# Patient Record
Sex: Female | Born: 1955
Health system: Southern US, Community
[De-identification: ages and names within clinical notes are randomized; demographics above are authoritative.]

## PROBLEM LIST (undated history)

## (undated) DIAGNOSIS — K449 Diaphragmatic hernia without obstruction or gangrene: Secondary | ICD-10-CM

## (undated) DIAGNOSIS — M1990 Unspecified osteoarthritis, unspecified site: Secondary | ICD-10-CM

## (undated) DIAGNOSIS — K219 Gastro-esophageal reflux disease without esophagitis: Secondary | ICD-10-CM

## (undated) DIAGNOSIS — T7840XA Allergy, unspecified, initial encounter: Secondary | ICD-10-CM

## (undated) DIAGNOSIS — K279 Peptic ulcer, site unspecified, unspecified as acute or chronic, without hemorrhage or perforation: Secondary | ICD-10-CM

## (undated) DIAGNOSIS — I10 Essential (primary) hypertension: Secondary | ICD-10-CM

## (undated) DIAGNOSIS — K7689 Other specified diseases of liver: Secondary | ICD-10-CM

## (undated) DIAGNOSIS — K284 Chronic or unspecified gastrojejunal ulcer with hemorrhage: Secondary | ICD-10-CM

## (undated) DIAGNOSIS — N281 Cyst of kidney, acquired: Secondary | ICD-10-CM

## (undated) DIAGNOSIS — R519 Headache, unspecified: Secondary | ICD-10-CM

## (undated) DIAGNOSIS — D649 Anemia, unspecified: Secondary | ICD-10-CM

## (undated) DIAGNOSIS — M792 Neuralgia and neuritis, unspecified: Secondary | ICD-10-CM

## (undated) DIAGNOSIS — H269 Unspecified cataract: Secondary | ICD-10-CM

## (undated) DIAGNOSIS — R112 Nausea with vomiting, unspecified: Secondary | ICD-10-CM

## (undated) DIAGNOSIS — E7800 Pure hypercholesterolemia, unspecified: Secondary | ICD-10-CM

## (undated) DIAGNOSIS — J302 Other seasonal allergic rhinitis: Secondary | ICD-10-CM

## (undated) DIAGNOSIS — Z9889 Other specified postprocedural states: Secondary | ICD-10-CM

## (undated) DIAGNOSIS — R0600 Dyspnea, unspecified: Secondary | ICD-10-CM

## (undated) DIAGNOSIS — M810 Age-related osteoporosis without current pathological fracture: Secondary | ICD-10-CM

## (undated) DIAGNOSIS — K274 Chronic or unspecified peptic ulcer, site unspecified, with hemorrhage: Secondary | ICD-10-CM

## (undated) DIAGNOSIS — D72829 Elevated white blood cell count, unspecified: Secondary | ICD-10-CM

## (undated) HISTORY — DX: Unspecified cataract: H26.9

## (undated) HISTORY — DX: Gastro-esophageal reflux disease without esophagitis: K21.9

## (undated) HISTORY — DX: Pure hypercholesterolemia, unspecified: E78.00

## (undated) HISTORY — DX: Other seasonal allergic rhinitis: J30.2

## (undated) HISTORY — PX: WISDOM TOOTH EXTRACTION: SHX21

## (undated) HISTORY — PX: FRACTURE SURGERY: SHX138

## (undated) HISTORY — DX: Allergy, unspecified, initial encounter: T78.40XA

## (undated) HISTORY — DX: Chronic or unspecified gastrojejunal ulcer with hemorrhage: K28.4

## (undated) HISTORY — DX: Neuralgia and neuritis, unspecified: M79.2

## (undated) HISTORY — DX: Peptic ulcer, site unspecified, unspecified as acute or chronic, without hemorrhage or perforation: K27.9

## (undated) HISTORY — DX: Cyst of kidney, acquired: N28.1

## (undated) HISTORY — DX: Unspecified osteoarthritis, unspecified site: M19.90

## (undated) HISTORY — DX: Essential (primary) hypertension: I10

## (undated) HISTORY — DX: Other specified diseases of liver: K76.89

## (undated) HISTORY — DX: Chronic or unspecified peptic ulcer, site unspecified, with hemorrhage: K27.4

## (undated) HISTORY — DX: Diaphragmatic hernia without obstruction or gangrene: K44.9

## (undated) HISTORY — DX: Age-related osteoporosis without current pathological fracture: M81.0

---
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
## 1979-08-03 HISTORY — PX: LAPAROSCOPY: SHX197

## 1990-08-02 HISTORY — PX: APPENDECTOMY: SHX54

## 1990-08-02 HISTORY — PX: TOTAL ABDOMINAL HYSTERECTOMY: SHX209

## 2000-06-12 HISTORY — PX: TIBIA FRACTURE SURGERY: SHX806

## 2000-11-23 HISTORY — PX: LEG SURGERY: SHX1003

## 2001-04-12 HISTORY — PX: KNEE ARTHROSCOPY: SUR90

## 2001-08-02 HISTORY — PX: BREAST CYST ASPIRATION: SHX578

## 2002-02-08 HISTORY — PX: LEG SURGERY: SHX1003

## 2003-01-29 HISTORY — PX: MENISCUS REPAIR: SHX5179

## 2012-08-02 HISTORY — PX: COLONOSCOPY: SHX174

## 2016-09-10 ENCOUNTER — Encounter: Payer: Self-pay | Admitting: Physician Assistant

## 2016-09-16 ENCOUNTER — Encounter: Payer: Self-pay | Admitting: Physician Assistant

## 2016-09-16 ENCOUNTER — Ambulatory Visit (INDEPENDENT_AMBULATORY_CARE_PROVIDER_SITE_OTHER): Payer: Self-pay | Admitting: Physician Assistant

## 2016-09-16 VITALS — BP 128/68 | HR 74 | Ht 67.0 in | Wt 169.0 lb

## 2016-09-16 DIAGNOSIS — R935 Abnormal findings on diagnostic imaging of other abdominal regions, including retroperitoneum: Secondary | ICD-10-CM

## 2016-09-16 DIAGNOSIS — Z8711 Personal history of peptic ulcer disease: Secondary | ICD-10-CM

## 2016-09-16 DIAGNOSIS — K219 Gastro-esophageal reflux disease without esophagitis: Secondary | ICD-10-CM

## 2016-09-16 DIAGNOSIS — R1310 Dysphagia, unspecified: Secondary | ICD-10-CM

## 2016-09-16 MED ORDER — OMEPRAZOLE 40 MG PO CPDR: 40.0000 mg | DELAYED_RELEASE_CAPSULE | Freq: Every day | ORAL | 5 refills | Status: DC

## 2016-09-16 NOTE — Patient Instructions (Signed)
You have been scheduled for an endoscopy. Please follow written instructions given to you at your visit today. If you use inhalers (even only as needed), please bring them with you on the day of your procedure. Your physician has requested that you go to www.startemmi.com and enter the access code given to you at your visit today. This web site gives a general overview about your procedure. However, you should still follow specific instructions given to you by our office regarding your preparation for the procedure.  We have sent the following medications to your pharmacy for you to pick up at your convenience: Omeprazole 40 mg daily.   We have given you an anti rflux handout.

## 2016-09-16 NOTE — Progress Notes (Addendum)
Chief Complaint: Abnormal Ct abdomen, Abdominal pain, H/o ulcers  HPI:  Tracy Mckenzie is a 61 year old Caucasian female who presents to clinic today accompanied by her husband with a past medical history of arthritis, hiatal hernia and stomach ulcers, who was referred to our office after recently being seen in the ED for complaint of abdominal pain and a finding of duodenal ulcers.     Per review of chart patient was recently seen at [facility] on 09/09/16 for complaint of abdominal pain. At that time she reported left lower quadrant abdominal pain and nausea and vomiting. She had labs which showed a white blood cell count elevated at 18.3 and was otherwise normal and a CMP within normal limits. She did have a CT abdomen pelvis at that time which showed wall thickening and surrounding fat stranding involving the first and second portion of the duodenum as well as a small hiatal hernia which was suggestive of duodenitis/peptic ulcer disease. Also finding of the left hepatic cyst.   Today, the patient explains that in July of last year she was told to try and titrate off of her Omeprazole, so she started to do so, she was using 40 mg daily, and as she started titrated she had an increase in reflux symptoms. She was told by someone that it was bad for her to be on a PPI, she tells me this was one of her doctors, she was "eating Tums like candy", but remained off of this medication. She then developed acute abdominal pain in February as above. Patient tells me she went to the ER and was diagnosed with an ulcer. She does tell me she has history of the same back in 2011. Patient does not remember having an EGD but is "sure I had one to diagnose that". Currently the patient has been on Carafate 3 times a day, famotidine 20 mg twice a day and omeprazole 40 mg daily and has had no further abdominal pain or symptoms.   She also tells me today that she has symptoms of dysphagia which occur "more than I want to  say". Her husband does snicker to the side of her when I asked how often this occurs.    Patient's social history is positive for a lot of stress recently with deaths in her family over the past few months.   Patient denies fever, chills, blood in her stool, melena, weight loss, fatigue, anorexia, change in bowel habits, nausea, vomiting or symptoms that awaken her at night.  Past Medical History:  Diagnosis Date  . Arthritis   . Elevated cholesterol   . Hiatal hernia   . High blood pressure   . History of stomach ulcers     Past Surgical History:  Procedure Laterality Date  . APPENDECTOMY  1992  . HYSTERECTOMY ABDOMINAL WITH SALPINGECTOMY  1992  . LEG SURGERY Right 2001   Multiple surgeries    Current Outpatient Prescriptions  Medication Sig Dispense Refill  . omeprazole (PRILOSEC) 40 MG capsule Take 1 capsule (40 mg total) by mouth daily. 30 mins before breakfast 30 capsule 5   No current facility-administered medications for this visit.     Allergies as of 09/16/2016 - never reviewed  Allergen Reaction Noted  . Percocet [oxycodone-acetaminophen] Itching 09/16/2016  . Vicodin [hydrocodone-acetaminophen] Itching 09/16/2016  . Demerol [meperidine] Palpitations 09/16/2016    Family History: No family history of colon cancer  Social History   Social History  . Marital status: Married  Spouse name: N/A  . Number of children: N/A  . Years of education: N/A   Occupational History  . Not on file.   Social History Main Topics  . Smoking status: Never Smoker  . Smokeless tobacco: Never Used  . Alcohol use Not on file  . Drug use: Unknown  . Sexual activity: Not on file   Other Topics Concern  . Not on file   Social History Narrative  . No narrative on file    Review of Systems:    Constitutional: No weight loss, fever, chills, weakness or fatigue Skin: No rash  Cardiovascular: No chest pain   Respiratory: No SOB  Gastrointestinal: See HPI and otherwise  negative Genitourinary: No dysuria or change in urinary frequency Neurological: No headache, dizziness or syncope Musculoskeletal: Positive for arthritis, muscle cramps and back pain Hematologic: No bleeding or bruising Psychiatric: No history of depression or anxiety   Physical Exam:  Vital signs: BP 128/68   Pulse 74   Ht 5\' 7"  (1.702 m)   Wt 169 lb (76.7 kg)   BMI 26.47 kg/m   Constitutional:   Pleasant Caucasian female appears to be in NAD, Well developed, Well nourished, alert and cooperative Head:  Normocephalic and atraumatic. Eyes:   PEERL, EOMI. No icterus. Conjunctiva pink. Ears:  Normal auditory acuity. Neck:  Supple Throat: Oral cavity and pharynx without inflammation, swelling or lesion.  Respiratory: Respirations even and unlabored. Lungs clear to auscultation bilaterally.   No wheezes, crackles, or rhonchi.  Cardiovascular: Normal S1, S2. No MRG. Regular rate and rhythm. No peripheral edema, cyanosis or pallor.  Gastrointestinal:  Soft, nondistended, nontender. No rebound or guarding. Normal bowel sounds. No appreciable masses or hepatomegaly. Rectal:  Not performed.  Msk:  Symmetrical without gross deformities. Without edema, no deformity or joint abnormality.  Neurologic:  Alert and  oriented x4;  grossly normal neurologically.  Skin:   Dry and intact without significant lesions or rashes. Psychiatric:Demonstrates good judgement and reason without abnormal affect or behaviors.  Assessment: 1. Abnormal CT of the abdomen: Completed 09/09/16 showing duodenitis/peptic ulcer disease with inflammatory changes of the first and second portion of the duodenum, left hepatic cyst and a small hiatal hernia, patient was having abdominal pain at that time which has gotten better with Famotidine 20 mg twice a day, Omeprazole 20 mg daily and Sucralfate 3 times a day 2. GERD: Patient does have symptoms if not on maintenance medication 3. History of ulcers: Patient was diagnosed in  2011 with bleeding ulcers 4. Dysphagia: This occurs more than the patient will tell me as her husband snickers when I asked how often this occurs, patient notes that she feels like things "just get stuck", or "go down the wrong hole", a lot; consider esophageal ring versus web versus mass versus stricture versus abnormal motility versus other  Plan: 1. Scheduled patient for an EGD with possible dilation in the [place] with  Dr. Loletha Carrow, as he is the supervising physician this afternoon. Did discuss risks, benefits, limitations and alternatives of the procedure and the patient agrees to proceed. 2. Recommend the patient stay on Omeprazole 40 mg daily, 30-60 minutes before eating breakfast. 3. Reviewed antireflux diet and lifestyle modifications. Provide the patient a handout 4. Recommend patient continue Carafate for the full prescription, no need to refill this, we did discuss timing of this one hour before or 2 hours after eating and other medications. 5. Patient may continue famotidine twice daily for now, but can also discontinue this  when she runs out at the end of the month 6. Patient to follow in clinic per recommendations from Dr. Loletha Carrow after time of procedure  Tracy Newer, PA-C [facility] 09/16/2016, 3:59 PM  Cc: No ref. provider found   Thank you for sending this case to me. I have reviewed the entire note, and the outlined plan seems appropriate.   Wilfrid Lund, MD

## 2016-10-04 ENCOUNTER — Encounter: Payer: Self-pay | Admitting: Gastroenterology

## 2016-10-04 ENCOUNTER — Ambulatory Visit (AMBULATORY_SURGERY_CENTER): Payer: Self-pay | Admitting: Gastroenterology

## 2016-10-04 VITALS — BP 129/71 | HR 68 | Temp 98.4°F | Resp 12 | Ht 67.0 in | Wt 169.0 lb

## 2016-10-04 DIAGNOSIS — K259 Gastric ulcer, unspecified as acute or chronic, without hemorrhage or perforation: Secondary | ICD-10-CM

## 2016-10-04 DIAGNOSIS — K2950 Unspecified chronic gastritis without bleeding: Secondary | ICD-10-CM

## 2016-10-04 DIAGNOSIS — R933 Abnormal findings on diagnostic imaging of other parts of digestive tract: Secondary | ICD-10-CM

## 2016-10-04 DIAGNOSIS — R1310 Dysphagia, unspecified: Secondary | ICD-10-CM

## 2016-10-04 MED ORDER — SODIUM CHLORIDE 0.9 % IV SOLN: 500.0000 mL | INTRAVENOUS | Status: DC

## 2016-10-04 NOTE — Progress Notes (Signed)
Report given to PACU, vss 

## 2016-10-04 NOTE — Progress Notes (Signed)
Called to room to assist during endoscopic procedure.  Patient ID and intended procedure confirmed with present staff. Received instructions for my participation in the procedure from the performing physician.  

## 2016-10-04 NOTE — Patient Instructions (Signed)
YOU HAD AN ENDOSCOPIC PROCEDURE TODAY AT [facility]:   Refer to the procedure report that was given to you for any specific questions about what was found during the examination.  If the procedure report does not answer your questions, please call your gastroenterologist to clarify.  If you requested that your care partner not be given the details of your procedure findings, then the procedure report has been included in a sealed envelope for you to review at your convenience later.  YOU SHOULD EXPECT: Some feelings of bloating in the abdomen. Passage of more gas than usual.  Walking can help get rid of the air that was put into your GI tract during the procedure and reduce the bloating. If you had a lower endoscopy (such as a colonoscopy or flexible sigmoidoscopy) you may notice spotting of blood in your stool or on the toilet paper. If you underwent a bowel prep for your procedure, you may not have a normal bowel movement for a few days.  Please Note:  You might notice some irritation and congestion in your nose or some drainage.  This is from the oxygen used during your procedure.  There is no need for concern and it should clear up in a day or so.  SYMPTOMS TO REPORT IMMEDIATELY:     Following upper endoscopy (EGD)  Vomiting of blood or coffee ground material  New chest pain or pain under the shoulder blades  Painful or persistently difficult swallowing  New shortness of breath  Fever of 100F or higher  Black, tarry-looking stools  For urgent or emergent issues, a gastroenterologist can be reached at any hour by calling [phone].   DIET:  We do recommend a small meal at first, but then you may proceed to your regular diet.  Drink plenty of fluids but you should avoid alcoholic beverages for 24 hours.  ACTIVITY:  You should plan to take it easy for the rest of today and you should NOT DRIVE or use heavy machinery until tomorrow (because of the sedation medicines  used during the test).    FOLLOW UP: Our staff will call the number listed on your records the next business day following your procedure to check on you and address any questions or concerns that you may have regarding the information given to you following your procedure. If we do not reach you, we will leave a message.  However, if you are feeling well and you are not experiencing any problems, there is no need to return our call.  We will assume that you have returned to your regular daily activities without incident.  If any biopsies were taken you will be contacted by phone or by letter within the next 1-3 weeks.  Please call us at [phone] if you have not heard about the biopsies in 3 weeks.    SIGNATURES/CONFIDENTIALITY: You and/or your care partner have signed paperwork which will be entered into your electronic medical record.  These signatures attest to the fact that that the information above on your After Visit Summary has been reviewed and is understood.  Full responsibility of the confidentiality of this discharge information lies with you and/or your care-partner.   Resume medications. Information given on Hiatal Hernia.

## 2016-10-04 NOTE — Op Note (Signed)
[facility] Patient Name: Tracy Mckenzie Procedure Date: 10/04/2016 9:59 AM MRN: [id number] Endoscopist: Mallie Mussel L. Loletha Carrow , MD Age: 61 Referring MD:  Date of Birth: Jan 23, 1956 Gender: Female Account #: [id number] Procedure:                Upper GI endoscopy Indications:              Dysphagia, Heartburn, Suspected chronic duodenal                            ulcer with obstruction (based on recent CT scan                            findings) Medicines:                Monitored Anesthesia Care Procedure:                Pre-Anesthesia Assessment:                           - Prior to the procedure, a History and Physical                            was performed, and patient medications and                            allergies were reviewed. The patient's tolerance of                            previous anesthesia was also reviewed. The risks                            and benefits of the procedure and the sedation                            options and risks were discussed with the patient.                            All questions were answered, and informed consent                            was obtained. Prior Anticoagulants: The patient has                            taken no previous anticoagulant or antiplatelet                            agents. ASA Grade Assessment: II - A patient with                            mild systemic disease. After reviewing the risks                            and benefits, the patient was deemed in  satisfactory condition to undergo the procedure.                           After obtaining informed consent, the endoscope was                            passed under direct vision. Throughout the                            procedure, the patient's blood pressure, pulse, and                            oxygen saturations were monitored continuously. The                            Model GIF-HQ190 [id number]) scope was introduced                             through the mouth, and advanced to the second part                            of duodenum. The upper GI endoscopy was                            accomplished without difficulty. The patient                            tolerated the procedure well. Scope In: Scope Out: Findings:                 The larynx was normal.                           A medium-sized hiatal hernia was present. No                            stricture, web or other stenosis was seen.                           Two non-bleeding superficial gastric ulcers were                            found in the prepyloric region of the stomach. The                            largest lesion was 2 mm in largest dimension.                            Several biopsies were obtained in the gastric body                            and in the gastric antrum with cold forceps for                            histology.  The cardia and gastric fundus were normal on                            retroflexion.                           A mild post-ulcer deformity was found in the                            duodenal bulb. Complications:            No immediate complications. Estimated Blood Loss:     Estimated blood loss: none. Impression:               - Normal larynx.                           - Medium-sized hiatal hernia.                           - Non-bleeding gastric ulcers.                           - Duodenal deformity.                           - Several biopsies were obtained in the gastric                            body and in the gastric antrum. Recommendation:           - Patient has a contact number available for                            emergencies. The signs and symptoms of potential                            delayed complications were discussed with the                            patient. Return to normal activities tomorrow.                            Written discharge  instructions were provided to the                            patient.                           - Resume previous diet.                           - Continue present medications.                           - Await pathology results.                           - Follow up  at office in 3-4 weeks. Vielka Klinedinst L. Loletha Carrow, MD 10/04/2016 10:27:41 AM This report has been signed electronically.

## 2016-10-05 ENCOUNTER — Telehealth: Payer: Self-pay | Admitting: *Deleted

## 2016-10-05 NOTE — Telephone Encounter (Signed)
  Follow up Call-  Call back number 10/04/2016  Post procedure Call Back phone  # [phone]  Permission to leave phone message No     Patient questions:  Do you have a fever, pain , or abdominal swelling? No. Pain Score  0 *  Have you tolerated food without any problems? Yes.    Have you been able to return to your normal activities? Yes.    Do you have any questions about your discharge instructions: Diet   No. Medications  No. Follow up visit  No.  Do you have questions or concerns about your Care? Yes.    Actions: * If pain score is 4 or above: No action needed, pain <4.

## 2016-10-08 ENCOUNTER — Encounter: Payer: Self-pay | Admitting: Gastroenterology

## 2016-10-28 ENCOUNTER — Encounter: Payer: Self-pay | Admitting: Gastroenterology

## 2016-10-28 ENCOUNTER — Ambulatory Visit (INDEPENDENT_AMBULATORY_CARE_PROVIDER_SITE_OTHER): Payer: Self-pay | Admitting: Gastroenterology

## 2016-10-28 ENCOUNTER — Encounter (INDEPENDENT_AMBULATORY_CARE_PROVIDER_SITE_OTHER): Payer: Self-pay

## 2016-10-28 VITALS — BP 108/60 | HR 80 | Ht 66.0 in | Wt 164.5 lb

## 2016-10-28 DIAGNOSIS — K449 Diaphragmatic hernia without obstruction or gangrene: Secondary | ICD-10-CM

## 2016-10-28 DIAGNOSIS — K219 Gastro-esophageal reflux disease without esophagitis: Secondary | ICD-10-CM

## 2016-10-28 DIAGNOSIS — K259 Gastric ulcer, unspecified as acute or chronic, without hemorrhage or perforation: Secondary | ICD-10-CM

## 2016-10-28 DIAGNOSIS — T39395A Adverse effect of other nonsteroidal anti-inflammatory drugs [NSAID], initial encounter: Secondary | ICD-10-CM

## 2016-10-28 NOTE — Patient Instructions (Signed)
If you are age 63 or older, your body mass index should be between 23-30. Your Body mass index is 26.55 kg/m. If this is out of the aforementioned range listed, please consider follow up with your Primary Care Provider.  If you are age 52 or younger, your body mass index should be between 19-25. Your Body mass index is 26.55 kg/m. If this is out of the aformentioned range listed, please consider follow up with your Primary Care Provider.   Thank you for choosing [facility]  Dr Wilfrid Lund III

## 2016-10-28 NOTE — Progress Notes (Signed)
     [facility] Progress Note  Chief Complaint: GERD and gastric ulcer  Subjective  History:  Tracy Mckenzie follows up after her upper endoscopy. She had years of frequent GERD symptoms requiring twice daily PPI. There is an episode of acute lower abdominal pain prompting at trip to the ED. CT scan suggested perhaps inflammation or ulcer in the second portion of the duodenum. She was taking meloxicam more frequent, continues to do so less frequently now. EGD showed nonerosive reflux, small hiatal hernia, and 2 diminutive prepyloric ulcers. There was also evidence of a previous ulcer in the duodenal bulb with mucosal scarring. She is feeling well at this point with no abdominal pain. She has been taking omeprazole 40 mg each evening to prevent nocturnal pyrosis. She still feels that she needs a 20 mg OTC omeprazole dose in the morning to control daytime symptoms much of the time. She denies dysphagia, nausea, vomiting early satiety or weight loss.  ROS: Cardiovascular:  no chest pain Respiratory: no dyspnea  The patient's Past Medical, Family and Social History were reviewed and are on file in the EMR.  Objective:  Med list reviewed  Vital signs in last 24 hrs: Vitals:   10/28/16 0836  BP: 108/60  Pulse: 80    Physical Exam    HEENT: sclera anicteric, oral mucosa moist without lesions  Neck: supple, no thyromegaly, JVD or lymphadenopathy  Cardiac: RRR without murmurs, S1S2 heard, no peripheral edema  Pulm: clear to auscultation bilaterally, normal RR and effort noted  Abdomen: soft, no tenderness, with active bowel sounds. No guarding or palpable hepatosplenomegaly.  Skin; warm and dry, no jaundice or rash  Recent Labs:  Bx negative for H. pylori   @ASSESSMENTPLANBEGIN @ Assessment: Encounter Diagnoses  Name Primary?  . Gastric ulcer due to nonsteroidal anti-inflammatory drug (NSAID) Yes  . Gastroesophageal reflux disease without esophagitis   . Hiatal hernia      I do not know how far into the past she truly had a duodenal ulcer, but at this point it has clearly healed. The small prepyloric ulcers are of little or no clinical significance at present. However, I still think she should minimize NSAID use. She feels that she needs a daily to be functional with her osteoarthritis. I asked her to discuss with primary care the possibility medicine such as Tylenol No. 3 to take perhaps on alternate days in order to minimize her NSAID use.  Plan: She will continue current dosing of PPI, no further testing is needed and she will see me as needed.  Total time 15 minutes, over half spent in counseling and coordinate and care. We discussed the nature of GERD, the use and limitations of medicines, nonpharmacologic antireflux measures, and the effect of NSAIDs on upper digestive ulcers.  Tracy Mckenzie

## 2017-03-20 ENCOUNTER — Other Ambulatory Visit: Payer: Self-pay | Admitting: Physician Assistant

## 2017-05-03 ENCOUNTER — Other Ambulatory Visit: Payer: Self-pay | Admitting: Physician Assistant

## 2017-06-01 ENCOUNTER — Other Ambulatory Visit: Payer: Self-pay | Admitting: Physician Assistant

## 2017-09-27 LAB — CBC AND DIFFERENTIAL
HCT: 38 (ref 36–46)
Hemoglobin: 12.4 (ref 12.0–16.0)
Platelets: 363 (ref 150–399)

## 2017-09-27 LAB — TSH: TSH: 1.83 (ref <=5.90)

## 2017-09-27 LAB — LIPID PANEL
Cholesterol: 232 — AB (ref 0–200)
HDL: 53 (ref 35–70)
Triglycerides: 122 (ref 40–160)

## 2017-09-29 LAB — LIPID PANEL: LDL Cholesterol: 155

## 2017-09-29 LAB — CBC AND DIFFERENTIAL: WBC: 13.2

## 2019-03-06 DIAGNOSIS — S9031XA Contusion of right foot, initial encounter: Secondary | ICD-10-CM | POA: Diagnosis not present

## 2019-05-10 DIAGNOSIS — R69 Illness, unspecified: Secondary | ICD-10-CM | POA: Diagnosis not present

## 2019-07-06 ENCOUNTER — Telehealth: Payer: Self-pay | Admitting: Gastroenterology

## 2019-07-06 NOTE — Telephone Encounter (Signed)
Dr. Loletha Carrow, this pt was last seen in 2018.  She called to schedule a colonoscopy.  Previous colonoscopy records from [facility] 10/24/2012 are in [facility] for review.  Please review and advise when she is due.

## 2019-07-09 NOTE — Telephone Encounter (Signed)
Patient will sign medical release form to obtain records.

## 2019-07-09 NOTE — Telephone Encounter (Signed)
All I can see in Care Everywhere from March 2014 is a pathology report showing hyperplastic polyp.  I would need the full colonoscopy procedure report to know that it was a complete exam with a good preparation in order to recommend colonoscopy interval.  If that report can be found and printed for my review, I will be able to do so.

## 2019-07-10 ENCOUNTER — Telehealth: Payer: Self-pay

## 2019-07-10 NOTE — Telephone Encounter (Signed)
Medical records requested by [facility] from [facility]. 07/10/19 vlm

## 2019-07-11 DIAGNOSIS — R69 Illness, unspecified: Secondary | ICD-10-CM | POA: Diagnosis not present

## 2019-07-20 NOTE — Telephone Encounter (Signed)
Colonoscopy report received and will be sent to Dr. Loletha Carrow for review.

## 2019-07-23 NOTE — Telephone Encounter (Signed)
(   for documentation purposes -colonoscopy report dated 06/22/2005 by Dr. Jeanice Lim Complete exam to ileum, excellent preparation.  3 mm transverse colon polyp removed, however no pathology report.  Colonoscopy 10/24/2012 by Dr. Bebe Liter Complete exam to cecum, excellent preparation.  7 mm ascending colon polyp pathology report showing hyperplastic polyp.) ______________________________________________________________________ I reviewed the reports including complete colonoscopy and pathology report from March 2014.  The type of polyp removed on that exam was reportedly hyperplastic, which is not the type considered to be precancerous.  The recommendation in that case would typically be for repeat colonoscopy at a 10-year interval.  However, it is uncommon to see that type of polyp in the part of the colon where it was removed.  Sometimes a precancerous type of polyp called a serrated polyp, which is common in that part of the colon, can look similar under the microscope and therefore be mistaken for hyperplastic polyp.  All that said, I therefore recommend a surveillance colonoscopy when she feels ready to do so.  If she would like to proceed with that time soon, please forward this to [facility] scheduling.   - HD

## 2019-07-23 NOTE — Telephone Encounter (Signed)
Relayed Dr. Corena Pilgrim recommendation to patient.  She is currently driving and will call back to schedule colonoscopy.

## 2019-08-10 ENCOUNTER — Other Ambulatory Visit: Payer: Self-pay

## 2019-08-13 ENCOUNTER — Ambulatory Visit (INDEPENDENT_AMBULATORY_CARE_PROVIDER_SITE_OTHER): Payer: Medicare HMO | Admitting: Family Medicine

## 2019-08-13 ENCOUNTER — Encounter: Payer: Self-pay | Admitting: Family Medicine

## 2019-08-13 VITALS — BP 144/78 | HR 91 | Temp 97.6°F | Ht 66.0 in | Wt 180.2 lb

## 2019-08-13 DIAGNOSIS — R790 Abnormal level of blood mineral: Secondary | ICD-10-CM | POA: Diagnosis not present

## 2019-08-13 DIAGNOSIS — M1731 Unilateral post-traumatic osteoarthritis, right knee: Secondary | ICD-10-CM | POA: Insufficient documentation

## 2019-08-13 DIAGNOSIS — M8580 Other specified disorders of bone density and structure, unspecified site: Secondary | ICD-10-CM | POA: Diagnosis not present

## 2019-08-13 DIAGNOSIS — I10 Essential (primary) hypertension: Secondary | ICD-10-CM | POA: Diagnosis not present

## 2019-08-13 DIAGNOSIS — M545 Low back pain, unspecified: Secondary | ICD-10-CM | POA: Insufficient documentation

## 2019-08-13 DIAGNOSIS — Z114 Encounter for screening for human immunodeficiency virus [HIV]: Secondary | ICD-10-CM

## 2019-08-13 DIAGNOSIS — R69 Illness, unspecified: Secondary | ICD-10-CM | POA: Diagnosis not present

## 2019-08-13 DIAGNOSIS — Z1159 Encounter for screening for other viral diseases: Secondary | ICD-10-CM

## 2019-08-13 DIAGNOSIS — M810 Age-related osteoporosis without current pathological fracture: Secondary | ICD-10-CM | POA: Insufficient documentation

## 2019-08-13 DIAGNOSIS — K449 Diaphragmatic hernia without obstruction or gangrene: Secondary | ICD-10-CM | POA: Insufficient documentation

## 2019-08-13 DIAGNOSIS — G8929 Other chronic pain: Secondary | ICD-10-CM | POA: Diagnosis not present

## 2019-08-13 DIAGNOSIS — Z1231 Encounter for screening mammogram for malignant neoplasm of breast: Secondary | ICD-10-CM | POA: Diagnosis not present

## 2019-08-13 DIAGNOSIS — K219 Gastro-esophageal reflux disease without esophagitis: Secondary | ICD-10-CM | POA: Insufficient documentation

## 2019-08-13 DIAGNOSIS — Z0000 Encounter for general adult medical examination without abnormal findings: Secondary | ICD-10-CM | POA: Diagnosis not present

## 2019-08-13 LAB — MAGNESIUM: Magnesium: 2.2 mg/dL (ref 1.5–2.5)

## 2019-08-13 LAB — LIPID PANEL
Cholesterol: 224 mg/dL — ABNORMAL HIGH (ref 0–200)
HDL: 56.4 mg/dL (ref >39.00)
LDL Cholesterol: 151 mg/dL — ABNORMAL HIGH (ref 0–99)
NonHDL: 168.07
Total CHOL/HDL Ratio: 4
Triglycerides: 85 mg/dL (ref 0.0–149.0)
VLDL: 17 mg/dL (ref 0.0–40.0)

## 2019-08-13 LAB — CBC WITH DIFFERENTIAL/PLATELET
Basophils Absolute: 0.1 10*3/uL (ref 0.0–0.1)
Basophils Relative: 0.4 % (ref 0.0–3.0)
Eosinophils Absolute: 0.3 10*3/uL (ref 0.0–0.7)
Eosinophils Relative: 2.2 % (ref 0.0–5.0)
HCT: 39.1 % (ref 36.0–46.0)
Hemoglobin: 13 g/dL (ref 12.0–15.0)
Lymphocytes Relative: 16 % (ref 12.0–46.0)
Lymphs Abs: 2.3 10*3/uL (ref 0.7–4.0)
MCHC: 33.2 g/dL (ref 30.0–36.0)
MCV: 87.8 fl (ref 78.0–100.0)
Monocytes Absolute: 0.8 10*3/uL (ref 0.1–1.0)
Monocytes Relative: 5.6 % (ref 3.0–12.0)
Neutro Abs: 10.8 10*3/uL — ABNORMAL HIGH (ref 1.4–7.7)
Neutrophils Relative %: 75.8 % (ref 43.0–77.0)
Platelets: 406 10*3/uL — ABNORMAL HIGH (ref 150.0–400.0)
RBC: 4.46 Mil/uL (ref 3.87–5.11)
RDW: 14.2 % (ref 11.5–15.5)
WBC: 14.2 10*3/uL — ABNORMAL HIGH (ref 4.0–10.5)

## 2019-08-13 LAB — MICROALBUMIN / CREATININE URINE RATIO
Creatinine,U: 36.4 mg/dL
Microalb Creat Ratio: 1.9 mg/g (ref 0.0–30.0)
Microalb, Ur: <0.7 mg/dL (ref 0.0–1.9)

## 2019-08-13 LAB — COMPREHENSIVE METABOLIC PANEL
ALT: 15 U/L (ref 0–35)
AST: 16 U/L (ref 0–37)
Albumin: 4.5 g/dL (ref 3.5–5.2)
Alkaline Phosphatase: 159 U/L — ABNORMAL HIGH (ref 39–117)
BUN: 16 mg/dL (ref 6–23)
CO2: 30 mEq/L (ref 19–32)
Calcium: 10.2 mg/dL (ref 8.4–10.5)
Chloride: 102 mEq/L (ref 96–112)
Creatinine, Ser: 0.98 mg/dL (ref 0.40–1.20)
GFR: 56.85 mL/min — ABNORMAL LOW (ref >60.00)
Glucose, Bld: 98 mg/dL (ref 70–99)
Potassium: 4.2 mEq/L (ref 3.5–5.1)
Sodium: 143 mEq/L (ref 135–145)
Total Bilirubin: 0.3 mg/dL (ref 0.2–1.2)
Total Protein: 7.2 g/dL (ref 6.0–8.3)

## 2019-08-13 LAB — TSH: TSH: 2.4 u[IU]/mL (ref 0.35–4.50)

## 2019-08-13 MED ORDER — OMEPRAZOLE 40 MG PO CPDR: DELAYED_RELEASE_CAPSULE | ORAL | 3 refills | Status: DC

## 2019-08-13 MED ORDER — CYCLOBENZAPRINE HCL 10 MG PO TABS: 10.0000 mg | ORAL_TABLET | Freq: Three times a day (TID) | ORAL | 0 refills | Status: DC | PRN

## 2019-08-13 MED ORDER — TRAMADOL-ACETAMINOPHEN 37.5-325 MG PO TABS: 1.0000 | ORAL_TABLET | Freq: Three times a day (TID) | ORAL | 0 refills | Status: DC | PRN

## 2019-08-13 MED ORDER — HYDROCHLOROTHIAZIDE 12.5 MG PO CAPS: 12.5000 mg | ORAL_CAPSULE | Freq: Every day | ORAL | 3 refills | Status: DC

## 2019-08-13 MED ORDER — LOSARTAN POTASSIUM 100 MG PO TABS: 100.0000 mg | ORAL_TABLET | Freq: Every day | ORAL | 3 refills | Status: DC

## 2019-08-13 NOTE — Progress Notes (Signed)
Phone: [phone]  Subjective:  Patient presents today for their Welcome to Medicare Exam, establishing care and chronic medical conditions.   Hypertension: Here for follow up of hypertension.  Currently on losartan and hctz . Home readings range from [id number] diastolic. Takes medication as prescribed and denies any side effects. Exercise includes none. Weight has been stable. Denies any chest pain, headaches, shortness of breath, vision changes, swelling in lower extremities.   GERD: hiatal hernia with EGD in 2018. No barrett's but per GI, lifelong PPI. Needing a refill of this. Symptoms well controlled.   Chronic right knee pain: trauma with hx of 4 surgeries. likely need TKR, but would like to try injections possibly if she can.   Chronic left sided back pain: was told by ortho she has bulging discs, but has never had a MRI just xrays. She states it will sometimes flair. Interested inf muscle relaxer, has never tried PT. No radicular symptoms or weakness.   Osteopenia: hx of fosamax x4 years. Stopped due to GERD. Last bone scan over 5 years ago per patient. No weight bearing activities. Exercise limited due to knee pain.   Preventive Screening-Counseling & Management  Vision screen: 20/25 both eyes, 20/25-R, 20/30-L  Hearing Screening   125Hz  250Hz  500Hz  1000Hz  2000Hz  3000Hz  4000Hz  6000Hz  8000Hz   Right ear:           Left ear:             Visual Acuity Screening   Right eye Left eye Both eyes  Without correction:     With correction: 20//25 20/30 20/25    Advanced directives: yes  Smoking Status: Never Smoker Second Hand Smoking status: No smokers in home  Risk Factors Regular exercise: no Diet: well balanaced, feels that she needs more vegetables  Fall Risk: None  No flowsheet data found. Opioid use history:  no long term opioids use. Uses prn tramadol for her back   Cardiac risk factors:  advanced age (older than 50 for men, 52 for women) yes Hyperlipidemia  yes No diabetes.  Family History: dad-hyperlipidemia   Depression Screen None. PHQ2 0 0 Depression screen PHQ 2/9 08/13/2019  Decreased Interest 0  Down, Depressed, Hopeless 0  PHQ - 2 Score 0    Activities of Daily Living Independent ADLs and IADLs needs some assistance from husband Hearing Difficulties: -patient declines  Cognitive Testing No reported trouble.  None   Normal 3 word recall  List the Names of Other Physician/Practitioners you currently use:   Immunization History  Administered Date(s) Administered  . Influenza, High Dose Seasonal PF 05/10/2019  . Influenza-Unspecified 05/30/2015, 08/01/2017  . Pneumococcal Polysaccharide-23 07/11/2019  . Tdap 05/02/2014  . Zoster 05/31/2015, 06/06/2015   Required Immunizations needed today: none. Will go to pharmacy for shingrix.   Screening tests- up to date Health Maintenance Due  Topic Date Due  . Hepatitis C Screening  06-23-1956  . HIV Screening  01/07/1969  . PAP SMEAR-Modifier  01/08/1975  . MAMMOGRAM  01/08/2004  . COLONOSCOPY  01/08/2004  . DEXA SCAN  01/08/2019    Review of Systems  Constitutional: Negative for chills, fever and malaise/fatigue.  HENT: Negative for hearing loss and sore throat.   Eyes: Negative for blurred vision and double vision.  Respiratory: Negative for cough, shortness of breath and wheezing.   Cardiovascular: Negative for chest pain, palpitations and leg swelling.  Gastrointestinal: Negative for abdominal pain, blood in stool, nausea and vomiting.  Genitourinary: Negative for dysuria and hematuria.  Musculoskeletal: Positive for back pain and joint pain (right knee pain ). Negative for falls.  Skin: Negative for rash.  Neurological: Negative for dizziness and weakness.  Psychiatric/Behavioral: Negative for memory loss and suicidal ideas. The patient is not nervous/anxious and does not have insomnia.      The following were reviewed and entered/updated in  epic: Past Medical History:  Diagnosis Date  . Arthritis   . Bleeding ulcer   . Elevated cholesterol   . Hiatal hernia   . High blood pressure   . Kidney cysts   . Liver cyst   . Nerve pain    L sciatic pain radiating to R  . Peptic ulcer disease    Patient Active Problem List   Diagnosis Date Noted  . Essential hypertension 08/13/2019  . GERD (gastroesophageal reflux disease) 08/13/2019  . Osteopenia 08/13/2019  . Post-traumatic osteoarthritis of right knee 08/13/2019  . Hiatal hernia 08/13/2019  . Chronic left-sided low back pain without sciatica 08/13/2019   Past Surgical History:  Procedure Laterality Date  . APPENDECTOMY  1992  . KNEE ARTHROSCOPY Right 04/12/2001   torn cartilage  . LAPAROSCOPY  1981   for endometriosis  . LEG SURGERY Right 11/23/2000   to remove screws  . LEG SURGERY Right 02/08/2002   to remove rod  . MENISCUS REPAIR  01/29/2003  . TIBIA FRACTURE SURGERY Right 06/12/2000   Multiple surgeries  . TONSILLECTOMY  1967  . TOTAL ABDOMINAL HYSTERECTOMY  1992    Family History  Problem Relation Age of Onset  . Kidney disease Mother   . COPD Mother   . Breast cancer Maternal Aunt   . Breast cancer Paternal Aunt   . COPD Father   . Diabetes Maternal Grandfather   . Stroke Paternal Grandfather   . Colon cancer Neg Hx     Medications- reviewed and updated Current Outpatient Medications  Medication Sig Dispense Refill  . AMBULATORY NON FORMULARY MEDICATION Sciatica Relief OTC Take 2-3 tabs by mouth as needed    . COCONUT OIL PO Take 1,000 mg by mouth daily.     . Coenzyme Q10 (CO Q-10) 100 MG CAPS Take 1 tablet by mouth daily.    . Cyanocobalamin (VITAMIN B-12) 5000 MCG TBDP Take 1 tablet by mouth daily.    . hydrochlorothiazide (MICROZIDE) 12.5 MG capsule Take 1 capsule (12.5 mg total) by mouth daily. 90 capsule 3  . levocetirizine (XYZAL) 5 MG tablet Take 5 mg by mouth daily.    Marland Kitchen lidocaine (LIDODERM) 5 % Place onto the skin as needed.      . magnesium 30 MG tablet Take 3 tablets by mouth daily.    . Misc Natural Products (GLUCOS-CHONDROIT-MSM COMPLEX PO) Take 2 tablets by mouth daily.    Marland Kitchen omeprazole (PRILOSEC) 40 MG capsule TAKE ONE CAPSULE BY MOUTH EVERY DAY 30 MINUTES BEFORE BREAkfast 90 capsule 3  . Potassium 99 MG TABS Take 1 tablet by mouth daily.    . traMADol-acetaminophen (ULTRACET) 37.5-325 MG tablet Take 1 tablet by mouth every 8 (eight) hours as needed. 30 tablet 0  . TURMERIC PO Take 2 tablets by mouth daily.    . cyclobenzaprine (FLEXERIL) 10 MG tablet Take 1 tablet (10 mg total) by mouth 3 (three) times daily as needed for muscle spasms. 30 tablet 0  . losartan (COZAAR) 100 MG tablet Take 1 tablet (100 mg total) by mouth daily. 90 tablet 3   Current Facility-Administered Medications  Medication Dose Route Frequency Provider Last  Rate Last Admin  . 0.9 %  sodium chloride infusion  500 mL Intravenous Continuous Doran Stabler, MD        Allergies-reviewed and updated Allergies  Allergen Reactions  . Meperidine Palpitations and Anaphylaxis    flushed  . Simvastatin     Other reaction(s): Muscle Pain  . Oxycodone-Acetaminophen Itching and Rash  . Vicodin [Hydrocodone-Acetaminophen] Itching    Social History   Socioeconomic History  . Marital status: Married    Spouse name: Not on file  . Number of children: Not on file  . Years of education: Not on file  . Highest education level: Not on file  Occupational History  . Not on file  Tobacco Use  . Smoking status: Never Smoker  . Smokeless tobacco: Never Used  Substance and Sexual Activity  . Alcohol use: No  . Drug use: No  . Sexual activity: Not on file  Other Topics Concern  . Not on file  Social History Narrative  . Not on file   Social Determinants of Health   Financial Resource Strain:   . Difficulty of Paying Living Expenses: Not on file  Food Insecurity:   . Worried About Charity fundraiser in the Last Year: Not on file  . Ran  Out of Food in the Last Year: Not on file  Transportation Needs:   . Lack of Transportation (Medical): Not on file  . Lack of Transportation (Non-Medical): Not on file  Physical Activity:   . Days of Exercise per Week: Not on file  . Minutes of Exercise per Session: Not on file  Stress:   . Feeling of Stress : Not on file  Social Connections:   . Frequency of Communication with Friends and Family: Not on file  . Frequency of Social Gatherings with Friends and Family: Not on file  . Attends Religious Services: Not on file  . Active Member of Clubs or Organizations: Not on file  . Attends Archivist Meetings: Not on file  . Marital Status: Not on file    Objective: BP (!) 144/78 (BP Location: Left Arm, Patient Position: Sitting, Cuff Size: Normal)   Pulse 91   Temp 97.6 F (36.4 C) (Temporal)   Ht 5\' 6"  (1.676 m)   Wt 180 lb 3.2 oz (81.7 kg)   SpO2 99%   BMI 29.09 kg/m  Gen: NAD, resting comfortably HEENT: Mucous membranes are moist. Oropharynx normal Neck: no thyromegaly CV: RRR no murmurs rubs or gallops Lungs: CTAB no crackles, wheeze, rhonchi Abdomen: soft/nontender/nondistended/normal bowel sounds. No rebound or guarding.  Ext: no edema Skin: warm, dry Neuro: grossly normal, moves all extremities, PERRLA. Gait unsteady due to right knee.   Assessment/Plan:  Welcome to Medicare exam completed- discussed recommended screenings anddocumented any personalized health advice and referrals for preventive counseling. See AVS as well which was given to patient.   Status of chronic or acute concerns  1. Welcome to Medicare preventive visit  - EKG 12-Lead  2. Encounter for hepatitis C screening test for low risk patient  - Hepatitis C antibody  3. Encounter for screening for HIV  - HIV antibody  4. Encounter for screening mammogram for breast cancer  - MM Digital Screening; Future  5. Essential hypertension Slightly above goal, but nearly there. Home  readings are to goal. im going to send in losartan 100mg /day instead of her taking 2 pills/day. Will continue with current treatment plan. Would like her to exercise, but very hard  with her knee. F/u in 6 months.  - CBC with Differential/Platelet - Comprehensive metabolic panel - TSH - Lipid panel - Microalbumin / creatinine urine ratio  6. Chronic left-sided low back pain without sciatica PT referral placed. Unsure if her gait instability/posture is contributing due to her back pain. Will do trial of PT and get her into ortho to address her knee. If back not improving, she may need MRI. Will do trial of muscle relaxer prn. Only wants to take at night and will do flexeril. Discussed may make her drowsy and would start off with 1/2 tab. Refilled her tramdol that she takes prn for severe pain. pmp website checked and last filled in 11/2018.  - Ambulatory referral to Physical Therapy  7. Osteopenia, unspecified location Start calcium and vitamin D. Would like her to exercise, but I think this will be limited due to her knee. DEXA ordered.  - DG Bone Density; Future  8. Post-traumatic osteoarthritis of right knee voltaren qid and referral to ortho.  - Ambulatory referral to Orthopedic Surgery  9. Low magnesium level Do not think she needs to take otc magnesium. Will check her level.  - Magnesium   Future Appointments  Date Time Provider [place]  02/13/2020  8:20 AM Orma Flaming, MD [facility]   Return in about 6 months (around 02/10/2020) for htn/labs .   Lab/Order associations: Essential hypertension - Plan: CBC with Differential/Platelet, Comprehensive metabolic panel, TSH, Lipid panel, Microalbumin / creatinine urine ratio  Welcome to Medicare preventive visit - Plan: EKG 12-Lead  Encounter for hepatitis C screening test for low risk patient - Plan: Hepatitis C antibody  Encounter for screening for HIV - Plan: HIV antibody  Encounter for screening mammogram for breast  cancer - Plan: MM Digital Screening  Chronic left-sided low back pain without sciatica - Plan: Ambulatory referral to Physical Therapy  Osteopenia, unspecified location - Plan: DG Bone Density  Post-traumatic osteoarthritis of right knee - Plan: Ambulatory referral to Orthopedic Surgery  Low magnesium level - Plan: Magnesium  Meds ordered this encounter  Medications  . traMADol-acetaminophen (ULTRACET) 37.5-325 MG tablet    Sig: Take 1 tablet by mouth every 8 (eight) hours as needed.    Dispense:  30 tablet    Refill:  0  . cyclobenzaprine (FLEXERIL) 10 MG tablet    Sig: Take 1 tablet (10 mg total) by mouth 3 (three) times daily as needed for muscle spasms.    Dispense:  30 tablet    Refill:  0  . hydrochlorothiazide (MICROZIDE) 12.5 MG capsule    Sig: Take 1 capsule (12.5 mg total) by mouth daily.    Dispense:  90 capsule    Refill:  3  . losartan (COZAAR) 100 MG tablet    Sig: Take 1 tablet (100 mg total) by mouth daily.    Dispense:  90 tablet    Refill:  3  . omeprazole (PRILOSEC) 40 MG capsule    Sig: TAKE ONE CAPSULE BY MOUTH EVERY DAY 30 MINUTES BEFORE BREAkfast    Dispense:  90 capsule    Refill:  3   This visit occurred during the SARS-CoV-2 public health emergency.  Safety protocols were in place, including screening questions prior to the visit, additional usage of staff PPE, and extensive cleaning of exam room while observing appropriate contact time as indicated for disinfecting solutions.   >45 minutes spent in medical decision making.   Return precautions advised. Orma Flaming, MD

## 2019-08-13 NOTE — Patient Instructions (Addendum)
-  I want you to try over the counter voltaren goal for your knee. It's a topical NSAID to keep you off the oral NSAIDs. Sent in tramadol as well.   -muscle relaxer sent in. Would start with 1/2 a tab because can make you really drowsy. Do not want you on this daily. Also did referral to PT for your back.   -all your labs will be done today.   -referral for mammogram and bone scan. They will call you for this.   -referral to ortho for your right knee.   -for your osteopenia: make sure you are on calcium and vitamin D. 1200mg  of calcium and 800-1000IU/day of vitamin. Walking and weight bearing exercises will help this as well. I know this is hard with your knee.   -refilled all of your medication. I would stop the magnesium.   [name] , Thank you for taking time to come for your Medicare Wellness Visit. I appreciate your ongoing commitment to your health goals. Please review the following plan we discussed and let me know if I can assist you in the future.   These are the goals we discussed: Goals   None     This is a list of the screening recommended for you and due dates:  Health Maintenance  Topic Date Due  .  Hepatitis C: One time screening is recommended by Center for Disease Control  (CDC) for  adults born from [birth year] through 1967.   Feb 22, 1956  . HIV Screening  01/07/1969  . Pap Smear  01/08/1975  . Mammogram  01/08/2004  . Colon Cancer Screening  01/08/2004  . DEXA scan (bone density measurement)  01/08/2019  . Pneumonia vaccines (2 of 2 - PCV13) 07/10/2020  . Tetanus Vaccine  05/02/2024  . Flu Shot  Completed

## 2019-08-14 LAB — HEPATITIS C ANTIBODY
Hepatitis C Ab: NONREACTIVE
SIGNAL TO CUT-OFF: 0.01 (ref <1.00)

## 2019-08-14 LAB — HIV ANTIBODY (ROUTINE TESTING W REFLEX): HIV 1&2 Ab, 4th Generation: NONREACTIVE

## 2019-08-15 ENCOUNTER — Encounter: Payer: Self-pay | Admitting: Family Medicine

## 2019-08-15 DIAGNOSIS — E785 Hyperlipidemia, unspecified: Secondary | ICD-10-CM | POA: Insufficient documentation

## 2019-08-17 ENCOUNTER — Telehealth: Payer: Self-pay

## 2019-08-17 NOTE — Telephone Encounter (Signed)
Spoke with patient and advised that we r/c a fax from [employer] stating that Flexeril required a PA, which we do not do.  Advised that she can either pay OOP or contact her insurance to find out which muscle relaxer is preferred under her insurance plan.  She states that she was able to get it for under $10 through [email or web address].  Contacted patient's insurance company to inform them to cancel PA request.

## 2019-08-22 ENCOUNTER — Other Ambulatory Visit: Payer: Self-pay | Admitting: Family Medicine

## 2019-08-22 DIAGNOSIS — D72829 Elevated white blood cell count, unspecified: Secondary | ICD-10-CM

## 2019-08-22 MED ORDER — ROSUVASTATIN CALCIUM 5 MG PO TABS: 5.0000 mg | ORAL_TABLET | Freq: Every day | ORAL | 3 refills | Status: DC

## 2019-08-22 NOTE — Progress Notes (Signed)
[facility]

## 2019-08-27 NOTE — Telephone Encounter (Signed)
Message sent to pt Dr. Rogers Blocker is off this week.

## 2019-08-30 ENCOUNTER — Other Ambulatory Visit: Payer: Self-pay | Admitting: Family Medicine

## 2019-08-30 DIAGNOSIS — Z1231 Encounter for screening mammogram for malignant neoplasm of breast: Secondary | ICD-10-CM

## 2019-08-30 DIAGNOSIS — M8580 Other specified disorders of bone density and structure, unspecified site: Secondary | ICD-10-CM

## 2019-08-30 NOTE — Telephone Encounter (Signed)
Pt has not called back to schedule appt. Records will be kept in the filling cabinet.

## 2019-08-31 LAB — DRUG SCREEN, URINE: .: POSITIVE

## 2019-08-31 LAB — URINALYSIS, COMPLETE

## 2019-09-05 ENCOUNTER — Ambulatory Visit (INDEPENDENT_AMBULATORY_CARE_PROVIDER_SITE_OTHER): Payer: Medicare HMO

## 2019-09-05 ENCOUNTER — Ambulatory Visit: Payer: Medicare HMO | Admitting: Orthopaedic Surgery

## 2019-09-05 ENCOUNTER — Encounter: Payer: Self-pay | Admitting: Orthopaedic Surgery

## 2019-09-05 ENCOUNTER — Telehealth: Payer: Self-pay

## 2019-09-05 ENCOUNTER — Other Ambulatory Visit: Payer: Self-pay

## 2019-09-05 DIAGNOSIS — M4807 Spinal stenosis, lumbosacral region: Secondary | ICD-10-CM

## 2019-09-05 DIAGNOSIS — G8929 Other chronic pain: Secondary | ICD-10-CM | POA: Diagnosis not present

## 2019-09-05 DIAGNOSIS — M1731 Unilateral post-traumatic osteoarthritis, right knee: Secondary | ICD-10-CM

## 2019-09-05 DIAGNOSIS — M545 Low back pain, unspecified: Secondary | ICD-10-CM

## 2019-09-05 DIAGNOSIS — M1711 Unilateral primary osteoarthritis, right knee: Secondary | ICD-10-CM

## 2019-09-05 MED ORDER — METHYLPREDNISOLONE ACETATE 40 MG/ML IJ SUSP
40.0000 mg | INTRAMUSCULAR | Status: AC | PRN
  Administered 2019-09-05: 09:00:00 40 mg via INTRA_ARTICULAR

## 2019-09-05 MED ORDER — LIDOCAINE HCL 1 % IJ SOLN
3.0000 mL | INTRAMUSCULAR | Status: AC | PRN
  Administered 2019-09-05: 09:00:00 3 mL

## 2019-09-05 NOTE — Telephone Encounter (Signed)
Right knee gel injection  

## 2019-09-05 NOTE — Progress Notes (Signed)
Office Visit Note   Patient: Tracy Mckenzie           Date of Birth: [birth year]           MRN: [id number] Visit Date: 09/05/2019              Requested by: Orma Flaming, [address] [place],  [place] [address] PCP: Orma Flaming, MD   Assessment & Plan: Visit Diagnoses:  1. Post-traumatic osteoarthritis of right knee   2. Chronic left-sided low back pain, unspecified whether sciatica present   3. Unilateral primary osteoarthritis, right knee     Plan: From her knee standpoint, I do feel she would benefit from a steroid injection in her knee today and follow this up in 4 weeks now with hyaluronic acid to treat the pain from osteoarthritis.  At some point she may end up needing a knee replacement.  I do feel her lumbar spine needs further work-up given the sciatic symptoms in the radicular symptoms combined with her radiographic findings.  I feel an MRI is medically necessary at this point of the lumbar spine to assess for stenosis and nerve compression.  She agrees with this treatment plan.  She did tolerate the steroid injection well in her right knee.  We will see her back in 4 weeks to go over the MRI of her lumbar spine and to hopefully place hyaluronic acid into the right knee.  All question concerns were answered and addressed.  Follow-Up Instructions: Return in about 4 weeks (around 10/03/2019).   Orders:  Orders Placed This Encounter  Procedures  . Large Joint Inj  . XR Knee 1-2 Views Right  . XR Tibia/Fibula Right  . XR Lumbar Spine 2-3 Views   No orders of the defined types were placed in this encounter.     Procedures: Large Joint Inj: R knee on 09/05/2019 9:08 AM Indications: diagnostic evaluation and pain Details: 22 G 1.5 in needle, superolateral approach  Arthrogram: No  Medications: 3 mL lidocaine 1 %; 40 mg methylPREDNISolone acetate 40 MG/ML Outcome: tolerated well, no immediate complications Procedure, treatment alternatives, risks and benefits  explained, specific risks discussed. Consent was given by the patient. Immediately prior to procedure a time out was called to verify the correct patient, procedure, equipment, support staff and site/side marked as required. Patient was prepped and draped in the usual sterile fashion.       Clinical Data: No additional findings.   Subjective: Chief Complaint  Patient presents with  . Right Knee - Pain  The patient is a very pleasant 64 year old female that I am seeing for the first time.  She has quite a significant history of trauma to her right lower extremity.  Over 15 to 20 years ago she sustained a tib-fib fracture and had a intramedullary nail placed in her right leg.  Eventually she had to have this nail removed.  She has had at least 2 arthroscopic interventions on that right knee due to arthritic changes and meniscal tears of the lateral compartment of her knee.  Her knee does hurt on a daily basis and she does get a lot of locking catching with that knee.  All the pain is lateral.  Without being said she has had debilitating low back pain to the left side for many years now.  Is become worse for her.  It radiates into the sciatic region and down her leg.  She is not a diabetic but does report numbness and tingling  in both her feet as well.  At this point her back pain is detriment affecting her mobility, her quality of life and her actives daily living.  She feels this is gotten worse with time with having her gait difficulties due to dealing with her right knee as well.  She is otherwise healthy individual with a history of high blood pressure.  She is on medications for this.  She last had a steroid injection in her right knee in 2013.  At this point her knee pain and back pain is daily and can be 10 out of 10 at times.  HPI  Review of Systems She currently denies any headache, chest pain, shortness of breath, fever, chills, nausea, vomiting  Objective: Vital Signs: There were no  vitals taken for this visit.  Physical Exam She is alert and orient x3 and in no acute distress Ortho Exam Examination of her right knee does show scarring from previous surgery on the right knee from an arthroscopic standpoint and from where the intramedullary nail was placed.  She has significant lateral joint line tenderness and a positive Murray sign to the lateral compartment of the knee.  There is patellofemoral crepitation as well.  The knee has good range of motion and is ligamentously stable.  She does have pain with flexion extension lumbar spine.  She has significant imitations with extension lumbar spine.  She does have a positive straight leg raise to the left side.  There is numbness subjectively in both feet but good strength in her legs. Specialty Comments:  No specialty comments available.  Imaging: XR Knee 1-2 Views Right  Result Date: 09/05/2019 An AP and lateral of the right knee shows significant arthritic changes of the lateral compartment and the patellofemoral joint.  XR Lumbar Spine 2-3 Views  Result Date: 09/05/2019 2 views of the lumbar spine show severe degenerative changes at multiple levels with degenerative disc disease.  There is loss of her lumbar lordosis.  There is significant stenosis of the foramina at multiple levels  XR Tibia/Fibula Right  Result Date: 09/05/2019 2 views of the tibia and fibula right side show a healed distal third tib-fib fracture.  The bone is osteopenic.    PMFS History: Patient Active Problem List   Diagnosis Date Noted  . Unilateral primary osteoarthritis, right knee 09/05/2019  . Hyperlipidemia 08/15/2019  . Essential hypertension 08/13/2019  . GERD (gastroesophageal reflux disease) 08/13/2019  . Osteopenia 08/13/2019  . Post-traumatic osteoarthritis of right knee 08/13/2019  . Hiatal hernia 08/13/2019  . Chronic left-sided low back pain without sciatica 08/13/2019   Past Medical History:  Diagnosis Date  . Arthritis     . Bleeding ulcer   . Elevated cholesterol   . Hiatal hernia   . High blood pressure   . Kidney cysts   . Liver cyst   . Nerve pain    L sciatic pain radiating to R  . Peptic ulcer disease     Family History  Problem Relation Age of Onset  . Kidney disease Mother   . COPD Mother   . Breast cancer Maternal Aunt   . Breast cancer Paternal Aunt   . COPD Father   . Diabetes Maternal Grandfather   . Stroke Paternal Grandfather   . Colon cancer Neg Hx     Past Surgical History:  Procedure Laterality Date  . APPENDECTOMY  1992  . KNEE ARTHROSCOPY Right 04/12/2001   torn cartilage  . LAPAROSCOPY  1981   for  endometriosis  . LEG SURGERY Right 11/23/2000   to remove screws  . LEG SURGERY Right 02/08/2002   to remove rod  . MENISCUS REPAIR  01/29/2003  . TIBIA FRACTURE SURGERY Right 06/12/2000   Multiple surgeries  . TONSILLECTOMY  1967  . TOTAL ABDOMINAL HYSTERECTOMY  1992   Social History   Occupational History  . Not on file  Tobacco Use  . Smoking status: Never Smoker  . Smokeless tobacco: Never Used  Substance and Sexual Activity  . Alcohol use: No  . Drug use: No  . Sexual activity: Not on file

## 2019-09-06 ENCOUNTER — Ambulatory Visit: Payer: Medicare HMO | Admitting: Physical Therapy

## 2019-09-06 ENCOUNTER — Encounter: Payer: Self-pay | Admitting: Physical Therapy

## 2019-09-06 ENCOUNTER — Telehealth: Payer: Self-pay

## 2019-09-06 DIAGNOSIS — G8929 Other chronic pain: Secondary | ICD-10-CM

## 2019-09-06 DIAGNOSIS — M25561 Pain in right knee: Secondary | ICD-10-CM

## 2019-09-06 DIAGNOSIS — M545 Low back pain, unspecified: Secondary | ICD-10-CM

## 2019-09-06 DIAGNOSIS — M79661 Pain in right lower leg: Secondary | ICD-10-CM | POA: Diagnosis not present

## 2019-09-06 NOTE — Telephone Encounter (Signed)
New start, submit for VOB.

## 2019-09-06 NOTE — Telephone Encounter (Signed)
Submitted for VOB for GEL one right knee

## 2019-09-06 NOTE — Telephone Encounter (Signed)
Submitted

## 2019-09-09 ENCOUNTER — Encounter: Payer: Self-pay | Admitting: Physical Therapy

## 2019-09-09 NOTE — Therapy (Signed)
[facility] [address] [place], [place], [address] Phone: [phone]   Fax:  [phone]  Physical Therapy Evaluation  Patient Details  Name: Tracy Mckenzie MRN: [id number] Date of Birth: 01/27/1956 Referring Provider (PT): Orma Flaming   Encounter Date: 09/06/2019  PT End of Session - 09/09/19 1648    Visit Number  1    Number of Visits  12    Date for PT Re-Evaluation  10/18/19    Authorization Type  [employer]    PT Start Time  1345    PT Stop Time  1425    PT Time Calculation (min)  40 min    Activity Tolerance  Patient tolerated treatment well    Behavior During Therapy  [facility] for tasks assessed/performed       Past Medical History:  Diagnosis Date  . Arthritis   . Bleeding ulcer   . Elevated cholesterol   . Hiatal hernia   . High blood pressure   . Kidney cysts   . Liver cyst   . Nerve pain    L sciatic pain radiating to R  . Peptic ulcer disease     Past Surgical History:  Procedure Laterality Date  . APPENDECTOMY  1992  . KNEE ARTHROSCOPY Right 04/12/2001   torn cartilage  . LAPAROSCOPY  1981   for endometriosis  . LEG SURGERY Right 11/23/2000   to remove screws  . LEG SURGERY Right 02/08/2002   to remove rod  . MENISCUS REPAIR  01/29/2003  . TIBIA FRACTURE SURGERY Right 06/12/2000   Multiple surgeries  . TONSILLECTOMY  1967  . TOTAL ABDOMINAL HYSTERECTOMY  1992    There were no vitals filed for this visit.   Subjective Assessment - 09/09/19 1646    Subjective  Pain reports pain in low back, L, into L glute. Back pain for 10 years. Old injuries to R knee, leg, may be related, she had 2 meniscal surgeries, and 2 lower leg surgeries. She is currently not doing any HEP. did have recent x-rays, lumbar MRI ordered. States most pain on L low back region. She works 2 jobs, up to 12 hr days, does sit most of the time.    Limitations  Lifting;Standing;Walking;House hold activities    Patient Stated Goals   Decreased pain in back and R LE.    Currently in Pain?  Yes    Pain Score  7     Pain Location  Back    Pain Orientation  Left    Pain Descriptors / Indicators  Aching    Pain Type  Chronic pain    Pain Onset  More than a month ago    Pain Frequency  Intermittent    Aggravating Factors   standing    Pain Relieving Factors  sitting         [facility] PT Assessment - 09/09/19 0001      Assessment   Medical Diagnosis  Back pain, R knee pain    Referring Provider (PT)  Orma Flaming    Prior Therapy  no      Balance Screen   Has the patient fallen in the past 6 months  No      Prior Function   Level of Independence  Independent      Cognition   Overall Cognitive Status  Within Functional Limits for tasks assessed      AROM   AROM Assessment Site  Knee    Right/Left Knee  Right  Right Knee Extension  -5   wfl   Right Knee Flexion  --   wfl   Lumbar Flexion  mild limitation    Lumbar Extension  mild limitaiton    Lumbar - Right Side Bend  mild limitation    Lumbar - Left Side Bend  mild limitation      Strength   Overall Strength Comments  R knee: 4/5,  Hips: 4-/5       Palpation   Palpation comment  Pain at L SI, and pain/trigger points into L glute.  Tightness in L paraspinals, Soreness in R lower leg near old scar, Mild pain at med/lat joint line of R knee, Hypomobile R knee for extension.                 Objective measurements completed on examination: See above findings.      [facility] PT Treatment/Exercise - 09/09/19 0001      Exercises   Exercises  Lumbar      Lumbar Exercises: Stretches   Active Hamstring Stretch  2 reps;30 seconds    Active Hamstring Stretch Limitations  seated    Single Knee to Chest Stretch  2 reps;30 seconds;Right;Left    Pelvic Tilt  20 reps    Piriformis Stretch  3 reps;30 seconds    Piriformis Stretch Limitations  supine/modified       Manual Therapy   Manual Therapy  Soft tissue mobilization    Soft tissue  mobilization  STM/DTM to L SI and L glute region             PT Education - 09/09/19 1648    Education Details  PT POC, Exam findings. HEP    Person(s) Educated  Patient    Methods  Explanation;Demonstration;Verbal cues;Tactile cues;Handout    Comprehension  Verbalized understanding;Returned demonstration;Verbal cues required;Need further instruction       PT Short Term Goals - 09/09/19 1651      PT SHORT TERM GOAL #1   Title  Pt to be indepndent with initial HEP    Time  2    Period  Weeks    Status  New    Target Date  09/20/19        PT Long Term Goals - 09/09/19 1651      PT LONG TERM GOAL #1   Title  Pt to be independent with final HEP for back and R LE    Time  6    Period  Weeks    Status  New    Target Date  10/18/19      PT LONG TERM GOAL #2   Title  Pt to report decreased pain in low back to 0-3/10 with standing activity and IADLs.    Time  6    Period  Weeks    Status  New    Target Date  10/18/19      PT LONG TERM GOAL #3   Title  Pt to demo proper mechanics for sit to stand, squat, and lift, to improve safety and pain with IADLs.    Time  6    Period  Weeks    Status  New    Target Date  10/18/19      PT LONG TERM GOAL #4   Title  Pt to demo improved LE strength to at least 4+/5 to improve ability for IADLs and ambulation.    Time  6    Period  Weeks  Status  New    Target Date  10/21/19             Plan - 09/09/19 1654    Clinical Impression Statement  Pt presents with primary complaint of increaesd pain in low back, as well as R knee and leg. Pt with soreness in L SI, and tenderness/trigger points into L glute. She has decreased strength in hips and knees, and lack of effective HEP. She has decreased ability and endurance for standing activity.  She has increased pian with standing and walking, with decreased ability for exercise, IADLS, community activities. Pt to benefit from skilled PT to improve.    Personal Factors and  Comorbidities  Past/Current Experience;Comorbidity 1;Comorbidity 2    Comorbidities  2 knee surgeries, Ankle surgery with complications, Osteopenia, OA,    Examination-Activity Limitations  Locomotion Level;Transfers;Squat;Stairs;Lift;Stand    Examination-Participation Restrictions  Church;Cleaning;Community Activity;Driving;Shop;Laundry;Meal Prep    Stability/Clinical Decision Making  Evolving/Moderate complexity    Clinical Decision Making  Moderate    Rehab Potential  Good    PT Frequency  2x / week    PT Duration  6 weeks    PT Treatment/Interventions  ADLs/Self Care Home Management;Cryotherapy;Electrical Stimulation;Gait training;DME Instruction;Contrast Bath;Fluidtherapy;Ultrasound;Traction;Moist Heat;Iontophoresis 4mg /ml Dexamethasone;Stair training;Functional mobility training;Therapeutic activities;Therapeutic exercise;Balance training;Neuromuscular re-education;Manual techniques;Orthotic Fit/Training;Patient/family education;Passive range of motion;Dry needling;Splinting;Joint Manipulations;Spinal Manipulations;Taping;Vasopneumatic Device    Consulted and Agree with Plan of Care  Patient       Patient will benefit from skilled therapeutic intervention in order to improve the following deficits and impairments:  Abnormal gait, Decreased range of motion, Difficulty walking, Increased muscle spasms, Decreased endurance, Decreased activity tolerance, Pain, Improper body mechanics, Impaired flexibility, Hypomobility, Decreased balance, Decreased mobility, Decreased strength  Visit Diagnosis: Chronic left-sided low back pain without sciatica  Chronic pain of right knee  Pain in right lower leg     Problem List Patient Active Problem List   Diagnosis Date Noted  . Unilateral primary osteoarthritis, right knee 09/05/2019  . Hyperlipidemia 08/15/2019  . Essential hypertension 08/13/2019  . GERD (gastroesophageal reflux disease) 08/13/2019  . Osteopenia 08/13/2019  . Post-traumatic  osteoarthritis of right knee 08/13/2019  . Hiatal hernia 08/13/2019  . Chronic left-sided low back pain without sciatica 08/13/2019   Lyndee Hensen, PT, DPT 4:59 PM  09/09/19    [place], [place], [address] Phone: [phone]   Fax:  [phone]  Name: Tracy Mckenzie MRN: [id number] Date of Birth: [birth year]

## 2019-09-09 NOTE — Patient Instructions (Signed)
Seated hamstring stretch 30 sec x3 bil;  Single knee to chest 30 sec x3  bil  Piriformis , supine fig 4 , 30 sec x3 bil;  Pelvic tilts x20

## 2019-09-11 ENCOUNTER — Telehealth: Payer: Self-pay

## 2019-09-11 ENCOUNTER — Encounter: Payer: Medicare HMO | Admitting: Physical Therapy

## 2019-09-11 NOTE — Telephone Encounter (Signed)
Not sure if you can help with this or [place]!

## 2019-09-11 NOTE — Telephone Encounter (Signed)
Think this should have gone to PT?

## 2019-09-11 NOTE — Telephone Encounter (Signed)
Patient called in requesting for her appts to be canceled for this week and next week because she reached out to her insurance to verify if her co pays are $35 for physical therapy. Patient states that the insurance told her that pre authorization is required in order for her to have these services and no one one has requested that from this office. Patient asked me how much would I her co pay and I informed her that i'm showing a $35 co pay for PT service. Patient want to know what's needed in order for her to have PT service. Please return pt call.

## 2019-09-12 ENCOUNTER — Encounter: Payer: Medicare HMO | Admitting: Physical Therapy

## 2019-09-14 ENCOUNTER — Telehealth: Payer: Self-pay

## 2019-09-14 NOTE — Telephone Encounter (Signed)
Approved for Gel one- right knee Dr. Margarito Liner and Bill $25 copay 20% OOP Prior auth required [place] # [id number] Dates: 09/11/19-5/9-21

## 2019-09-17 ENCOUNTER — Encounter: Payer: Medicare HMO | Admitting: Physical Therapy

## 2019-09-18 ENCOUNTER — Telehealth: Payer: Self-pay | Admitting: Family Medicine

## 2019-09-18 NOTE — Telephone Encounter (Signed)
Patient called in saying that Dr.Wolfe has changed her BP medicine from 50 MG to 100 MG and said she has been having a really hard time with this switch, she has been felling sick and it has been effecting her at work. her recent BP level was 99/49

## 2019-09-18 NOTE — Telephone Encounter (Signed)
Patient started on On the increased blood pressure medication she has been feeling dizzy, light headed and not able to think clearly and not able to function. She checked her BP today and it was 99/49. She would like to go back to the 50mg  of the losartan daily.

## 2019-09-19 NOTE — Telephone Encounter (Signed)
Pt says that she cannot tolerate the 100 mg tab. She has tried to take it at bedtime, and in the morning. She gets the same reaction: Low blood pressure, confusion, and dizziness. Since then she has stopped taking them and has started back on the 50 mg. She says that she has 1 refill left, so she does not need a refill right now.

## 2019-09-19 NOTE — Telephone Encounter (Signed)
Let her know I didn't increase her BP medication. She was taking 50mg  twice a day. That is 100mg . I just changed her to 100mg /day. Can we clarify what she was on because at appointment and in her chart we discussed she was taking 50mg  TWICE a day, that is why I just changed to the 100mg  pill. If her blood pressure is truly that low, then yes we  need to decrease dosage.   Orma Flaming, MD [place]

## 2019-09-20 ENCOUNTER — Encounter: Payer: Medicare HMO | Admitting: Physical Therapy

## 2019-09-20 NOTE — Telephone Encounter (Signed)
Left message on voicemail to call office.  

## 2019-09-20 NOTE — Telephone Encounter (Signed)
Can you clarify if shes taking the 50mg  twice a day or once a day please? Tracy Flaming, MD [place]

## 2019-09-21 NOTE — Telephone Encounter (Signed)
Patient returned call on 2/19 at 8:12. Please return call.

## 2019-09-21 NOTE — Telephone Encounter (Signed)
Called pt to verify which dosage she is taking. She is taking 50 mg twice daily. She states that she has taken it like this for years.

## 2019-09-24 ENCOUNTER — Encounter: Payer: Medicare HMO | Admitting: Physical Therapy

## 2019-09-27 ENCOUNTER — Other Ambulatory Visit: Payer: Self-pay

## 2019-09-27 ENCOUNTER — Ambulatory Visit: Payer: Medicare HMO | Admitting: Physical Therapy

## 2019-09-27 DIAGNOSIS — M79661 Pain in right lower leg: Secondary | ICD-10-CM | POA: Diagnosis not present

## 2019-09-27 DIAGNOSIS — M25561 Pain in right knee: Secondary | ICD-10-CM | POA: Diagnosis not present

## 2019-09-27 DIAGNOSIS — G8929 Other chronic pain: Secondary | ICD-10-CM

## 2019-09-27 DIAGNOSIS — M545 Low back pain: Secondary | ICD-10-CM

## 2019-09-27 NOTE — Patient Instructions (Signed)
Access Code: [email or web address]  URL: [email or web address]  Date: 09/27/2019  Prepared by: Lyndee Hensen   Exercises Supine Single Knee to Chest Stretch - 3 reps - 30 hold - 2x daily Supine Posterior Pelvic Tilt - 10 reps - 2 sets - 2x daily Supine Figure 4 Piriformis Stretch - 3 reps - 30 hold - 2x daily Seated Hamstring Stretch - 3 reps - 30 hold - 2x daily Seated Knee Extension AROM - 10 reps - 2 sets - 1x daily Straight Leg Raise - 10 reps - 2 sets - 1x daily Sidelying Hip Abduction - 10 reps - 2 sets - 1x daily

## 2019-09-30 ENCOUNTER — Encounter: Payer: Self-pay | Admitting: Physical Therapy

## 2019-09-30 NOTE — Therapy (Signed)
[facility] [address] [place], [place], [address] Phone: [phone]   Fax:  [phone]  Physical Therapy Treatment  Patient Details  Name: Tracy Mckenzie MRN: [id number] Date of Birth: 17-Feb-1956 Referring Provider (PT): Orma Flaming   Encounter Date: 09/27/2019  PT End of Session - 09/30/19 2055    Visit Number  2    Number of Visits  12    Date for PT Re-Evaluation  10/18/19    Authorization Type  [employer]    PT Start Time  1105    PT Stop Time  1150    PT Time Calculation (min)  45 min    Activity Tolerance  Patient tolerated treatment well    Behavior During Therapy  [facility] for tasks assessed/performed       Past Medical History:  Diagnosis Date  . Arthritis   . Bleeding ulcer   . Elevated cholesterol   . Hiatal hernia   . High blood pressure   . Kidney cysts   . Liver cyst   . Nerve pain    L sciatic pain radiating to R  . Peptic ulcer disease     Past Surgical History:  Procedure Laterality Date  . APPENDECTOMY  1992  . KNEE ARTHROSCOPY Right 04/12/2001   torn cartilage  . LAPAROSCOPY  1981   for endometriosis  . LEG SURGERY Right 11/23/2000   to remove screws  . LEG SURGERY Right 02/08/2002   to remove rod  . MENISCUS REPAIR  01/29/2003  . TIBIA FRACTURE SURGERY Right 06/12/2000   Multiple surgeries  . TONSILLECTOMY  1967  . TOTAL ABDOMINAL HYSTERECTOMY  1992    There were no vitals filed for this visit.  Subjective Assessment - 09/30/19 2052    Subjective  Pt with no new complaints. Pt last seen 2/4 for eval, was awaiting insurance approval for prior auth.    Currently in Pain?  Yes    Pain Score  5     Pain Location  Back    Pain Orientation  Left    Pain Descriptors / Indicators  Aching;Tightness    Pain Type  Chronic pain    Pain Onset  More than a month ago    Pain Frequency  Intermittent                       [facility] Adult PT Treatment/Exercise - 09/30/19 0001      Exercises   Exercises  Lumbar      Lumbar Exercises: Stretches   Active Hamstring Stretch  2 reps;30 seconds    Active Hamstring Stretch Limitations  seated    Single Knee to Chest Stretch  2 reps;30 seconds;Right;Left    Pelvic Tilt  20 reps    Piriformis Stretch  3 reps;30 seconds    Piriformis Stretch Limitations  supine/modified       Lumbar Exercises: Aerobic   Stationary Bike  L1 x 6 min;       Lumbar Exercises: Seated   Long Arc Quad on Chair  Both;2 sets;10 reps    Other Seated Lumbar Exercises  Hamstring curl x15 RTB bil       Lumbar Exercises: Supine   Clam  20 reps    Clam Limitations  RTB    Straight Leg Raise  10 reps    Straight Leg Raises Limitations  with TA (for R knee)       Lumbar Exercises: Sidelying   Hip Abduction  10 reps;Both      Manual Therapy   Manual Therapy  Soft tissue mobilization    Manual therapy comments  skilled palpation and monitoring of soft tissue with dry needling.     Soft tissue mobilization  STM/DTM to L SI and L glute region       Trigger Point Dry Needling - 09/30/19 0001    Consent Given?  Yes    Education Handout Provided  Yes    Muscles Treated Back/Hip  Piriformis;Gluteus minimus    Gluteus Minimus Response  Palpable increased muscle length    Piriformis Response  Palpable increased muscle length;Twitch response elicited           PT Education - 09/30/19 2054    Education Details  HEP updated    Person(s) Educated  Patient    Methods  Explanation;Demonstration;Handout;Verbal cues    Comprehension  Verbalized understanding;Returned demonstration;Verbal cues required;Need further instruction;Tactile cues required       PT Short Term Goals - 09/09/19 1651      PT SHORT TERM GOAL #1   Title  Pt to be indepndent with initial HEP    Time  2    Period  Weeks    Status  New    Target Date  09/20/19        PT Long Term Goals - 09/09/19 1651      PT LONG TERM GOAL #1   Title  Pt to be independent with final  HEP for back and R LE    Time  6    Period  Weeks    Status  New    Target Date  10/18/19      PT LONG TERM GOAL #2   Title  Pt to report decreased pain in low back to 0-3/10 with standing activity and IADLs.    Time  6    Period  Weeks    Status  New    Target Date  10/18/19      PT LONG TERM GOAL #3   Title  Pt to demo proper mechanics for sit to stand, squat, and lift, to improve safety and pain with IADLs.    Time  6    Period  Weeks    Status  New    Target Date  10/18/19      PT LONG TERM GOAL #4   Title  Pt to demo improved LE strength to at least 4+/5 to improve ability for IADLs and ambulation.    Time  6    Period  Weeks    Status  New    Target Date  10/21/19            Plan - 09/30/19 2056    Clinical Impression Statement  Pt with most soreness in L glute, addressed with manual and DN today. Pt with good tolerance. Ther ex progressed for LE strength. Plan to progress as tolerated.    Personal Factors and Comorbidities  Past/Current Experience;Comorbidity 1;Comorbidity 2    Comorbidities  2 knee surgeries, Ankle surgery with complications, Osteopenia, OA,    Examination-Activity Limitations  Locomotion Level;Transfers;Squat;Stairs;Lift;Stand    Examination-Participation Restrictions  Church;Cleaning;Community Activity;Driving;Shop;Laundry;Meal Prep    Stability/Clinical Decision Making  Evolving/Moderate complexity    Rehab Potential  Good    PT Frequency  2x / week    PT Duration  6 weeks    PT Treatment/Interventions  ADLs/Self Care Home Management;Cryotherapy;Electrical Stimulation;Gait training;DME Instruction;Contrast Bath;Fluidtherapy;Ultrasound;Traction;Moist Heat;Iontophoresis 4mg /ml Dexamethasone;Stair training;Functional mobility training;Therapeutic  activities;Therapeutic exercise;Balance training;Neuromuscular re-education;Manual techniques;Orthotic Fit/Training;Patient/family education;Passive range of motion;Dry needling;Splinting;Joint  Manipulations;Spinal Manipulations;Taping;Vasopneumatic Device    Consulted and Agree with Plan of Care  Patient       Patient will benefit from skilled therapeutic intervention in order to improve the following deficits and impairments:  Abnormal gait, Decreased range of motion, Difficulty walking, Increased muscle spasms, Decreased endurance, Decreased activity tolerance, Pain, Improper body mechanics, Impaired flexibility, Hypomobility, Decreased balance, Decreased mobility, Decreased strength  Visit Diagnosis: Chronic left-sided low back pain without sciatica  Chronic pain of right knee  Pain in right lower leg     Problem List Patient Active Problem List   Diagnosis Date Noted  . Unilateral primary osteoarthritis, right knee 09/05/2019  . Hyperlipidemia 08/15/2019  . Essential hypertension 08/13/2019  . GERD (gastroesophageal reflux disease) 08/13/2019  . Osteopenia 08/13/2019  . Post-traumatic osteoarthritis of right knee 08/13/2019  . Hiatal hernia 08/13/2019  . Chronic left-sided low back pain without sciatica 08/13/2019    Lyndee Hensen, PT, DPT 8:57 PM  09/30/19    [facility] [place], [place], [address] Phone: [phone]   Fax:  [phone]  Name: Tracy Mckenzie MRN: [id number] Date of Birth: 1956/07/16

## 2019-10-01 ENCOUNTER — Encounter: Payer: Medicare HMO | Admitting: Physical Therapy

## 2019-10-02 ENCOUNTER — Ambulatory Visit: Payer: Medicare HMO | Admitting: Physical Therapy

## 2019-10-02 ENCOUNTER — Encounter: Payer: Self-pay | Admitting: Physical Therapy

## 2019-10-02 ENCOUNTER — Other Ambulatory Visit: Payer: Self-pay

## 2019-10-02 DIAGNOSIS — G8929 Other chronic pain: Secondary | ICD-10-CM

## 2019-10-02 DIAGNOSIS — M545 Low back pain: Secondary | ICD-10-CM

## 2019-10-02 DIAGNOSIS — M25561 Pain in right knee: Secondary | ICD-10-CM | POA: Diagnosis not present

## 2019-10-02 DIAGNOSIS — M79661 Pain in right lower leg: Secondary | ICD-10-CM | POA: Diagnosis not present

## 2019-10-02 NOTE — Therapy (Signed)
[facility] [address] [place], [place], [address] Phone: [phone]   Fax:  [phone]  Physical Therapy Treatment  Patient Details  Name: Tracy Mckenzie MRN: [id number] Date of Birth: August 22, 1955 Referring Provider (PT): Orma Flaming   Encounter Date: 10/02/2019  PT End of Session - 10/02/19 1217    Visit Number  3    Number of Visits  12    Date for PT Re-Evaluation  10/18/19    Authorization Type  Aetna    PT Start Time  1213    PT Stop Time  1302    PT Time Calculation (min)  49 min    Activity Tolerance  Patient tolerated treatment well    Behavior During Therapy  [facility] for tasks assessed/performed       Past Medical History:  Diagnosis Date  . Arthritis   . Bleeding ulcer   . Elevated cholesterol   . Hiatal hernia   . High blood pressure   . Kidney cysts   . Liver cyst   . Nerve pain    L sciatic pain radiating to R  . Peptic ulcer disease     Past Surgical History:  Procedure Laterality Date  . APPENDECTOMY  1992  . KNEE ARTHROSCOPY Right 04/12/2001   torn cartilage  . LAPAROSCOPY  1981   for endometriosis  . LEG SURGERY Right 11/23/2000   to remove screws  . LEG SURGERY Right 02/08/2002   to remove rod  . MENISCUS REPAIR  01/29/2003  . TIBIA FRACTURE SURGERY Right 06/12/2000   Multiple surgeries  . TONSILLECTOMY  1967  . TOTAL ABDOMINAL HYSTERECTOMY  1992    There were no vitals filed for this visit.  Subjective Assessment - 10/02/19 1215    Subjective  Pt states difficulty and pain with hip abd with HEP. She states tightness in L glute. Having injection in R knee on Wednesday.    Currently in Pain?  Yes    Pain Score  4     Pain Location  Hip    Pain Descriptors / Indicators  Aching;Tightness    Pain Type  Chronic pain    Pain Onset  More than a month ago    Pain Frequency  Intermittent                       [facility] Adult PT Treatment/Exercise - 10/02/19 1218      Exercises    Exercises  Lumbar      Lumbar Exercises: Stretches   Active Hamstring Stretch  2 reps;30 seconds    Active Hamstring Stretch Limitations  seated    Single Knee to Chest Stretch  2 reps;30 seconds;Right;Left    Pelvic Tilt  --    Piriformis Stretch  3 reps;30 seconds    Piriformis Stretch Limitations  seated      Lumbar Exercises: Aerobic   Stationary Bike  L1 x 6 min;       Lumbar Exercises: Seated   Long Arc Quad on Chair  --    Other Seated Lumbar Exercises  --      Lumbar Exercises: Supine   Ab Set  5 reps    Clam  20 reps    Clam Limitations  RTB    Bent Knee Raise  20 reps    Bent Knee Raise Limitations  with TA    Bridge  10 reps    Straight Leg Raise  10 reps  Straight Leg Raises Limitations  with TA       Lumbar Exercises: Sidelying   Hip Abduction  10 reps;Both      Manual Therapy   Manual Therapy  Soft tissue mobilization;Joint mobilization;Manual Traction    Manual therapy comments  --    Soft tissue mobilization  STM/DTM to L low lumbar region and L glute    Manual Traction  long leg distraction on L, for lumbar pump x2 min                PT Short Term Goals - 09/09/19 1651      PT SHORT TERM GOAL #1   Title  Pt to be indepndent with initial HEP    Time  2    Period  Weeks    Status  New    Target Date  09/20/19        PT Long Term Goals - 09/09/19 1651      PT LONG TERM GOAL #1   Title  Pt to be independent with final HEP for back and R LE    Time  6    Period  Weeks    Status  New    Target Date  10/18/19      PT LONG TERM GOAL #2   Title  Pt to report decreased pain in low back to 0-3/10 with standing activity and IADLs.    Time  6    Period  Weeks    Status  New    Target Date  10/18/19      PT LONG TERM GOAL #3   Title  Pt to demo proper mechanics for sit to stand, squat, and lift, to improve safety and pain with IADLs.    Time  6    Period  Weeks    Status  New    Target Date  10/18/19      PT LONG TERM GOAL #4    Title  Pt to demo improved LE strength to at least 4+/5 to improve ability for IADLs and ambulation.    Time  6    Period  Weeks    Status  New    Target Date  10/21/19            Plan - 10/02/19 1530    Clinical Impression Statement  Pt with less soreness in L Glute with DTM today from previous sessoins. Still bothersome for pt, will continue to focus on this area for pain relief. R hip strengthening much more difficult than L for pt today, ther ex for strengthening progressed. Reviwed HEP    Personal Factors and Comorbidities  Past/Current Experience;Comorbidity 1;Comorbidity 2    Comorbidities  2 knee surgeries, Ankle surgery with complications, Osteopenia, OA,    Examination-Activity Limitations  Locomotion Level;Transfers;Squat;Stairs;Lift;Stand    Examination-Participation Restrictions  Church;Cleaning;Community Activity;Driving;Shop;Laundry;Meal Prep    Stability/Clinical Decision Making  Evolving/Moderate complexity    Rehab Potential  Good    PT Frequency  2x / week    PT Duration  6 weeks    PT Treatment/Interventions  ADLs/Self Care Home Management;Cryotherapy;Electrical Stimulation;Gait training;DME Instruction;Contrast Bath;Fluidtherapy;Ultrasound;Traction;Moist Heat;Iontophoresis 4mg /ml Dexamethasone;Stair training;Functional mobility training;Therapeutic activities;Therapeutic exercise;Balance training;Neuromuscular re-education;Manual techniques;Orthotic Fit/Training;Patient/family education;Passive range of motion;Dry needling;Splinting;Joint Manipulations;Spinal Manipulations;Taping;Vasopneumatic Device    Consulted and Agree with Plan of Care  Patient       Patient will benefit from skilled therapeutic intervention in order to improve the following deficits and impairments:  Abnormal gait, Decreased range of motion,  Difficulty walking, Increased muscle spasms, Decreased endurance, Decreased activity tolerance, Pain, Improper body mechanics, Impaired flexibility,  Hypomobility, Decreased balance, Decreased mobility, Decreased strength  Visit Diagnosis: Chronic left-sided low back pain without sciatica  Chronic pain of right knee  Pain in right lower leg     Problem List Patient Active Problem List   Diagnosis Date Noted  . Unilateral primary osteoarthritis, right knee 09/05/2019  . Hyperlipidemia 08/15/2019  . Essential hypertension 08/13/2019  . GERD (gastroesophageal reflux disease) 08/13/2019  . Osteopenia 08/13/2019  . Post-traumatic osteoarthritis of right knee 08/13/2019  . Hiatal hernia 08/13/2019  . Chronic left-sided low back pain without sciatica 08/13/2019    Lyndee Hensen, PT, DPT 3:33 PM  10/02/19    [facility] [place], [place], [address] Phone: [phone]   Fax:  [phone]  Name: Tracy Mckenzie MRN: [id number] Date of Birth: [birth year]

## 2019-10-03 ENCOUNTER — Encounter: Payer: Self-pay | Admitting: Orthopaedic Surgery

## 2019-10-03 ENCOUNTER — Ambulatory Visit: Payer: Medicare HMO | Admitting: Orthopaedic Surgery

## 2019-10-03 DIAGNOSIS — M1711 Unilateral primary osteoarthritis, right knee: Secondary | ICD-10-CM | POA: Diagnosis not present

## 2019-10-03 MED ORDER — CROSS-LINKED HYALURONATE 30 MG/3ML IX PRSY
30.0000 mg | PREFILLED_SYRINGE | INTRA_ARTICULAR | Status: AC | PRN
Start: 2019-10-03 — End: 2019-10-03
  Administered 2019-10-03: 30 mg via INTRA_ARTICULAR

## 2019-10-03 NOTE — Progress Notes (Signed)
   Procedure Note  Patient: Tracy Mckenzie             Date of Birth: 02-17-1956           MRN: [id number]             Visit Date: 10/03/2019  Procedures: Visit Diagnoses:  1. Unilateral primary osteoarthritis, right knee     Large Joint Inj on 10/03/2019 8:31 AM Indications: diagnostic evaluation and pain Details: 22 G 1.5 in needle, superolateral approach  Arthrogram: No  Medications: 30 mg Cross-Linked Hyaluronate 30 MG/3ML Outcome: tolerated well, no immediate complications Procedure, treatment alternatives, risks and benefits explained, specific risks discussed. Consent was given by the patient. Immediately prior to procedure a time out was called to verify the correct patient, procedure, equipment, support staff and site/side marked as required. Patient was prepped and draped in the usual sterile fashion.    The patient comes in today for scheduled hyaluronic acid injection.  This is to treat the pain from osteoarthritis in that knee.  The steroid injection did not help as much as she would have liked.  On exam there is no knee joint effusion today.  She has excellent range of motion of that knee.  She understands fully well we have recommended hyaluronic acid with gel 1 to hopefully treat the pain of osteoarthritis.  The knee is ligamentously stable but definitely has patellofemoral crepitation and medial joint line tenderness.  X-rays again on the past show osteoarthritis of the knee.  She did tolerate the hyaluronic acid injection well into the right knee.  All question concerns were answered and addressed.  She will still continue to work on quad strengthening exercises and activity modification.  Follow-up will be as needed.  She understands that she can always get a steroid injection later in the summer if needed.

## 2019-10-04 ENCOUNTER — Ambulatory Visit: Payer: Medicare HMO | Admitting: Physical Therapy

## 2019-10-04 ENCOUNTER — Encounter: Payer: Self-pay | Admitting: Physical Therapy

## 2019-10-04 ENCOUNTER — Other Ambulatory Visit: Payer: Self-pay

## 2019-10-04 DIAGNOSIS — M545 Low back pain, unspecified: Secondary | ICD-10-CM

## 2019-10-04 DIAGNOSIS — M25561 Pain in right knee: Secondary | ICD-10-CM

## 2019-10-04 DIAGNOSIS — G8929 Other chronic pain: Secondary | ICD-10-CM | POA: Diagnosis not present

## 2019-10-04 DIAGNOSIS — M79661 Pain in right lower leg: Secondary | ICD-10-CM

## 2019-10-07 ENCOUNTER — Encounter: Payer: Self-pay | Admitting: Physical Therapy

## 2019-10-07 NOTE — Therapy (Signed)
[facility] [address] [place], [place], [address] Phone: [phone]   Fax:  [phone]  Physical Therapy Treatment  Patient Details  Name: Tracy Mckenzie MRN: [id number] Date of Birth: March 27, 1956 Referring Provider (PT): Orma Flaming   Encounter Date: 10/04/2019  PT End of Session - 10/07/19 1508    Visit Number  4    Number of Visits  12    Date for PT Re-Evaluation  10/18/19    Authorization Type  Aetna    PT Start Time  1303    PT Stop Time  1346    PT Time Calculation (min)  43 min    Activity Tolerance  Patient tolerated treatment well    Behavior During Therapy  [facility] for tasks assessed/performed       Past Medical History:  Diagnosis Date  . Arthritis   . Bleeding ulcer   . Elevated cholesterol   . Hiatal hernia   . High blood pressure   . Kidney cysts   . Liver cyst   . Nerve pain    L sciatic pain radiating to R  . Peptic ulcer disease     Past Surgical History:  Procedure Laterality Date  . APPENDECTOMY  1992  . KNEE ARTHROSCOPY Right 04/12/2001   torn cartilage  . LAPAROSCOPY  1981   for endometriosis  . LEG SURGERY Right 11/23/2000   to remove screws  . LEG SURGERY Right 02/08/2002   to remove rod  . MENISCUS REPAIR  01/29/2003  . TIBIA FRACTURE SURGERY Right 06/12/2000   Multiple surgeries  . TONSILLECTOMY  1967  . TOTAL ABDOMINAL HYSTERECTOMY  1992    There were no vitals filed for this visit.  Subjective Assessment - 10/07/19 1507    Subjective  Pt states soreness in L hip/glute today. Had injection in R knee yesterday.    Currently in Pain?  Yes    Pain Score  6     Pain Location  Hip    Pain Orientation  Left    Pain Descriptors / Indicators  Aching;Tightness    Pain Type  Chronic pain    Pain Onset  More than a month ago    Pain Frequency  Intermittent                       [facility] Adult PT Treatment/Exercise - 10/07/19 1506      Exercises   Exercises  Lumbar       Lumbar Exercises: Stretches   Single Knee to Chest Stretch  2 reps;30 seconds;Right;Left    Piriformis Stretch  3 reps;30 seconds    Piriformis Stretch Limitations  seated      Lumbar Exercises: Aerobic   Stationary Bike  L2 x 8 min;       Lumbar Exercises: Supine   Ab Set  --    Clam  20 reps    Clam Limitations  GTB    Bent Knee Raise  --    Bent Knee Raise Limitations  --    Bridge  10 reps    Straight Leg Raise  10 reps    Straight Leg Raises Limitations  with TA       Lumbar Exercises: Sidelying   Hip Abduction  10 reps;Both      Manual Therapy   Manual Therapy  Soft tissue mobilization;Joint mobilization;Manual Traction    Manual therapy comments  skilled palpation and monitoring of soft tissue with dry needling.  Joint Mobilization  PA mobs lumbar spine, SI mobs     Soft tissue mobilization  STM/DTM to L low lumbar region and L glute    Manual Traction  --       Trigger Point Dry Needling - 10/07/19 0001    Consent Given?  Yes    Education Handout Provided  Previously provided    Gluteus Minimus Response  Palpable increased muscle length    Piriformis Response  Palpable increased muscle length             PT Short Term Goals - 09/09/19 1651      PT SHORT TERM GOAL #1   Title  Pt to be indepndent with initial HEP    Time  2    Period  Weeks    Status  New    Target Date  09/20/19        PT Long Term Goals - 09/09/19 1651      PT LONG TERM GOAL #1   Title  Pt to be independent with final HEP for back and R LE    Time  6    Period  Weeks    Status  New    Target Date  10/18/19      PT LONG TERM GOAL #2   Title  Pt to report decreased pain in low back to 0-3/10 with standing activity and IADLs.    Time  6    Period  Weeks    Status  New    Target Date  10/18/19      PT LONG TERM GOAL #3   Title  Pt to demo proper mechanics for sit to stand, squat, and lift, to improve safety and pain with IADLs.    Time  6    Period  Weeks    Status   New    Target Date  10/18/19      PT LONG TERM GOAL #4   Title  Pt to demo improved LE strength to at least 4+/5 to improve ability for IADLs and ambulation.    Time  6    Period  Weeks    Status  New    Target Date  10/21/19            Plan - 10/07/19 1516    Clinical Impression Statement  Pt with soreness in L glute, addressed with manual and dry needling today. Ther ex focus on strengthening and stability for hips and core, pt to benefit from continued care.    Personal Factors and Comorbidities  Past/Current Experience;Comorbidity 1;Comorbidity 2    Comorbidities  2 knee surgeries, Ankle surgery with complications, Osteopenia, OA,    Examination-Activity Limitations  Locomotion Level;Transfers;Squat;Stairs;Lift;Stand    Examination-Participation Restrictions  Church;Cleaning;Community Activity;Driving;Shop;Laundry;Meal Prep    Stability/Clinical Decision Making  Evolving/Moderate complexity    Rehab Potential  Good    PT Frequency  2x / week    PT Duration  6 weeks    PT Treatment/Interventions  ADLs/Self Care Home Management;Cryotherapy;Electrical Stimulation;Gait training;DME Instruction;Contrast Bath;Fluidtherapy;Ultrasound;Traction;Moist Heat;Iontophoresis 4mg /ml Dexamethasone;Stair training;Functional mobility training;Therapeutic activities;Therapeutic exercise;Balance training;Neuromuscular re-education;Manual techniques;Orthotic Fit/Training;Patient/family education;Passive range of motion;Dry needling;Splinting;Joint Manipulations;Spinal Manipulations;Taping;Vasopneumatic Device    Consulted and Agree with Plan of Care  Patient       Patient will benefit from skilled therapeutic intervention in order to improve the following deficits and impairments:  Abnormal gait, Decreased range of motion, Difficulty walking, Increased muscle spasms, Decreased endurance, Decreased activity tolerance, Pain, Improper body mechanics, Impaired  flexibility, Hypomobility, Decreased balance,  Decreased mobility, Decreased strength  Visit Diagnosis: Chronic left-sided low back pain without sciatica  Chronic pain of right knee  Pain in right lower leg     Problem List Patient Active Problem List   Diagnosis Date Noted  . Unilateral primary osteoarthritis, right knee 09/05/2019  . Hyperlipidemia 08/15/2019  . Essential hypertension 08/13/2019  . GERD (gastroesophageal reflux disease) 08/13/2019  . Osteopenia 08/13/2019  . Post-traumatic osteoarthritis of right knee 08/13/2019  . Hiatal hernia 08/13/2019  . Chronic left-sided low back pain without sciatica 08/13/2019    Lyndee Hensen, PT, DPT 3:17 PM  10/07/19     [facility] [place], [place], [address] Phone: [phone]   Fax:  [phone]  Name: Tracy Mckenzie MRN: [id number] Date of Birth: 1956/02/03

## 2019-10-08 ENCOUNTER — Encounter: Payer: Self-pay | Admitting: Physical Therapy

## 2019-10-08 ENCOUNTER — Other Ambulatory Visit: Payer: Self-pay

## 2019-10-08 ENCOUNTER — Ambulatory Visit: Payer: Medicare HMO | Admitting: Physical Therapy

## 2019-10-08 DIAGNOSIS — M25561 Pain in right knee: Secondary | ICD-10-CM | POA: Diagnosis not present

## 2019-10-08 DIAGNOSIS — G8929 Other chronic pain: Secondary | ICD-10-CM | POA: Diagnosis not present

## 2019-10-08 DIAGNOSIS — M545 Low back pain: Secondary | ICD-10-CM | POA: Diagnosis not present

## 2019-10-08 DIAGNOSIS — M79661 Pain in right lower leg: Secondary | ICD-10-CM

## 2019-10-08 NOTE — Therapy (Signed)
[facility] [address] [place], [place], [address] Phone: [phone]   Fax:  [phone]  Physical Therapy Treatment  Patient Details  Name: Tracy Mckenzie MRN: [id number] Date of Birth: 01-Dec-1955 Referring Provider (PT): Orma Flaming   Encounter Date: 10/08/2019  PT End of Session - 10/08/19 1353    Visit Number  5    Number of Visits  12    Date for PT Re-Evaluation  10/18/19    Authorization Type  Aetna    PT Start Time  1346    PT Stop Time  1430    PT Time Calculation (min)  44 min    Activity Tolerance  Patient tolerated treatment well    Behavior During Therapy  [facility] for tasks assessed/performed       Past Medical History:  Diagnosis Date  . Arthritis   . Bleeding ulcer   . Elevated cholesterol   . Hiatal hernia   . High blood pressure   . Kidney cysts   . Liver cyst   . Nerve pain    L sciatic pain radiating to R  . Peptic ulcer disease     Past Surgical History:  Procedure Laterality Date  . APPENDECTOMY  1992  . KNEE ARTHROSCOPY Right 04/12/2001   torn cartilage  . LAPAROSCOPY  1981   for endometriosis  . LEG SURGERY Right 11/23/2000   to remove screws  . LEG SURGERY Right 02/08/2002   to remove rod  . MENISCUS REPAIR  01/29/2003  . TIBIA FRACTURE SURGERY Right 06/12/2000   Multiple surgeries  . TONSILLECTOMY  1967  . TOTAL ABDOMINAL HYSTERECTOMY  1992    There were no vitals filed for this visit.  Subjective Assessment - 10/08/19 1352    Subjective  Pt states continued soreness in L hip and back, as well as R knee being sore today.    Currently in Pain?  Yes    Pain Score  4     Pain Location  Hip    Pain Orientation  Right    Pain Descriptors / Indicators  Aching;Tightness    Pain Type  Chronic pain    Pain Onset  More than a month ago    Pain Frequency  Intermittent                       [facility] Adult PT Treatment/Exercise - 10/08/19 1355      Exercises   Exercises   Lumbar      Lumbar Exercises: Stretches   Single Knee to Chest Stretch  --    Piriformis Stretch  3 reps;30 seconds    Piriformis Stretch Limitations  seated      Lumbar Exercises: Aerobic   Stationary Bike  L2 x 8 min;       Lumbar Exercises: Standing   Other Standing Lumbar Exercises  L and R side glides x15;     Other Standing Lumbar Exercises  L lumbar SB, with therapist assist       Lumbar Exercises: Seated   Long Arc Quad on Chair  20 reps;Both      Lumbar Exercises: Supine   Clam  --    Clam Limitations  --    Bridge  --    Straight Leg Raise  15 reps    Straight Leg Raises Limitations  with TA       Lumbar Exercises: Sidelying   Clam  10 reps;Both    Hip  Abduction  Both;20 reps      Manual Therapy   Manual Therapy  Soft tissue mobilization;Joint mobilization;Manual Traction    Manual therapy comments  --    Joint Mobilization  PA mobs lumbar spine, S/L decompression mobs     Soft tissue mobilization  STM/DTM to L low lumbar region     Manual Traction  long leg distraction on L, for lumbar pump x2 min                PT Short Term Goals - 09/09/19 1651      PT SHORT TERM GOAL #1   Title  Pt to be indepndent with initial HEP    Time  2    Period  Weeks    Status  New    Target Date  09/20/19        PT Long Term Goals - 09/09/19 1651      PT LONG TERM GOAL #1   Title  Pt to be independent with final HEP for back and R LE    Time  6    Period  Weeks    Status  New    Target Date  10/18/19      PT LONG TERM GOAL #2   Title  Pt to report decreased pain in low back to 0-3/10 with standing activity and IADLs.    Time  6    Period  Weeks    Status  New    Target Date  10/18/19      PT LONG TERM GOAL #3   Title  Pt to demo proper mechanics for sit to stand, squat, and lift, to improve safety and pain with IADLs.    Time  6    Period  Weeks    Status  New    Target Date  10/18/19      PT LONG TERM GOAL #4   Title  Pt to demo improved LE  strength to at least 4+/5 to improve ability for IADLs and ambulation.    Time  6    Period  Weeks    Status  New    Target Date  10/21/19            Plan - 10/08/19 1458    Clinical Impression Statement  Pt continues to have soreness in L low back and glute. Pt with much tightness in L vs R lumbar region, and difficulty and pain with L SB/loading. HEP reviewed. Pt having MRI later this week. Pt to benefit from continued care.    Personal Factors and Comorbidities  Past/Current Experience;Comorbidity 1;Comorbidity 2    Comorbidities  2 knee surgeries, Ankle surgery with complications, Osteopenia, OA,    Examination-Activity Limitations  Locomotion Level;Transfers;Squat;Stairs;Lift;Stand    Examination-Participation Restrictions  Church;Cleaning;Community Activity;Driving;Shop;Laundry;Meal Prep    Stability/Clinical Decision Making  Evolving/Moderate complexity    Rehab Potential  Good    PT Frequency  2x / week    PT Duration  6 weeks    PT Treatment/Interventions  ADLs/Self Care Home Management;Cryotherapy;Electrical Stimulation;Gait training;DME Instruction;Contrast Bath;Fluidtherapy;Ultrasound;Traction;Moist Heat;Iontophoresis 4mg /ml Dexamethasone;Stair training;Functional mobility training;Therapeutic activities;Therapeutic exercise;Balance training;Neuromuscular re-education;Manual techniques;Orthotic Fit/Training;Patient/family education;Passive range of motion;Dry needling;Splinting;Joint Manipulations;Spinal Manipulations;Taping;Vasopneumatic Device    Consulted and Agree with Plan of Care  Patient       Patient will benefit from skilled therapeutic intervention in order to improve the following deficits and impairments:  Abnormal gait, Decreased range of motion, Difficulty walking, Increased muscle spasms, Decreased endurance, Decreased activity tolerance,  Pain, Improper body mechanics, Impaired flexibility, Hypomobility, Decreased balance, Decreased mobility, Decreased  strength  Visit Diagnosis: Chronic left-sided low back pain without sciatica  Chronic pain of right knee  Pain in right lower leg     Problem List Patient Active Problem List   Diagnosis Date Noted  . Unilateral primary osteoarthritis, right knee 09/05/2019  . Hyperlipidemia 08/15/2019  . Essential hypertension 08/13/2019  . GERD (gastroesophageal reflux disease) 08/13/2019  . Osteopenia 08/13/2019  . Post-traumatic osteoarthritis of right knee 08/13/2019  . Hiatal hernia 08/13/2019  . Chronic left-sided low back pain without sciatica 08/13/2019    Lyndee Hensen, PT, DPT 3:00 PM  10/08/19    [facility] [place], [place], [address] Phone: [phone]   Fax:  [phone]  Name: Shonell Dutchover MRN: [id number] Date of Birth: 1956/03/10

## 2019-10-10 ENCOUNTER — Encounter: Payer: Self-pay | Admitting: Physical Therapy

## 2019-10-10 ENCOUNTER — Other Ambulatory Visit: Payer: Self-pay

## 2019-10-10 ENCOUNTER — Ambulatory Visit: Payer: Medicare HMO | Admitting: Physical Therapy

## 2019-10-10 DIAGNOSIS — M545 Low back pain, unspecified: Secondary | ICD-10-CM

## 2019-10-10 DIAGNOSIS — G8929 Other chronic pain: Secondary | ICD-10-CM

## 2019-10-10 DIAGNOSIS — M25561 Pain in right knee: Secondary | ICD-10-CM | POA: Diagnosis not present

## 2019-10-10 DIAGNOSIS — M79661 Pain in right lower leg: Secondary | ICD-10-CM

## 2019-10-10 NOTE — Therapy (Signed)
[facility] [address] [place], [place], [address] Phone: [phone]   Fax:  [phone]  Physical Therapy Treatment  Patient Details  Name: Tracy Mckenzie MRN: [id number] Date of Birth: 09-21-1955 Referring Provider (PT): Orma Flaming   Encounter Date: 10/10/2019  PT End of Session - 10/10/19 1445    Visit Number  6    Number of Visits  12    Date for PT Re-Evaluation  10/18/19    Authorization Type  Aetna    PT Start Time  1303    PT Stop Time  1345    PT Time Calculation (min)  42 min    Activity Tolerance  Patient tolerated treatment well    Behavior During Therapy  [facility] for tasks assessed/performed       Past Medical History:  Diagnosis Date  . Arthritis   . Bleeding ulcer   . Elevated cholesterol   . Hiatal hernia   . High blood pressure   . Kidney cysts   . Liver cyst   . Nerve pain    L sciatic pain radiating to R  . Peptic ulcer disease     Past Surgical History:  Procedure Laterality Date  . APPENDECTOMY  1992  . KNEE ARTHROSCOPY Right 04/12/2001   torn cartilage  . LAPAROSCOPY  1981   for endometriosis  . LEG SURGERY Right 11/23/2000   to remove screws  . LEG SURGERY Right 02/08/2002   to remove rod  . MENISCUS REPAIR  01/29/2003  . TIBIA FRACTURE SURGERY Right 06/12/2000   Multiple surgeries  . TONSILLECTOMY  1967  . TOTAL ABDOMINAL HYSTERECTOMY  1992    There were no vitals filed for this visit.  Subjective Assessment - 10/10/19 1309    Subjective  Pt states sore R knee, knee has been catching and giving out more on her lately. L hip sore, but not too bad in last couple days.    Currently in Pain?  Yes    Pain Score  3     Pain Location  Hip    Pain Orientation  Right    Pain Descriptors / Indicators  Aching    Pain Type  Chronic pain    Pain Onset  More than a month ago    Pain Frequency  Intermittent    Multiple Pain Sites  Yes    Pain Score  3    Pain Location  Knee    Pain  Orientation  Right    Pain Descriptors / Indicators  Aching    Pain Type  Chronic pain    Pain Onset  More than a month ago    Pain Frequency  Intermittent                       [facility] Adult PT Treatment/Exercise - 10/10/19 1312      Exercises   Exercises  Lumbar      Lumbar Exercises: Stretches   Piriformis Stretch  3 reps;30 seconds    Piriformis Stretch Limitations  supine    Other Lumbar Stretch Exercise  Standing QL stretch at wall 30 sec x 2 bi;       Lumbar Exercises: Aerobic   Stationary Bike  L2 x 8 min;       Lumbar Exercises: Standing   Functional Squats  10 reps    Functional Squats Limitations  mini squat, with education on form;  x5 with practice for bend/lift, with 4  lb weight from box ;  Sit to stand x5 with educatoin on mechanics and hip hinge motion     Other Standing Lumbar Exercises  March, Hip abd x20 each bil;     Other Standing Lumbar Exercises  --      Lumbar Exercises: Seated   Long Arc Quad on Chair  20 reps;Both      Lumbar Exercises: Supine   Straight Leg Raise  --    Straight Leg Raises Limitations  --      Lumbar Exercises: Sidelying   Clam  --    Hip Abduction  Both;20 reps      Manual Therapy   Manual Therapy  Soft tissue mobilization;Joint mobilization;Manual Traction    Joint Mobilization  PA mobs lumbar spine, S/L decompression mobs     Soft tissue mobilization  STM/roller to L glute    Manual Traction  long leg distraction on L, for lumbar pump x2 min                PT Short Term Goals - 09/09/19 1651      PT SHORT TERM GOAL #1   Title  Pt to be indepndent with initial HEP    Time  2    Period  Weeks    Status  New    Target Date  09/20/19        PT Long Term Goals - 09/09/19 1651      PT LONG TERM GOAL #1   Title  Pt to be independent with final HEP for back and R LE    Time  6    Period  Weeks    Status  New    Target Date  10/18/19      PT LONG TERM GOAL #2   Title  Pt to report decreased  pain in low back to 0-3/10 with standing activity and IADLs.    Time  6    Period  Weeks    Status  New    Target Date  10/18/19      PT LONG TERM GOAL #3   Title  Pt to demo proper mechanics for sit to stand, squat, and lift, to improve safety and pain with IADLs.    Time  6    Period  Weeks    Status  New    Target Date  10/18/19      PT LONG TERM GOAL #4   Title  Pt to demo improved LE strength to at least 4+/5 to improve ability for IADLs and ambulation.    Time  6    Period  Weeks    Status  New    Target Date  10/21/19            Plan - 10/10/19 1451    Clinical Impression Statement  Pt having MRI tomorrow. Will discuss plan for continuing PT next week with pt, after MRI. She continues to have pain in L low lumbar and into R buttock region. Ther ex focus has been on decompression and flexion bias exercises. Discussed posture, and practiced bend/squat mechanics today. Pt with difficulty with this, due to pain in knees with squat motion. Pt to benefit from continued care.    Personal Factors and Comorbidities  Past/Current Experience;Comorbidity 1;Comorbidity 2    Comorbidities  2 knee surgeries, Ankle surgery with complications, Osteopenia, OA,    Examination-Activity Limitations  Locomotion Level;Transfers;Squat;Stairs;Lift;Stand    Examination-Participation Restrictions  Church;Cleaning;Community Activity;Driving;Shop;Laundry;Meal Prep  Stability/Clinical Decision Making  Evolving/Moderate complexity    Rehab Potential  Good    PT Frequency  2x / week    PT Duration  6 weeks    PT Treatment/Interventions  ADLs/Self Care Home Management;Cryotherapy;Electrical Stimulation;Gait training;DME Instruction;Contrast Bath;Fluidtherapy;Ultrasound;Traction;Moist Heat;Iontophoresis 4mg /ml Dexamethasone;Stair training;Functional mobility training;Therapeutic activities;Therapeutic exercise;Balance training;Neuromuscular re-education;Manual techniques;Orthotic  Fit/Training;Patient/family education;Passive range of motion;Dry needling;Splinting;Joint Manipulations;Spinal Manipulations;Taping;Vasopneumatic Device    Consulted and Agree with Plan of Care  Patient       Patient will benefit from skilled therapeutic intervention in order to improve the following deficits and impairments:  Abnormal gait, Decreased range of motion, Difficulty walking, Increased muscle spasms, Decreased endurance, Decreased activity tolerance, Pain, Improper body mechanics, Impaired flexibility, Hypomobility, Decreased balance, Decreased mobility, Decreased strength  Visit Diagnosis: Chronic left-sided low back pain without sciatica  Chronic pain of right knee  Pain in right lower leg     Problem List Patient Active Problem List   Diagnosis Date Noted  . Unilateral primary osteoarthritis, right knee 09/05/2019  . Hyperlipidemia 08/15/2019  . Essential hypertension 08/13/2019  . GERD (gastroesophageal reflux disease) 08/13/2019  . Osteopenia 08/13/2019  . Post-traumatic osteoarthritis of right knee 08/13/2019  . Hiatal hernia 08/13/2019  . Chronic left-sided low back pain without sciatica 08/13/2019    Lyndee Hensen, PT, DPT 2:56 PM  10/10/19    [facility] [place], [place], [address] Phone: [phone]   Fax:  [phone]  Name: Tracy Mckenzie MRN: [id number] Date of Birth: 1956/01/05

## 2019-10-11 ENCOUNTER — Other Ambulatory Visit: Payer: Self-pay | Admitting: Family Medicine

## 2019-10-11 ENCOUNTER — Ambulatory Visit
Admission: RE | Admit: 2019-10-11 | Discharge: 2019-10-11 | Disposition: A | Discharge status: Home / Self Care | Payer: Medicare HMO | Source: Ambulatory Visit | Attending: Orthopaedic Surgery | Admitting: Orthopaedic Surgery

## 2019-10-11 DIAGNOSIS — M4807 Spinal stenosis, lumbosacral region: Secondary | ICD-10-CM

## 2019-10-11 DIAGNOSIS — M48061 Spinal stenosis, lumbar region without neurogenic claudication: Secondary | ICD-10-CM | POA: Diagnosis not present

## 2019-10-11 NOTE — Telephone Encounter (Signed)
LAST APPOINTMENT DATE: 08/13/2019 NEXT APPOINTMENT DATE:10/16/2019  Rx Tramadol  LAST REFILL:08/13/2019 QTY:#30 0Rf   Rx Flexeril 10mg  LAST REFILL: 08/13/2019 QTY #30 0Rf

## 2019-10-16 ENCOUNTER — Ambulatory Visit (INDEPENDENT_AMBULATORY_CARE_PROVIDER_SITE_OTHER): Payer: Medicare HMO | Admitting: Physical Therapy

## 2019-10-16 ENCOUNTER — Other Ambulatory Visit: Payer: Self-pay

## 2019-10-16 DIAGNOSIS — M79661 Pain in right lower leg: Secondary | ICD-10-CM

## 2019-10-16 DIAGNOSIS — M545 Low back pain, unspecified: Secondary | ICD-10-CM

## 2019-10-16 DIAGNOSIS — G8929 Other chronic pain: Secondary | ICD-10-CM | POA: Diagnosis not present

## 2019-10-16 DIAGNOSIS — M25561 Pain in right knee: Secondary | ICD-10-CM

## 2019-10-16 NOTE — Patient Instructions (Signed)
Access Code: [email or web address] URL: [email or web address] Date: 10/16/2019 Prepared by: Lyndee Hensen  Exercises Standing Sidebending with Chair Support - 2 x daily - 3 reps - 30 hold Supine Single Knee to Chest Stretch - 2 x daily - 3 reps - 30 hold Supine Figure 4 Piriformis Stretch - 2 x daily - 3 reps - 30 hold Supine Posterior Pelvic Tilt - 2 x daily - 10 reps - 2 sets Seated Hamstring Stretch - 2 x daily - 3 reps - 30 hold Seated Knee Extension AROM - 1 x daily - 10 reps - 2 sets Supine March - 1 x daily - 2 sets - 10 reps Supine Bridge - 1 x daily - 2 sets - 10 reps Straight Leg Raise - 1 x daily - 10 reps - 2 sets Sidelying Hip Abduction - 1 x daily - 10 reps - 2 sets Standing Hip Abduction - 2 x daily - 10 reps - 2 sets

## 2019-10-17 ENCOUNTER — Encounter: Payer: Self-pay | Admitting: Physical Therapy

## 2019-10-17 NOTE — Therapy (Signed)
[facility] [address] [place], [place], [address] Phone: [phone]   Fax:  [phone]  Physical Therapy Treatment  Patient Details  Name: Tracy Mckenzie MRN: [id number] Date of Birth: 03/10/1956 Referring Provider (PT): Orma Flaming   Encounter Date: 10/16/2019  PT End of Session - 10/17/19 0844    Visit Number  7    Number of Visits  12    Date for PT Re-Evaluation  10/18/19    Authorization Type  Aetna    PT Start Time  1515    PT Stop Time  1600    PT Time Calculation (min)  45 min    Activity Tolerance  Patient tolerated treatment well    Behavior During Therapy  [facility] for tasks assessed/performed       Past Medical History:  Diagnosis Date  . Arthritis   . Bleeding ulcer   . Elevated cholesterol   . Hiatal hernia   . High blood pressure   . Kidney cysts   . Liver cyst   . Nerve pain    L sciatic pain radiating to R  . Peptic ulcer disease     Past Surgical History:  Procedure Laterality Date  . APPENDECTOMY  1992  . KNEE ARTHROSCOPY Right 04/12/2001   torn cartilage  . LAPAROSCOPY  1981   for endometriosis  . LEG SURGERY Right 11/23/2000   to remove screws  . LEG SURGERY Right 02/08/2002   to remove rod  . MENISCUS REPAIR  01/29/2003  . TIBIA FRACTURE SURGERY Right 06/12/2000   Multiple surgeries  . TONSILLECTOMY  1967  . TOTAL ABDOMINAL HYSTERECTOMY  1992    There were no vitals filed for this visit.  Subjective Assessment - 10/17/19 0842    Subjective  Pt had MRI. Results show severe stenosis. Pt states mild pain relief, but back/hip pain is still bothersome. R knee has also been bothersome to her, catching with activity .    Currently in Pain?  Yes    Pain Score  3     Pain Location  Hip    Pain Orientation  Right    Pain Descriptors / Indicators  Aching    Pain Type  Chronic pain    Pain Onset  More than a month ago    Pain Frequency  Intermittent                                PT Education - 10/17/19 0844    Education Details  HEP updated, reviewed in detail.    Person(s) Educated  Patient    Methods  Explanation;Demonstration;Verbal cues;Tactile cues;Handout    Comprehension  Verbalized understanding;Returned demonstration;Verbal cues required;Need further instruction       PT Short Term Goals - 10/17/19 0845      PT SHORT TERM GOAL #1   Title  Pt to be indepndent with initial HEP    Time  2    Period  Weeks    Status  Achieved    Target Date  09/20/19        PT Long Term Goals - 09/09/19 1651      PT LONG TERM GOAL #1   Title  Pt to be independent with final HEP for back and R LE    Time  6    Period  Weeks    Status  New    Target Date  10/18/19  PT LONG TERM GOAL #2   Title  Pt to report decreased pain in low back to 0-3/10 with standing activity and IADLs.    Time  6    Period  Weeks    Status  New    Target Date  10/18/19      PT LONG TERM GOAL #3   Title  Pt to demo proper mechanics for sit to stand, squat, and lift, to improve safety and pain with IADLs.    Time  6    Period  Weeks    Status  New    Target Date  10/18/19      PT LONG TERM GOAL #4   Title  Pt to demo improved LE strength to at least 4+/5 to improve ability for IADLs and ambulation.    Time  6    Period  Weeks    Status  New    Target Date  10/21/19            Plan - 10/17/19 0945    Clinical Impression Statement  Pt has mild increase in knee pain and instability recently. Discussed best ther ex to continue. HEP reviewed, updated today for best exercises for back and stenosis. D/C plan discussed, pt to be seen for 1 additional visit this week, then plan to d/c to HEP. Pt with continued soreness in L glute and low back.    Personal Factors and Comorbidities  Past/Current Experience;Comorbidity 1;Comorbidity 2    Comorbidities  2 knee surgeries, Ankle surgery with complications, Osteopenia, OA,     Examination-Activity Limitations  Locomotion Level;Transfers;Squat;Stairs;Lift;Stand    Examination-Participation Restrictions  Church;Cleaning;Community Activity;Driving;Shop;Laundry;Meal Prep    Stability/Clinical Decision Making  Evolving/Moderate complexity    Rehab Potential  Good    PT Frequency  2x / week    PT Duration  6 weeks    PT Treatment/Interventions  ADLs/Self Care Home Management;Cryotherapy;Electrical Stimulation;Gait training;DME Instruction;Contrast Bath;Fluidtherapy;Ultrasound;Traction;Moist Heat;Iontophoresis 4mg /ml Dexamethasone;Stair training;Functional mobility training;Therapeutic activities;Therapeutic exercise;Balance training;Neuromuscular re-education;Manual techniques;Orthotic Fit/Training;Patient/family education;Passive range of motion;Dry needling;Splinting;Joint Manipulations;Spinal Manipulations;Taping;Vasopneumatic Device    Consulted and Agree with Plan of Care  Patient       Patient will benefit from skilled therapeutic intervention in order to improve the following deficits and impairments:  Abnormal gait, Decreased range of motion, Difficulty walking, Increased muscle spasms, Decreased endurance, Decreased activity tolerance, Pain, Improper body mechanics, Impaired flexibility, Hypomobility, Decreased balance, Decreased mobility, Decreased strength  Visit Diagnosis: Chronic left-sided low back pain without sciatica  Chronic pain of right knee  Pain in right lower leg     Problem List Patient Active Problem List   Diagnosis Date Noted  . Unilateral primary osteoarthritis, right knee 09/05/2019  . Hyperlipidemia 08/15/2019  . Essential hypertension 08/13/2019  . GERD (gastroesophageal reflux disease) 08/13/2019  . Osteopenia 08/13/2019  . Post-traumatic osteoarthritis of right knee 08/13/2019  . Hiatal hernia 08/13/2019  . Chronic left-sided low back pain without sciatica 08/13/2019    Lyndee Hensen, PT, DPT 9:49 AM  10/17/19    [facility]  [place], [place], [address] Phone: [phone]   Fax:  [phone]  Name: Tracy Mckenzie MRN: [id number] Date of Birth: April 28, 1956

## 2019-10-18 ENCOUNTER — Encounter: Payer: Medicare HMO | Admitting: Physical Therapy

## 2019-10-23 ENCOUNTER — Encounter: Payer: Self-pay | Admitting: Physical Therapy

## 2019-10-23 ENCOUNTER — Other Ambulatory Visit: Payer: Self-pay

## 2019-10-23 ENCOUNTER — Ambulatory Visit (INDEPENDENT_AMBULATORY_CARE_PROVIDER_SITE_OTHER): Payer: Medicare HMO | Admitting: Physical Therapy

## 2019-10-23 DIAGNOSIS — M545 Low back pain, unspecified: Secondary | ICD-10-CM

## 2019-10-23 DIAGNOSIS — M79661 Pain in right lower leg: Secondary | ICD-10-CM

## 2019-10-23 DIAGNOSIS — M25561 Pain in right knee: Secondary | ICD-10-CM | POA: Diagnosis not present

## 2019-10-23 DIAGNOSIS — G8929 Other chronic pain: Secondary | ICD-10-CM

## 2019-10-25 ENCOUNTER — Encounter: Payer: Medicare HMO | Admitting: Physical Therapy

## 2019-11-02 ENCOUNTER — Encounter: Payer: Self-pay | Admitting: Physical Therapy

## 2019-11-02 NOTE — Therapy (Signed)
[facility] [address] [place], [place], [address] Phone: [phone]   Fax:  [phone]  Physical Therapy Treatment/Discharge    Patient Details  Name: Tracy Mckenzie MRN: [id number] Date of Birth: 21-Aug-1955 Referring Provider (PT): Orma Flaming   Encounter Date: 10/23/2019  PT End of Session - 11/02/19 1335    Visit Number  8    Number of Visits  12    Date for PT Re-Evaluation  10/23/19    Authorization Type  Aetna    PT Start Time  1345    PT Stop Time  1430    PT Time Calculation (min)  45 min    Activity Tolerance  Patient tolerated treatment well    Behavior During Therapy  [facility] for tasks assessed/performed       Past Medical History:  Diagnosis Date  . Arthritis   . Bleeding ulcer   . Elevated cholesterol   . Hiatal hernia   . High blood pressure   . Kidney cysts   . Liver cyst   . Nerve pain    L sciatic pain radiating to R  . Peptic ulcer disease     Past Surgical History:  Procedure Laterality Date  . APPENDECTOMY  1992  . KNEE ARTHROSCOPY Right 04/12/2001   torn cartilage  . LAPAROSCOPY  1981   for endometriosis  . LEG SURGERY Right 11/23/2000   to remove screws  . LEG SURGERY Right 02/08/2002   to remove rod  . MENISCUS REPAIR  01/29/2003  . TIBIA FRACTURE SURGERY Right 06/12/2000   Multiple surgeries  . TONSILLECTOMY  1967  . TOTAL ABDOMINAL HYSTERECTOMY  1992    There were no vitals filed for this visit.  Subjective Assessment - 11/02/19 1330    Subjective  Pt doing well with HEP. She has improved pain overall, still sore at times, in glute region. Knee also still bothersome.    Patient Stated Goals  Decreased pain in back and R LE.    Currently in Pain?  Yes    Pain Score  3     Pain Location  Hip    Pain Orientation  Right    Pain Descriptors / Indicators  Aching    Pain Type  Chronic pain    Pain Onset  More than a month ago    Pain Frequency  Intermittent    Pain Score  3    Pain Location  Knee    Pain Orientation  Right    Pain Descriptors / Indicators  Aching    Pain Type  Chronic pain    Pain Onset  More than a month ago    Pain Frequency  Intermittent         [facility] PT Assessment - 11/02/19 0001      AROM   Lumbar Flexion  wnl    Lumbar Extension  mild limtation    Lumbar - Right Side Bend  [facility]    Lumbar - Left Side [facility]      Strength   Overall Strength Comments  4+/5                    [facility] Adult PT Treatment/Exercise - 11/02/19 0001      Exercises   Exercises  Lumbar      Lumbar Exercises: Stretches   Piriformis Stretch  3 reps;30 seconds    Piriformis Stretch Limitations  supine    Other Lumbar Stretch Exercise  Standing QL stretch at wall 30 sec x 2 bi;       Lumbar Exercises: Aerobic   Stationary Bike  L2 x 8 min;       Lumbar Exercises: Standing   Other Standing Lumbar Exercises  March, Hip abd x20 each bil;       Lumbar Exercises: Seated   Long Arc Quad on Chair  20 reps;Both      Lumbar Exercises: Supine   Ab Set  20 reps    Bent Knee Raise  20 reps    Bent Knee Raise Limitations  with TA    Bridge  10 reps    Straight Leg Raise  15 reps    Straight Leg Raises Limitations  with TA       Lumbar Exercises: Sidelying   Hip Abduction  Both;20 reps      Manual Therapy   Manual Therapy  Soft tissue mobilization;Joint mobilization;Manual Traction    Soft tissue mobilization  STM/roller to L glute             PT Education - 11/02/19 1347    Education Details  Final HEP reviewed in detail,    Person(s) Educated  Patient    Methods  Explanation;Demonstration;Tactile cues;Verbal cues    Comprehension  Verbalized understanding;Returned demonstration;Verbal cues required       PT Short Term Goals - 11/02/19 1337      PT SHORT TERM GOAL #1   Title  Pt to be indepndent with initial HEP    Time  2    Period  Weeks    Status  Achieved    Target Date  09/20/19        PT Long Term Goals -  11/02/19 1338      PT LONG TERM GOAL #1   Title  Pt to be independent with final HEP for back and R LE    Time  6    Period  Weeks    Status  Achieved      PT LONG TERM GOAL #2   Title  Pt to report decreased pain in low back to 0-3/10 with standing activity and IADLs.    Time  6    Period  Weeks    Status  Achieved      PT LONG TERM GOAL #3   Title  Pt to demo proper mechanics for sit to stand, squat, and lift, to improve safety and pain with IADLs.    Time  6    Period  Weeks    Status  Achieved      PT LONG TERM GOAL #4   Title  Pt to demo improved LE strength to at least 4+/5 to improve ability for IADLs and ambulation.    Time  6    Period  Weeks    Status  Achieved            Plan - 11/02/19 1339    Clinical Impression Statement Pt missed last appt (PT scheduling issue) POC extended for one additional visit, today, for final visit.  Final HEP reviewed in detial today. Pt with improved pain and mobility overall in hips, back, and core. She has much improved ability for LE and core strengthening and HEP. R knee is bothersome to pt, due to instability and feeling of apprehension with some activities. she has improved ROM in lumbar spine, and improved strength of LEs. She continues to have mild soreness in back and into L glute  that is improved, but has not been eliminated. Pt ready for d/c to HEP, pt in agreement with plan.    Personal Factors and Comorbidities  Past/Current Experience;Comorbidity 1;Comorbidity 2    Comorbidities  2 knee surgeries, Ankle surgery with complications, Osteopenia, OA,    Examination-Activity Limitations  Locomotion Level;Transfers;Squat;Stairs;Lift;Stand    Examination-Participation Restrictions  Church;Cleaning;Community Activity;Driving;Shop;Laundry;Meal Prep    Stability/Clinical Decision Making  Evolving/Moderate complexity    Rehab Potential  Good    PT Frequency  2x / week    PT Duration  6 weeks    PT Treatment/Interventions   ADLs/Self Care Home Management;Cryotherapy;Electrical Stimulation;Gait training;DME Instruction;Contrast Bath;Fluidtherapy;Ultrasound;Traction;Moist Heat;Iontophoresis 47m/ml Dexamethasone;Stair training;Functional mobility training;Therapeutic activities;Therapeutic exercise;Balance training;Neuromuscular re-education;Manual techniques;Orthotic Fit/Training;Patient/family education;Passive range of motion;Dry needling;Splinting;Joint Manipulations;Spinal Manipulations;Taping;Vasopneumatic Device    Consulted and Agree with Plan of Care  Patient       Patient will benefit from skilled therapeutic intervention in order to improve the following deficits and impairments:  Abnormal gait, Decreased range of motion, Difficulty walking, Increased muscle spasms, Decreased endurance, Decreased activity tolerance, Pain, Improper body mechanics, Impaired flexibility, Hypomobility, Decreased balance, Decreased mobility, Decreased strength  Visit Diagnosis: Chronic left-sided low back pain without sciatica  Chronic pain of right knee  Pain in right lower leg   Final HEP: code: [id number] to list individual exercises, due to computer difficulty today.  Pt has copy of full list and pictures.     Problem List Patient Active Problem List   Diagnosis Date Noted  . Unilateral primary osteoarthritis, right knee 09/05/2019  . Hyperlipidemia 08/15/2019  . Essential hypertension 08/13/2019  . GERD (gastroesophageal reflux disease) 08/13/2019  . Osteopenia 08/13/2019  . Post-traumatic osteoarthritis of right knee 08/13/2019  . Hiatal hernia 08/13/2019  . Chronic left-sided low back pain without sciatica 08/13/2019    LLyndee Hensen PT, DPT 1:53 PM  11/02/19   [place] [address]: [phone]  Fax:  [phone] Name: Tracy SosnowskiMRN: [id number] of Birth: 6October 18, 1957    PHYSICAL THERAPY DISCHARGE  SUMMARY Visits:  8  Plan: Patient agrees to discharge.  Patient goals were partially met. Patient is being discharged due to meeting the stated rehab goals.  ?????      LLyndee Hensen PT, DPT 1:53 PM  11/02/19

## 2019-11-06 ENCOUNTER — Encounter: Payer: Self-pay | Admitting: Family Medicine

## 2019-11-09 ENCOUNTER — Other Ambulatory Visit: Payer: Self-pay

## 2019-11-09 MED ORDER — LOSARTAN POTASSIUM 100 MG PO TABS: 100.0000 mg | ORAL_TABLET | Freq: Every day | ORAL | 3 refills | Status: DC

## 2019-11-12 ENCOUNTER — Telehealth: Payer: Self-pay

## 2019-11-12 MED ORDER — LOSARTAN POTASSIUM 100 MG PO TABS: 100.0000 mg | ORAL_TABLET | Freq: Every day | ORAL | 3 refills | Status: DC

## 2019-11-12 NOTE — Telephone Encounter (Signed)
Received a fax from [facility] stating they needed clarification on which dose of Losartan the patient is suppose to be taking. Per Dr. Shelby Mattocks last office note patient is taking losartan (COZAAR) 100 MG tablet. A new script has been sent to the patient's pharmacy.

## 2019-11-14 ENCOUNTER — Ambulatory Visit
Admission: RE | Admit: 2019-11-14 | Discharge: 2019-11-14 | Disposition: A | Discharge status: Home / Self Care | Payer: Medicare HMO | Source: Ambulatory Visit | Attending: Family Medicine | Admitting: Family Medicine

## 2019-11-14 ENCOUNTER — Other Ambulatory Visit: Payer: Self-pay | Admitting: Family Medicine

## 2019-11-14 ENCOUNTER — Other Ambulatory Visit: Payer: Self-pay

## 2019-11-14 DIAGNOSIS — Z1231 Encounter for screening mammogram for malignant neoplasm of breast: Secondary | ICD-10-CM

## 2019-11-14 DIAGNOSIS — M8580 Other specified disorders of bone density and structure, unspecified site: Secondary | ICD-10-CM

## 2019-11-14 DIAGNOSIS — Z780 Asymptomatic menopausal state: Secondary | ICD-10-CM | POA: Diagnosis not present

## 2019-11-14 DIAGNOSIS — M810 Age-related osteoporosis without current pathological fracture: Secondary | ICD-10-CM | POA: Diagnosis not present

## 2019-11-14 MED ORDER — LOSARTAN POTASSIUM 50 MG PO TABS: ORAL_TABLET | ORAL | 1 refills | Status: DC

## 2019-11-16 ENCOUNTER — Encounter: Payer: Self-pay | Admitting: Family Medicine

## 2019-11-23 ENCOUNTER — Encounter: Payer: Self-pay | Admitting: Family Medicine

## 2019-11-23 NOTE — Telephone Encounter (Signed)
Please Advise

## 2019-11-28 ENCOUNTER — Telehealth: Payer: Self-pay

## 2019-11-28 NOTE — Telephone Encounter (Signed)
-----   Message from Orma Flaming, MD sent at 11/26/2019  3:26 PM EDT ----- Please set this patient up for prolia for osteoporosis. Has been on fosamax in the past and couldn't tolerate due to extreme GERD.  Thanks!  Dr. Rogers Blocker

## 2019-11-28 NOTE — Telephone Encounter (Signed)
Patient's benefit's sent to [employer] to be set up for Prolia.

## 2019-12-06 ENCOUNTER — Encounter: Payer: Self-pay | Admitting: Family Medicine

## 2019-12-06 DIAGNOSIS — M79661 Pain in right lower leg: Secondary | ICD-10-CM

## 2019-12-06 NOTE — Telephone Encounter (Signed)
Please advise 

## 2019-12-07 ENCOUNTER — Telehealth: Payer: Self-pay

## 2019-12-07 NOTE — Telephone Encounter (Signed)
Pt called to follow up on medication refills for Cyclobenzaprine and Tramadol. I made patient aware that approval needs to be sent before sending to pharmacy. Pt verbalized understanding.   LOV: 08/13/2019 Next Visit: 02/13/2020  Approve?

## 2019-12-10 MED ORDER — TRAMADOL-ACETAMINOPHEN 37.5-325 MG PO TABS: 1.0000 | ORAL_TABLET | Freq: Three times a day (TID) | ORAL | 0 refills | Status: DC | PRN

## 2019-12-10 MED ORDER — CYCLOBENZAPRINE HCL 10 MG PO TABS: 10.0000 mg | ORAL_TABLET | Freq: Three times a day (TID) | ORAL | 0 refills | Status: DC | PRN

## 2019-12-10 NOTE — Addendum Note (Signed)
Addended by: Orma Flaming on: 12/10/2019 09:41 AM   Modules accepted: Orders

## 2019-12-10 NOTE — Telephone Encounter (Signed)
Refilled medications.  Tracy Flaming, MD [name]

## 2020-01-10 ENCOUNTER — Encounter: Payer: Self-pay | Admitting: Family Medicine

## 2020-01-10 NOTE — Telephone Encounter (Signed)
LVM for patient to call the office back to get scheduled.

## 2020-01-10 NOTE — Telephone Encounter (Signed)
Please schedule an appointment for pt.   Thank You

## 2020-01-11 ENCOUNTER — Other Ambulatory Visit: Payer: Self-pay

## 2020-01-11 ENCOUNTER — Encounter: Payer: Self-pay | Admitting: Family Medicine

## 2020-01-11 ENCOUNTER — Ambulatory Visit (INDEPENDENT_AMBULATORY_CARE_PROVIDER_SITE_OTHER): Payer: Medicare HMO | Admitting: Family Medicine

## 2020-01-11 VITALS — BP 150/84 | HR 85 | Temp 98.2°F | Ht 66.0 in | Wt 177.8 lb

## 2020-01-11 DIAGNOSIS — J302 Other seasonal allergic rhinitis: Secondary | ICD-10-CM

## 2020-01-11 DIAGNOSIS — R0982 Postnasal drip: Secondary | ICD-10-CM

## 2020-01-11 MED ORDER — MONTELUKAST SODIUM 10 MG PO TABS: 10.0000 mg | ORAL_TABLET | Freq: Every day | ORAL | 0 refills | Status: DC

## 2020-01-11 MED ORDER — PREDNISONE 20 MG PO TABS: 40.0000 mg | ORAL_TABLET | Freq: Every day | ORAL | 0 refills | Status: DC

## 2020-01-11 MED ORDER — AZELASTINE HCL 0.1 % NA SOLN: 1.0000 | Freq: Two times a day (BID) | NASAL | 12 refills | Status: DC

## 2020-01-11 NOTE — Progress Notes (Signed)
Patient: Tracy Mckenzie MRN: [id number] DOB: [birth year] PCP: Orma Flaming, MD     Subjective:  Chief Complaint  Patient presents with  . Cough    5-6 months  . Nasal Congestion    drainage    HPI: The patient is a 64 y.o. female who presents today for coughing due to sinus drainage. She states she had sinus issues since [place]. She has had nasal stuffiness, post nasal drip with cough and congestion. She has never had fever/chills. She takes OTC cough drops, Xyzal and [place] cold and flu. If she doesn't use medication she can not sleep. She won't use flonase.    Review of Systems  Constitutional: Negative for chills, fatigue and fever.  HENT: Positive for congestion and postnasal drip. Negative for sinus pressure, sinus pain and sore throat.   Respiratory: Positive for cough. Negative for wheezing.   Cardiovascular: Negative for chest pain and palpitations.  Neurological: Negative for dizziness, light-headedness and headaches.    Allergies Patient is allergic to meperidine, simvastatin, oxycodone-acetaminophen, and vicodin [hydrocodone-acetaminophen].  Past Medical History Patient  has a past medical history of Arthritis, Bleeding ulcer, Elevated cholesterol, Hiatal hernia, High blood pressure, Kidney cysts, Liver cyst, Nerve pain, and Peptic ulcer disease.  Surgical History Patient  has a past surgical history that includes Appendectomy (1992); Tonsillectomy (1967); laparoscopy (1981); Total abdominal hysterectomy (1992); Tibia fracture surgery (Right, 06/12/2000); Leg Surgery (Right, 11/23/2000); Knee arthroscopy (Right, 04/12/2001); Leg Surgery (Right, 02/08/2002); Meniscus repair (01/29/2003); and Breast cyst aspiration (Left, 2003).  Family History Pateint's family history includes Breast cancer in her maternal aunt and paternal aunt; COPD in her father and mother; Diabetes in her maternal grandfather; Kidney disease in her mother; Stroke in her paternal  grandfather.  Social History Patient  reports that she has never smoked. She has never used smokeless tobacco. She reports that she does not drink alcohol and does not use drugs.    Objective: Vitals:   01/11/20 1453 01/11/20 1528  BP: (!) 152/90 (!) 150/84  Pulse: 85   Temp: 98.2 F (36.8 C)   TempSrc: Temporal   SpO2: 94%   Weight: 177 lb 12.8 oz (80.6 kg)   Height: 5\' 6"  (1.676 m)     Body mass index is 28.7 kg/m.  Physical Exam Vitals reviewed.  Constitutional:      General: She is not in acute distress.    Appearance: Normal appearance. She is normal weight. She is not ill-appearing.  HENT:     Head: Normocephalic and atraumatic.     Comments: No ttp over sinuses     Right Ear: Tympanic membrane, ear canal and external ear normal.     Left Ear: Tympanic membrane, ear canal and external ear normal.     Nose: Congestion present.     Comments: Edematous left nasal turbinate     Mouth/Throat:     Comments: Cobblestoning on posterior pharynx  Eyes:     Extraocular Movements: Extraocular movements intact.     Conjunctiva/sclera: Conjunctivae normal.     Pupils: Pupils are equal, round, and reactive to light.  Cardiovascular:     Rate and Rhythm: Normal rate and regular rhythm.     Heart sounds: Normal heart sounds.  Pulmonary:     Effort: Pulmonary effort is normal. No respiratory distress.     Breath sounds: Normal breath sounds. No wheezing or rales.  Abdominal:     General: Abdomen is flat. Bowel sounds are normal.     Palpations: Abdomen is  soft.  Musculoskeletal:     Cervical back: Normal range of motion and neck supple.  Lymphadenopathy:     Cervical: No cervical adenopathy.  Skin:    Capillary Refill: Capillary refill takes less than 2 seconds.     Findings: No rash.  Neurological:     General: No focal deficit present.     Mental Status: She is alert and oriented to person, place, and time.  Psychiatric:        Behavior: Behavior normal.         Assessment/plan: 1. Seasonal allergic rhinitis, unspecified trigger Has reservations about flonase so will do trial of astelin. Continue xyzal and adding on singulair. Want her to stop her otc cough medication as I think it's elevating her blood pressure. If this doesn't work will have her do a steroid burst. Discussed if symptoms continue will need to see allergist.   2. Post-nasal drip Starting her on astelin as she doesn't want to do a steroid spray. Likely cause of cough, but discussed if still having cough despite max allergy treatment we will need to xray. I see her back in a month or she can email before.   -never heard about prolia, will send our [place] another message to get this set up for her.   This visit occurred during the SARS-CoV-2 public health emergency.  Safety protocols were in place, including screening questions prior to the visit, additional usage of staff PPE, and extensive cleaning of exam room while observing appropriate contact time as indicated for disinfecting solutions.     Return if symptoms worsen or fail to improve.   Orma Flaming, MD [place]   01/11/2020

## 2020-01-11 NOTE — Patient Instructions (Addendum)
1) sending in prescription allergy medicaiton called singulair. Take this once a day. If taking xyzal at night, could try this in the am.   2) continue xyzal at night.  3) sending in a non steroidal nasal spray called astelin. Use this twice a day.  4) if not better with the above in a few days can  Start steroids. Sending in a burst for you so it's 2 pills/day for 5 days, but I would prefer you not have to take these if the above medication works.   5) if still coughing email me so I can check CXR.   Let me know if any issues! Have a great weekend! Dr. Rogers Blocker

## 2020-01-17 ENCOUNTER — Encounter: Payer: Self-pay | Admitting: Family Medicine

## 2020-01-21 ENCOUNTER — Telehealth: Payer: Self-pay

## 2020-01-21 NOTE — Telephone Encounter (Signed)
Benefits sent to [facility] for Prolia.

## 2020-01-24 ENCOUNTER — Other Ambulatory Visit: Payer: Self-pay | Admitting: Family Medicine

## 2020-01-24 MED ORDER — AZITHROMYCIN 250 MG PO TABS: ORAL_TABLET | ORAL | 0 refills | Status: DC

## 2020-01-24 NOTE — Progress Notes (Signed)
z

## 2020-01-25 NOTE — Telephone Encounter (Signed)
Pt is requesting Cyclobenzaprine 10mg   LOV: 01/11/2020 Next Visit: 02/13/2020 Last refill: 12/10/2019  Approve?

## 2020-01-29 ENCOUNTER — Telehealth: Payer: Self-pay

## 2020-01-29 NOTE — Telephone Encounter (Signed)
PA submitted for Prolia via CoverMyMeds KEY: [id number]

## 2020-01-30 ENCOUNTER — Other Ambulatory Visit: Payer: Self-pay | Admitting: Family Medicine

## 2020-01-30 DIAGNOSIS — R059 Cough, unspecified: Secondary | ICD-10-CM

## 2020-01-30 MED ORDER — ALBUTEROL SULFATE HFA 108 (90 BASE) MCG/ACT IN AERS
2.0000 | INHALATION_SPRAY | Freq: Four times a day (QID) | RESPIRATORY_TRACT | 1 refills | Status: DC | PRN
Start: 2020-01-30 — End: 2020-11-27

## 2020-01-31 ENCOUNTER — Ambulatory Visit (INDEPENDENT_AMBULATORY_CARE_PROVIDER_SITE_OTHER)
Admission: RE | Admit: 2020-01-31 | Discharge: 2020-01-31 | Disposition: A | Discharge status: Home / Self Care | Payer: Medicare HMO | Source: Ambulatory Visit | Attending: Family Medicine | Admitting: Family Medicine

## 2020-01-31 ENCOUNTER — Other Ambulatory Visit: Payer: Self-pay

## 2020-01-31 DIAGNOSIS — R05 Cough: Secondary | ICD-10-CM

## 2020-01-31 DIAGNOSIS — R059 Cough, unspecified: Secondary | ICD-10-CM

## 2020-02-01 ENCOUNTER — Other Ambulatory Visit: Payer: Self-pay | Admitting: Family Medicine

## 2020-02-01 DIAGNOSIS — R053 Chronic cough: Secondary | ICD-10-CM

## 2020-02-08 ENCOUNTER — Telehealth: Payer: Self-pay

## 2020-02-08 NOTE — Telephone Encounter (Signed)
Pt has been approved by [facility] for Prolia injections. Pt can now make arrangements to start treatment.

## 2020-02-08 NOTE — Telephone Encounter (Signed)
I spoke with the patient to give her the message below. Pt says that when speaking to [place], she was told the price was $250, and would like to know if she could make payments. I told her that I would forward her concerns to our [place] for further questions, she is not in office today, but will return on next week. Pt voiced understanding.

## 2020-02-08 NOTE — Telephone Encounter (Signed)
I left patient a voicemail yesterday. Does she know how much she will have to pay? She would have to pay $265 OOP. She never returned my call.

## 2020-02-08 NOTE — Telephone Encounter (Signed)
Noted. I will try to reach out to pt.

## 2020-02-11 NOTE — Telephone Encounter (Signed)
Do you know if patient is able to make payments on OOP cost for Prolia or if the whole balance is due at one time?

## 2020-02-13 ENCOUNTER — Telehealth: Payer: Medicare HMO | Admitting: Family Medicine

## 2020-02-18 NOTE — Telephone Encounter (Signed)
Could we check with the prolia rep on assistant programs available to patients?

## 2020-03-02 ENCOUNTER — Other Ambulatory Visit: Payer: Self-pay | Admitting: Family Medicine

## 2020-03-07 ENCOUNTER — Ambulatory Visit (INDEPENDENT_AMBULATORY_CARE_PROVIDER_SITE_OTHER): Payer: Medicare HMO | Admitting: Family Medicine

## 2020-03-07 ENCOUNTER — Other Ambulatory Visit: Payer: Self-pay

## 2020-03-07 ENCOUNTER — Encounter: Payer: Self-pay | Admitting: Family Medicine

## 2020-03-07 VITALS — BP 128/70 | HR 81 | Temp 98.3°F | Resp 18 | Ht 66.0 in | Wt 178.4 lb

## 2020-03-07 DIAGNOSIS — I10 Essential (primary) hypertension: Secondary | ICD-10-CM

## 2020-03-07 DIAGNOSIS — E782 Mixed hyperlipidemia: Secondary | ICD-10-CM | POA: Diagnosis not present

## 2020-03-07 DIAGNOSIS — M8580 Other specified disorders of bone density and structure, unspecified site: Secondary | ICD-10-CM

## 2020-03-07 MED ORDER — EZETIMIBE 10 MG PO TABS: 10.0000 mg | ORAL_TABLET | Freq: Every day | ORAL | 3 refills | Status: DC

## 2020-03-07 MED ORDER — DENOSUMAB 60 MG/ML ~~LOC~~ SOSY
60.0000 mg | PREFILLED_SYRINGE | Freq: Once | SUBCUTANEOUS | Status: AC
  Administered 2020-03-07: 60 mg via SUBCUTANEOUS

## 2020-03-07 MED ORDER — PRO COMFORT SPACER ADULT MISC: 1.0000 | 1 refills | Status: AC | PRN

## 2020-03-07 MED ORDER — TRAMADOL-ACETAMINOPHEN 37.5-325 MG PO TABS: 1.0000 | ORAL_TABLET | Freq: Three times a day (TID) | ORAL | 0 refills | Status: DC | PRN

## 2020-03-07 NOTE — Patient Instructions (Signed)
F/u in 6 months with fasting labs  -sent in new cholesterol drug called zetia. You take this once/day. Please let me know if any issues!    You're doing great! Enjoy the rest of your summer!  -oh schedule colonoscopy!   Dr. Rogers Blocker

## 2020-03-07 NOTE — Progress Notes (Signed)
Patient: Tracy Mckenzie MRN: [id number] DOB: 02/26/1956 PCP: Orma Flaming, MD     Subjective:  Chief Complaint  Patient presents with  . Hypertension    Home blood pressure readings have been elevated. She thinks her machine may be malfunctioning.   Mar[place] Kitchen Hyperlipidemia    HPI: The patient is a 64 y.o. female who presents today for hypertension and hyperlipidemia follow up.   Hypertension: Here for follow up of hypertension.  Currently on cozaar 72m BID and hctz 12.54mdaily. Home readings range from 12546-503yTWSFKCLE/75iastolic. Takes medication as prescribed and denies any side effects. Exercise includes none due to pain. Weight has been stable. Denies any chest pain, headaches, shortness of breath, vision changes, swelling in lower extremities.   Hyperlipidemia [place] due to rash.   Chronic cough Has appointment with pulmonology in September.   Getting prolia shot today.   The 10-year ASCVD risk score (GMikey BussingC Jr., et al., 2013) is: 8.7%   Review of Systems  Constitutional: Negative for chills, fatigue and fever.  HENT: Negative for dental problem, ear pain, hearing loss and trouble swallowing.   Eyes: Negative for visual disturbance.  Respiratory: Negative for cough, chest tightness and shortness of breath.   Cardiovascular: Negative for chest pain, palpitations and leg swelling.  Gastrointestinal: Negative for abdominal pain, blood in stool, diarrhea and nausea.  Endocrine: Negative for cold intolerance, polydipsia, polyphagia and polyuria.  Genitourinary: Negative for dysuria and hematuria.  Musculoskeletal: Positive for arthralgias.  Skin: Negative for rash.  Neurological: Negative for dizziness and headaches.  Psychiatric/Behavioral: Negative for dysphoric mood and sleep disturbance. The patient is not nervous/anxious.     Allergies Patient is allergic to meperidine, simvastatin, oxycodone-acetaminophen,  and vicodin [hydrocodone-acetaminophen].  Past Medical History Patient  has a past medical history of Arthritis, Bleeding ulcer, Elevated cholesterol, Hiatal hernia, High blood pressure, Kidney cysts, Liver cyst, Nerve pain, and Peptic ulcer disease.  Surgical History Patient  has a past surgical history that includes Appendectomy (1992); Tonsillectomy (1967); laparoscopy (1981); Total abdominal hysterectomy (1992); Tibia fracture surgery (Right, 06/12/2000); Leg Surgery (Right, 11/23/2000); Knee arthroscopy (Right, 04/12/2001); Leg Surgery (Right, 02/08/2002); Meniscus repair (01/29/2003); and Breast cyst aspiration (Left, 2003).  Family History Pateint's family history includes Breast cancer in her maternal aunt and paternal aunt; COPD in her father and mother; Diabetes in her maternal grandfather; Kidney disease in her mother; Stroke in her paternal grandfather.  Social History Patient  reports that she has never smoked. She has never used smokeless tobacco. She reports that she does not drink alcohol and does not use drugs.    Objective: Vitals:   03/07/20 0809  BP: 128/70  Pulse: 81  Resp: 18  Temp: 98.3 F (36.8 C)  TempSrc: Temporal  SpO2: 94%  Weight: 178 lb 6.4 oz (80.9 kg)  Height: 5' 6"  (1.676 m)    Body mass index is 28.79 kg/m.  Physical Exam Vitals reviewed.  Constitutional:      Appearance: Normal appearance. She is well-developed. She is obese.  HENT:     Head: Normocephalic and atraumatic.     Right Ear: Tympanic membrane, ear canal and external ear normal.     Left Ear: Tympanic membrane, ear canal and external ear normal.     Nose: Nose normal.     Mouth/Throat:     Mouth: Mucous membranes are moist.  Eyes:     Conjunctiva/sclera: Conjunctivae normal.  Pupils: Pupils are equal, round, and reactive to light.  Neck:     Thyroid: No thyromegaly.     Vascular: No carotid bruit.  Cardiovascular:     Rate and Rhythm: Normal rate and regular rhythm.      Pulses: Normal pulses.     Heart sounds: Normal heart sounds. No murmur heard.   Pulmonary:     Effort: Pulmonary effort is normal.     Breath sounds: Normal breath sounds.  Abdominal:     General: Abdomen is flat. Bowel sounds are normal. There is no distension.     Palpations: Abdomen is soft.     Tenderness: There is no abdominal tenderness.  Musculoskeletal:     Cervical back: Normal range of motion and neck supple.  Lymphadenopathy:     Cervical: No cervical adenopathy.  Skin:    General: Skin is warm and dry.     Capillary Refill: Capillary refill takes less than 2 seconds.     Findings: No rash.  Neurological:     General: No focal deficit present.     Mental Status: She is alert and oriented to person, place, and time.     Cranial Nerves: No cranial nerve deficit.     Coordination: Coordination normal.     Deep Tendon Reflexes: Reflexes normal.  Psychiatric:        Mood and Affect: Mood normal.        Behavior: Behavior normal.      Office Visit from 08/13/2019 in [facility]  PHQ-2 Total Score 0         Assessment/plan: 1. Essential hypertension Blood pressure is to goal. Continue current anti-hypertensive medications. Refills given and routine lab work will be done today. Recommended routine exercise and healthy diet including DASH diet and mediterranean diet. Encouraged weight loss. F/u in 6 months.    - CBC with Differential/Platelet - Comprehensive metabolic panel - Comp Met (CMET) - CBC with Differential/Platelet  2. Mixed hyperlipidemia Will do trial of zetia. She is to let me know if any issues.   3. Osteopenia, unspecified location prolia today.  - denosumab (PROLIA) injection 60 mg    This visit occurred during the SARS-CoV-2 public health emergency.  Safety protocols were in place, including screening questions prior to the visit, additional usage of staff PPE, and extensive cleaning of exam room while observing  appropriate contact time as indicated for disinfecting solutions.    Return in about 6 months (around 09/07/2020) for regular f/u with fasting labs .     Orma Flaming, MD [place]  03/07/2020

## 2020-03-08 LAB — CBC WITH DIFFERENTIAL/PLATELET
Absolute Monocytes: 732 cells/uL (ref 200–950)
Basophils Absolute: 61 cells/uL (ref 0–200)
Basophils Relative: 0.5 %
Eosinophils Absolute: 244 cells/uL (ref 15–500)
Eosinophils Relative: 2 %
HCT: 35.9 % (ref 35.0–45.0)
Hemoglobin: 12.2 g/dL (ref 11.7–15.5)
Lymphs Abs: 2147 cells/uL (ref 850–3900)
MCH: 29.4 pg (ref 27.0–33.0)
MCHC: 34 g/dL (ref 32.0–36.0)
MCV: 86.5 fL (ref 80.0–100.0)
MPV: 10 fL (ref 7.5–12.5)
Monocytes Relative: 6 %
Neutro Abs: 9016 cells/uL — ABNORMAL HIGH (ref 1500–7800)
Neutrophils Relative %: 73.9 %
Platelets: 355 10*3/uL (ref 140–400)
RBC: 4.15 10*6/uL (ref 3.80–5.10)
RDW: 13.1 % (ref 11.0–15.0)
Total Lymphocyte: 17.6 %
WBC: 12.2 10*3/uL — ABNORMAL HIGH (ref 3.8–10.8)

## 2020-03-08 LAB — COMPREHENSIVE METABOLIC PANEL
AG Ratio: 1.6 (calc) (ref 1.0–2.5)
ALT: 15 U/L (ref 6–29)
AST: 16 U/L (ref 10–35)
Albumin: 4.1 g/dL (ref 3.6–5.1)
Alkaline phosphatase (APISO): 133 U/L (ref 37–153)
BUN: 15 mg/dL (ref 7–25)
CO2: 27 mmol/L (ref 20–32)
Calcium: 9.6 mg/dL (ref 8.6–10.4)
Chloride: 103 mmol/L (ref 98–110)
Creat: 0.92 mg/dL (ref 0.50–0.99)
Globulin: 2.5 g/dL (calc) (ref 1.9–3.7)
Glucose, Bld: 97 mg/dL (ref 65–99)
Potassium: 4.1 mmol/L (ref 3.5–5.3)
Sodium: 141 mmol/L (ref 135–146)
Total Bilirubin: 0.4 mg/dL (ref 0.2–1.2)
Total Protein: 6.6 g/dL (ref 6.1–8.1)

## 2020-03-10 ENCOUNTER — Encounter: Payer: Self-pay | Admitting: Family Medicine

## 2020-03-11 DIAGNOSIS — R69 Illness, unspecified: Secondary | ICD-10-CM | POA: Diagnosis not present

## 2020-03-18 ENCOUNTER — Other Ambulatory Visit: Payer: Medicare HMO

## 2020-03-18 ENCOUNTER — Other Ambulatory Visit: Payer: Self-pay

## 2020-03-18 DIAGNOSIS — D72829 Elevated white blood cell count, unspecified: Secondary | ICD-10-CM | POA: Diagnosis not present

## 2020-03-18 DIAGNOSIS — I10 Essential (primary) hypertension: Secondary | ICD-10-CM | POA: Diagnosis not present

## 2020-03-18 NOTE — Addendum Note (Signed)
Addended by: Liliane Channel on: 03/18/2020 10:52 AM   Modules accepted: Orders

## 2020-03-20 ENCOUNTER — Other Ambulatory Visit: Payer: Self-pay | Admitting: Family Medicine

## 2020-03-20 DIAGNOSIS — D72829 Elevated white blood cell count, unspecified: Secondary | ICD-10-CM

## 2020-03-20 LAB — CBC WITH DIFFERENTIAL/PLATELET
Absolute Monocytes: 677 cells/uL (ref 200–950)
Basophils Absolute: 44 cells/uL (ref 0–200)
Basophils Relative: 0.4 %
Eosinophils Absolute: 211 cells/uL (ref 15–500)
Eosinophils Relative: 1.9 %
HCT: 37.9 % (ref 35.0–45.0)
Hemoglobin: 12.8 g/dL (ref 11.7–15.5)
Lymphs Abs: 2242 cells/uL (ref 850–3900)
MCH: 29.5 pg (ref 27.0–33.0)
MCHC: 33.8 g/dL (ref 32.0–36.0)
MCV: 87.3 fL (ref 80.0–100.0)
MPV: 9.8 fL (ref 7.5–12.5)
Monocytes Relative: 6.1 %
Neutro Abs: 7925 cells/uL — ABNORMAL HIGH (ref 1500–7800)
Neutrophils Relative %: 71.4 %
Platelets: 383 10*3/uL (ref 140–400)
RBC: 4.34 10*6/uL (ref 3.80–5.10)
RDW: 12.4 % (ref 11.0–15.0)
Total Lymphocyte: 20.2 %
WBC: 11.1 10*3/uL — ABNORMAL HIGH (ref 3.8–10.8)

## 2020-03-20 LAB — TEST AUTHORIZATION

## 2020-03-20 LAB — COMPREHENSIVE METABOLIC PANEL
AG Ratio: 2 (calc) (ref 1.0–2.5)
ALT: 26 U/L (ref 6–29)
AST: 22 U/L (ref 10–35)
Albumin: 4.3 g/dL (ref 3.6–5.1)
Alkaline phosphatase (APISO): 127 U/L (ref 37–153)
BUN: 15 mg/dL (ref 7–25)
CO2: 29 mmol/L (ref 20–32)
Calcium: 9.5 mg/dL (ref 8.6–10.4)
Chloride: 104 mmol/L (ref 98–110)
Creat: 0.9 mg/dL (ref 0.50–0.99)
Globulin: 2.1 g/dL (calc) (ref 1.9–3.7)
Glucose, Bld: 103 mg/dL — ABNORMAL HIGH (ref 65–99)
Potassium: 4.4 mmol/L (ref 3.5–5.3)
Sodium: 140 mmol/L (ref 135–146)
Total Bilirubin: 0.3 mg/dL (ref 0.2–1.2)
Total Protein: 6.4 g/dL (ref 6.1–8.1)

## 2020-03-20 LAB — PATHOLOGIST SMEAR REVIEW

## 2020-03-21 ENCOUNTER — Telehealth: Payer: Self-pay | Admitting: Hematology and Oncology

## 2020-03-21 NOTE — Telephone Encounter (Signed)
Received a new hem referral from Dr. Rogers Blocker for leukocytosis. Tracy Mckenzie has been cld and scheduled to see Dr. Lorenso Courier on 9/13 at 9am. Pt aware to arrive 15 minutes early.

## 2020-03-23 ENCOUNTER — Other Ambulatory Visit: Payer: Self-pay | Admitting: Family Medicine

## 2020-03-27 DIAGNOSIS — R69 Illness, unspecified: Secondary | ICD-10-CM | POA: Diagnosis not present

## 2020-04-08 ENCOUNTER — Encounter: Payer: Self-pay | Admitting: Family Medicine

## 2020-04-12 NOTE — Progress Notes (Signed)
[facility] [phone]   Fax:([phone]) [place] NOTE  Patient Care Team: Orma Flaming, MD as PCP - General (Family Medicine)  Hematological/Oncological History # Leukocytosis/ Neutrophilia 08/13/2019: WBC 14.2, Hgb 13.0, MCV 87.8, Plt 406 03/07/2020: WBC 12.2, Hgb 12.2, MCV 86.5, Plt 355 03/18/2020: WBC 11.1, Hgb 12.8, MCV 87.3, Plt 383 04/14/2020: establish care with Dr. Lorenso Courier   CHIEF COMPLAINTS/PURPOSE OF CONSULTATION:  "Leukocytosis "  HISTORY OF PRESENTING ILLNESS:  Tracy Mckenzie 64 y.o. female with medical history significant for HTN, GERD, osteoporosis, and osteoarthritis who presents for evaluation of longstanding leukocytosis.   On review of the previous records Tracy Mckenzie has a history of leukocytosis dating back to at least 08/13/2019.  At that time she was noted to have white blood cell count of 14.2, and platelets of 406.  More recently on 03/07/2020 the patient had a white blood cell count of 12.2 and on 03/18/2020 the patient had white blood cell count of 11.1.  Due to concern for this chronically elevated neutrophil count the patient was referred to hematology for further evaluation and management.  On exam today Tracy Mckenzie over the last year she has had chronic issues with recurrent cough and possible sinus infections.  She has been tried on multiple therapies including steroid therapy as well as Z-Paks.  Despite this she has not had any issues with fevers, chills, sweats, nausea, vomiting or diarrhea.  She was on a cholesterol medication which caused her to develop a rash, however this medication was ultimately discontinued and she has had no further issues with rash.  She notes that she is otherwise had no other concerning changes in her health.  On further review she notes that she has a family history remarkable for essential thrombocytosis in the maternal aunt.  She also notes that her sister had non-Hodgkin's lymphoma and that  her mother had thyroid disease requiring thyroid resection.  Her social history is remarkable for her being a never smoker and never drinker.  She notes that she does have issues with snoring as well as some occasional daytime somnolence.  In terms of systemic inflammation she does not have any rheumatological conditions though she notes that she does have chronic pain in the spine requiring 5 surgeries and is due for a knee replacement.  Otherwise a full 10 point ROS is listed below.  MEDICAL HISTORY:  Past Medical History:  Diagnosis Date  . Arthritis   . Bleeding ulcer   . Elevated cholesterol   . Hiatal hernia   . High blood pressure   . Kidney cysts   . Liver cyst   . Nerve pain    L sciatic pain radiating to R  . Peptic ulcer disease     SURGICAL HISTORY: Past Surgical History:  Procedure Laterality Date  . APPENDECTOMY  1992  . BREAST CYST ASPIRATION Left 2003  . KNEE ARTHROSCOPY Right 04/12/2001   torn cartilage  . LAPAROSCOPY  1981   for endometriosis  . LEG SURGERY Right 11/23/2000   to remove screws  . LEG SURGERY Right 02/08/2002   to remove rod  . MENISCUS REPAIR  01/29/2003  . TIBIA FRACTURE SURGERY Right 06/12/2000   Multiple surgeries  . TONSILLECTOMY  1967  . TOTAL ABDOMINAL HYSTERECTOMY  1992    SOCIAL HISTORY: Social History   Socioeconomic History  . Marital status: Married    Spouse name: Not on file  . Number of children: Not on file  . Years  of education: Not on file  . Highest education level: Not on file  Occupational History  . Not on file  Tobacco Use  . Smoking status: Never Smoker  . Smokeless tobacco: Never Used  Substance and Sexual Activity  . Alcohol use: No  . Drug use: No  . Sexual activity: Not on file  Other Topics Concern  . Not on file  Social History Narrative  . Not on file   Social Determinants of Health   Financial Resource Strain:   . Difficulty of Paying Living Expenses: Not on file  Food Insecurity:   .  Worried About Running Out of Food in the Last Year: Not on file  . Ran Out of Food in the Last Year: Not on file  Transportation Needs:   . Lack of Transportation (Medical): Not on file  . Lack of Transportation (Non-Medical): Not on file  Physical Activity:   . Days of Exercise per Week: Not on file  . Minutes of Exercise per Session: Not on file  Stress:   . Feeling of Stress : Not on file  Social Connections:   . Frequency of Communication with Friends and Family: Not on file  . Frequency of Social Gatherings with Friends and Family: Not on file  . Attends Religious Services: Not on file  . Active Member of Clubs or Organizations: Not on file  . Attends Club or Organization Meetings: Not on file  . Marital Status: Not on file  Intimate Partner Violence:   . Fear of Current or Ex-Partner: Not on file  . Emotionally Abused: Not on file  . Physically Abused: Not on file  . Sexually Abused: Not on file    FAMILY HISTORY: Family History  Problem Relation Age of Onset  . Kidney disease Mother   . COPD Mother   . Breast cancer Maternal Aunt   . Breast cancer Paternal Aunt   . COPD Father   . Diabetes Maternal Grandfather   . Stroke Paternal Grandfather   . Colon cancer Neg Hx     ALLERGIES:  is allergic to meperidine, simvastatin, oxycodone-acetaminophen, and vicodin [hydrocodone-acetaminophen].  MEDICATIONS:  Current Outpatient Medications  Medication Sig Dispense Refill  . albuterol (VENTOLIN HFA) 108 (90 Base) MCG/ACT inhaler Inhale 2 puffs into the lungs every 6 (six) hours as needed for wheezing or shortness of breath. 18 g 1  . azelastine (ASTELIN) 0.1 % nasal spray Place 1 spray into both nostrils 2 (two) times daily. Use in each nostril as directed 30 mL 12  . COCONUT OIL PO Take 1,000 mg by mouth daily.     . Coenzyme Q10 (CO Q-10) 100 MG CAPS Take 1 tablet by mouth daily.    . Cyanocobalamin (VITAMIN B-12) 5000 MCG TBDP Take 1 tablet by mouth daily.     .  cyclobenzaprine (FLEXERIL) 10 MG tablet TAKE ONE TABLET BY MOUTH THREE TIMES A DAY AS NEEDED FOR MUSCLE SPASMS 30 tablet 0  . hydrochlorothiazide (MICROZIDE) 12.5 MG capsule Take 1 capsule (12.5 mg total) by mouth daily. 90 capsule 3  . levocetirizine (XYZAL) 5 MG tablet Take 5 mg by mouth daily.    . losartan (COZAAR) 50 MG tablet Take one tablet twice a day for blood pressure 180 tablet 1  . magnesium 30 MG tablet Take 3 tablets by mouth daily.    . montelukast (SINGULAIR) 10 MG tablet TAKE ONE TABLET BY MOUTH AT BEDTIME 90 tablet 0  . nitroGLYCERIN (NITROSTAT) 0.4 MG SL tablet   Place under the tongue.    . Nutritional Supplements (JUICE PLUS FIBRE PO) Take by mouth.    Marland Kitchen omeprazole (PRILOSEC) 40 MG capsule TAKE ONE CAPSULE BY MOUTH EVERY DAY 30 MINUTES BEFORE BREAkfast 90 capsule 3  . Potassium 99 MG TABS Take 1 tablet by mouth daily.    Marland Kitchen Spacer/Aero-Holding Chambers (PRO COMFORT SPACER ADULT) MISC 1 Device by Does not apply route as needed. 1 each 1  . traMADol-acetaminophen (ULTRACET) 37.5-325 MG tablet Take 1 tablet by mouth every 8 (eight) hours as needed. 30 tablet 0   Current Facility-Administered Medications  Medication Dose Route Frequency Provider Last Rate Last Admin  . 0.9 %  sodium chloride infusion  500 mL Intravenous Continuous Danis, Estill Cotta III, MD        REVIEW OF SYSTEMS:   Constitutional: ( - ) fevers, ( - )  chills , ( - ) night sweats Eyes: ( - ) blurriness of vision, ( - ) double vision, ( - ) watery eyes Ears, nose, mouth, throat, and face: ( - ) mucositis, ( - ) sore throat Respiratory: ( - ) cough, ( - ) dyspnea, ( - ) wheezes Cardiovascular: ( - ) palpitation, ( - ) chest discomfort, ( - ) lower extremity swelling Gastrointestinal:  ( - ) nausea, ( - ) heartburn, ( - ) change in bowel habits Skin: ( - ) abnormal skin rashes Lymphatics: ( - ) new lymphadenopathy, ( - ) easy bruising Neurological: ( - ) numbness, ( - ) tingling, ( - ) new  weaknesses Behavioral/Psych: ( - ) mood change, ( - ) new changes  All other systems were reviewed with the patient and are negative.  PHYSICAL EXAMINATION: ECOG PERFORMANCE STATUS: 1 - Symptomatic but completely ambulatory  Vitals:   04/14/20 0912  BP: (!) 152/78  Pulse: 84  Resp: 20  Temp: (!) 97.4 F (36.3 C)  SpO2: 99%   Filed Weights   04/14/20 0912  Weight: 177 lb 3.2 oz (80.4 kg)    GENERAL: well appearing elderly Caucasian female in NAD  SKIN: skin color, texture, turgor are normal, no rashes or significant lesions EYES: conjunctiva are pink and non-injected, sclera clear LUNGS: clear to auscultation and percussion with normal breathing effort HEART: regular rate & rhythm and no murmurs and no lower extremity edema Musculoskeletal: no cyanosis of digits and no clubbing  PSYCH: alert & oriented x 3, fluent speech NEURO: no focal motor/sensory deficits  LABORATORY DATA:  I have reviewed the data as listed CBC Latest Ref Rng & Units 04/14/2020 03/18/2020 03/07/2020  WBC 4.0 - 10.5 K/uL 12.7(H) 11.1(H) 12.2(H)  Hemoglobin 12.0 - 15.0 g/dL 13.0 12.8 12.2  Hematocrit 36 - 46 % 39.9 37.9 35.9  Platelets 150 - 400 K/uL 375 383 355    CMP Latest Ref Rng & Units 04/14/2020 03/18/2020 03/07/2020  Glucose 70 - 99 mg/dL 100(H) 103(H) 97  BUN 8 - 23 mg/dL _0 Creatinine 0.44 - 1.00 mg/dL 0.91 0.90 0.92  Sodium 135 - 145 mmol/L 142 140 141  Potassium 3.5 - 5.1 mmol/L 4.3 4.4 4.1  Chloride 98 - 111 mmol/L 106 104 103  CO2 22 - 32 mmol/L _1 Calcium 8.9 - 10.3 mg/dL 9.5 9.5 9.6  Total Protein 6.5 - 8.1 g/dL 7.4 6.4 6.6  Total Bilirubin 0.3 - 1.2 mg/dL 0.4 0.3 0.4  Alkaline Phos 38 - 126 U/L 149(H) - -  AST 15 - 41 U/L 19 22  16  ALT 0 - 44 U/L 23 26 15    PATHOLOGY: None relevant to review.   BLOOD FILM:  Review of the peripheral blood smear showed normal appearing white cells with neutrophils that were appropriately lobated and granulated. There was no  predominance of bi-lobed or hyper-segmented neutrophils appreciated. No Dohle bodies were noted. There was no left shifting, immature forms or blasts noted. Neutrophils did appear reactive. Lymphocytes remain normal in size without any predominance of large granular lymphocytes. Red cells show no anisopoikilocytosis, macrocytes , microcytes or polychromasia. There were no schistocytes, target cells, echinocytes, acanthocytes, dacrocytes, or stomatocytes.There was no rouleaux formation, nucleated red cells, or intra-cellular inclusions noted. The platelets are normal in size, shape, and color without any clumping evident.  RADIOGRAPHIC STUDIES: No results found.  ASSESSMENT & PLAN Tracy Mckenzie 64 y.o. female with medical history significant for HTN, GERD, osteoporosis, and osteoarthritis who presents for evaluation of longstanding leukocytosis.  After review the labs, review the records, discussion with the patient the findings are most consistent with an elevated neutrophil count of unclear etiology.  There are typically 3 primary causes for neutrophilia including infection, inflammation, and bone marrow dysfunction.  Infection is certainly a possibility in this patient with chronic sinus issues and may be a driving cause.  Inflammation may also be an etiology given her back and knee pain in order to assess this we will review her inflammatory markers.  Bone marrow disorder is certainly a consideration which we will try to rule out with JAK2 with reflex as well as BCR abl PCR.  The patient is not currently on any medications known to cause leukocytosis.  We will plan to have the patient return to clinic in 3 months time unless an actionable cause of her leukocytosis is noted during her blood work today.  #Leukocytosis/Neutrophilic Predominance --findings are most consistent with a low grade neutrophilia, likely due to inflammation. Unlikely to represent infection (though she has had chronic sinus issues).  MPN a consideration --will order CBC, CMP, ESR, and CRP --will review a peripheral blood film --MPN r/o with JAK2 w/ reflex and BCR ABL --no signs of hematological malignancy, no need for a BmBx at this time.  --patient is up to date on her cancer screenings (last mammogram in April 2021, last colonoscopy 2014)  --RTC in 3 months time or sooner if further evaluation if required based on the above labs.   Orders Placed This Encounter  Procedures  . CBC with Differential (Cancer Center Only)    Standing Status:   Future    Number of Occurrences:   1    Standing Expiration Date:   04/14/2021  . Save Smear (SSMR)    Standing Status:   Future    Number of Occurrences:   1    Standing Expiration Date:   04/14/2021  . CMP (Cancer Center only)    Standing Status:   Future    Number of Occurrences:   1    Standing Expiration Date:   04/14/2021  . Sedimentation rate    Standing Status:   Future    Number of Occurrences:   1    Standing Expiration Date:   04/14/2021  . JAK2 (INCLUDING V617F AND EXON 12), MPL,& CALR W/RFL MPN PANEL (NGS)    Standing Status:   Future    Number of Occurrences:   1    Standing Expiration Date:   04/14/2021  . BCR ABL1 FISH ([facility])    Standing Status:     Future    Number of Occurrences:   1    Standing Expiration Date:   04/14/2021    All questions were answered. The patient knows to call the clinic with any problems, questions or concerns.  A total of more than 60 minutes were spent on this encounter and over half of that time was spent on counseling and coordination of care as outlined above.   John T. Dorsey, MD Department of Hematology/Oncology [facility] Phone: [phone] Pager: [phone] Email: [email or web address]  04/14/2020 2:54 PM  

## 2020-04-14 ENCOUNTER — Inpatient Hospital Stay: Payer: Medicare HMO

## 2020-04-14 ENCOUNTER — Other Ambulatory Visit: Payer: Self-pay

## 2020-04-14 ENCOUNTER — Inpatient Hospital Stay: Payer: Medicare HMO | Attending: Hematology and Oncology | Admitting: Hematology and Oncology

## 2020-04-14 VITALS — BP 152/78 | HR 84 | Temp 97.4°F | Resp 20 | Ht 66.0 in | Wt 177.2 lb

## 2020-04-14 DIAGNOSIS — D72829 Elevated white blood cell count, unspecified: Secondary | ICD-10-CM | POA: Diagnosis present

## 2020-04-14 DIAGNOSIS — M1990 Unspecified osteoarthritis, unspecified site: Secondary | ICD-10-CM | POA: Diagnosis not present

## 2020-04-14 DIAGNOSIS — D72825 Bandemia: Secondary | ICD-10-CM

## 2020-04-14 DIAGNOSIS — I10 Essential (primary) hypertension: Secondary | ICD-10-CM | POA: Insufficient documentation

## 2020-04-14 DIAGNOSIS — M810 Age-related osteoporosis without current pathological fracture: Secondary | ICD-10-CM | POA: Diagnosis not present

## 2020-04-14 LAB — CMP (CANCER CENTER ONLY)
ALT: 23 U/L (ref 0–44)
AST: 19 U/L (ref 15–41)
Albumin: 3.9 g/dL (ref 3.5–5.0)
Alkaline Phosphatase: 149 U/L — ABNORMAL HIGH (ref 38–126)
Anion gap: 6 (ref 5–15)
BUN: 12 mg/dL (ref 8–23)
CO2: 30 mmol/L (ref 22–32)
Calcium: 9.5 mg/dL (ref 8.9–10.3)
Chloride: 106 mmol/L (ref 98–111)
Creatinine: 0.91 mg/dL (ref 0.44–1.00)
GFR, Est AFR Am: >60 mL/min (ref >60)
GFR, Estimated: >60 mL/min (ref >60)
Glucose, Bld: 100 mg/dL — ABNORMAL HIGH (ref 70–99)
Potassium: 4.3 mmol/L (ref 3.5–5.1)
Sodium: 142 mmol/L (ref 135–145)
Total Bilirubin: 0.4 mg/dL (ref 0.3–1.2)
Total Protein: 7.4 g/dL (ref 6.5–8.1)

## 2020-04-14 LAB — CBC WITH DIFFERENTIAL (CANCER CENTER ONLY)
Abs Immature Granulocytes: 0.05 10*3/uL (ref 0.00–0.07)
Basophils Absolute: 0.1 10*3/uL (ref 0.0–0.1)
Basophils Relative: 1 %
Eosinophils Absolute: 0.2 10*3/uL (ref 0.0–0.5)
Eosinophils Relative: 2 %
HCT: 39.9 % (ref 36.0–46.0)
Hemoglobin: 13 g/dL (ref 12.0–15.0)
Immature Granulocytes: 0 %
Lymphocytes Relative: 19 %
Lymphs Abs: 2.5 10*3/uL (ref 0.7–4.0)
MCH: 29.1 pg (ref 26.0–34.0)
MCHC: 32.6 g/dL (ref 30.0–36.0)
MCV: 89.5 fL (ref 80.0–100.0)
Monocytes Absolute: 0.7 10*3/uL (ref 0.1–1.0)
Monocytes Relative: 6 %
Neutro Abs: 9.2 10*3/uL — ABNORMAL HIGH (ref 1.7–7.7)
Neutrophils Relative %: 72 %
Platelet Count: 375 10*3/uL (ref 150–400)
RBC: 4.46 MIL/uL (ref 3.87–5.11)
RDW: 13.2 % (ref 11.5–15.5)
WBC Count: 12.7 10*3/uL — ABNORMAL HIGH (ref 4.0–10.5)
nRBC: 0 % (ref 0.0–0.2)

## 2020-04-14 LAB — SAVE SMEAR(SSMR), FOR PROVIDER SLIDE REVIEW

## 2020-04-14 LAB — SEDIMENTATION RATE: Sed Rate: 16 mm/hr (ref 0–22)

## 2020-04-15 ENCOUNTER — Telehealth: Payer: Self-pay | Admitting: Hematology and Oncology

## 2020-04-15 NOTE — Telephone Encounter (Signed)
Scheduled per los. Called and left msg. Mailed printout  °

## 2020-04-16 ENCOUNTER — Encounter: Payer: Self-pay | Admitting: Pulmonary Disease

## 2020-04-16 ENCOUNTER — Ambulatory Visit (INDEPENDENT_AMBULATORY_CARE_PROVIDER_SITE_OTHER): Payer: Medicare HMO | Admitting: Pulmonary Disease

## 2020-04-16 ENCOUNTER — Other Ambulatory Visit: Payer: Self-pay

## 2020-04-16 VITALS — BP 122/78 | HR 82 | Temp 93.7°F | Ht 67.0 in | Wt 177.2 lb

## 2020-04-16 DIAGNOSIS — R05 Cough: Secondary | ICD-10-CM | POA: Diagnosis not present

## 2020-04-16 DIAGNOSIS — R059 Cough, unspecified: Secondary | ICD-10-CM

## 2020-04-16 NOTE — Addendum Note (Signed)
Addended by: Satira Sark D on: 04/16/2020 11:32 AM   Modules accepted: Orders

## 2020-04-16 NOTE — Patient Instructions (Signed)
I suspect your cough is from sinus drainage Continue treatment as prescribed by primary care We will schedule pulmonary function test for better evaluation of your lungs Follow-up in 1 to 2 months.

## 2020-04-16 NOTE — Progress Notes (Signed)
Tracy Mckenzie    [id number]    1955-08-15  Primary Care Physician:Wolfe, Ebony Hail, MD  Referring Physician: Orma Flaming, MD [address] [place],  [place] [address]  Chief complaint: Consult for chronic cough  HPI: 64 year old with history of hypertension, GERD, osteoporosis, arthritis Complains of chronic cough for close to a year, associated with allergic rhinitis, postnasal drip She has been treating her self with cough drops.  Given Astelin, Singulair, albuterol and Xyzal at primary care with some improvement She had a chest x-ray earlier this year which shows normal  She has history of GERD, EGD in 2018 showed hiatal hernia and nonbleeding gastric ulcers secondary to NSAID use.  She is on PPI Recently evaluated by hematology for leukocytosis.  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  No mold, hot tub, Jacuzzi Smoking history: Never smoker Travel history: Originally from [place].  No significant travel history Relevant family history: Mother and father had COPD.  Mother was on oxygen.  They are non-smokers but were exposed to secondhand smoke  Outpatient Encounter Medications as of 04/16/2020  Medication Sig  . albuterol (VENTOLIN HFA) 108 (90 Base) MCG/ACT inhaler Inhale 2 puffs into the lungs every 6 (six) hours as needed for wheezing or shortness of breath.  Marland Kitchen azelastine (ASTELIN) 0.1 % nasal spray Place 1 spray into both nostrils 2 (two) times daily. Use in each nostril as directed  . COCONUT OIL PO Take 1,000 mg by mouth daily.   . Coenzyme Q10 (CO Q-10) 100 MG CAPS Take 1 tablet by mouth daily.  . Cyanocobalamin (VITAMIN B-12) 5000 MCG TBDP Take 1 tablet by mouth daily.   . cyclobenzaprine (FLEXERIL) 10 MG tablet TAKE ONE TABLET BY MOUTH THREE TIMES A DAY AS NEEDED FOR MUSCLE SPASMS  . hydrochlorothiazide (MICROZIDE) 12.5 MG capsule Take 1 capsule (12.5 mg total) by mouth  daily.  Marland Kitchen levocetirizine (XYZAL) 5 MG tablet Take 5 mg by mouth daily.  Marland Kitchen losartan (COZAAR) 50 MG tablet Take one tablet twice a day for blood pressure  . magnesium 30 MG tablet Take 3 tablets by mouth daily.  . montelukast (SINGULAIR) 10 MG tablet TAKE ONE TABLET BY MOUTH AT BEDTIME  . Multiple Vitamins-Minerals (PRESERVISION AREDS PO) Take by mouth.  . nitroGLYCERIN (NITROSTAT) 0.4 MG SL tablet Place under the tongue.  . Nutritional Supplements (JUICE PLUS FIBRE PO) Take by mouth.  Marland Kitchen omeprazole (PRILOSEC) 40 MG capsule TAKE ONE CAPSULE BY MOUTH EVERY DAY 30 MINUTES BEFORE BREAkfast  . Potassium 99 MG TABS Take 1 tablet by mouth daily.  Marland Kitchen Spacer/Aero-Holding Chambers (PRO COMFORT SPACER ADULT) MISC 1 Device by Does not apply route as needed.  . SUPER B COMPLEX/C PO Take by mouth.  . traMADol-acetaminophen (ULTRACET) 37.5-325 MG tablet Take 1 tablet by mouth every 8 (eight) hours as needed.   Facility-Administered Encounter Medications as of 04/16/2020  Medication  . 0.9 %  sodium chloride infusion    Allergies as of 04/16/2020 - Review Complete 04/16/2020  Allergen Reaction Noted  . Meperidine Palpitations and Anaphylaxis 09/09/2016  . Simvastatin  09/28/2015  . Oxycodone-acetaminophen Itching and Rash 09/16/2016  . Vicodin [hydrocodone-acetaminophen] Itching 09/16/2016    Past Medical History:  Diagnosis Date  . Arthritis   . Bleeding ulcer   . Elevated cholesterol   . Hiatal hernia   . High blood pressure   . Kidney cysts   .  Liver cyst   . Nerve pain    L sciatic pain radiating to R  . Peptic ulcer disease     Past Surgical History:  Procedure Laterality Date  . APPENDECTOMY  1992  . BREAST CYST ASPIRATION Left 2003  . KNEE ARTHROSCOPY Right 04/12/2001   torn cartilage  . LAPAROSCOPY  1981   for endometriosis  . LEG SURGERY Right 11/23/2000   to remove screws  . LEG SURGERY Right 02/08/2002   to remove rod  . MENISCUS REPAIR  01/29/2003  . TIBIA FRACTURE  SURGERY Right 06/12/2000   Multiple surgeries  . TONSILLECTOMY  1967  . TOTAL ABDOMINAL HYSTERECTOMY  1992    Family History  Problem Relation Age of Onset  . Kidney disease Mother   . COPD Mother   . Breast cancer Maternal Aunt   . Breast cancer Paternal Aunt   . COPD Father   . Diabetes Maternal Grandfather   . Stroke Paternal Grandfather   . Colon cancer Neg Hx     Social History   Socioeconomic History  . Marital status: Married    Spouse name: Not on file  . Number of children: Not on file  . Years of education: Not on file  . Highest education level: Not on file  Occupational History  . Not on file  Tobacco Use  . Smoking status: Never Smoker  . Smokeless tobacco: Never Used  Substance and Sexual Activity  . Alcohol use: No  . Drug use: No  . Sexual activity: Not on file  Other Topics Concern  . Not on file  Social History Narrative  . Not on file   Social Determinants of Health   Financial Resource Strain:   . Difficulty of Paying Living Expenses: Not on file  Food Insecurity:   . Worried About Charity fundraiser in the Last Year: Not on file  . Ran Out of Food in the Last Year: Not on file  Transportation Needs:   . Lack of Transportation (Medical): Not on file  . Lack of Transportation (Non-Medical): Not on file  Physical Activity:   . Days of Exercise per Week: Not on file  . Minutes of Exercise per Session: Not on file  Stress:   . Feeling of Stress : Not on file  Social Connections:   . Frequency of Communication with Friends and Family: Not on file  . Frequency of Social Gatherings with Friends and Family: Not on file  . Attends Religious Services: Not on file  . Active Member of Clubs or Organizations: Not on file  . Attends Archivist Meetings: Not on file  . Marital Status: Not on file  Intimate Partner Violence:   . Fear of Current or Ex-Partner: Not on file  . Emotionally Abused: Not on file  . Physically Abused: Not on  file  . Sexually Abused: Not on file    Review of systems: Review of Systems  Constitutional: Negative for fever and chills.  HENT: Negative.   Eyes: Negative for blurred vision.  Respiratory: as per HPI  Cardiovascular: Negative for chest pain and palpitations.  Gastrointestinal: Negative for vomiting, diarrhea, blood per rectum. Genitourinary: Negative for dysuria, urgency, frequency and hematuria.  Musculoskeletal: Negative for myalgias, back pain and joint pain.  Skin: Negative for itching and rash.  Neurological: Negative for dizziness, tremors, focal weakness, seizures and loss of consciousness.  Endo/Heme/Allergies: Negative for environmental allergies.  Psychiatric/Behavioral: Negative for depression, suicidal ideas and hallucinations.  All other systems reviewed and are negative.  Physical Exam: Blood pressure 122/78, pulse 82, temperature (!) 93.7 F (34.3 C), temperature source Oral, height 5\' 7"  (1.702 m), weight 177 lb 3.2 oz (80.4 kg), SpO2 97 %. Gen:      No acute distress HEENT:  EOMI, sclera anicteric Neck:     No masses; no thyromegaly Lungs:    Clear to auscultation bilaterally; normal respiratory effort CV:         Regular rate and rhythm; no murmurs Abd:      + bowel sounds; soft, non-tender; no palpable masses, no distension Ext:    No edema; adequate peripheral perfusion Skin:      Warm and dry; no rash Neuro: alert and oriented x 3 Psych: normal mood and affect  Data Reviewed: Imaging: Chest x-ray 01/31/2020-mild left hemidiaphragm elevation.  No active cardiopulmonary disease.  I reviewed the images personally.  PFTs:  Labs: CBC 04/14/2020-WBC 12.7, eos 2%, absolute eosinophil count 254  Assessment:  Chronic cough Likely upper airway cough from postnasal drip, GERD Interestingly she has a family history of COPD and the parents are non-smokers We will get PFTs for evaluation.  If abnormal then check alpha-1 antitrypsin levels and  phenotype  Continue nasal spray, antihistamine  Plan/Recommendations: PFTs   Marshell Garfinkel MD [facility] 04/16/2020, 11:07 AM  CC: Orma Flaming, MD

## 2020-04-22 LAB — JAK2 (INCLUDING V617F AND EXON 12), MPL,& CALR W/RFL MPN PANEL (NGS)

## 2020-04-22 LAB — BCR ABL1 FISH (GENPATH)

## 2020-04-23 ENCOUNTER — Telehealth: Payer: Self-pay | Admitting: *Deleted

## 2020-04-23 NOTE — Telephone Encounter (Signed)
-----   Message from Orson Slick, MD sent at 04/23/2020 10:03 AM EDT ----- Please let Tracy Mckenzie know that her inflammatory and genetic workup did not show a clear cause of her elevated WBC count. At this time it is not clear what is causing this finding. We will have her return in about 3 months to continue monitoring.  ----- Message ----- From: Buel Ream, Lab [facility] [place] Sent: 04/14/2020  10:52 AM EDT To: Orson Slick, MD

## 2020-04-23 NOTE — Telephone Encounter (Signed)
TCT patient regarding lab results from 04/14/20. Spoke with patient and informed her that her labs did not show a clear reason for her elevated white blood cell count. She is aware to come back in 3 months for a re-check of some labs and see Dr. Lorenso Courier. She voiced understanding.

## 2020-05-01 ENCOUNTER — Other Ambulatory Visit: Payer: Self-pay | Admitting: Family Medicine

## 2020-05-06 ENCOUNTER — Other Ambulatory Visit: Payer: Self-pay | Admitting: Family Medicine

## 2020-05-06 ENCOUNTER — Encounter: Payer: Self-pay | Admitting: Family Medicine

## 2020-05-07 ENCOUNTER — Ambulatory Visit (INDEPENDENT_AMBULATORY_CARE_PROVIDER_SITE_OTHER): Payer: Medicare HMO | Admitting: Family Medicine

## 2020-05-07 ENCOUNTER — Encounter: Payer: Self-pay | Admitting: Family Medicine

## 2020-05-07 VITALS — BP 140/80 | HR 88 | Temp 98.0°F | Ht 67.0 in | Wt 173.8 lb

## 2020-05-07 DIAGNOSIS — L309 Dermatitis, unspecified: Secondary | ICD-10-CM

## 2020-05-07 DIAGNOSIS — R21 Rash and other nonspecific skin eruption: Secondary | ICD-10-CM

## 2020-05-07 MED ORDER — PREDNISONE 20 MG PO TABS: ORAL_TABLET | ORAL | 0 refills | Status: DC

## 2020-05-07 MED ORDER — CYCLOBENZAPRINE HCL 10 MG PO TABS
ORAL_TABLET | ORAL | 2 refills | Status: DC
Start: 2020-05-07 — End: 2020-08-28

## 2020-05-07 NOTE — Patient Instructions (Signed)
-  checking labs for lupus, in distribution of facial rash you can see with lupus.  -stop putting on topical benadryl. I want you to get topical hydrocortisone cream and can put on twice a day.  -steroid taper that you will do  Please keep track of this/products/etc. Also see if rash gets worse with heat, spicy/hot foods/alcohol.   Other thoughts are rosacea  Refilled your flexeril!   Good to see you!  Dr. Rogers Blocker

## 2020-05-07 NOTE — Progress Notes (Signed)
Patient: Tracy Mckenzie MRN: [id number] DOB: April 27, 1956 PCP: Orma Flaming, MD     Subjective:  Chief Complaint  Patient presents with  . Rash    HPI: The patient is a 64 y.o. female who presents today for facial rash. Starting 1 weeks ago. She says that she thinks that she may have gotten it from her first Prolia injection, but this was over 2 months ago. She says she used benadryl  Cream. It has not helped. She states taking benadryl tablet last night. Rash starts under temples and is spreading medially into her cheeks. Feels like it is starting to go down her neck. She states it can be itchy. She has not used any new facial products. No new foods, has not been around anything outside/plants. Her grandchildren had HFM a month ago. She also has some diarrhea this week. She is stressed.   Review of Systems  Constitutional: Negative for chills and fever.  HENT: Negative for facial swelling.   Respiratory: Negative for shortness of breath and wheezing.   Skin: Positive for rash.  Neurological: Negative for dizziness and light-headedness.    Allergies Patient is allergic to meperidine, simvastatin, oxycodone-acetaminophen, and vicodin [hydrocodone-acetaminophen].  Past Medical History Patient  has a past medical history of Arthritis, Bleeding ulcer, Elevated cholesterol, Hiatal hernia, High blood pressure, Kidney cysts, Liver cyst, Nerve pain, and Peptic ulcer disease.  Surgical History Patient  has a past surgical history that includes Appendectomy (1992); Tonsillectomy (1967); laparoscopy (1981); Total abdominal hysterectomy (1992); Tibia fracture surgery (Right, 06/12/2000); Leg Surgery (Right, 11/23/2000); Knee arthroscopy (Right, 04/12/2001); Leg Surgery (Right, 02/08/2002); Meniscus repair (01/29/2003); and Breast cyst aspiration (Left, 2003).  Family History Pateint's family history includes Breast cancer in her maternal aunt and paternal aunt; COPD in her father and mother;  Diabetes in her maternal grandfather; Kidney disease in her mother; Stroke in her paternal grandfather.  Social History Patient  reports that she has never smoked. She has never used smokeless tobacco. She reports that she does not drink alcohol and does not use drugs.    Objective: Vitals:   05/07/20 1305  BP: 140/80  Pulse: 88  Temp: 98 F (36.7 C)  TempSrc: Temporal  SpO2: 99%  Weight: 173 lb 12.8 oz (78.8 kg)  Height: 5\' 7"  (1.702 m)    Body mass index is 27.22 kg/m.  Physical Exam Vitals reviewed.  Constitutional:      Appearance: Normal appearance. She is normal weight.  HENT:     Head: Normocephalic and atraumatic.  Pulmonary:     Effort: Pulmonary effort is normal.  Skin:    Findings: Rash present.     Comments: Very faint maculopapular rash on side of face. Minimal erythema. Some telangiectasias   Neurological:     General: No focal deficit present.     Mental Status: She is alert and oriented to person, place, and time.        Assessment/plan: 1. Dermatitis Dermatitis vs. rosacea vs. Lupus. Checking ANA and starting her on topical otc hydrocortisone cream and oral steroid taper since on face. Side effects of steroids discussed. Let me know if not improving.  - ANA, IFA Comprehensive Panel; Future   This visit occurred during the SARS-CoV-2 public health emergency.  Safety protocols were in place, including screening questions prior to the visit, additional usage of staff PPE, and extensive cleaning of exam room while observing appropriate contact time as indicated for disinfecting solutions.     Return if symptoms worsen or fail  to improve.   Orma Flaming, MD [place]   05/07/2020

## 2020-05-12 LAB — ANA, IFA COMPREHENSIVE PANEL
Anti Nuclear Antibody (ANA): NEGATIVE
ENA SM Ab Ser-aCnc: <1 AI
SM/RNP: <1 AI
SSA (Ro) (ENA) Antibody, IgG: <1 AI
SSB (La) (ENA) Antibody, IgG: <1 AI
Scleroderma (Scl-70) (ENA) Antibody, IgG: <1 AI
ds DNA Ab: 1 IU/mL

## 2020-05-23 ENCOUNTER — Ambulatory Visit (INDEPENDENT_AMBULATORY_CARE_PROVIDER_SITE_OTHER): Payer: Medicare HMO | Admitting: Pulmonary Disease

## 2020-05-23 ENCOUNTER — Other Ambulatory Visit: Payer: Self-pay

## 2020-05-23 ENCOUNTER — Encounter: Payer: Self-pay | Admitting: Pulmonary Disease

## 2020-05-23 VITALS — BP 136/66 | HR 103 | Temp 97.8°F | Ht 66.75 in | Wt 174.6 lb

## 2020-05-23 DIAGNOSIS — R059 Cough, unspecified: Secondary | ICD-10-CM

## 2020-05-23 LAB — PULMONARY FUNCTION TEST
DL/VA % pred: 110 %
DL/VA: 4.51 ml/min/mmHg/L
DLCO cor % pred: 86 %
DLCO cor: 18.67 ml/min/mmHg
DLCO unc % pred: 85 %
DLCO unc: 18.44 ml/min/mmHg
FEF 25-75 Post: 1.74 L/sec
FEF 25-75 Pre: 1.74 L/sec
FEF2575-%Change-Post: 0 %
FEF2575-%Pred-Post: 77 %
FEF2575-%Pred-Pre: 77 %
FEV1-%Change-Post: 0 %
FEV1-%Pred-Post: 82 %
FEV1-%Pred-Pre: 82 %
FEV1-Post: 2.2 L
FEV1-Pre: 2.19 L
FEV1FVC-%Change-Post: 1 %
FEV1FVC-%Pred-Pre: 98 %
FEV6-%Change-Post: -1 %
FEV6-%Pred-Post: 85 %
FEV6-%Pred-Pre: 86 %
FEV6-Post: 2.85 L
FEV6-Pre: 2.9 L
FEV6FVC-%Change-Post: 0 %
FEV6FVC-%Pred-Post: 103 %
FEV6FVC-%Pred-Pre: 104 %
FVC-%Change-Post: -1 %
FVC-%Pred-Post: 82 %
FVC-%Pred-Pre: 83 %
FVC-Post: 2.85 L
FVC-Pre: 2.9 L
Post FEV1/FVC ratio: 77 %
Post FEV6/FVC ratio: 100 %
Pre FEV1/FVC ratio: 76 %
Pre FEV6/FVC Ratio: 100 %
RV % pred: 112 %
RV: 2.52 L
TLC % pred: 100 %
TLC: 5.47 L

## 2020-05-23 NOTE — Patient Instructions (Signed)
I have reviewed your pulmonary function test which are normal. There is no evidence of asthma or COPD Continue the treatment you are taking for cough I am glad your symptoms are getting better  Follow-up in 6 months.

## 2020-05-23 NOTE — Progress Notes (Signed)
Tracy Mckenzie    [id number]    1956/01/16  Primary Care Physician:Wolfe, Ebony Hail, MD  Referring Physician: Orma Flaming, MD [address] [place],  [place] [address]  Chief complaint: Consult for chronic cough  HPI: 64 year old with history of hypertension, GERD, osteoporosis, arthritis Complains of chronic cough for close to a year, associated with allergic rhinitis, postnasal drip She has been treating her self with cough drops.  Given Astelin, Singulair, albuterol and Xyzal at primary care with some improvement She had a chest x-ray earlier this year which shows normal  She has history of GERD, EGD in 2018 showed hiatal hernia and nonbleeding gastric ulcers secondary to NSAID use.  She is on PPI Recently evaluated by hematology for leukocytosis.  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  No mold, hot tub, Jacuzzi Smoking history: Never smoker Travel history: Originally from [place].  No significant travel history Relevant family history: Mother and father had COPD.  Mother was on oxygen.  They are non-smokers but were exposed to secondhand smoke  Outpatient Encounter Medications as of 05/23/2020  Medication Sig  . albuterol (VENTOLIN HFA) 108 (90 Base) MCG/ACT inhaler Inhale 2 puffs into the lungs every 6 (six) hours as needed for wheezing or shortness of breath.  Marland Kitchen azelastine (ASTELIN) 0.1 % nasal spray Place 1 spray into both nostrils 2 (two) times daily. Use in each nostril as directed  . COCONUT OIL PO Take 1,000 mg by mouth daily.   . Coenzyme Q10 (CO Q-10) 100 MG CAPS Take 1 tablet by mouth daily.  . Cyanocobalamin (VITAMIN B-12) 5000 MCG TBDP Take 1 tablet by mouth daily.   . cyclobenzaprine (FLEXERIL) 10 MG tablet TAKE ONE TABLET BY MOUTH THREE TIMES A DAY AS NEEDED FOR MUSCLE SPASMS  . hydrochlorothiazide (MICROZIDE) 12.5 MG capsule Take 1 capsule (12.5 mg total) by mouth  daily.  Marland Kitchen levocetirizine (XYZAL) 5 MG tablet Take 5 mg by mouth daily.  Marland Kitchen losartan (COZAAR) 50 MG tablet Take one tablet twice a day for blood pressure  . magnesium 30 MG tablet Take 3 tablets by mouth daily.  . montelukast (SINGULAIR) 10 MG tablet TAKE ONE TABLET BY MOUTH AT BEDTIME  . Multiple Vitamins-Minerals (PRESERVISION AREDS PO) Take by mouth.  . nitroGLYCERIN (NITROSTAT) 0.4 MG SL tablet Place under the tongue.  . Nutritional Supplements (JUICE PLUS FIBRE PO) Take by mouth.  Marland Kitchen omeprazole (PRILOSEC) 40 MG capsule TAKE ONE CAPSULE BY MOUTH EVERY DAY 30 MINUTES BEFORE BREAkfast  . Potassium 99 MG TABS Take 1 tablet by mouth daily.  Marland Kitchen Spacer/Aero-Holding Chambers (PRO COMFORT SPACER ADULT) MISC 1 Device by Does not apply route as needed.  . SUPER B COMPLEX/C PO Take by mouth.  . traMADol-acetaminophen (ULTRACET) 37.5-325 MG tablet Take 1 tablet by mouth every 8 (eight) hours as needed.  . [DISCONTINUED] predniSONE (DELTASONE) 20 MG tablet 3 pills daily (60mg ) x 3 days then 2 pills daily x 3 days (40mg ) then one pill x 3 days then 1/2 pill x4 days.   Facility-Administered Encounter Medications as of 05/23/2020  Medication  . 0.9 %  sodium chloride infusion    Allergies as of 05/23/2020 - Review Complete 05/23/2020  Allergen Reaction Noted  . Meperidine Palpitations and Anaphylaxis 09/09/2016  . Simvastatin  09/28/2015  . Oxycodone-acetaminophen Itching and Rash 09/16/2016  . Vicodin [hydrocodone-acetaminophen] Itching 09/16/2016    Past Medical  History:  Diagnosis Date  . Arthritis   . Bleeding ulcer   . Elevated cholesterol   . Hiatal hernia   . High blood pressure   . Kidney cysts   . Liver cyst   . Nerve pain    L sciatic pain radiating to R  . Peptic ulcer disease     Past Surgical History:  Procedure Laterality Date  . APPENDECTOMY  1992  . BREAST CYST ASPIRATION Left 2003  . KNEE ARTHROSCOPY Right 04/12/2001   torn cartilage  . LAPAROSCOPY  1981   for  endometriosis  . LEG SURGERY Right 11/23/2000   to remove screws  . LEG SURGERY Right 02/08/2002   to remove rod  . MENISCUS REPAIR  01/29/2003  . TIBIA FRACTURE SURGERY Right 06/12/2000   Multiple surgeries  . TONSILLECTOMY  1967  . TOTAL ABDOMINAL HYSTERECTOMY  1992    Family History  Problem Relation Age of Onset  . Kidney disease Mother   . COPD Mother   . Breast cancer Maternal Aunt   . Breast cancer Paternal Aunt   . COPD Father   . Diabetes Maternal Grandfather   . Stroke Paternal Grandfather   . Colon cancer Neg Hx     Social History   Socioeconomic History  . Marital status: Married    Spouse name: Not on file  . Number of children: Not on file  . Years of education: Not on file  . Highest education level: Not on file  Occupational History  . Not on file  Tobacco Use  . Smoking status: Never Smoker  . Smokeless tobacco: Never Used  Substance and Sexual Activity  . Alcohol use: No  . Drug use: No  . Sexual activity: Not on file  Other Topics Concern  . Not on file  Social History Narrative  . Not on file   Social Determinants of Health   Financial Resource Strain:   . Difficulty of Paying Living Expenses: Not on file  Food Insecurity:   . Worried About Charity fundraiser in the Last Year: Not on file  . Ran Out of Food in the Last Year: Not on file  Transportation Needs:   . Lack of Transportation (Medical): Not on file  . Lack of Transportation (Non-Medical): Not on file  Physical Activity:   . Days of Exercise per Week: Not on file  . Minutes of Exercise per Session: Not on file  Stress:   . Feeling of Stress : Not on file  Social Connections:   . Frequency of Communication with Friends and Family: Not on file  . Frequency of Social Gatherings with Friends and Family: Not on file  . Attends Religious Services: Not on file  . Active Member of Clubs or Organizations: Not on file  . Attends Archivist Meetings: Not on file  .  Marital Status: Not on file  Intimate Partner Violence:   . Fear of Current or Ex-Partner: Not on file  . Emotionally Abused: Not on file  . Physically Abused: Not on file  . Sexually Abused: Not on file    Review of systems: Review of Systems  Constitutional: Negative for fever and chills.  HENT: Negative.   Eyes: Negative for blurred vision.  Respiratory: as per HPI  Cardiovascular: Negative for chest pain and palpitations.  Gastrointestinal: Negative for vomiting, diarrhea, blood per rectum. Genitourinary: Negative for dysuria, urgency, frequency and hematuria.  Musculoskeletal: Negative for myalgias, back pain and joint pain.  Skin: Negative for itching and rash.  Neurological: Negative for dizziness, tremors, focal weakness, seizures and loss of consciousness.  Endo/Heme/Allergies: Negative for environmental allergies.  Psychiatric/Behavioral: Negative for depression, suicidal ideas and hallucinations.  All other systems reviewed and are negative.  Physical Exam: Blood pressure 122/78, pulse 82, temperature (!) 93.7 F (34.3 C), temperature source Oral, height 5\' 7"  (1.702 m), weight 177 lb 3.2 oz (80.4 kg), SpO2 97 %. Gen:      No acute distress HEENT:  EOMI, sclera anicteric Neck:     No masses; no thyromegaly Lungs:    Clear to auscultation bilaterally; normal respiratory effort CV:         Regular rate and rhythm; no murmurs Abd:      + bowel sounds; soft, non-tender; no palpable masses, no distension Ext:    No edema; adequate peripheral perfusion Skin:      Warm and dry; no rash Neuro: alert and oriented x 3 Psych: normal mood and affect  Data Reviewed: Imaging: Chest x-ray 01/31/2020-mild left hemidiaphragm elevation.  No active cardiopulmonary disease.  I reviewed the images personally.  PFTs: 05/23/2020 FVC 2.85 [82%], FEV1 2.20 [82%], F/F 77, TLC 5.47 [100%], DLCO 18.44 [85%] Normal test  Labs: CBC 04/14/2020-WBC 12.7, eos 2%, absolute eosinophil count  254  Assessment:  Chronic cough Likely upper airway cough from postnasal drip, GERD Interestingly she has a family history of COPD and the parents are non-smokers But PFTs reviewed with no evidence of obstruction.  PFT results discussed in detail with patient today  Continue nasal spray, antihistamine  Plan/Recommendations: Steroid nasal spray, PPI, antihistamine  Marshell Garfinkel MD [facility] 05/23/2020, 10:33 AM  CC: Orma Flaming, MD

## 2020-05-23 NOTE — Progress Notes (Signed)
PFT done today. 

## 2020-05-29 MED ORDER — HYDROXYZINE HCL 25 MG PO TABS: 25.0000 mg | ORAL_TABLET | Freq: Three times a day (TID) | ORAL | 0 refills | Status: DC | PRN

## 2020-05-31 ENCOUNTER — Other Ambulatory Visit: Payer: Self-pay | Admitting: Family Medicine

## 2020-06-09 ENCOUNTER — Other Ambulatory Visit: Payer: Self-pay | Admitting: Family Medicine

## 2020-06-12 DIAGNOSIS — L259 Unspecified contact dermatitis, unspecified cause: Secondary | ICD-10-CM | POA: Diagnosis not present

## 2020-06-12 DIAGNOSIS — L710 Perioral dermatitis: Secondary | ICD-10-CM | POA: Diagnosis not present

## 2020-06-30 DIAGNOSIS — R69 Illness, unspecified: Secondary | ICD-10-CM | POA: Diagnosis not present

## 2020-07-09 ENCOUNTER — Other Ambulatory Visit: Payer: Self-pay | Admitting: Family Medicine

## 2020-07-10 NOTE — Telephone Encounter (Signed)
Please see message attached to medication refill.

## 2020-07-11 ENCOUNTER — Encounter: Payer: Self-pay | Admitting: Family Medicine

## 2020-07-11 MED ORDER — HYDROXYZINE HCL 25 MG PO TABS: 25.0000 mg | ORAL_TABLET | Freq: Three times a day (TID) | ORAL | 0 refills | Status: DC | PRN

## 2020-07-14 ENCOUNTER — Inpatient Hospital Stay: Payer: Medicare HMO

## 2020-07-14 ENCOUNTER — Inpatient Hospital Stay: Payer: Medicare HMO | Admitting: Hematology and Oncology

## 2020-07-15 ENCOUNTER — Other Ambulatory Visit: Payer: Self-pay | Admitting: Family Medicine

## 2020-08-03 ENCOUNTER — Other Ambulatory Visit: Payer: Self-pay | Admitting: Family Medicine

## 2020-08-04 DIAGNOSIS — L718 Other rosacea: Secondary | ICD-10-CM | POA: Diagnosis not present

## 2020-08-04 DIAGNOSIS — L239 Allergic contact dermatitis, unspecified cause: Secondary | ICD-10-CM | POA: Diagnosis not present

## 2020-08-06 ENCOUNTER — Inpatient Hospital Stay (HOSPITAL_BASED_OUTPATIENT_CLINIC_OR_DEPARTMENT_OTHER): Payer: Medicare HMO | Admitting: Hematology and Oncology

## 2020-08-06 ENCOUNTER — Other Ambulatory Visit: Payer: Self-pay | Admitting: Hematology and Oncology

## 2020-08-06 ENCOUNTER — Encounter: Payer: Self-pay | Admitting: Hematology and Oncology

## 2020-08-06 ENCOUNTER — Inpatient Hospital Stay: Payer: Medicare HMO | Attending: Hematology and Oncology

## 2020-08-06 ENCOUNTER — Other Ambulatory Visit: Payer: Self-pay

## 2020-08-06 VITALS — BP 142/69 | HR 98 | Temp 97.8°F | Resp 15 | Ht 66.75 in | Wt 174.5 lb

## 2020-08-06 DIAGNOSIS — D72825 Bandemia: Secondary | ICD-10-CM | POA: Diagnosis not present

## 2020-08-06 DIAGNOSIS — D72829 Elevated white blood cell count, unspecified: Secondary | ICD-10-CM | POA: Diagnosis not present

## 2020-08-06 LAB — CMP (CANCER CENTER ONLY)
ALT: 17 U/L (ref 0–44)
AST: 13 U/L — ABNORMAL LOW (ref 15–41)
Albumin: 3.8 g/dL (ref 3.5–5.0)
Alkaline Phosphatase: 113 U/L (ref 38–126)
Anion gap: 8 (ref 5–15)
BUN: 16 mg/dL (ref 8–23)
CO2: 28 mmol/L (ref 22–32)
Calcium: 9.7 mg/dL (ref 8.9–10.3)
Chloride: 105 mmol/L (ref 98–111)
Creatinine: 0.95 mg/dL (ref 0.44–1.00)
GFR, Estimated: >60 mL/min (ref >60)
Glucose, Bld: 82 mg/dL (ref 70–99)
Potassium: 3.7 mmol/L (ref 3.5–5.1)
Sodium: 141 mmol/L (ref 135–145)
Total Bilirubin: 0.5 mg/dL (ref 0.3–1.2)
Total Protein: 7.2 g/dL (ref 6.5–8.1)

## 2020-08-06 LAB — CBC WITH DIFFERENTIAL (CANCER CENTER ONLY)
Abs Immature Granulocytes: 0.04 10*3/uL (ref 0.00–0.07)
Basophils Absolute: 0.1 10*3/uL (ref 0.0–0.1)
Basophils Relative: 0 %
Eosinophils Absolute: 0.4 10*3/uL (ref 0.0–0.5)
Eosinophils Relative: 3 %
HCT: 39.6 % (ref 36.0–46.0)
Hemoglobin: 12.8 g/dL (ref 12.0–15.0)
Immature Granulocytes: 0 %
Lymphocytes Relative: 19 %
Lymphs Abs: 2.4 10*3/uL (ref 0.7–4.0)
MCH: 28.7 pg (ref 26.0–34.0)
MCHC: 32.3 g/dL (ref 30.0–36.0)
MCV: 88.8 fL (ref 80.0–100.0)
Monocytes Absolute: 0.8 10*3/uL (ref 0.1–1.0)
Monocytes Relative: 7 %
Neutro Abs: 8.9 10*3/uL — ABNORMAL HIGH (ref 1.7–7.7)
Neutrophils Relative %: 71 %
Platelet Count: 356 10*3/uL (ref 150–400)
RBC: 4.46 MIL/uL (ref 3.87–5.11)
RDW: 14.5 % (ref 11.5–15.5)
WBC Count: 12.6 10*3/uL — ABNORMAL HIGH (ref 4.0–10.5)
nRBC: 0 % (ref 0.0–0.2)

## 2020-08-06 NOTE — Progress Notes (Signed)
[facility] [phone]   [phone]  PROGRESS NOTE  Patient Care Team: Orma Flaming, MD as PCP - General (Family Medicine)  Hematological/Oncological History # Leukocytosis/ Neutrophilia 08/13/2019: WBC 14.2, Hgb 13.0, MCV 87.8, Plt 406 03/07/2020: WBC 12.2, Hgb 12.2, MCV 86.5, Plt 355 03/18/2020: WBC 11.1, Hgb 12.8, MCV 87.3, Plt 383 04/14/2020: establish care with Dr. Lorenso Courier. WBC 12.7 08/06/2020: WBC 12.6, Hgb 12.8, MCV 88.8, Plt 356   Interval History:  Tracy Mckenzie 65 y.o. female with medical history significant for leukocytosis presents for a follow up visit. The patient's last visit was on 04/14/2020. In the interim since the last visit the patient has developed a blistering rash on her face which is requiring treatment with steroids and antibiotic cream.  On exam today Tracy Mckenzie notes that she feels well.  She denies having issues with fevers, chills, sweats, nausea, vomiting or diarrhea.  She does that the rash is not currently inflamed and she is wearing make-up.  She does note some redness in her cheeks, but she has been compliant with her steroid antibiotic cream which has helped prevent the flaring of this rash.  She notes that the rash is akin to "chickenpox".  She notes that she is taking steroid cream, but only took steroid pills back in October and is not currently taking them at this time.    She also notes that she has had a cough which has been persistent since January 2021.  She reports that COPD was present both her parents despite the being non-smokers and that she was previously checked for alpha-1 trypsin deficiency.  She also notes continued issues with her sinuses and persistent flareups of her sinusitis.  Otherwise she has been well with no other changes in her medications.  A full 10 point ROS is listed below.  MEDICAL HISTORY:  Past Medical History:  Diagnosis Date  . Arthritis   . Bleeding ulcer   . Elevated cholesterol    . Hiatal hernia   . High blood pressure   . Kidney cysts   . Liver cyst   . Nerve pain    L sciatic pain radiating to R  . Peptic ulcer disease     SURGICAL HISTORY: Past Surgical History:  Procedure Laterality Date  . APPENDECTOMY  1992  . BREAST CYST ASPIRATION Left 2003  . KNEE ARTHROSCOPY Right 04/12/2001   torn cartilage  . LAPAROSCOPY  1981   for endometriosis  . LEG SURGERY Right 11/23/2000   to remove screws  . LEG SURGERY Right 02/08/2002   to remove rod  . MENISCUS REPAIR  01/29/2003  . TIBIA FRACTURE SURGERY Right 06/12/2000   Multiple surgeries  . TONSILLECTOMY  1967  . TOTAL ABDOMINAL HYSTERECTOMY  1992    SOCIAL HISTORY: Social History   Socioeconomic History  . Marital status: Married    Spouse name: Not on file  . Number of children: Not on file  . Years of education: Not on file  . Highest education level: Not on file  Occupational History  . Not on file  Tobacco Use  . Smoking status: Never Smoker  . Smokeless tobacco: Never Used  Substance and Sexual Activity  . Alcohol use: No  . Drug use: No  . Sexual activity: Not on file  Other Topics Concern  . Not on file  Social History Narrative  . Not on file   Social Determinants of Health   Financial Resource Strain: Not on file  Food  Insecurity: Not on file  Transportation Needs: Not on file  Physical Activity: Not on file  Stress: Not on file  Social Connections: Not on file  Intimate Partner Violence: Not on file    FAMILY HISTORY: Family History  Problem Relation Age of Onset  . Kidney disease Mother   . COPD Mother   . Breast cancer Maternal Aunt   . Breast cancer Paternal Aunt   . COPD Father   . Diabetes Maternal Grandfather   . Stroke Paternal Grandfather   . Colon cancer Neg Hx     ALLERGIES:  is allergic to meperidine, simvastatin, oxycodone-acetaminophen, and vicodin [hydrocodone-acetaminophen].  MEDICATIONS:  Current Outpatient Medications  Medication Sig  Dispense Refill  . albuterol (VENTOLIN HFA) 108 (90 Base) MCG/ACT inhaler Inhale 2 puffs into the lungs every 6 (six) hours as needed for wheezing or shortness of breath. 18 g 1  . azelastine (ASTELIN) 0.1 % nasal spray Place 1 spray into both nostrils 2 (two) times daily. Use in each nostril as directed 30 mL 12  . COCONUT OIL PO Take 1,000 mg by mouth daily.     . Coenzyme Q10 (CO Q-10) 100 MG CAPS Take 1 tablet by mouth daily.    . Cyanocobalamin (VITAMIN B-12) 5000 MCG TBDP Take 1 tablet by mouth daily.     . cyclobenzaprine (FLEXERIL) 10 MG tablet TAKE ONE TABLET BY MOUTH THREE TIMES A DAY AS NEEDED FOR MUSCLE SPASMS 45 tablet 2  . hydrochlorothiazide (MICROZIDE) 12.5 MG capsule TAKE ONE CAPSULE BY MOUTH DAILY 90 capsule 3  . hydrOXYzine (ATARAX/VISTARIL) 25 MG tablet Take 1 tablet (25 mg total) by mouth 3 (three) times daily as needed for itching. 30 tablet 0  . levocetirizine (XYZAL) 5 MG tablet Take 5 mg by mouth daily.    Marland Kitchen losartan (COZAAR) 50 MG tablet TAKE ONE TABLET BY MOUTH TWICE A DAY FOR BLOOD PRESSURE 180 tablet 1  . magnesium 30 MG tablet Take 3 tablets by mouth daily.    . montelukast (SINGULAIR) 10 MG tablet TAKE ONE TABLET BY MOUTH AT BEDTIME 90 tablet 0  . Multiple Vitamins-Minerals (PRESERVISION AREDS PO) Take by mouth.    . nitroGLYCERIN (NITROSTAT) 0.4 MG SL tablet Place under the tongue.    . Nutritional Supplements (JUICE PLUS FIBRE PO) Take by mouth.    Marland Kitchen omeprazole (PRILOSEC) 40 MG capsule TAKE ONE CAPSULE BY MOUTH DAILY 30 MINUTES BEFORE BREAKFAST 90 capsule 3  . Potassium 99 MG TABS Take 1 tablet by mouth daily.    Marland Kitchen Spacer/Aero-Holding Chambers (PRO COMFORT SPACER ADULT) MISC 1 Device by Does not apply route as needed. 1 each 1  . SUPER B COMPLEX/C PO Take by mouth.    . traMADol-acetaminophen (ULTRACET) 37.5-325 MG tablet Take 1 tablet by mouth every 8 (eight) hours as needed. 30 tablet 0   Current Facility-Administered Medications  Medication Dose Route  Frequency Provider Last Rate Last Admin  . 0.9 %  sodium chloride infusion  500 mL Intravenous Continuous Danis, Starr Lake III, MD        REVIEW OF SYSTEMS:   Constitutional: ( - ) fevers, ( - )  chills , ( - ) night sweats Eyes: ( - ) blurriness of vision, ( - ) double vision, ( - ) watery eyes Ears, nose, mouth, throat, and face: ( - ) mucositis, ( - ) sore throat Respiratory: ( - ) cough, ( - ) dyspnea, ( - ) wheezes Cardiovascular: ( - ) palpitation, ( - )  chest discomfort, ( - ) lower extremity swelling Gastrointestinal:  ( - ) nausea, ( - ) heartburn, ( - ) change in bowel habits Skin: ( - ) abnormal skin rashes Lymphatics: ( - ) new lymphadenopathy, ( - ) easy bruising Neurological: ( - ) numbness, ( - ) tingling, ( - ) new weaknesses Behavioral/Psych: ( - ) mood change, ( - ) new changes  All other systems were reviewed with the patient and are negative.  PHYSICAL EXAMINATION:  Vitals:   08/06/20 0817  BP: (!) 142/69  Pulse: 98  Resp: 15  Temp: 97.8 F (36.6 C)  SpO2: 99%   Filed Weights   08/06/20 0817  Weight: 174 lb 8 oz (79.2 kg)    GENERAL: well appearing elderly Caucasian female. alert, no distress and comfortable SKIN: skin color, texture, turgor are normal, no rashes or significant lesions. Skin on face not currently broken out EYES: conjunctiva are pink and non-injected, sclera clear NECK: supple, non-tender LYMPH:  no palpable lymphadenopathy in the cervical, axillary or inguinal LUNGS: clear to auscultation and percussion with normal breathing effort HEART: regular rate & rhythm and no murmurs and no lower extremity edema Musculoskeletal: no cyanosis of digits and no clubbing  PSYCH: alert & oriented x 3, fluent speech NEURO: no focal motor/sensory deficits  LABORATORY DATA:  I have reviewed the data as listed CBC Latest Ref Rng & Units 08/06/2020 04/14/2020 03/18/2020  WBC 4.0 - 10.5 K/uL 12.6(H) 12.7(H) 11.1(H)  Hemoglobin 12.0 - 15.0 g/dL 12.8 13.0 12.8   Hematocrit 36.0 - 46.0 % 39.6 39.9 37.9  Platelets 150 - 400 K/uL 356 375 383    CMP Latest Ref Rng & Units 08/06/2020 04/14/2020 03/18/2020  Glucose 70 - 99 mg/dL 82 100(H) 103(H)  BUN 8 - 23 mg/dL 16 12 15   Creatinine 0.44 - 1.00 mg/dL 0.95 0.91 0.90  Sodium 135 - 145 mmol/L 141 142 140  Potassium 3.5 - 5.1 mmol/L 3.7 4.3 4.4  Chloride 98 - 111 mmol/L 105 106 104  CO2 22 - 32 mmol/L 28 30 29   Calcium 8.9 - 10.3 mg/dL 9.7 9.5 9.5  Total Protein 6.5 - 8.1 g/dL 7.2 7.4 6.4  Total Bilirubin 0.3 - 1.2 mg/dL 0.5 0.4 0.3  Alkaline Phos 38 - 126 U/L 113 149(H) -  AST 15 - 41 U/L 13(L) 19 22  ALT 0 - 44 U/L 17 23 26     RADIOGRAPHIC STUDIES: No results found.  ASSESSMENT & PLAN Tracy Mckenzie 64 y.o. female with medical history significant for HTN, GERD, osteoporosis, and osteoarthritis who presents for a follow up visit for longstanding leukocytosis.  After review the labs, review the records, discussion with the patient the findings are most consistent with an elevated neutrophil count of unclear etiology.    There are typically 3 primary causes for neutrophilia including infection, inflammation, and bone marrow dysfunction.  Infection is certainly a possibility in this patient with chronic sinus issues and may be a driving cause.  Inflammation may also be an etiology given her back and knee pain in order to assess this we ordered inflammatory markers, which were negative.  Bone marrow disorder is certainly a consideration, however we effectively ruled this out with JAK2 with reflex as well as BCR abl FISH.  The patient is not on any medications known to cause leukocytosis.    On exam today Tracy Mckenzie is well overall.  She is currently dealing with a rash that breaks out on her face and she  is being managed with metronidazole and steroid cream.  Otherwise she has had no major changes in her health since our last visit.  Today we discussed the results of her prior blood work and the results of  her CBC today.  Given the relative stability of her white blood cell count over the last 2 years I do believe would be reasonable to continue following serial CBCs with her PCP.  There is currently no evidence of a hematological malignancy or MPN.  #Leukocytosis/Neutrophilic Predominance --findings are most consistent with a low grade neutrophilia, likely due to inflammation. Unlikely to represent infection (though she has had chronic sinus issues).  --will reorder CBC, CMP today. Inflammatory markers were negative.  -review a peripheral blood film showed reactive appearing neutrophils but no other abnormalities.  --MPN ruled out with no abnormalities noted on JAK2 w/ reflex and BCR ABL --no signs of hematological malignancy, no need for a BmBx at this time.  --patient is up to date on her cancer screenings (last mammogram in April 2021, last colonoscopy 2014)  --recommend routine CBCs with PCP.  --RTC PRN for increasing WBC or other hematological abnormalities on CBC.   No orders of the defined types were placed in this encounter.   All questions were answered. The patient knows to call the clinic with any problems, questions or concerns.  A total of more than 30 minutes were spent on this encounter and over half of that time was spent on counseling and coordination of care as outlined above.   Ledell Peoples, MD Department of Hematology/Oncology [facility] Phone: [phone] Pager: [phone] Email: [email or web address]  08/06/2020 9:04 AM

## 2020-08-07 ENCOUNTER — Telehealth: Payer: Self-pay | Admitting: Hematology and Oncology

## 2020-08-07 NOTE — Telephone Encounter (Signed)
Per 1/5 los, no changes made to pt schedule

## 2020-08-28 ENCOUNTER — Other Ambulatory Visit: Payer: Self-pay | Admitting: Family Medicine

## 2020-09-08 ENCOUNTER — Ambulatory Visit: Payer: Medicare HMO | Admitting: Family Medicine

## 2020-09-09 ENCOUNTER — Encounter: Payer: Self-pay | Admitting: Orthopaedic Surgery

## 2020-09-09 ENCOUNTER — Ambulatory Visit: Payer: Medicare HMO | Admitting: Orthopaedic Surgery

## 2020-09-09 DIAGNOSIS — M25561 Pain in right knee: Secondary | ICD-10-CM

## 2020-09-09 DIAGNOSIS — G8929 Other chronic pain: Secondary | ICD-10-CM

## 2020-09-09 DIAGNOSIS — M1711 Unilateral primary osteoarthritis, right knee: Secondary | ICD-10-CM

## 2020-09-09 MED ORDER — LIDOCAINE HCL 1 % IJ SOLN
3.0000 mL | INTRAMUSCULAR | Status: AC | PRN
  Administered 2020-09-09: 3 mL

## 2020-09-09 MED ORDER — METHYLPREDNISOLONE ACETATE 40 MG/ML IJ SUSP
40.0000 mg | INTRAMUSCULAR | Status: AC | PRN
  Administered 2020-09-09: 40 mg via INTRA_ARTICULAR

## 2020-09-09 NOTE — Progress Notes (Signed)
Office Visit Note   Patient: Tracy Mckenzie           Date of Birth: Jun 03, 1956           MRN: [id number] Visit Date: 09/09/2020              Requested by: Orma Flaming, [address],  [place] [id number] PCP: Orma Flaming, MD   Assessment & Plan: Visit Diagnoses:  1. Unilateral primary osteoarthritis, right knee   2. Chronic pain of right knee     Plan: Per the patient's wish I did provide a steroid injection in her right knee which she tolerated well.  We counseled her about the risk and benefits of injections.  We can do this again in 3 to 4 months if she would like.  We also talked in length in detail about knee replacement surgery.  If he gets to the point where conservative treatment fails that would be our next step.  All questions and concerns were answered and addressed.  Follow-Up Instructions: Return if symptoms worsen or fail to improve.   Orders:  No orders of the defined types were placed in this encounter.  No orders of the defined types were placed in this encounter.     Procedures: Large Joint Inj: R knee on 09/09/2020 9:07 AM Indications: diagnostic evaluation and pain Details: 22 G 1.5 in needle, superolateral approach  Arthrogram: No  Medications: 3 mL lidocaine 1 %; 40 mg methylPREDNISolone acetate 40 MG/ML Outcome: tolerated well, no immediate complications Procedure, treatment alternatives, risks and benefits explained, specific risks discussed. Consent was given by the patient. Immediately prior to procedure a time out was called to verify the correct patient, procedure, equipment, support staff and site/side marked as required. Patient was prepped and draped in the usual sterile fashion.       Clinical Data: No additional findings.   Subjective: Chief Complaint  Patient presents with  . Right Knee - Pain  The patient is a 65 year old female well-known to me.  She has significant arthritis involving her left knee.  We last  provided a steroid injection in that knee almost a year ago and follow this up with a hyaluronic acid injection.  She has had previous trauma to that knee and has significant arthritis of the lateral compartment the knee with valgus malalignment.  She has had no acute change in medical status.  She does work from home.  She is getting close to considering knee replacement surgery.  She does report daily right knee pain and it is definitely affecting her mobility, her quality of life and her actives daily living.  HPI  Review of Systems She currently denies a headache, chest pain, shortness of breath, fever, chills, nausea, vomiting  Objective: Vital Signs: There were no vitals taken for this visit.  Physical Exam She is alert and oriented x3 and in no acute distress Ortho Exam Examination of her left knee shows slight valgus malalignment.  She lacks full extension by just a few degrees.  Flexion extension does cause pain in that knee.  There is significant lateral joint line tenderness. Specialty Comments:  No specialty comments available.  Imaging: No results found. Previous x-rays from last year show right knee tricompartmental arthritis mainly involving the lateral compartment and the patellofemoral joint with slight valgus malalignment.  PMFS History: Patient Active Problem List   Diagnosis Date Noted  . Unilateral primary osteoarthritis, right knee 09/05/2019  . Hyperlipidemia 08/15/2019  . Essential  hypertension 08/13/2019  . GERD (gastroesophageal reflux disease) 08/13/2019  . Osteoporosis 08/13/2019  . Post-traumatic osteoarthritis of right knee 08/13/2019  . Hiatal hernia 08/13/2019  . Chronic left-sided low back pain without sciatica 08/13/2019   Past Medical History:  Diagnosis Date  . Arthritis   . Bleeding ulcer   . Elevated cholesterol   . Hiatal hernia   . High blood pressure   . Kidney cysts   . Liver cyst   . Nerve pain    L sciatic pain radiating to R  .  Peptic ulcer disease     Family History  Problem Relation Age of Onset  . Kidney disease Mother   . COPD Mother   . Breast cancer Maternal Aunt   . Breast cancer Paternal Aunt   . COPD Father   . Diabetes Maternal Grandfather   . Stroke Paternal Grandfather   . Colon cancer Neg Hx     Past Surgical History:  Procedure Laterality Date  . APPENDECTOMY  1992  . BREAST CYST ASPIRATION Left 2003  . KNEE ARTHROSCOPY Right 04/12/2001   torn cartilage  . LAPAROSCOPY  1981   for endometriosis  . LEG SURGERY Right 11/23/2000   to remove screws  . LEG SURGERY Right 02/08/2002   to remove rod  . MENISCUS REPAIR  01/29/2003  . TIBIA FRACTURE SURGERY Right 06/12/2000   Multiple surgeries  . TONSILLECTOMY  1967  . TOTAL ABDOMINAL HYSTERECTOMY  1992   Social History   Occupational History  . Not on file  Tobacco Use  . Smoking status: Never Smoker  . Smokeless tobacco: Never Used  Substance and Sexual Activity  . Alcohol use: No  . Drug use: No  . Sexual activity: Not on file

## 2020-09-11 ENCOUNTER — Telehealth: Payer: Self-pay | Admitting: Family Medicine

## 2020-09-11 ENCOUNTER — Ambulatory Visit (INDEPENDENT_AMBULATORY_CARE_PROVIDER_SITE_OTHER): Payer: Medicare HMO | Admitting: Family Medicine

## 2020-09-11 ENCOUNTER — Other Ambulatory Visit: Payer: Self-pay

## 2020-09-11 ENCOUNTER — Encounter: Payer: Self-pay | Admitting: Family Medicine

## 2020-09-11 VITALS — BP 150/80 | HR 75 | Temp 97.7°F | Ht 66.75 in | Wt 178.0 lb

## 2020-09-11 DIAGNOSIS — M810 Age-related osteoporosis without current pathological fracture: Secondary | ICD-10-CM | POA: Diagnosis not present

## 2020-09-11 DIAGNOSIS — I10 Essential (primary) hypertension: Secondary | ICD-10-CM | POA: Diagnosis not present

## 2020-09-11 DIAGNOSIS — E782 Mixed hyperlipidemia: Secondary | ICD-10-CM

## 2020-09-11 LAB — CBC WITH DIFFERENTIAL/PLATELET
Basophils Absolute: 0 10*3/uL (ref 0.0–0.1)
Basophils Relative: 0.2 % (ref 0.0–3.0)
Eosinophils Absolute: 0.1 10*3/uL (ref 0.0–0.7)
Eosinophils Relative: 0.3 % (ref 0.0–5.0)
HCT: 38 % (ref 36.0–46.0)
Hemoglobin: 12.6 g/dL (ref 12.0–15.0)
Lymphocytes Relative: 16.3 % (ref 12.0–46.0)
Lymphs Abs: 3.3 10*3/uL (ref 0.7–4.0)
MCHC: 33.2 g/dL (ref 30.0–36.0)
MCV: 86.8 fl (ref 78.0–100.0)
Monocytes Absolute: 1.1 10*3/uL — ABNORMAL HIGH (ref 0.1–1.0)
Monocytes Relative: 5.5 % (ref 3.0–12.0)
Neutro Abs: 15.7 10*3/uL — ABNORMAL HIGH (ref 1.4–7.7)
Neutrophils Relative %: 77.7 % — ABNORMAL HIGH (ref 43.0–77.0)
Platelets: 400 10*3/uL (ref 150.0–400.0)
RBC: 4.37 Mil/uL (ref 3.87–5.11)
RDW: 14.4 % (ref 11.5–15.5)
WBC: 20.1 10*3/uL (ref 4.0–10.5)

## 2020-09-11 LAB — LIPID PANEL
Cholesterol: 235 mg/dL — ABNORMAL HIGH (ref 0–200)
HDL: 62.1 mg/dL (ref >39.00)
LDL Cholesterol: 159 mg/dL — ABNORMAL HIGH (ref 0–99)
NonHDL: 172.9
Total CHOL/HDL Ratio: 4
Triglycerides: 72 mg/dL (ref 0.0–149.0)
VLDL: 14.4 mg/dL (ref 0.0–40.0)

## 2020-09-11 LAB — COMPREHENSIVE METABOLIC PANEL
ALT: 22 U/L (ref 0–35)
AST: 19 U/L (ref 0–37)
Albumin: 4.5 g/dL (ref 3.5–5.2)
Alkaline Phosphatase: 116 U/L (ref 39–117)
BUN: 17 mg/dL (ref 6–23)
CO2: 32 mEq/L (ref 19–32)
Calcium: 9.9 mg/dL (ref 8.4–10.5)
Chloride: 102 mEq/L (ref 96–112)
Creatinine, Ser: 0.95 mg/dL (ref 0.40–1.20)
GFR: 62.36 mL/min (ref >60.00)
Glucose, Bld: 96 mg/dL (ref 70–99)
Potassium: 4.1 mEq/L (ref 3.5–5.1)
Sodium: 141 mEq/L (ref 135–145)
Total Bilirubin: 0.3 mg/dL (ref 0.2–1.2)
Total Protein: 7.4 g/dL (ref 6.0–8.3)

## 2020-09-11 LAB — MICROALBUMIN / CREATININE URINE RATIO
Creatinine,U: 78.3 mg/dL
Microalb Creat Ratio: 0.9 mg/g (ref 0.0–30.0)
Microalb, Ur: <0.7 mg/dL (ref 0.0–1.9)

## 2020-09-11 LAB — VITAMIN D 25 HYDROXY (VIT D DEFICIENCY, FRACTURES): VITD: >120 ng/mL

## 2020-09-11 NOTE — Patient Instructions (Addendum)
-  try to schedule colonoscopy when you have time! Let me know if I need to do referral, but if you already a patient.    Prophylactic covid vitamins  1) vitamin D3 2000IU/day 2) vitamin C: 500mg /day 3) quercetin 250mg day 4) zinc: 25mg /day 5) melatonin 5mg /nightly  I increase all of these if you get covid. Would also have a pulse ox at home.   For you: 16mg  twice a week Jim: 13.5mg  twice a week.   See you back in 6 months!  Dr. Rogers Blocker

## 2020-09-11 NOTE — Telephone Encounter (Signed)
CRITICAL VALUE STICKER  CRITICAL VALUE: WBC is 20.3 and vitamin D greater than 120.   [place] NOTIFIED: 09/11/2020 11:02am   Gevena Barre   MD NOTIFIED:  Team Rogers Blocker  TIME OF NOTIFICATION: 11:02 am RESPONSE:

## 2020-09-11 NOTE — Progress Notes (Signed)
Patient: Tracy Mckenzie MRN: [id number] DOB: [birth year] PCP: Tracy Flaming, MD      Subjective:  Chief Complaint  Patient presents with  . Hypertension  . Hyperlipidemia  . osteopenia    HPI: The patient is a 65 y.o. female who presents today for routine follow up of chronic conditions.   Hypertension: Here for follow up of hypertension.  Currently on hctz 12.5mg  daily and losartan 50mg  twice a day. Takes medication as prescribed and denies any side effects. Exercise includes none. Weight has been increasing slowly. Denies any chest pain, headaches, shortness of breath, vision changes, swelling in lower extremities.   Hyperlipidemia She is on no medication. Can not tolerate zetia or statins.  The 10-year ASCVD risk score Tracy Mckenzie Tracy Mckenzie., et al., 2013) is: 11.6%  Osteoporosis She was on prolia, but she thinks it may have contributed to her rash on her face so she just missed her 6 months f/u dosage.  She has already been on x2 bisphophonates. She has a hard time walking due to knee OA. On calcium and vitamin D.   Also would like to discuss vitamins for covid. Already on vitamin D, but sounds like taking a lot.   Review of Systems  Constitutional: Negative for chills, fatigue and fever.  HENT: Negative for dental problem, ear pain, hearing loss and trouble swallowing.   Eyes: Negative for visual disturbance.  Respiratory: Negative for cough, chest tightness and shortness of breath.   Cardiovascular: Negative for chest pain, palpitations and leg swelling.  Gastrointestinal: Negative for abdominal pain, blood in stool, diarrhea and nausea.  Endocrine: Negative for cold intolerance, polydipsia, polyphagia and polyuria.  Genitourinary: Negative for dysuria and hematuria.  Musculoskeletal: Negative for arthralgias.  Skin: Positive for rash.  Neurological: Negative for dizziness and headaches.  Psychiatric/Behavioral: Negative for dysphoric mood and sleep disturbance. The patient is not  nervous/anxious.     Allergies Patient is allergic to meperidine, simvastatin, oxycodone-acetaminophen, and vicodin [hydrocodone-acetaminophen].  Past Medical History Patient  has a past medical history of Arthritis, Bleeding ulcer, Elevated cholesterol, Hiatal hernia, High blood pressure, Kidney cysts, Liver cyst, Nerve pain, and Peptic ulcer disease.  Surgical History Patient  has a past surgical history that includes Appendectomy (1992); Tonsillectomy (1967); laparoscopy (1981); Total abdominal hysterectomy (1992); Tibia fracture surgery (Right, 06/12/2000); Leg Surgery (Right, 11/23/2000); Knee arthroscopy (Right, 04/12/2001); Leg Surgery (Right, 02/08/2002); Meniscus repair (01/29/2003); and Breast cyst aspiration (Left, 2003).  Family History Pateint's family history includes Breast cancer in her maternal aunt and paternal aunt; COPD in her father and mother; Diabetes in her maternal grandfather; Kidney disease in her mother; Stroke in her paternal grandfather.  Social History Patient  reports that she has never smoked. She has never used smokeless tobacco. She reports that she does not drink alcohol and does not use drugs.    Objective: Vitals:   09/11/20 0805  BP: (!) 150/80  Pulse: 75  Temp: 97.7 F (36.5 C)  TempSrc: Temporal  SpO2: 96%  Weight: 178 lb (80.7 kg)  Height: 5' 6.75" (1.695 m)    Body mass index is 28.09 kg/m.  Physical Exam Vitals reviewed.  Constitutional:      Appearance: Normal appearance. She is well-developed and well-nourished.  HENT:     Head: Normocephalic and atraumatic.     Right Ear: Tympanic membrane, ear canal and external ear normal.     Left Ear: Tympanic membrane, ear canal and external ear normal.     Mouth/Throat:     Mouth:  Oropharynx is clear and moist.  Eyes:     Extraocular Movements: EOM normal.     Conjunctiva/sclera: Conjunctivae normal.     Pupils: Pupils are equal, round, and reactive to light.  Neck:     Thyroid: No  thyromegaly.  Cardiovascular:     Rate and Rhythm: Normal rate and regular rhythm.     Pulses: Normal pulses and intact distal pulses.     Heart sounds: Normal heart sounds. No murmur heard.   Pulmonary:     Effort: Pulmonary effort is normal.     Breath sounds: Normal breath sounds.  Abdominal:     General: Bowel sounds are normal. There is no distension.     Palpations: Abdomen is soft.     Tenderness: There is no abdominal tenderness.  Musculoskeletal:     Cervical back: Normal range of motion and neck supple.  Lymphadenopathy:     Cervical: No cervical adenopathy.  Skin:    General: Skin is warm and dry.     Capillary Refill: Capillary refill takes less than 2 seconds.     Findings: Rash (on face ) present.  Neurological:     General: No focal deficit present.     Mental Status: She is alert and oriented to person, place, and time.     Cranial Nerves: No cranial nerve deficit.     Coordination: Coordination normal.     Deep Tendon Reflexes: Reflexes normal.  Psychiatric:        Mood and Affect: Mood and affect normal.        Behavior: Behavior normal.        Assessment/plan: 1. Essential hypertension Above goal, but I have tried to increase medication and she did not tolerate well. Will continue with current meds of hctz 12.5mg  daily and losartan 50mg  BID. May discuss again need to try increase of hctz to 25mg /day or 12.5mg  BID. Encouraged more exercise and weight loss to assist as well. Will see her back in 6 months time.  - CBC with Differential/Platelet - Comprehensive metabolic panel - Microalbumin / creatinine urine ratio  2. Mixed hyperlipidemia ? If she would be interested in repatha. Very sensitive to drugs, but we could discuss at f/u.  - Lipid panel  3. Age-related osteoporosis without current pathological fracture  - VITAMIN D 25 Hydroxy (Vit-D Deficiency, Fractures)  Went over covid prophylaxis vitamins in detail. Likely on too much D, checking today.    This visit occurred during the SARS-CoV-2 public health emergency.  Safety protocols were in place, including screening questions prior to the visit, additional usage of staff PPE, and extensive cleaning of exam room while observing appropriate contact time as indicated for disinfecting solutions.     Return in about 6 months (around 03/11/2021) for htn f/u .   Tracy Flaming, MD [place]   09/11/2020

## 2020-10-06 ENCOUNTER — Ambulatory Visit (INDEPENDENT_AMBULATORY_CARE_PROVIDER_SITE_OTHER): Payer: Medicare HMO | Admitting: Family Medicine

## 2020-10-06 ENCOUNTER — Encounter: Payer: Self-pay | Admitting: Family Medicine

## 2020-10-06 ENCOUNTER — Other Ambulatory Visit: Payer: Self-pay

## 2020-10-06 VITALS — BP 127/79 | HR 95 | Temp 98.2°F | Ht 66.75 in | Wt 183.0 lb

## 2020-10-06 DIAGNOSIS — L309 Dermatitis, unspecified: Secondary | ICD-10-CM

## 2020-10-06 NOTE — Patient Instructions (Signed)
If itching in eyeball, get ketotifen drop. This is an anti itch drop  I like a drop called thera tears to help lubricate the eye. Can use every 1-2 hours.   Cool compresses and for itching I would get hydrocortisone over the counter.   Watch the skin, if starts to get really red around eye we need to do antibiotics.

## 2020-10-06 NOTE — Progress Notes (Signed)
Patient: Tracy Mckenzie MRN: [id number] DOB: 05/15/1956 PCP: Orma Flaming, MD     Subjective:  Chief Complaint  Patient presents with  . Eye Problem  . Rash    HPI: The patient is a 65 y.o. female who presents today for rash on her face and swollen eye. Her rash is the same as her chronic rash she has had, but the swelling of her eye is new. She woke up with her left eye swollen yesterday. On Saturday it was tender to touch on her eyelid/top of her eye. She has been using clear eyes and she took benadryl last night. She states the swelling is better and has moved more to the medial aspect of her eye. Still very itchy on eyelid. She has no new products, eyeshadow, wash, etc. No vision changes or light sensitivity. No pain opening or closing her eye and no gritty/sand feeling in her eye.   Review of Systems  Constitutional: Negative for fatigue and fever.  HENT: Negative for congestion and ear pain.   Eyes: Positive for itching. Negative for photophobia, pain, discharge and visual disturbance.  Respiratory: Negative for cough and shortness of breath.     Allergies Patient is allergic to meperidine, simvastatin, oxycodone-acetaminophen, and vicodin [hydrocodone-acetaminophen].  Past Medical History Patient  has a past medical history of Arthritis, Bleeding ulcer, Elevated cholesterol, Hiatal hernia, High blood pressure, Kidney cysts, Liver cyst, Nerve pain, and Peptic ulcer disease.  Surgical History Patient  has a past surgical history that includes Appendectomy (1992); Tonsillectomy (1967); laparoscopy (1981); Total abdominal hysterectomy (1992); Tibia fracture surgery (Right, 06/12/2000); Leg Surgery (Right, 11/23/2000); Knee arthroscopy (Right, 04/12/2001); Leg Surgery (Right, 02/08/2002); Meniscus repair (01/29/2003); and Breast cyst aspiration (Left, 2003).  Family History Pateint's family history includes Breast cancer in her maternal aunt and paternal aunt; COPD in her father  and mother; Diabetes in her maternal grandfather; Kidney disease in her mother; Stroke in her paternal grandfather.  Social History Patient  reports that she has never smoked. She has never used smokeless tobacco. She reports that she does not drink alcohol and does not use drugs.    Objective: Vitals:   10/06/20 0840  BP: 127/79  Pulse: 95  Temp: 98.2 F (36.8 C)  SpO2: 98%  Weight: 183 lb (83 kg)  Height: 5' 6.75" (1.695 m)    Body mass index is 28.88 kg/m.  Physical Exam Vitals reviewed.  Constitutional:      Appearance: Normal appearance. She is normal weight.  HENT:     Head: Normocephalic and atraumatic.     Right Ear: Tympanic membrane, ear canal and external ear normal.     Left Ear: Tympanic membrane, ear canal and external ear normal.     Nose: Nose normal.     Mouth/Throat:     Mouth: Mucous membranes are moist.  Eyes:     General:        Right eye: No discharge.        Left eye: No discharge.     Extraocular Movements: Extraocular movements intact.     Conjunctiva/sclera: Conjunctivae normal.     Pupils: Pupils are equal, round, and reactive to light.  Cardiovascular:     Rate and Rhythm: Normal rate and regular rhythm.     Heart sounds: Normal heart sounds.  Pulmonary:     Effort: Pulmonary effort is normal.     Breath sounds: Normal breath sounds.  Skin:    Comments: Flaky/mild erythema on left upper eyelid. No findings  concerning for cellulitis.   Neurological:     Mental Status: She is alert.        Assessment/plan: 1. Dermatitis Seborrheic dermatitis of eyelid vs. Regular dermatitis. She also has chronic dermatitis that could be also related to this that she is being followed by derm for. Recommended otc hydrocortisone cream BID, thera tear drops and if needed ketotifen. She can't tell if eye itching on eyelid or eyeball. Cool compresses if needed. Precautions discussed for preseptal cellulitis/visoin changes, etc.    This visit occurred  during the SARS-CoV-2 public health emergency.  Safety protocols were in place, including screening questions prior to the visit, additional usage of staff PPE, and extensive cleaning of exam room while observing appropriate contact time as indicated for disinfecting solutions.     Return if symptoms worsen or fail to improve.     Orma Flaming, MD [place]  10/06/2020

## 2020-10-09 ENCOUNTER — Other Ambulatory Visit: Payer: Self-pay | Admitting: Family Medicine

## 2020-10-09 DIAGNOSIS — D72829 Elevated white blood cell count, unspecified: Secondary | ICD-10-CM

## 2020-10-16 ENCOUNTER — Other Ambulatory Visit: Payer: Self-pay

## 2020-10-16 ENCOUNTER — Other Ambulatory Visit (INDEPENDENT_AMBULATORY_CARE_PROVIDER_SITE_OTHER): Payer: Medicare HMO

## 2020-10-16 DIAGNOSIS — D72829 Elevated white blood cell count, unspecified: Secondary | ICD-10-CM | POA: Diagnosis not present

## 2020-10-16 LAB — CBC WITH DIFFERENTIAL/PLATELET
Basophils Absolute: 0.1 10*3/uL (ref 0.0–0.1)
Basophils Relative: 0.9 % (ref 0.0–3.0)
Eosinophils Absolute: 0.5 10*3/uL (ref 0.0–0.7)
Eosinophils Relative: 4.3 % (ref 0.0–5.0)
HCT: 37.3 % (ref 36.0–46.0)
Hemoglobin: 12.4 g/dL (ref 12.0–15.0)
Lymphocytes Relative: 21.5 % (ref 12.0–46.0)
Lymphs Abs: 2.6 10*3/uL (ref 0.7–4.0)
MCHC: 33.3 g/dL (ref 30.0–36.0)
MCV: 85.9 fl (ref 78.0–100.0)
Monocytes Absolute: 0.7 10*3/uL (ref 0.1–1.0)
Monocytes Relative: 6.1 % (ref 3.0–12.0)
Neutro Abs: 8 10*3/uL — ABNORMAL HIGH (ref 1.4–7.7)
Neutrophils Relative %: 67.2 % (ref 43.0–77.0)
Platelets: 365 10*3/uL (ref 150.0–400.0)
RBC: 4.34 Mil/uL (ref 3.87–5.11)
RDW: 14 % (ref 11.5–15.5)
WBC: 11.9 10*3/uL — ABNORMAL HIGH (ref 4.0–10.5)

## 2020-10-24 ENCOUNTER — Ambulatory Visit (INDEPENDENT_AMBULATORY_CARE_PROVIDER_SITE_OTHER): Payer: Medicare HMO

## 2020-10-24 ENCOUNTER — Other Ambulatory Visit: Payer: Self-pay | Admitting: Family Medicine

## 2020-10-24 ENCOUNTER — Telehealth: Payer: Self-pay | Admitting: Family Medicine

## 2020-10-24 DIAGNOSIS — Z0000 Encounter for general adult medical examination without abnormal findings: Secondary | ICD-10-CM | POA: Diagnosis not present

## 2020-10-24 NOTE — Telephone Encounter (Signed)
I gave message to pt below. She says that she did not think she was taking one of them, but she just wanted to be sure. She voiced understanding and does not have any further questions or concerns.

## 2020-10-24 NOTE — Progress Notes (Addendum)
Virtual Visit via Telephone Note  I connected with  Tracy Mckenzie on 10/24/20 at 11:00 AM EDT by telephone and verified that I am speaking with the correct person using two identifiers.  Medicare Annual Wellness visit completed telephonically due to Covid-19 pandemic.   Persons participating in this call: This Health Coach and this patient.   Location: Patient: Home Provider: Office    I discussed the limitations, risks, security and privacy concerns of performing an evaluation and management service by telephone and the availability of in person appointments. The patient expressed understanding and agreed to proceed.  Unable to perform video visit due to video visit attempted and failed and/or patient does not have video capability.   Some vital signs may be absent or patient reported.   Willette Brace, LPN    Subjective:   Tracy Mckenzie is a 65 y.o. female who presents for an Initial Medicare Annual Wellness Visit.  Review of Systems     Cardiac Risk Factors include: advanced age (>30men, >33 women);hypertension;dyslipidemia     Objective:    Today's Vitals   10/24/20 1000  PainSc: 1    There is no height or weight on file to calculate BMI.  Advanced Directives 10/24/2020 04/14/2020 09/09/2019 10/04/2016  Does Patient Have a Medical Advance Directive? Yes No No Yes  Type of Paramedic of [place];Living will - - [place];Living will  Copy of [place] in Chart? No - copy requested - - -  Would patient like information on creating a medical advance directive? - No - Patient declined No - Patient declined -    Current Medications (verified) Outpatient Encounter Medications as of 10/24/2020  Medication Sig  . albuterol (VENTOLIN HFA) 108 (90 Base) MCG/ACT inhaler Inhale 2 puffs into the lungs every 6 (six) hours as needed for wheezing or shortness of breath.  Marland Kitchen azelastine (ASTELIN) 0.1 % nasal spray Place 1  spray into both nostrils 2 (two) times daily. Use in each nostril as directed  . Coenzyme Q10 (CO Q-10) 100 MG CAPS Take 1 tablet by mouth daily.  . cyclobenzaprine (FLEXERIL) 10 MG tablet TAKE ONE TABLET BY MOUTH THREE TIMES A DAY AS NEEDED FOR MUSCLE SPASMS  . fluticasone (CUTIVATE) 0.05 % cream Apply bid 1 wk on, 1 wk off repeat  . hydrochlorothiazide (MICROZIDE) 12.5 MG capsule TAKE ONE CAPSULE BY MOUTH DAILY  . hydrOXYzine (ATARAX/VISTARIL) 25 MG tablet Take 1 tablet (25 mg total) by mouth 3 (three) times daily as needed for itching.  . levocetirizine (XYZAL) 5 MG tablet Take 5 mg by mouth 2 (two) times daily.  Marland Kitchen losartan (COZAAR) 50 MG tablet TAKE ONE TABLET BY MOUTH TWICE A DAY FOR BLOOD PRESSURE  . magnesium 30 MG tablet Take 3 tablets by mouth daily.  . metroNIDAZOLE (METROCREAM) 0.75 % cream Apply to face bid  . montelukast (SINGULAIR) 10 MG tablet TAKE ONE TABLET BY MOUTH EVERY NIGHT AT BEDTIME  . Multiple Vitamins-Minerals (PRESERVISION AREDS PO) Take by mouth.  . Nutritional Supplements (JUICE PLUS FIBRE PO) Take by mouth.  Marland Kitchen omeprazole (PRILOSEC) 40 MG capsule TAKE ONE CAPSULE BY MOUTH DAILY 30 MINUTES BEFORE BREAKFAST  . Potassium 99 MG TABS Take 1 tablet by mouth daily.  Marland Kitchen QUERCETIN PO Take by mouth.  . Spacer/Aero-Holding Chambers (PRO COMFORT SPACER ADULT) MISC 1 Device by Does not apply route as needed.  . SUPER B COMPLEX/C PO Take by mouth.  . tacrolimus (PROTOPIC) 0.1 % ointment  Apply to face qhs every other week  . traMADol-acetaminophen (ULTRACET) 37.5-325 MG tablet Take 1 tablet by mouth every 8 (eight) hours as needed.  . Cyanocobalamin (VITAMIN B-12) 5000 MCG TBDP Take 1 tablet by mouth daily.  (Patient not taking: Reported on 10/24/2020)  . nitroGLYCERIN (NITROSTAT) 0.4 MG SL tablet Place under the tongue. (Patient not taking: Reported on 10/24/2020)  . [DISCONTINUED] COCONUT OIL PO Take 1,000 mg by mouth daily.    Facility-Administered Encounter Medications as of  10/24/2020  Medication  . 0.9 %  sodium chloride infusion    Allergies (verified) Meperidine, Simvastatin, Oxycodone-acetaminophen, and Vicodin [hydrocodone-acetaminophen]   History: Past Medical History:  Diagnosis Date  . Arthritis   . Bleeding ulcer   . Elevated cholesterol   . Hiatal hernia   . High blood pressure   . Kidney cysts   . Liver cyst   . Nerve pain    L sciatic pain radiating to R  . Peptic ulcer disease    Past Surgical History:  Procedure Laterality Date  . APPENDECTOMY  1992  . BREAST CYST ASPIRATION Left 2003  . KNEE ARTHROSCOPY Right 04/12/2001   torn cartilage  . LAPAROSCOPY  1981   for endometriosis  . LEG SURGERY Right 11/23/2000   to remove screws  . LEG SURGERY Right 02/08/2002   to remove rod  . MENISCUS REPAIR  01/29/2003  . TIBIA FRACTURE SURGERY Right 06/12/2000   Multiple surgeries  . TONSILLECTOMY  1967  . TOTAL ABDOMINAL HYSTERECTOMY  1992   Family History  Problem Relation Age of Onset  . Kidney disease Mother   . COPD Mother   . Breast cancer Maternal Aunt   . Breast cancer Paternal Aunt   . COPD Father   . Diabetes Maternal Grandfather   . Stroke Paternal Grandfather   . Colon cancer Neg Hx    Social History   Socioeconomic History  . Marital status: Married    Spouse name: Not on file  . Number of children: Not on file  . Years of education: Not on file  . Highest education level: Not on file  Occupational History  . Not on file  Tobacco Use  . Smoking status: Never Smoker  . Smokeless tobacco: Never Used  Substance and Sexual Activity  . Alcohol use: No  . Drug use: No  . Sexual activity: Not on file  Other Topics Concern  . Not on file  Social History Narrative  . Not on file   Social Determinants of Health   Financial Resource Strain: Low Risk   . Difficulty of Paying Living Expenses: Not hard at all  Food Insecurity: No Food Insecurity  . Worried About Charity fundraiser in the Last Year: Never true   . Ran Out of Food in the Last Year: Never true  Transportation Needs: No Transportation Needs  . Lack of Transportation (Medical): No  . Lack of Transportation (Non-Medical): No  Physical Activity: Inactive  . Days of Exercise per Week: 0 days  . Minutes of Exercise per Session: 0 min  Stress: Stress Concern Present  . Feeling of Stress : To some extent  Social Connections: Moderately Isolated  . Frequency of Communication with Friends and Family: More than three times a week  . Frequency of Social Gatherings with Friends and Family: Twice a week  . Attends Religious Services: Never  . Active Member of Clubs or Organizations: No  . Attends Archivist Meetings: Never  .  Marital Status: Married    Tobacco Counseling Counseling given: Not Answered   Clinical Intake:  Pre-visit preparation completed: Yes  Pain : 0-10 Pain Score: 1  Pain Type: Chronic pain Pain Location: Back (both leg mostly left) Pain Orientation: Right,Left Pain Descriptors / Indicators: Aching Pain Onset: More than a month ago Pain Frequency: Intermittent     BMI - recorded: 28.89 Nutritional Status: BMI 25 -29 Overweight Nutritional Risks: Nausea/ vomitting/ diarrhea (realted to medication) Diabetes: No  How often do you need to have someone help you when you read instructions, pamphlets, or other written materials from your doctor or pharmacy?: 1 - Never  Diabetic?No  Interpreter Needed?: No  Information entered by :: Charlott Rakes, LPN   Activities of Daily Living In your present state of health, do you have any difficulty performing the following activities: 10/24/2020 03/07/2020  Hearing? Y N  Comment mild loss -  Vision? N N  Difficulty concentrating or making decisions? N N  Walking or climbing stairs? Y N  Dressing or bathing? N N  Doing errands, shopping? N N  Preparing Food and eating ? N -  Using the Toilet? N -  In the past six months, have you accidently leaked  urine? Y -  Comment wears a pad -  Do you have problems with loss of bowel control? N -  Managing your Medications? N -  Managing your Finances? N -  Housekeeping or managing your Housekeeping? N -  Some recent data might be hidden    Patient Care Team: Orma Flaming, MD as PCP - General (Family Medicine)  Indicate any recent Medical Services you may have received from other than [facility] providers in the past year (date may be approximate).     Assessment:   This is a routine wellness examination for Tracy Mckenzie.  Hearing/Vision screen  Hearing Screening   125Hz  250Hz  500Hz  1000Hz  2000Hz  3000Hz  4000Hz  6000Hz  8000Hz   Right ear:           Left ear:           Comments: Pt stated mild loss  Vision Screening Comments: Pt needs to make an appt   Dietary issues and exercise activities discussed: Current Exercise Habits: The patient does not participate in regular exercise at present  Goals    . Patient Stated     Lose weight       Depression Screen PHQ 2/9 Scores 10/24/2020 05/07/2020 08/13/2019  PHQ - 2 Score 0 0 0    Fall Risk Fall Risk  10/24/2020  Falls in the past year? 0  Number falls in past yr: 0  Injury with Fall? 0  Risk for fall due to : Impaired vision;Impaired balance/gait;Impaired mobility  Follow up Falls prevention discussed    FALL RISK PREVENTION PERTAINING TO THE HOME:  Any stairs in or around the home? Yes  If so, are there any without handrails? No  Home free of loose throw rugs in walkways, pet beds, electrical cords, etc? Yes  Adequate lighting in your home to reduce risk of falls? Yes   ASSISTIVE DEVICES UTILIZED TO PREVENT FALLS:  Life alert? No  Use of a cane, walker or w/c? Yes  Grab bars in the bathroom? Yes  Shower chair or bench in shower? Yes  Elevated toilet seat or a handicapped toilet? Yes   TIMED UP AND GO:  Was the test performed? No     Cognitive Function:     6CIT Screen 10/24/2020  What Year?  0 points  What month? 0 points   Count back from 20 0 points  Months in reverse 0 points  Repeat phrase 0 points    Immunizations Immunization History  Administered Date(s) Administered  . Influenza, High Dose Seasonal PF 05/10/2019  . Influenza-Unspecified 05/30/2015, 08/01/2017  . Moderna Sars-Covid-2 Vaccination 09/15/2019, 10/13/2019  . Pneumococcal Polysaccharide-23 07/11/2019  . Tdap 05/02/2014  . Zoster 05/31/2015, 06/06/2015    TDAP status: Up to date  Flu Vaccine status: Declined, Education has been provided regarding the importance of this vaccine but patient still declined. Advised may receive this vaccine at local pharmacy or Health Dept. Aware to provide a copy of the vaccination record if obtained from local pharmacy or Health Dept. Verbalized acceptance and understanding.  Pneumococcal vaccine status: Due, Education has been provided regarding the importance of this vaccine. Advised may receive this vaccine at local pharmacy or Health Dept. Aware to provide a copy of the vaccination record if obtained from local pharmacy or Health Dept. Verbalized acceptance and understanding.  Covid-19 vaccine status: Completed vaccines  Qualifies for Shingles Vaccine? Yes   Zostavax completed Yes   Shingrix Completed?: No.    Education has been provided regarding the importance of this vaccine. Patient has been advised to call insurance company to determine out of pocket expense if they have not yet received this vaccine. Advised may also receive vaccine at local pharmacy or Health Dept. Verbalized acceptance and understanding.  Screening Tests Health Maintenance  Topic Date Due  . COLONOSCOPY (Pts 45-23yrs Insurance coverage will need to be confirmed)  Never done  . COVID-19 Vaccine (3 - Booster for Moderna series) 04/14/2020  . PNA vac Low Risk Adult (2 of 2 - PCV13) 07/10/2020  . INFLUENZA VACCINE  10/30/2020 (Originally 03/02/2020)  . MAMMOGRAM  11/13/2021  . TETANUS/TDAP  05/02/2024  . DEXA SCAN  Completed   . Hepatitis C Screening  Completed  . HPV VACCINES  Aged Out    Health Maintenance  Health Maintenance Due  Topic Date Due  . COLONOSCOPY (Pts 45-15yrs Insurance coverage will need to be confirmed)  Never done  . COVID-19 Vaccine (3 - Booster for Moderna series) 04/14/2020  . PNA vac Low Risk Adult (2 of 2 - PCV13) 07/10/2020    Colorectal cancer screening: Referral to GI placed Pt stated she is following up with another Dr to get this scheduled . Pt aware the office will call re: appt.  Mammogram status: Completed 11/14/19. Repeat every year  Bone Density status: Completed 11/14/19. Results reflect: Bone density results: NORMAL. Repeat every 0 years.   Additional Screening:  Hepatitis C Screening: Completed 08/13/19  Vision Screening: Recommended annual ophthalmology exams for early detection of glaucoma and other disorders of the eye. Is the patient up to date with their annual eye exam?  No  Who is the provider or what is the name of the office in which the patient attends annual eye exams? Pt stated she will make an appt  If pt is not established with a provider, would they like to be referred to a provider to establish care? No .   Dental Screening: Recommended annual dental exams for proper oral hygiene  Community Resource Referral / Chronic Care Management: CRR required this visit?  No   CCM required this visit?  No      Plan:     I have personally reviewed and noted the following in the patient's chart:   . Medical and social history . Use  of alcohol, tobacco or illicit drugs  . Current medications and supplements . Functional ability and status . Nutritional status . Physical activity . Advanced directives . List of other physicians . Hospitalizations, surgeries, and ER visits in previous 12 months . Vitals . Screenings to include cognitive, depression, and falls . Referrals and appointments  In addition, I have reviewed and discussed with patient  certain preventive protocols, quality metrics, and best practice recommendations. A written personalized care plan for preventive services as well as general preventive health recommendations were provided to patient.     Willette Brace, LPN   8/33/8250   Nurse Notes: Pt wanted to know if she should continue on HCTZ related to the recent recall on [place] commercials, Please advise via my chart or if I need to reach back out .

## 2020-10-24 NOTE — Telephone Encounter (Signed)
Please let her know she can continue taking her hctz pill. The recalled pfizer drugs were combination drugs only. She is not taking one of those.  Thanks,  Dr. Rogers Blocker

## 2020-10-24 NOTE — Patient Instructions (Addendum)
Tracy Mckenzie , Thank you for taking time to come for your Medicare Wellness Visit. I appreciate your ongoing commitment to your health goals. Please review the following plan we discussed and let me know if I can assist you in the future.   Screening recommendations/referrals: Colonoscopy: Pt stated she will follow up with Dr to make appt Mammogram: Done 11/14/19 Bone Density: Done 11/14/19 Recommended yearly ophthalmology/optometry visit for glaucoma screening and checkup Recommended yearly dental visit for hygiene and checkup  Vaccinations: Influenza vaccine: Postponed  Pneumococcal vaccine: Due and discussed Tdap vaccine: Up to date Shingles vaccine: Shingrix, Please contact your pharmacy for coverage information.    Covid-19:Completed 2/13 & 10/13/19 will call back with Booster dates  Advanced directives: Please bring a copy of your health care power of attorney and living will to the office at your convenience.  Conditions/risks identified: Lose weight   Next appointment: Follow up in one year for your annual wellness visit     Preventive Care 65 Years and Older, Female Preventive care refers to lifestyle choices and visits with your health care provider that can promote health and wellness. What does preventive care include?  A yearly physical exam. This is also called an annual well check.  Dental exams once or twice a year.  Routine eye exams. Ask your health care provider how often you should have your eyes checked.  Personal lifestyle choices, including:  Daily care of your teeth and gums.  Regular physical activity.  Eating a healthy diet.  Avoiding tobacco and drug use.  Limiting alcohol use.  Practicing safe sex.  Taking low-dose aspirin every day.  Taking vitamin and mineral supplements as recommended by your health care provider. What happens during an annual well check? The services and screenings done by your health care provider during your annual  well check will depend on your age, overall health, lifestyle risk factors, and family history of disease. Counseling  Your health care provider may ask you questions about your:  Alcohol use.  Tobacco use.  Drug use.  Emotional well-being.  Home and relationship well-being.  Sexual activity.  Eating habits.  History of falls.  Memory and ability to understand (cognition).  Work and work Statistician.  Reproductive health. Screening  You may have the following tests or measurements:  Height, weight, and BMI.  Blood pressure.  Lipid and cholesterol levels. These may be checked every 5 years, or more frequently if you are over 70 years old.  Skin check.  Lung cancer screening. You may have this screening every year starting at age 36 if you have a 30-pack-year history of smoking and currently smoke or have quit within the past 15 years.  Fecal occult blood test (FOBT) of the stool. You may have this test every year starting at age 69.  Flexible sigmoidoscopy or colonoscopy. You may have a sigmoidoscopy every 5 years or a colonoscopy every 10 years starting at age 42.  Hepatitis C blood test.  Hepatitis B blood test.  Sexually transmitted disease (STD) testing.  Diabetes screening. This is done by checking your blood sugar (glucose) after you have not eaten for a while (fasting). You may have this done every 1-3 years.  Bone density scan. This is done to screen for osteoporosis. You may have this done starting at age 97.  Mammogram. This may be done every 1-2 years. Talk to your health care provider about how often you should have regular mammograms. Talk with your health care provider about your  test results, treatment options, and if necessary, the need for more tests. Vaccines  Your health care provider may recommend certain vaccines, such as:  Influenza vaccine. This is recommended every year.  Tetanus, diphtheria, and acellular pertussis (Tdap, Td) vaccine.  You may need a Td booster every 10 years.  Zoster vaccine. You may need this after age 78.  Pneumococcal 13-valent conjugate (PCV13) vaccine. One dose is recommended after age 18.  Pneumococcal polysaccharide (PPSV23) vaccine. One dose is recommended after age 73. Talk to your health care provider about which screenings and vaccines you need and how often you need them. This information is not intended to replace advice given to you by your health care provider. Make sure you discuss any questions you have with your health care provider. Document Released: 08/15/2015 Document Revised: 04/07/2016 Document Reviewed: 05/20/2015 Elsevier Interactive Patient Education  2017 [place] Prevention in the Home Falls can cause injuries. They can happen to people of all ages. There are many things you can do to make your home safe and to help prevent falls. What can I do on the outside of my home?  Regularly fix the edges of walkways and driveways and fix any cracks.  Remove anything that might make you trip as you walk through a door, such as a raised step or threshold.  Trim any bushes or trees on the path to your home.  Use bright outdoor lighting.  Clear any walking paths of anything that might make someone trip, such as rocks or tools.  Regularly check to see if handrails are loose or broken. Make sure that both sides of any steps have handrails.  Any raised decks and porches should have guardrails on the edges.  Have any leaves, snow, or ice cleared regularly.  Use sand or salt on walking paths during winter.  Clean up any spills in your garage right away. This includes oil or grease spills. What can I do in the bathroom?  Use night lights.  Install grab bars by the toilet and in the tub and shower. Do not use towel bars as grab bars.  Use non-skid mats or decals in the tub or shower.  If you need to sit down in the shower, use a plastic, non-slip stool.  Keep the  floor dry. Clean up any water that spills on the floor as soon as it happens.  Remove soap buildup in the tub or shower regularly.  Attach bath mats securely with double-sided non-slip rug tape.  Do not have throw rugs and other things on the floor that can make you trip. What can I do in the bedroom?  Use night lights.  Make sure that you have a light by your bed that is easy to reach.  Do not use any sheets or blankets that are too big for your bed. They should not hang down onto the floor.  Have a firm chair that has side arms. You can use this for support while you get dressed.  Do not have throw rugs and other things on the floor that can make you trip. What can I do in the kitchen?  Clean up any spills right away.  Avoid walking on wet floors.  Keep items that you use a lot in easy-to-reach places.  If you need to reach something above you, use a strong step stool that has a grab bar.  Keep electrical cords out of the way.  Do not use floor polish or wax that  makes floors slippery. If you must use wax, use non-skid floor wax.  Do not have throw rugs and other things on the floor that can make you trip. What can I do with my stairs?  Do not leave any items on the stairs.  Make sure that there are handrails on both sides of the stairs and use them. Fix handrails that are broken or loose. Make sure that handrails are as long as the stairways.  Check any carpeting to make sure that it is firmly attached to the stairs. Fix any carpet that is loose or worn.  Avoid having throw rugs at the top or bottom of the stairs. If you do have throw rugs, attach them to the floor with carpet tape.  Make sure that you have a light switch at the top of the stairs and the bottom of the stairs. If you do not have them, ask someone to add them for you. What else can I do to help prevent falls?  Wear shoes that:  Do not have high heels.  Have rubber bottoms.  Are comfortable and fit  you well.  Are closed at the toe. Do not wear sandals.  If you use a stepladder:  Make sure that it is fully opened. Do not climb a closed stepladder.  Make sure that both sides of the stepladder are locked into place.  Ask someone to hold it for you, if possible.  Clearly mark and make sure that you can see:  Any grab bars or handrails.  First and last steps.  Where the edge of each step is.  Use tools that help you move around (mobility aids) if they are needed. These include:  Canes.  Walkers.  Scooters.  Crutches.  Turn on the lights when you go into a dark area. Replace any light bulbs as soon as they burn out.  Set up your furniture so you have a clear path. Avoid moving your furniture around.  If any of your floors are uneven, fix them.  If there are any pets around you, be aware of where they are.  Review your medicines with your doctor. Some medicines can make you feel dizzy. This can increase your chance of falling. Ask your doctor what other things that you can do to help prevent falls. This information is not intended to replace advice given to you by your health care provider. Make sure you discuss any questions you have with your health care provider. Document Released: 05/15/2009 Document Revised: 12/25/2015 Document Reviewed: 08/23/2014 Elsevier Interactive Patient Education  2017 [employer].

## 2020-11-02 ENCOUNTER — Encounter: Payer: Self-pay | Admitting: Family Medicine

## 2020-11-03 ENCOUNTER — Other Ambulatory Visit: Payer: Self-pay | Admitting: Family Medicine

## 2020-11-03 MED ORDER — HYDROCHLOROTHIAZIDE 12.5 MG PO CAPS: 12.5000 mg | ORAL_CAPSULE | Freq: Every day | ORAL | 3 refills | Status: DC

## 2020-11-03 MED ORDER — OMEPRAZOLE 40 MG PO CPDR: DELAYED_RELEASE_CAPSULE | ORAL | 3 refills | Status: DC

## 2020-11-03 MED ORDER — CYCLOBENZAPRINE HCL 10 MG PO TABS: ORAL_TABLET | ORAL | 2 refills | Status: DC

## 2020-11-03 NOTE — Telephone Encounter (Signed)
Pt is following up on this. She has lost her medication and requesting these be sent in ASAP

## 2020-11-14 ENCOUNTER — Encounter (HOSPITAL_COMMUNITY): Payer: Self-pay | Admitting: Emergency Medicine

## 2020-11-14 ENCOUNTER — Emergency Department (HOSPITAL_COMMUNITY): Payer: Medicare HMO

## 2020-11-14 ENCOUNTER — Emergency Department (HOSPITAL_COMMUNITY)
Admission: EM | Admit: 2020-11-14 | Discharge: 2020-11-15 | Disposition: A | Discharge status: Home / Self Care | Payer: Medicare HMO | Attending: Emergency Medicine | Admitting: Emergency Medicine

## 2020-11-14 ENCOUNTER — Other Ambulatory Visit: Payer: Self-pay

## 2020-11-14 DIAGNOSIS — Z79899 Other long term (current) drug therapy: Secondary | ICD-10-CM | POA: Diagnosis not present

## 2020-11-14 DIAGNOSIS — I10 Essential (primary) hypertension: Secondary | ICD-10-CM | POA: Insufficient documentation

## 2020-11-14 DIAGNOSIS — R0789 Other chest pain: Secondary | ICD-10-CM | POA: Diagnosis not present

## 2020-11-14 DIAGNOSIS — R079 Chest pain, unspecified: Secondary | ICD-10-CM | POA: Diagnosis not present

## 2020-11-14 LAB — CBC
HCT: 37.4 % (ref 36.0–46.0)
Hemoglobin: 12.2 g/dL (ref 12.0–15.0)
MCH: 28.8 pg (ref 26.0–34.0)
MCHC: 32.6 g/dL (ref 30.0–36.0)
MCV: 88.4 fL (ref 80.0–100.0)
Platelets: 372 10*3/uL (ref 150–400)
RBC: 4.23 MIL/uL (ref 3.87–5.11)
RDW: 13.2 % (ref 11.5–15.5)
WBC: 13.5 10*3/uL — ABNORMAL HIGH (ref 4.0–10.5)
nRBC: 0 % (ref 0.0–0.2)

## 2020-11-14 LAB — PROTIME-INR
INR: 0.9 (ref 0.8–1.2)
Prothrombin Time: 12.1 seconds (ref 11.4–15.2)

## 2020-11-14 NOTE — ED Triage Notes (Signed)
Patient reports non radiating left chest pain this evening , no SOB , denies emesis or diaphoresis. No cough or fever .  Tracy Mckenzie

## 2020-11-15 LAB — BASIC METABOLIC PANEL
Anion gap: 10 (ref 5–15)
BUN: 16 mg/dL (ref 8–23)
CO2: 24 mmol/L (ref 22–32)
Calcium: 9.3 mg/dL (ref 8.9–10.3)
Chloride: 105 mmol/L (ref 98–111)
Creatinine, Ser: 0.96 mg/dL (ref 0.44–1.00)
GFR, Estimated: >60 mL/min (ref >60)
Glucose, Bld: 133 mg/dL — ABNORMAL HIGH (ref 70–99)
Potassium: 3.5 mmol/L (ref 3.5–5.1)
Sodium: 139 mmol/L (ref 135–145)

## 2020-11-15 LAB — TROPONIN I (HIGH SENSITIVITY)
Troponin I (High Sensitivity): 4 ng/L (ref <18)
Troponin I (High Sensitivity): 4 ng/L (ref <18)

## 2020-11-15 LAB — D-DIMER, QUANTITATIVE: D-Dimer, Quant: 0.43 ug/mL-FEU (ref 0.00–0.50)

## 2020-11-15 MED ORDER — ASPIRIN 81 MG PO CHEW
162.0000 mg | CHEWABLE_TABLET | Freq: Once | ORAL | Status: AC
  Administered 2020-11-15: 162 mg via ORAL
  Filled 2020-11-15: qty 2

## 2020-11-15 NOTE — ED Provider Notes (Signed)
MSE was initiated and I personally evaluated the patient and placed orders (if any) at  12:28 AM on November 15, 2020.  Patient her with CP that started tonight at 7pm.  Reports that it has been persistent.  No SOB, diaphoresis, or vomiting.  No radiating pain.  Strong FH of heart disease.  Took 2 baby aspirin earlier and 2 doses of SL nitro with minimal relief.  Still having pain now.    Initial trop 4.  Discussed with patient that their care has been initiated.   They are counseled that they will need to remain in the ED until the completion of their workup, including full H&P and results of any tests.  Risks of leaving the emergency department prior to completion of treatment were discussed. Patient was advised to inform ED staff if they are leaving before their treatment is complete. The patient acknowledged these risks and time was allowed for questions.    The patient appears stable so that the remainder of the MSE may be completed by another provider.    Montine Circle, PA-C 11/15/20 0030    Merryl Hacker, MD 11/15/20 [phone]

## 2020-11-15 NOTE — Discharge Instructions (Addendum)
You were seen today for chest pain.  Your work-up today is reassuring.  This does not 100% exclude heart disease; however, it is reassuring.  It is important that you follow-up with cardiology for definitive stress testing.

## 2020-11-15 NOTE — ED Provider Notes (Signed)
[facility] EMERGENCY DEPARTMENT Provider Note   CSN: [id number] Arrival date & time: 11/14/20  2236     History Chief Complaint  Patient presents with  . Chest Pain    Tracy Mckenzie is a 65 y.o. female.  HPI  HPI: A 65 year old patient with a history of hypertension and hypercholesterolemia presents for evaluation of chest pain. Initial onset of pain was less than one hour ago. The patient's chest pain is well-localized and is not worse with exertion. The patient's chest pain is middle- or left-sided, is not described as heaviness/pressure/tightness, is not sharp and does not radiate to the arms/jaw/neck. The patient does not complain of nausea and denies diaphoresis. The patient has no history of stroke, has no history of peripheral artery disease, has not smoked in the past 90 days, denies any history of treated diabetes, has no relevant family history of coronary artery disease (first degree relative at less than age 45) and does not have an elevated BMI (>=30).   This is a 65 year old female with a history of hypertension, hyperlipidemia who presents with chest pain.  Patient reports onset of chest pain around 7 PM.  She states that it is sharp and under her left breast and just anterior to that.  It has remained constant.  It does not get better or worse with exertion or deep breathing.  No known history of blood clots, recent hospitalization, recent long travel.  Denies any significant shortness of breath or cough.  Does report extensive family history with siblings with coronary artery disease although no early family history.  She has no known history but does have the stated risk factors above.  Currently her pain is 2 out of 10.  She took aspirin and nitroglycerin with minimal relief.  Attempted to review patient's chart from [place].  She previously lived in [place] and reports that she had a stress test approximately 10 years ago prior to surgery.  Unfortunately, I  am unable to view prior EKGs or stress results.  Past Medical History:  Diagnosis Date  . Arthritis   . Bleeding ulcer   . Elevated cholesterol   . Hiatal hernia   . High blood pressure   . Kidney cysts   . Liver cyst   . Nerve pain    L sciatic pain radiating to R  . Peptic ulcer disease     Patient Active Problem List   Diagnosis Date Noted  . Unilateral primary osteoarthritis, right knee 09/05/2019  . Hyperlipidemia 08/15/2019  . Essential hypertension 08/13/2019  . GERD (gastroesophageal reflux disease) 08/13/2019  . Osteoporosis 08/13/2019  . Post-traumatic osteoarthritis of right knee 08/13/2019  . Hiatal hernia 08/13/2019  . Chronic left-sided low back pain without sciatica 08/13/2019    Past Surgical History:  Procedure Laterality Date  . APPENDECTOMY  1992  . BREAST CYST ASPIRATION Left 2003  . KNEE ARTHROSCOPY Right 04/12/2001   torn cartilage  . LAPAROSCOPY  1981   for endometriosis  . LEG SURGERY Right 11/23/2000   to remove screws  . LEG SURGERY Right 02/08/2002   to remove rod  . MENISCUS REPAIR  01/29/2003  . TIBIA FRACTURE SURGERY Right 06/12/2000   Multiple surgeries  . TONSILLECTOMY  1967  . TOTAL ABDOMINAL HYSTERECTOMY  1992     OB History   No obstetric history on file.     Family History  Problem Relation Age of Onset  . Kidney disease Mother   . COPD Mother   .  Breast cancer Maternal Aunt   . Breast cancer Paternal Aunt   . COPD Father   . Diabetes Maternal Grandfather   . Stroke Paternal Grandfather   . Colon cancer Neg Hx     Social History   Tobacco Use  . Smoking status: Never Smoker  . Smokeless tobacco: Never Used  Substance Use Topics  . Alcohol use: No  . Drug use: No    Home Medications Prior to Admission medications   Medication Sig Start Date End Date Taking? Authorizing Provider  albuterol (VENTOLIN HFA) 108 (90 Base) MCG/ACT inhaler Inhale 2 puffs into the lungs every 6 (six) hours as needed for wheezing  or shortness of breath. 01/30/20  Yes Orma Flaming, MD  azelastine (ASTELIN) 0.1 % nasal spray Place 1 spray into both nostrils 2 (two) times daily. Use in each nostril as directed Patient taking differently: Place 1 spray into both nostrils 2 (two) times daily as needed for allergies or rhinitis. 01/11/20  Yes Orma Flaming, MD  Coenzyme Q10 (CO Q-10) 100 MG CAPS Take 100 mg by mouth daily.   Yes [provider]  cyclobenzaprine (FLEXERIL) 10 MG tablet Take 1/2-1 tablet as needed for muscle spasms BID Patient taking differently: Take 10 mg by mouth 3 (three) times daily as needed for muscle spasms. 11/03/20  Yes Orma Flaming, MD  fluticasone (CUTIVATE) 0.05 % cream Apply 1 application topically as needed (rash). 06/12/20  Yes [provider]  hydrochlorothiazide (MICROZIDE) 12.5 MG capsule Take 1 capsule (12.5 mg total) by mouth daily. 11/03/20  Yes Orma Flaming, MD  hydrOXYzine (ATARAX/VISTARIL) 25 MG tablet TAKE 1 TABLET BY MOUTH THREE TIMES A DAY AS NEEDED FOR ITCHING Patient taking differently: Take 25 mg by mouth every 8 (eight) hours as needed for itching. 11/03/20  Yes Orma Flaming, MD  levocetirizine (XYZAL) 5 MG tablet Take 5 mg by mouth 2 (two) times daily.   Yes [provider]  losartan (COZAAR) 50 MG tablet TAKE ONE TABLET BY MOUTH TWICE A DAY FOR FOR BLOOD PRESSURE Patient taking differently: Take 50 mg by mouth in the morning and at bedtime. 11/03/20  Yes Orma Flaming, MD  magnesium 30 MG tablet Take 90 mg by mouth daily.   Yes [provider]  metroNIDAZOLE (METROCREAM) 0.75 % cream Apply 1 application topically 2 (two) times daily. 06/12/20  Yes [provider]  montelukast (SINGULAIR) 10 MG tablet TAKE ONE TABLET BY MOUTH EVERY NIGHT AT BEDTIME Patient taking differently: Take 10 mg by mouth at bedtime. 11/03/20  Yes Orma Flaming, MD  Multiple Vitamins-Minerals (PRESERVISION AREDS PO) Take 1 tablet by mouth daily.   Yes [provider]  nitroGLYCERIN (NITROSTAT) 0.4 MG SL tablet Place under the tongue every 5 (five) minutes as needed for chest pain. 03/16/18  Yes [provider]  Nutritional Supplements (JUICE PLUS FIBRE PO) Take 1 Dose by mouth daily.   Yes [provider]  omeprazole (PRILOSEC) 40 MG capsule TAKE ONE CAPSULE BY MOUTH DAILY 30 MINUTES BEFORE BREAKFAST Patient taking differently: Take 40 mg by mouth daily as needed (heartburn). 11/03/20  Yes Orma Flaming, MD  Potassium 99 MG TABS Take 99 mg by mouth daily.   Yes [provider]  QUERCETIN PO Take 1 tablet by mouth daily.   Yes [provider]  Spacer/Aero-Holding Chambers (PRO COMFORT SPACER ADULT) MISC 1 Device by Does not apply route as needed. 03/07/20  Yes Orma Flaming, MD  SUPER B COMPLEX/C PO Take 1 tablet  by mouth daily.   Yes [provider]  tacrolimus (PROTOPIC) 0.1 % ointment Apply 1 application topically as needed (rash). 08/04/20  Yes [provider]  traMADol-acetaminophen (ULTRACET) 37.5-325 MG tablet Take 1 tablet by mouth every 8 (eight) hours as needed. Patient taking differently: Take 1 tablet by mouth every 8 (eight) hours as needed for moderate pain. 03/07/20  Yes Orma Flaming, MD    Allergies    Meperidine, Simvastatin, Oxycodone-acetaminophen, Prolia [denosumab], and Vicodin [hydrocodone-acetaminophen]  Review of Systems   Review of Systems  Constitutional: Negative for diaphoresis and fever.  Respiratory: Negative for cough and shortness of breath.   Cardiovascular: Positive for chest pain. Negative for leg swelling.  Gastrointestinal: Negative for abdominal pain, nausea and vomiting.  Genitourinary: Negative for dysuria.  All other systems reviewed and are negative.   Physical Exam Updated Vital Signs BP 133/73   Pulse 88   Temp 97.7 F (36.5 C) (Oral)   Resp 19   Ht 1.702 m (5\' 7" )   Wt 90 kg   SpO2 96%   BMI 31.08 kg/m   Physical Exam Vitals and  nursing note reviewed.  Constitutional:      Appearance: She is well-developed. She is obese. She is not ill-appearing.  HENT:     Head: Normocephalic and atraumatic.  Eyes:     Pupils: Pupils are equal, round, and reactive to light.  Cardiovascular:     Rate and Rhythm: Normal rate and regular rhythm.     Heart sounds: Normal heart sounds.  Pulmonary:     Effort: Pulmonary effort is normal. No respiratory distress.     Breath sounds: No wheezing.  Chest:     Chest wall: Tenderness present.     Comments: Point tenderness over left chest Abdominal:     General: Bowel sounds are normal.     Palpations: Abdomen is soft.  Musculoskeletal:     Cervical back: Neck supple.     Right lower leg: No tenderness. No edema.     Left lower leg: No tenderness. No edema.  Skin:    General: Skin is warm and dry.     Findings: No rash.  Neurological:     Mental Status: She is alert and oriented to person, place, and time.  Psychiatric:        Mood and Affect: Mood normal.     ED Results / Procedures / Treatments   Labs (all labs ordered are listed, but only abnormal results are displayed) Labs Reviewed  BASIC METABOLIC PANEL - Abnormal; Notable for the following components:      Result Value   Glucose, Bld 133 (*)    All other components within normal limits  CBC - Abnormal; Notable for the following components:   WBC 13.5 (*)    All other components within normal limits  PROTIME-INR  D-DIMER, QUANTITATIVE  TROPONIN I (HIGH SENSITIVITY)  TROPONIN I (HIGH SENSITIVITY)    EKG EKG Interpretation  Date/Time:  Friday November 14 2020 23:13:41 EDT Ventricular Rate:  95 PR Interval:  140 QRS Duration: 142 QT Interval:  406 QTC Calculation: 510 R Axis:   -48 Text Interpretation: Normal sinus rhythm Right bundle branch block Left anterior fascicular block Bifascicular block  Abnormal ECG NO prior for comparison Confirmed by Thayer Jew [phone]) on 11/15/2020 1:33:52  AM   Radiology DG Chest 2 View  Result Date: 11/14/2020 CLINICAL DATA:  Chest pain EXAM: CHEST - 2 VIEW COMPARISON:  01/31/2020 FINDINGS: Frontal and lateral  views of the chest demonstrate an unremarkable cardiac silhouette. No airspace disease, effusion, or pneumothorax. No acute bony abnormalities. IMPRESSION: 1. Stable chest, no acute process. Electronically Signed   By: Randa Ngo M.D.   On: 11/14/2020 23:36    Procedures Procedures   Medications Ordered in ED Medications  aspirin chewable tablet 162 mg (162 mg Oral Given 11/15/20 0032)    ED Course  I have reviewed the triage vital signs and the nursing notes.  Pertinent labs & imaging results that were available during my care of the patient were reviewed by me and considered in my medical decision making (see chart for details).    MDM Rules/Calculators/A&P HEAR Score: 4                        Patient presents with chest pain.  Ongoing since 7 PM.  She has overall nontoxic-appearing and vital signs are reassuring.  She is a heart score of 4.  Pain is somewhat atypical and well localized on exam although she certainly has some risk factors.  EKG shows a bundle branch block but no ST elevation or evidence of acute ischemia.  I do not have a prior for comparison.  Patient indicates that she may have had a "abnormal EKG" in the past.  I am unable to see any prior EKGs.  ACS, PE, pneumonia, pneumothorax, musculoskeletal etiology are all considerations.  Patient reports that this pain is different than her hiatal hernia.  Chest x-ray shows no evidence of pneumothorax or pneumonia.  Screening D-dimer for PE was negative.  Patient is otherwise low risk.  Initial troponin 4.  Repeat troponin stable at 4.  I had a long discussion with patient and her spouse.  Her pain is fairly atypical and is somewhat reproducible on exam.  Although she has some risk factors, feel is reasonable that she follow-up with cardiology as an outpatient for  definitive functional testing.  Patient is agreeable to plan.  She was given strict return precautions.  After history, exam, and medical workup I feel the patient has been appropriately medically screened and is safe for discharge home. Pertinent diagnoses were discussed with the patient. Patient was given return precautions.  Final Clinical Impression(s) / ED Diagnoses Final diagnoses:  Atypical chest pain    Rx / DC Orders ED Discharge Orders    None       Camey Edell, Barbette Hair, MD 11/15/20 [phone]

## 2020-11-27 ENCOUNTER — Encounter: Payer: Self-pay | Admitting: Family Medicine

## 2020-11-27 ENCOUNTER — Ambulatory Visit (INDEPENDENT_AMBULATORY_CARE_PROVIDER_SITE_OTHER): Payer: Medicare HMO | Admitting: Family Medicine

## 2020-11-27 ENCOUNTER — Other Ambulatory Visit: Payer: Self-pay

## 2020-11-27 VITALS — BP 148/80 | HR 91 | Temp 98.5°F | Ht 67.0 in | Wt 183.0 lb

## 2020-11-27 DIAGNOSIS — S82209A Unspecified fracture of shaft of unspecified tibia, initial encounter for closed fracture: Secondary | ICD-10-CM | POA: Insufficient documentation

## 2020-11-27 DIAGNOSIS — M5430 Sciatica, unspecified side: Secondary | ICD-10-CM | POA: Insufficient documentation

## 2020-11-27 DIAGNOSIS — R0789 Other chest pain: Secondary | ICD-10-CM

## 2020-11-27 DIAGNOSIS — S82409A Unspecified fracture of shaft of unspecified fibula, initial encounter for closed fracture: Secondary | ICD-10-CM | POA: Insufficient documentation

## 2020-11-27 DIAGNOSIS — E8940 Asymptomatic postprocedural ovarian failure: Secondary | ICD-10-CM | POA: Insufficient documentation

## 2020-11-27 DIAGNOSIS — M8580 Other specified disorders of bone density and structure, unspecified site: Secondary | ICD-10-CM | POA: Insufficient documentation

## 2020-11-27 DIAGNOSIS — M549 Dorsalgia, unspecified: Secondary | ICD-10-CM | POA: Insufficient documentation

## 2020-11-27 MED ORDER — HYDROXYZINE HCL 25 MG PO TABS: ORAL_TABLET | ORAL | 1 refills | Status: DC

## 2020-11-27 MED ORDER — CYCLOBENZAPRINE HCL 10 MG PO TABS: ORAL_TABLET | ORAL | 3 refills | Status: DC

## 2020-11-27 MED ORDER — AZELASTINE HCL 0.1 % NA SOLN: 1.0000 | Freq: Two times a day (BID) | NASAL | 5 refills | Status: DC | PRN

## 2020-11-27 MED ORDER — ALBUTEROL SULFATE HFA 108 (90 BASE) MCG/ACT IN AERS: 2.0000 | INHALATION_SPRAY | Freq: Four times a day (QID) | RESPIRATORY_TRACT | 1 refills | Status: DC | PRN

## 2020-11-27 MED ORDER — MONTELUKAST SODIUM 10 MG PO TABS: 10.0000 mg | ORAL_TABLET | Freq: Every day | ORAL | 3 refills | Status: DC

## 2020-11-27 MED ORDER — NITROGLYCERIN 0.4 MG SL SUBL: 0.4000 mg | SUBLINGUAL_TABLET | SUBLINGUAL | 1 refills | Status: DC | PRN

## 2020-11-27 NOTE — Patient Instructions (Signed)
-atypical chest pain. I wonder if you have chostochondritis if it hurt when you pressed on your chest.   -try voltaren gel (it's a topical anti inflammatory) so safe for your belly. Can use on knee too!    Costochondritis  Costochondritis is irritation and swelling (inflammation) of the tissue that connects the ribs to the breastbone (sternum). This tissue is called cartilage. Costochondritis causes pain in the front of the chest. Usually, the pain:  Starts slowly.  Is in more than one rib. What are the causes? The exact cause of this condition is not always known. It results from stress on the tissue in the affected area. The cause of this stress could be:  Chest injury.  Exercise or activity, such as lifting.  Very bad coughing. What increases the risk? You are more likely to develop this condition if you:  Are female.  Are 45-3 years old.  Recently started a new exercise or work activity.  Have low levels of vitamin D.  Have a condition that makes you cough often. What are the signs or symptoms? The main symptom of this condition is chest pain. The pain:  Usually starts slowly and can be sharp or dull.  Gets worse with deep breathing, coughing, or exercise.  Gets better with rest.  May be worse when you press on the affected area of your ribs and breastbone. How is this treated? This condition usually goes away on its own over time. Your doctor may prescribe an NSAID, such as ibuprofen. This can help reduce pain and inflammation. Treatment may also include:  Resting and avoiding activities that make pain worse.  Putting heat or ice on the painful area.  Doing exercises to stretch your chest muscles. If these treatments do not help, your doctor may inject a numbing medicine to help relieve the pain. Follow these instructions at home: Managing pain, stiffness, and swelling  If told, put ice on the painful area. To do this: ? Put ice in a plastic bag. ? Place  a towel between your skin and the bag. ? Leave the ice on for 20 minutes, 2-3 times a day.  If told, put heat on the affected area. Do this as often as told by your doctor. Use the heat source that your doctor recommends, such as a moist heat pack or a heating pad. ? Place a towel between your skin and the heat source. ? Leave the heat on for 20-30 minutes. ? Take off the heat if your skin turns bright red. This is very important if you cannot feel pain, heat, or cold. You may have a greater risk of getting burned.      Activity  Rest as told by your doctor.  Do not do anything that makes your pain worse. This includes any activities that use chest, belly (abdomen), and side muscles.  Do not lift anything that is heavier than 10 lb (4.5 kg), or the limit that you are told, until your doctor says that it is safe.  Return to your normal activities as told by your doctor. Ask your doctor what activities are safe for you. General instructions  Take over-the-counter and prescription medicines only as told by your doctor.  Keep all follow-up visits as told by your doctor. This is important. Contact a doctor if:  You have chills or a fever.  Your pain does not go away or it gets worse.  You have a cough that does not go away. Get help right away  if:  You are short of breath.  You have very bad chest pain that is not helped by medicines, heat, or ice. These symptoms may be an emergency. Do not wait to see if the symptoms will go away. Get medical help right away. Call your local emergency services (911 in the [place] Do not drive yourself to the hospital. Summary  Costochondritis is irritation and swelling (inflammation) of the tissue that connects the ribs to the breastbone (sternum).  This condition causes pain in the front of the chest.  Treatment may include medicines, rest, heat or ice, and exercises. This information is not intended to replace advice given to you by your  health care provider. Make sure you discuss any questions you have with your health care provider. Document Revised: 06/01/2019 Document Reviewed: 06/01/2019 Elsevier Patient Education  2021 [employer].

## 2020-11-27 NOTE — Progress Notes (Signed)
Patient: Tracy Mckenzie MRN: [id number] DOB: 1956-02-15 PCP: Orma Flaming, MD     Subjective:  Chief Complaint  Patient presents with  . Follow-up    Ed follow up for Chest pain. Pain has subsided. Pt will follow up in Cardiology.     HPI: The patient is a 65 y.o. female who presents today for ED follow up for chest pain. Pain has subsided. Pt will follow up in Cardiology. She was seen in ER on 11/14/20. She was having pain under her left breast and to the left of her breast. She has a strong family hx of heart disease and was scared to go to sleep and went to ER. She took 2 NG and this did not help at all. Pain was not crushing/heavy. It was constant. Did not travel anywhere. She had no associated short of breath, diaphoresis or other symptoms. No change or pain with exertion.  She had negative troponin x 2, normal d-dimer, normal CXR, RBBB on ekg (chronic) and possible LAFB, not seen on 11/21 ekg. She has had no other chest pain since that time. Has appointment cardiology in less than 2 weeks time.   She is also needing refills of some of her medication.   Review of Systems  Constitutional: Positive for fatigue. Negative for chills and fever.  HENT: Negative for dental problem, ear pain, hearing loss and trouble swallowing.   Eyes: Negative for visual disturbance.  Respiratory: Negative for cough, chest tightness and shortness of breath.   Cardiovascular: Negative for chest pain, palpitations and leg swelling.  Gastrointestinal: Negative for abdominal pain, blood in stool, diarrhea and nausea.  Endocrine: Negative for cold intolerance, polydipsia, polyphagia and polyuria.  Genitourinary: Negative for dysuria and hematuria.  Musculoskeletal: Negative for arthralgias.  Skin: Negative for rash.  Neurological: Negative for dizziness and headaches.  Psychiatric/Behavioral: Negative for dysphoric mood and sleep disturbance. The patient is not nervous/anxious.     Allergies Patient is  allergic to meperidine, simvastatin, oxycodone-acetaminophen, prolia [denosumab], and vicodin [hydrocodone-acetaminophen].  Past Medical History Patient  has a past medical history of Arthritis, Bleeding ulcer, Elevated cholesterol, Hiatal hernia, High blood pressure, Kidney cysts, Liver cyst, Nerve pain, and Peptic ulcer disease.  Surgical History Patient  has a past surgical history that includes Appendectomy (1992); Tonsillectomy (1967); laparoscopy (1981); Total abdominal hysterectomy (1992); Tibia fracture surgery (Right, 06/12/2000); Leg Surgery (Right, 11/23/2000); Knee arthroscopy (Right, 04/12/2001); Leg Surgery (Right, 02/08/2002); Meniscus repair (01/29/2003); and Breast cyst aspiration (Left, 2003).  Family History Pateint's family history includes Breast cancer in her maternal aunt and paternal aunt; COPD in her father and mother; Diabetes in her maternal grandfather; Kidney disease in her mother; Stroke in her paternal grandfather.  Social History Patient  reports that she has never smoked. She has never used smokeless tobacco. She reports that she does not drink alcohol and does not use drugs.    Objective: Vitals:   11/27/20 1023  BP: (!) 148/80  Pulse: 91  Temp: 98.5 F (36.9 C)  TempSrc: Temporal  SpO2: 96%  Weight: 183 lb (83 kg)  Height: 5\' 7"  (1.702 m)    Body mass index is 28.66 kg/m.  Physical Exam Vitals reviewed.  Constitutional:      Appearance: Normal appearance. She is well-developed and normal weight.  HENT:     Head: Normocephalic and atraumatic.     Right Ear: External ear normal.     Left Ear: External ear normal.  Neck:     Thyroid: No thyromegaly.  Vascular: No carotid bruit.  Cardiovascular:     Rate and Rhythm: Normal rate and regular rhythm.     Pulses: Normal pulses.     Heart sounds: Normal heart sounds. No murmur heard.   Pulmonary:     Effort: Pulmonary effort is normal.     Breath sounds: Normal breath sounds.  Abdominal:      General: Abdomen is flat. Bowel sounds are normal. There is no distension.     Palpations: Abdomen is soft.     Tenderness: There is no abdominal tenderness.  Musculoskeletal:        General: Tenderness (over anterior chest wall along sternum) present.     Cervical back: Normal range of motion and neck supple.  Lymphadenopathy:     Cervical: No cervical adenopathy.  Skin:    General: Skin is warm and dry.     Capillary Refill: Capillary refill takes less than 2 seconds.     Findings: No rash.  Neurological:     General: No focal deficit present.     Mental Status: She is alert and oriented to person, place, and time.     Cranial Nerves: No cranial nerve deficit.     Coordination: Coordination normal.     Deep Tendon Reflexes: Reflexes normal.  Psychiatric:        Mood and Affect: Mood normal.        Behavior: Behavior normal.    Cxr/ekg/labs reviewed.     Assessment/plan: 1. Atypical chest pain ER records reviewed and wnl. Already has f/u with cardiology. Precautions given.  She does have some reproducible pain on exam and discussed costochondritis.  Precautions given.   Refills given as needed.      Return if symptoms worsen or fail to improve.   Orma Flaming, MD [place]   11/27/2020

## 2020-12-09 ENCOUNTER — Other Ambulatory Visit: Payer: Self-pay

## 2020-12-09 ENCOUNTER — Ambulatory Visit: Payer: Medicare HMO | Admitting: Internal Medicine

## 2020-12-09 ENCOUNTER — Ambulatory Visit (INDEPENDENT_AMBULATORY_CARE_PROVIDER_SITE_OTHER)
Admission: RE | Admit: 2020-12-09 | Discharge: 2020-12-09 | Disposition: A | Discharge status: Home / Self Care | Payer: Self-pay | Source: Ambulatory Visit | Attending: Internal Medicine | Admitting: Internal Medicine

## 2020-12-09 VITALS — BP 110/68 | HR 92 | Ht 67.0 in | Wt 183.4 lb

## 2020-12-09 DIAGNOSIS — R079 Chest pain, unspecified: Secondary | ICD-10-CM | POA: Diagnosis not present

## 2020-12-09 DIAGNOSIS — R0789 Other chest pain: Secondary | ICD-10-CM

## 2020-12-09 NOTE — Patient Instructions (Signed)
Medication Instructions:  No changes *If you need a refill on your cardiac medications before your next appointment, please call your pharmacy*   Lab Work: none If you have labs (blood work) drawn today and your tests are completely normal, you will receive your results only by: Marland Kitchen MyChart Message (if you have MyChart) OR . A paper copy in the mail If you have any lab test that is abnormal or we need to change your treatment, we will call you to review the results.   Testing/Procedures: Calcium score CT scan --out of pocket charge $99.  Ask if can be billed for this.  Follow-Up: Your physician recommends that you schedule a follow-up appointment as needed w Dr. Harrington Challenger.

## 2020-12-09 NOTE — Progress Notes (Signed)
Cardiology Office Note   Date:  12/09/2020   ID:  Tracy Mckenzie, DOB 1955/10/13, MRN [id number]  PCP:  Orma Flaming, MD  Cardiologist:   Dorris Carnes, MD    Patient referred for CP      History of Present Illness: Tracy Mckenzie is a 65 y.o. female with a history of HTN and HL SHe had a stress echo at [facility] in 2011   Reported negative   The pt was seen in the [facility] ED on 4/16 for evla of CP    Pain under L breast and L parasternal   Scared   FHx +   Pain was not sharp or dull   Lasted 3 hours   Not positional   She does get some SOB with activity  She attributes to not being very active   No change Sine ER visit she has had no further spellls of CP     She has not been very active    Pt not very active overall she says   LImited by leg   Injured in past  5 surgeriers   Still with signif DJD     Current Meds  Medication Sig  . albuterol (VENTOLIN HFA) 108 (90 Base) MCG/ACT inhaler Inhale 2 puffs into the lungs every 6 (six) hours as needed for wheezing or shortness of breath.  Marland Kitchen azelastine (ASTELIN) 0.1 % nasal spray Place 1 spray into both nostrils 2 (two) times daily as needed for rhinitis or allergies.  . Cholecalciferol (VITAMIN D3 PO) Take 10,000 Units by mouth daily.  . COCONUT OIL PO Take by mouth.  . Coenzyme Q10 (CO Q-10) 100 MG CAPS Take 100 mg by mouth daily.  . cyclobenzaprine (FLEXERIL) 10 MG tablet Take 1/2-1 tablet as needed for muscle spasms BID  . fluticasone (CUTIVATE) 0.05 % cream Apply 1 application topically as needed (rash).  . hydrochlorothiazide (MICROZIDE) 12.5 MG capsule Take 1 capsule (12.5 mg total) by mouth daily.  . hydrOXYzine (ATARAX/VISTARIL) 25 MG tablet TAKE 1 TABLET BY MOUTH THREE TIMES A DAY AS NEEDED FOR ITCHING  . levocetirizine (XYZAL) 5 MG tablet Take 5 mg by mouth 2 (two) times daily.  Marland Kitchen losartan (COZAAR) 50 MG tablet TAKE ONE TABLET BY MOUTH TWICE A DAY FOR FOR BLOOD PRESSURE (Patient taking differently: Take 50 mg by mouth in the  morning and at bedtime.)  . magnesium 30 MG tablet Take 90 mg by mouth daily.  . metroNIDAZOLE (METROCREAM) 0.75 % cream Apply 1 application topically 2 (two) times daily.  . montelukast (SINGULAIR) 10 MG tablet Take 1 tablet (10 mg total) by mouth at bedtime.  . Multiple Vitamins-Minerals (PRESERVISION AREDS PO) Take 1 tablet by mouth daily.  . nitroGLYCERIN (NITROSTAT) 0.4 MG SL tablet Place 1 tablet (0.4 mg total) under the tongue every 5 (five) minutes as needed for chest pain.  Marland Kitchen omeprazole (PRILOSEC) 40 MG capsule TAKE ONE CAPSULE BY MOUTH DAILY 30 MINUTES BEFORE BREAKFAST (Patient taking differently: Take 40 mg by mouth daily as needed (heartburn).)  . Potassium 99 MG TABS Take 99 mg by mouth daily.  Marland Kitchen QUERCETIN PO Take 1 tablet by mouth daily.  Marland Kitchen Spacer/Aero-Holding Chambers (PRO COMFORT SPACER ADULT) MISC 1 Device by Does not apply route as needed.  . SUPER B COMPLEX/C PO Take 1 tablet by mouth daily.  . tacrolimus (PROTOPIC) 0.1 % ointment Apply 1 application topically as needed (rash).  . traMADol-acetaminophen (ULTRACET) 37.5-325 MG tablet Take 1 tablet by mouth every  8 (eight) hours as needed. (Patient taking differently: Take 1 tablet by mouth every 8 (eight) hours as needed for moderate pain.)  . zinc gluconate 50 MG tablet Take 50 mg by mouth daily.   Current Facility-Administered Medications for the 12/09/20 encounter (Office Visit) with Fay Records, MD  Medication  . 0.9 %  sodium chloride infusion     Allergies:   Meperidine, Simvastatin, Oxycodone-acetaminophen, Prolia [denosumab], and Vicodin [hydrocodone-acetaminophen]   Past Medical History:  Diagnosis Date  . Arthritis   . Bleeding ulcer   . Elevated cholesterol   . Hiatal hernia   . High blood pressure   . Kidney cysts   . Liver cyst   . Nerve pain    L sciatic pain radiating to R  . Peptic ulcer disease     Past Surgical History:  Procedure Laterality Date  . APPENDECTOMY  1992  . BREAST CYST  ASPIRATION Left 2003  . KNEE ARTHROSCOPY Right 04/12/2001   torn cartilage  . LAPAROSCOPY  1981   for endometriosis  . LEG SURGERY Right 11/23/2000   to remove screws  . LEG SURGERY Right 02/08/2002   to remove rod  . MENISCUS REPAIR  01/29/2003  . TIBIA FRACTURE SURGERY Right 06/12/2000   Multiple surgeries  . TONSILLECTOMY  1967  . TOTAL ABDOMINAL HYSTERECTOMY  1992     Social History:  The patient  reports that she has never smoked. She has never used smokeless tobacco. She reports that she does not drink alcohol and does not use drugs.   Family History:  The patient's family history includes Breast cancer in her maternal aunt and paternal aunt; COPD in her father and mother; Diabetes in her maternal grandfather; Kidney disease in her mother; Stroke in her paternal grandfather.    ROS:  Please see the history of present illness. All other systems are reviewed and  Negative to the above problem except as noted.    PHYSICAL EXAM: VS:  BP 110/68   Pulse 92   Ht 5\' 7"  (1.702 m)   Wt 183 lb 6.4 oz (83.2 kg)   SpO2 95%   BMI 28.72 kg/m   GEN: Well nourished, well developed, in no acute distress  HEENT: normal  Neck: no JVD, carotid bruits Cardiac: RRR; no murmurs  No LE edema  Chest  Nontender to palpation Respiratory:  clear to auscultation bilaterally GI: soft, nontender, nondistended, + BS  No hepatomegaly  MS: no deformity Moving all extremities   Skin: warm and dry, no rash Neuro:  Strength and sensation are intact Psych: euthymic mood, full affect   EKG:  EKG is ordered today.  In April SR RBBB   LAFB     Lipid Panel    Component Value Date/Time   CHOL 235 (H) 09/11/2020 0904   TRIG 72.0 09/11/2020 0904   HDL 62.10 09/11/2020 0904   CHOLHDL 4 09/11/2020 0904   VLDL 14.4 09/11/2020 0904   LDLCALC 159 (H) 09/11/2020 0904      Wt Readings from Last 3 Encounters:  12/09/20 183 lb 6.4 oz (83.2 kg)  11/27/20 183 lb (83 kg)  11/14/20 198 lb 6.6 oz (90 kg)       ASSESSMENT AND PLAN: 1  Chest pain   Atypical for caridac   Has not had since ED visit   She does however have some DOE     ? If related to deconditioning or something else FHx of cardiac problems  Will set up  for Ca score CT to see if she has CAD or not   Follow up based on this  2  LIpids   LDL high at 159   Goal for lipids will be based on CT findings  F/U will be based on results        Current medicines are reviewed at length with the patient today.  The patient does not have concerns regarding medicines.  Signed, Dorris Carnes, MD  12/09/2020 10:55 AM    [address], [place], [place]  [address] Phone: [phone]; Fax: [phone]

## 2021-01-02 IMAGING — DX DG CHEST 2V
2 series · 2 of 2 positions shown · non-contrast
Comparison: None.

CLINICAL DATA: Chronic cough

EXAM:
CHEST - 2 VIEW

[chest pa]
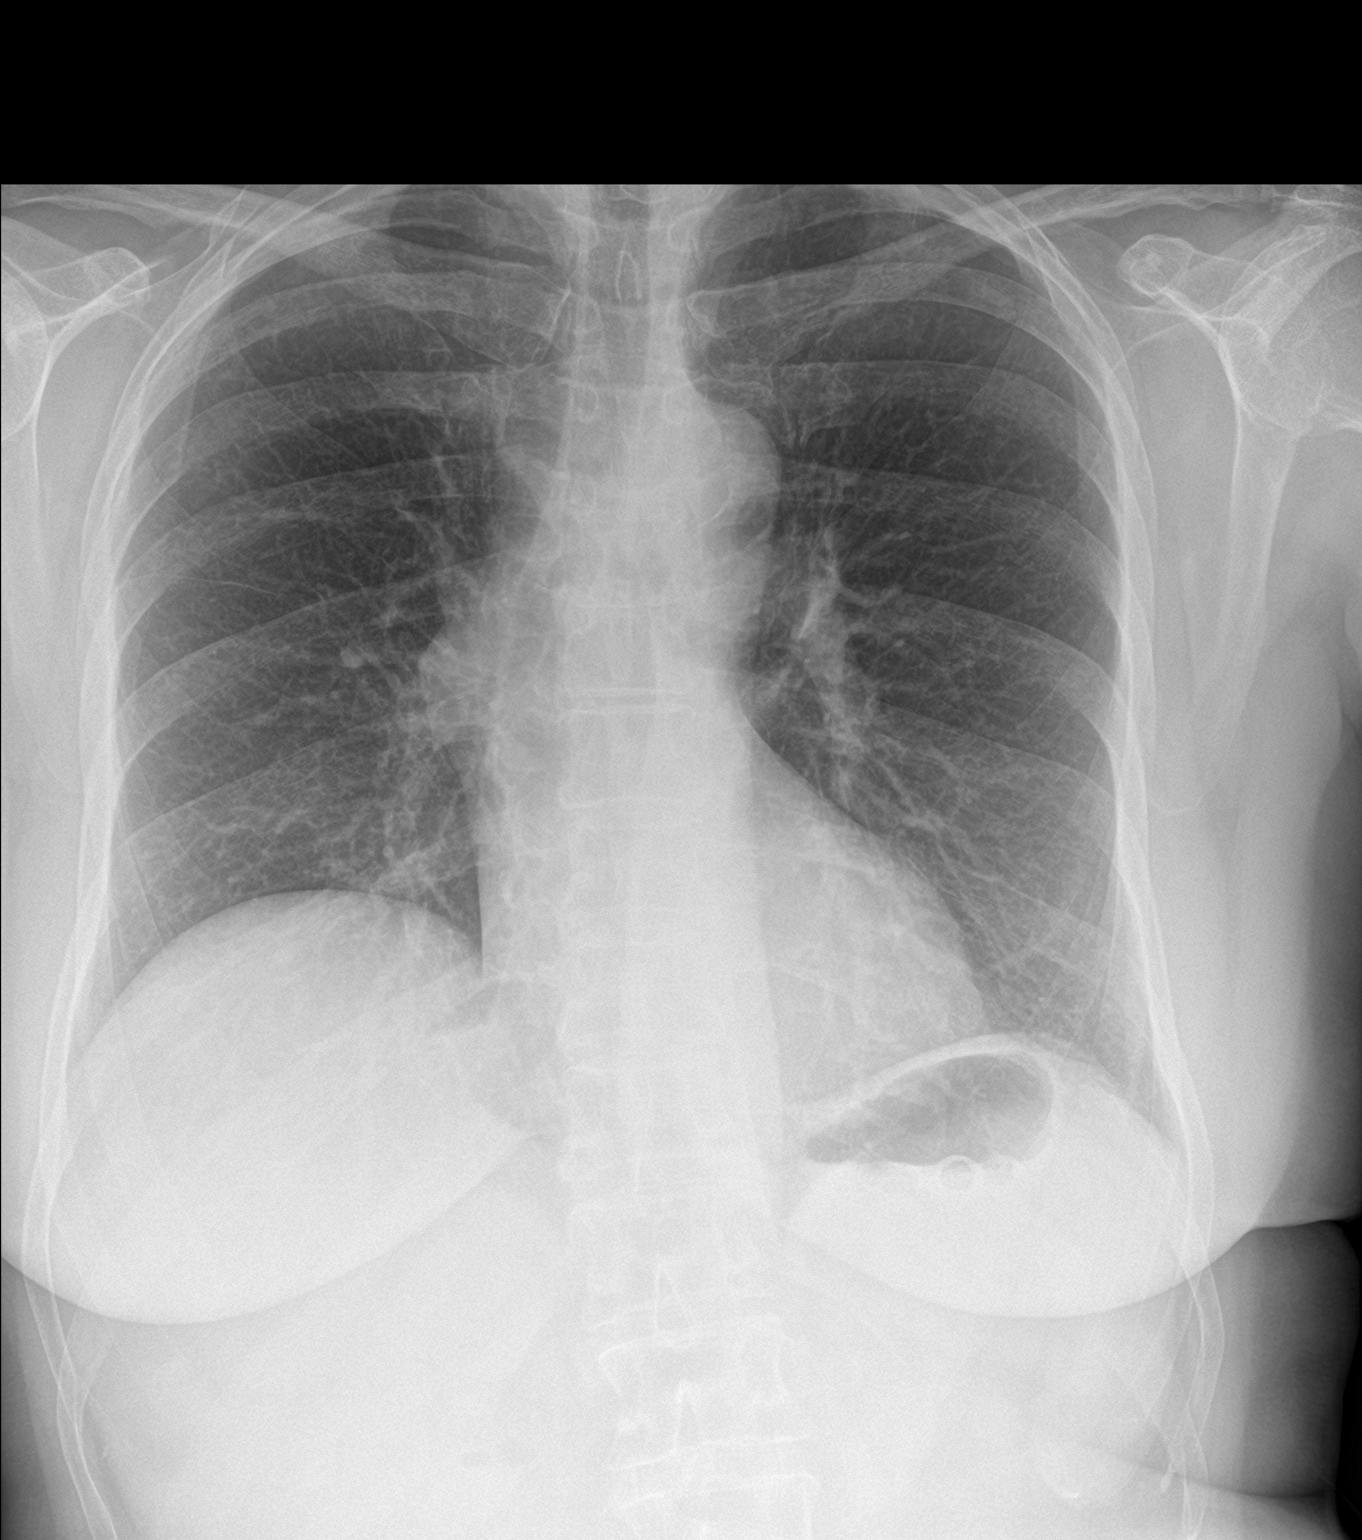

[chest lat]
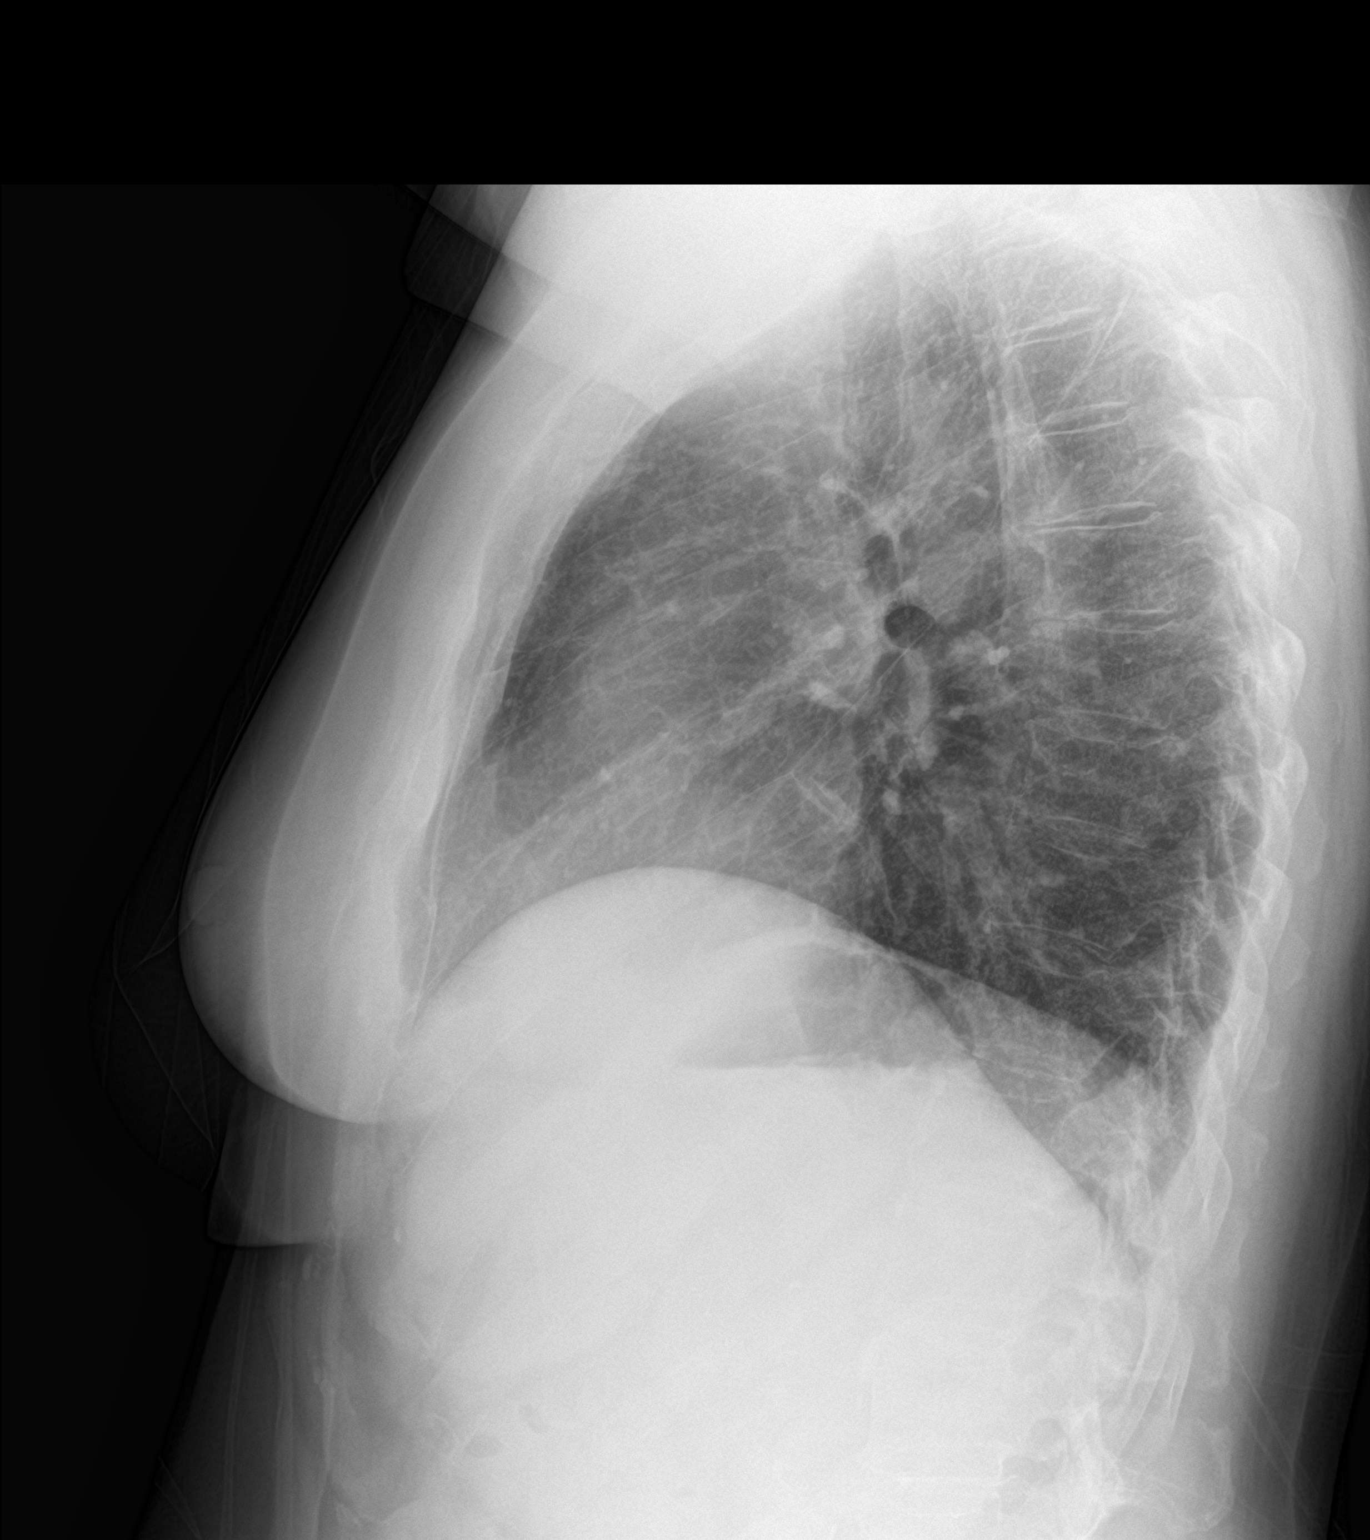

[2 of 2 positions shown; findings below may reference images not displayed]

FINDINGS: Mild elevation right hemidiaphragm. Lungs are well aerated and
clear. No infiltrate effusion or mass. Heart size and vascularity
normal. No acute skeletal abnormality.
IMPRESSION: No active cardiopulmonary disease.

## 2021-01-27 ENCOUNTER — Other Ambulatory Visit: Payer: Self-pay | Admitting: Family Medicine

## 2021-02-05 NOTE — Telephone Encounter (Signed)
Records shredded since pt did not call back in longer than a year.

## 2021-03-04 ENCOUNTER — Ambulatory Visit: Payer: Medicare HMO | Admitting: Family Medicine

## 2021-03-05 ENCOUNTER — Telehealth: Payer: Self-pay

## 2021-03-05 NOTE — Chronic Care Management (AMB) (Signed)
  Chronic Care Management   Note  03/05/2021 Name: Tracy Mckenzie MRN: [id number] DOB: 11-27-1955  Tracy Mckenzie is a 65 y.o. year old female who is a primary care patient of Orma Flaming, MD. I reached out to Glee Arvin by phone today in response to a referral sent by Ms. Lora Paula PCP, Orma Flaming, MD      Ms. Aloi was given information about Chronic Care Management services today including:  CCM service includes personalized support from designated clinical staff supervised by her physician, including individualized plan of care and coordination with other care providers 24/7 contact phone numbers for assistance for urgent and routine care needs. Service will only be billed when office clinical staff spend 20 minutes or more in a month to coordinate care. Only one practitioner may furnish and bill the service in a calendar month. The patient may stop CCM services at any time (effective at the end of the month) by phone call to the office staff. The patient will be responsible for cost sharing (co-pay) of up to 20% of the service fee (after annual deductible is met).  Patient agreed to services and verbal consent obtained.   Follow up plan: Telephone appointment with care management team member scheduled for:04/02/2021  Noreene Larsson, [address], [place], [place] [address] Direct Dial: [phone] [email or web address]

## 2021-03-11 ENCOUNTER — Ambulatory Visit: Payer: Medicare HMO | Admitting: Family Medicine

## 2021-03-16 ENCOUNTER — Encounter: Payer: Medicare HMO | Admitting: Physician Assistant

## 2021-04-02 ENCOUNTER — Ambulatory Visit (INDEPENDENT_AMBULATORY_CARE_PROVIDER_SITE_OTHER): Payer: Medicare HMO | Admitting: *Deleted

## 2021-04-02 DIAGNOSIS — E782 Mixed hyperlipidemia: Secondary | ICD-10-CM | POA: Diagnosis not present

## 2021-04-02 DIAGNOSIS — I10 Essential (primary) hypertension: Secondary | ICD-10-CM

## 2021-04-02 NOTE — Patient Instructions (Signed)
Visit Information   PATIENT GOALS:   Goals Addressed             This Visit's Progress    (RNCM)  Track and Manage My Blood Pressure-Hypertension       Timeframe:  Long-Range Goal Priority:  Medium Start Date:   04/02/21                          Expected End Date: 10/30/21                      Follow Up Date 05/07/2022    Check blood pressure 3 times per week Write blood pressure results in a log and take to provider for review Low salt low fat low cholesterol diet Increase activity as tolerated  Why is this important?   You won't feel high blood pressure, but it can still hurt your blood vessels.  High blood pressure can cause heart or kidney problems. It can also cause a stroke.  Making lifestyle changes like losing a little weight or eating less salt will help.  Checking your blood pressure at home and at different times of the day can help to control blood pressure.  If the doctor prescribes medicine remember to take it the way the doctor ordered.  Call the office if you cannot afford the medicine or if there are questions about it.     Notes:         Consent to CCM Services: Ms. Rutten was given information about Chronic Care Management services including:  CCM service includes personalized support from designated clinical staff supervised by her physician, including individualized plan of care and coordination with other care providers 24/7 contact phone numbers for assistance for urgent and routine care needs. Service will only be billed when office clinical staff spend 20 minutes or more in a month to coordinate care. Only one practitioner may furnish and bill the service in a calendar month. The patient may stop CCM services at any time (effective at the end of the month) by phone call to the office staff. The patient will be responsible for cost sharing (co-pay) of up to 20% of the service fee (after annual deductible is met).  Patient agreed to services and  verbal consent obtained.   Patient verbalizes understanding of instructions provided today and agrees to view in [place].   The care management team will reach out to the patient again over the next 45 business days.   Hubert Azure RN, MSN RN Care Management Coordinator  [place] [email or web address]   CLINICAL CARE PLAN: Patient Care Plan: CCM RNCM Care Plan     Problem Identified: Knowledge deficit related to self care management of hypertension and hyperlipidemia   Priority: Medium     Goal: Patient will work with the CCM team to better manager hypertension and hyperlipidemia   Start Date: 04/02/2021  Expected End Date: 10/30/2021  Priority: Medium  Note:   Current Barriers:  Knowledge Deficits related to plan of care for management of HTN and HLD  patient reporting normally checking blood pressures a few times a week with normal ranges of 140/70's.  Has not been checking regularly due to recent increases related to over the counter cold medications.  Acknowledges allergies to statins and trying to manage cholesterol numbers with diet and natural medications  RNCM Clinical Goal(s):  Patient will verbalize understanding of plan for management of HTN and  HLD continue to work with RN Care Manager to address care management and care coordination needs related to HTN and HLD  through collaboration with RN Care manager, provider, and care team.   Interventions: 1:1 collaboration with primary care provider regarding development and update of comprehensive plan of care as evidenced by provider attestation and co-signature Inter-disciplinary care team collaboration (see longitudinal plan of care) Evaluation of current treatment plan related to  self management and patient's adherence to plan as established by provider   Hyperlipidemia:  (Status: New goal.) Lab Results  Component Value Date   CHOL 235 (H) 09/11/2020   HDL 62.10  09/11/2020   LDLCALC 159 (H) 09/11/2020   TRIG 72.0 09/11/2020   CHOLHDL 4 09/11/2020     Medication review performed; medication list updated in electronic medical record.  Provider established cholesterol goals reviewed; Counseled on importance of regular laboratory monitoring as prescribed; Provided HLD educational materials; Screening for signs and symptoms of depression related to chronic disease state;  Assessed social determinant of health barriers;   Hypertension: (Status: New goal.) Last practice recorded BP readings:  BP Readings from Last 3 Encounters:  12/09/20 110/68  11/27/20 (!) 148/80  11/15/20 134/74  Most recent eGFR/CrCl: No results found for: EGFR  No components found for: CRCL  Evaluation of current treatment plan related to hypertension self management and patient's adherence to plan as established by provider;   Reviewed prescribed diet low salt low fat low cholesterol Reviewed medications with patient and discussed importance of compliance;  Discussed plans with patient for ongoing care management follow up and provided patient with direct contact information for care management team; Advised patient, providing education and rationale, to monitor blood pressure daily and record, calling PCP for findings outside established parameters;  Discussed complications of poorly controlled blood pressure such as heart disease, stroke, circulatory complications, vision complications, kidney impairment, sexual dysfunction;   Patient Goals/Self-Care Activities: Patient will self administer medications as prescribed Patient will attend all scheduled provider appointments Patient will call pharmacy for medication refills Patient will call provider office for new concerns or questions Check blood pressure 3 times per week Write blood pressure results in a log and take to provider for review Low salt low fat low cholesterol diet Increase activity as tolerated

## 2021-04-02 NOTE — Chronic Care Management (AMB) (Signed)
Chronic Care Management   CCM RN Visit Note  04/02/2021 Name: Tracy Mckenzie MRN: [id number] DOB: 13-Jan-1956  Subjective: Tracy Mckenzie is a 65 y.o. year old female who is a primary care patient of Pcp, No. The care management team was consulted for assistance with disease management and care coordination needs.    Engaged with patient by telephone for initial visit in response to provider referral for case management and/or care coordination services.   Consent to Services:  The patient was given the following information about Chronic Care Management services today, agreed to services, and gave verbal consent: 1. CCM service includes personalized support from designated clinical staff supervised by the primary care provider, including individualized plan of care and coordination with other care providers 2. 24/7 contact phone numbers for assistance for urgent and routine care needs. 3. Service will only be billed when office clinical staff spend 20 minutes or more in a month to coordinate care. 4. Only one practitioner may furnish and bill the service in a calendar month. 5.The patient may stop CCM services at any time (effective at the end of the month) by phone call to the office staff. 6. The patient will be responsible for cost sharing (co-pay) of up to 20% of the service fee (after annual deductible is met). Patient agreed to services and consent obtained.  Patient agreed to services and verbal consent obtained.   Assessment: Review of patient past medical history, allergies, medications, health status, including review of consultants reports, laboratory and other test data, was performed as part of comprehensive evaluation and provision of chronic care management services.   SDOH (Social Determinants of Health) assessments and interventions performed:  SDOH Interventions    Flowsheet Row Most Recent Value  SDOH Interventions   Food Insecurity Interventions Intervention Not Indicated   Financial Strain Interventions Intervention Not Indicated  Housing Interventions Intervention Not Indicated  Intimate Partner Violence Interventions Intervention Not Indicated  Transportation Interventions Intervention Not Indicated        CCM Care Plan  Allergies  Allergen Reactions   Meperidine Palpitations and Anaphylaxis    flushed   Simvastatin     Other reaction(s): Muscle Pain   Oxycodone-Acetaminophen Itching and Rash   Prolia [Denosumab] Rash   Vicodin [Hydrocodone-Acetaminophen] Itching    Outpatient Encounter Medications as of 04/02/2021  Medication Sig Note   albuterol (VENTOLIN HFA) 108 (90 Base) MCG/ACT inhaler Inhale 2 puffs into the lungs every 6 (six) hours as needed for wheezing or shortness of breath.    azelastine (ASTELIN) 0.1 % nasal spray Place 1 spray into both nostrils 2 (two) times daily as needed for rhinitis or allergies.    Cholecalciferol (VITAMIN D3 PO) Take 10,000 Units by mouth daily.    Coenzyme Q10 (CO Q-10) 100 MG CAPS Take 100 mg by mouth daily.    cyclobenzaprine (FLEXERIL) 10 MG tablet Take 1/2-1 tablet as needed for muscle spasms BID    fluticasone (CUTIVATE) 0.05 % cream Apply 1 application topically as needed (rash).    hydrochlorothiazide (MICROZIDE) 12.5 MG capsule Take 1 capsule (12.5 mg total) by mouth daily.    hydrOXYzine (ATARAX/VISTARIL) 25 MG tablet TAKE 1 TABLET BY MOUTH THREE TIMES A DAY AS NEEDED FOR ITCHING    levocetirizine (XYZAL) 5 MG tablet Take 5 mg by mouth 2 (two) times daily.    losartan (COZAAR) 50 MG tablet TAKE ONE TABLET BY MOUTH TWICE A DAY FOR FOR BLOOD PRESSURE (Patient taking differently: Take 50 mg by mouth in  the morning and at bedtime.)    magnesium 30 MG tablet Take 90 mg by mouth daily.    metroNIDAZOLE (METROCREAM) 0.75 % cream Apply 1 application topically 2 (two) times daily.    montelukast (SINGULAIR) 10 MG tablet Take 1 tablet (10 mg total) by mouth at bedtime.    Multiple Vitamins-Minerals  (PRESERVISION AREDS PO) Take 1 tablet by mouth daily.    nitroGLYCERIN (NITROSTAT) 0.4 MG SL tablet Place 1 tablet (0.4 mg total) under the tongue every 5 (five) minutes as needed for chest pain.    Potassium 99 MG TABS Take 99 mg by mouth daily.    QUERCETIN PO Take 1 tablet by mouth daily.    tacrolimus (PROTOPIC) 0.1 % ointment Apply 1 application topically as needed (rash).    traMADol-acetaminophen (ULTRACET) 37.5-325 MG tablet Take 1 tablet by mouth every 8 (eight) hours as needed. (Patient taking differently: Take 1 tablet by mouth every 8 (eight) hours as needed for moderate pain.)    zinc gluconate 50 MG tablet Take 50 mg by mouth daily.    COCONUT OIL PO Take by mouth. (Patient not taking: Reported on 04/02/2021) 04/02/2021: Need to refill   omeprazole (PRILOSEC) 40 MG capsule TAKE ONE CAPSULE BY MOUTH DAILY 30 MINUTES BEFORE BREAKFAST (Patient not taking: Reported on 04/02/2021)    Spacer/Aero-Holding Chambers (PRO COMFORT SPACER ADULT) MISC 1 Device by Does not apply route as needed.    SUPER B COMPLEX/C PO Take 1 tablet by mouth daily. (Patient not taking: Reported on 04/02/2021)    Facility-Administered Encounter Medications as of 04/02/2021  Medication   0.9 %  sodium chloride infusion    Patient Active Problem List   Diagnosis Date Noted   Back pain 11/27/2020   Osteopenia 11/27/2020   Sciatica 11/27/2020   Surgical menopause 11/27/2020   Tibia/fibula fracture 11/27/2020   Unilateral primary osteoarthritis, right knee 09/05/2019   Hyperlipidemia 08/15/2019   Essential hypertension 08/13/2019   GERD (gastroesophageal reflux disease) 08/13/2019   Osteoporosis 08/13/2019   Post-traumatic osteoarthritis of right knee 08/13/2019   Hiatal hernia 08/13/2019   Chronic left-sided low back pain without sciatica 08/13/2019    Conditions to be addressed/monitored:HTN and HLD  Care Plan : [place]  Updates made by Leona Singleton, RN since 04/02/2021 12:00 AM     Problem:  Knowledge deficit related to self care management of hypertension and hyperlipidemia   Priority: Medium     Goal: Patient will work with the CCM team to better manager hypertension and hyperlipidemia   Start Date: 04/02/2021  Expected End Date: 10/30/2021  Priority: Medium  Note:   Current Barriers:  Knowledge Deficits related to plan of care for management of HTN and HLD  patient reporting normally checking blood pressures a few times a week with normal ranges of 140/70's.  Has not been checking regularly due to recent increases related to over the counter cold medications.  Acknowledges allergies to statins and trying to manage cholesterol numbers with diet and natural medications  RNCM Clinical Goal(s):  Patient will verbalize understanding of plan for management of HTN and HLD continue to work with RN Care Manager to address care management and care coordination needs related to HTN and HLD  through collaboration with RN Care manager, provider, and care team.   Interventions: 1:1 collaboration with primary care provider regarding development and update of comprehensive plan of care as evidenced by provider attestation and co-signature Inter-disciplinary care team collaboration (see longitudinal plan of  care) Evaluation of current treatment plan related to  self management and patient's adherence to plan as established by provider   Hyperlipidemia:  (Status: New goal.) Lab Results  Component Value Date   CHOL 235 (H) 09/11/2020   HDL 62.10 09/11/2020   LDLCALC 159 (H) 09/11/2020   TRIG 72.0 09/11/2020   CHOLHDL 4 09/11/2020     Medication review performed; medication list updated in electronic medical record.  Provider established cholesterol goals reviewed; Counseled on importance of regular laboratory monitoring as prescribed; Provided HLD educational materials; Screening for signs and symptoms of depression related to chronic disease state;  Assessed social determinant of  health barriers;   Hypertension: (Status: New goal.) Last practice recorded BP readings:  BP Readings from Last 3 Encounters:  12/09/20 110/68  11/27/20 (!) 148/80  11/15/20 134/74  Most recent eGFR/CrCl: No results found for: EGFR  No components found for: CRCL  Evaluation of current treatment plan related to hypertension self management and patient's adherence to plan as established by provider;   Reviewed prescribed diet low salt low fat low cholesterol Reviewed medications with patient and discussed importance of compliance;  Discussed plans with patient for ongoing care management follow up and provided patient with direct contact information for care management team; Advised patient, providing education and rationale, to monitor blood pressure daily and record, calling PCP for findings outside established parameters;  Discussed complications of poorly controlled blood pressure such as heart disease, stroke, circulatory complications, vision complications, kidney impairment, sexual dysfunction;   Patient Goals/Self-Care Activities: Patient will self administer medications as prescribed Patient will attend all scheduled provider appointments Patient will call pharmacy for medication refills Patient will call provider office for new concerns or questions Check blood pressure 3 times per week Write blood pressure results in a log and take to provider for review Low salt low fat low cholesterol diet Increase activity as tolerated     Plan:The care management team will reach out to the patient again over the next 45 business days.  Hubert Azure RN, MSN RN Care Management Coordinator  [facility] [phone] [email or web address]

## 2021-04-08 ENCOUNTER — Encounter: Payer: Self-pay | Admitting: Orthopaedic Surgery

## 2021-04-08 ENCOUNTER — Ambulatory Visit: Payer: Medicare HMO | Admitting: Orthopaedic Surgery

## 2021-04-08 DIAGNOSIS — M1731 Unilateral post-traumatic osteoarthritis, right knee: Secondary | ICD-10-CM

## 2021-04-08 MED ORDER — LIDOCAINE HCL 1 % IJ SOLN
3.0000 mL | INTRAMUSCULAR | Status: AC | PRN
  Administered 2021-04-08: 3 mL

## 2021-04-08 MED ORDER — METHYLPREDNISOLONE ACETATE 40 MG/ML IJ SUSP
40.0000 mg | INTRAMUSCULAR | Status: AC | PRN
  Administered 2021-04-08: 40 mg via INTRA_ARTICULAR

## 2021-04-08 NOTE — Progress Notes (Signed)
   Procedure Note  Patient: Tracy Mckenzie             Date of Birth: 18-Jun-1956           MRN: [id number]             Visit Date: 04/08/2021  HPI: Patient is well-known to Dr. Trevor Mace service comes in today requesting right knee injection with cortisone.  She has had supplemental injections in the past that helped but actually initially made the knee pain worse on the lateral aspect.  She has known posttraumatic arthritis involving her left knee.  No retained hardware.  Lateral compartment knee with valgus malalignment and significant  compartment arthritis.  She does feel that the cortisone injections have helped in the past.  She has several questions involving knee surgery.  She has a significant history of itching with all pain medications from tramadol to oxycodone.  Morphine causes severe nausea vomiting.  She also takes multiple antihistamines due to the fact that she had a rash with Prolia.  She is rightfully concerned about pain control postoperatively.  She is unable to take NSAIDs due to history of GI ulcers with Mobic  Review of systems: See HPI otherwise negative  Physical exam: Right knee no abnormal warmth erythema good range of motion.  Procedures: Visit Diagnoses:  1. Post-traumatic osteoarthritis of right knee     Large Joint Inj: R knee on 04/08/2021 9:52 AM Indications: pain Details: 22 G 1.5 in needle, anterolateral approach  Arthrogram: No  Medications: 3 mL lidocaine 1 %; 40 mg methylPREDNISolone acetate 40 MG/ML Outcome: tolerated well, no immediate complications Procedure, treatment alternatives, risks and benefits explained, specific risks discussed. Consent was given by the patient. Immediately prior to procedure a time out was called to verify the correct patient, procedure, equipment, support staff and site/side marked as required. Patient was prepped and draped in the usual sterile fashion.     Plan: Discussed with patient that we can continue conservative  treatment with cortisone as long as this is effective.  As far as postoperative medication most likely will have to treat her with a trial of Norco and an antihistamine such as Benadryl as this is worked in the past and see if we can control her pain and itching.  She will follow-up with [place] as needed.  Questions encouraged and answered at length today.  Greater than 20 minutes was spent with patient today answering perioperative questions.

## 2021-04-09 ENCOUNTER — Other Ambulatory Visit: Payer: Self-pay | Admitting: Family Medicine

## 2021-05-07 ENCOUNTER — Ambulatory Visit (INDEPENDENT_AMBULATORY_CARE_PROVIDER_SITE_OTHER): Payer: Medicare HMO | Admitting: *Deleted

## 2021-05-07 DIAGNOSIS — E782 Mixed hyperlipidemia: Secondary | ICD-10-CM

## 2021-05-07 DIAGNOSIS — I10 Essential (primary) hypertension: Secondary | ICD-10-CM

## 2021-05-07 NOTE — Patient Instructions (Signed)
Visit Information  PATIENT GOALS:  Goals Addressed             This Visit's Progress    (RNCM)  Track and Manage My Blood Pressure-Hypertension   Not on track    Timeframe:  Long-Range Goal Priority:  Medium Start Date:   04/02/21                          Expected End Date: 10/30/21                      Follow Up Date 06/18/2022    Check blood pressure 3 times per week Write blood pressure results in a log and take to provider for review Low salt low fat low cholesterol diet Increase activity as tolerated Contact PCP if cold worsens or gets no better in the next few weeks Why is this important?   You won't feel high blood pressure, but it can still hurt your blood vessels.  High blood pressure can cause heart or kidney problems. It can also cause a stroke.  Making lifestyle changes like losing a little weight or eating less salt will help.  Checking your blood pressure at home and at different times of the day can help to control blood pressure.  If the doctor prescribes medicine remember to take it the way the doctor ordered.  Call the office if you cannot afford the medicine or if there are questions about it.     Notes:         Patient verbalizes understanding of instructions provided today and agrees to view in [place].   The care management team will reach out to the patient again over the next 45 business days.   Hubert Azure RN, MSN RN Care Management Coordinator  [place] [phone] [email or web address]

## 2021-05-07 NOTE — Chronic Care Management (AMB) (Signed)
Chronic Care Management   CCM RN Visit Note  05/07/2021 Name: Tracy Mckenzie MRN: [id number] DOB: [birth year]  Subjective: Tracy Mckenzie is a 65 y.o. year old female who is a primary care patient of Pcp, No. The care management team was consulted for assistance with disease management and care coordination needs.    Engaged with patient by telephone for follow up visit in response to provider referral for case management and/or care coordination services.   Consent to Services:  The patient was given information about Chronic Care Management services, agreed to services, and gave verbal consent prior to initiation of services.  Please see initial visit note for detailed documentation.   Patient agreed to services and verbal consent obtained.   Assessment: Review of patient past medical history, allergies, medications, health status, including review of consultants reports, laboratory and other test data, was performed as part of comprehensive evaluation and provision of chronic care management services.   SDOH (Social Determinants of Health) assessments and interventions performed:    CCM Care Plan  Allergies  Allergen Reactions   Meperidine Palpitations and Anaphylaxis    flushed   Simvastatin     Other reaction(s): Muscle Pain   Oxycodone-Acetaminophen Itching and Rash   Prolia [Denosumab] Rash   Vicodin [Hydrocodone-Acetaminophen] Itching    Outpatient Encounter Medications as of 05/07/2021  Medication Sig Note   levocetirizine (XYZAL) 5 MG tablet Take 5 mg by mouth 2 (two) times daily.    montelukast (SINGULAIR) 10 MG tablet Take 1 tablet (10 mg total) by mouth at bedtime.    albuterol (VENTOLIN HFA) 108 (90 Base) MCG/ACT inhaler Inhale 2 puffs into the lungs every 6 (six) hours as needed for wheezing or shortness of breath.    azelastine (ASTELIN) 0.1 % nasal spray Place 1 spray into both nostrils 2 (two) times daily as needed for rhinitis or allergies.    Cholecalciferol  (VITAMIN D3 PO) Take 10,000 Units by mouth daily.    COCONUT OIL PO Take by mouth. (Patient not taking: Reported on 04/02/2021) 04/02/2021: Need to refill   Coenzyme Q10 (CO Q-10) 100 MG CAPS Take 100 mg by mouth daily.    cyclobenzaprine (FLEXERIL) 10 MG tablet Take 1/2-1 tablet as needed for muscle spasms BID    fluticasone (CUTIVATE) 0.05 % cream Apply 1 application topically as needed (rash).    hydrochlorothiazide (MICROZIDE) 12.5 MG capsule Take 1 capsule (12.5 mg total) by mouth daily.    hydrOXYzine (ATARAX/VISTARIL) 25 MG tablet TAKE 1 TABLET BY MOUTH THREE TIMES A DAY AS NEEDED FOR ITCHING    losartan (COZAAR) 50 MG tablet TAKE ONE TABLET BY MOUTH TWICE A DAY FOR FOR BLOOD PRESSURE    magnesium 30 MG tablet Take 90 mg by mouth daily.    metroNIDAZOLE (METROCREAM) 0.75 % cream Apply 1 application topically 2 (two) times daily.    Multiple Vitamins-Minerals (PRESERVISION AREDS PO) Take 1 tablet by mouth daily.    nitroGLYCERIN (NITROSTAT) 0.4 MG SL tablet Place 1 tablet (0.4 mg total) under the tongue every 5 (five) minutes as needed for chest pain.    omeprazole (PRILOSEC) 40 MG capsule TAKE ONE CAPSULE BY MOUTH DAILY 30 MINUTES BEFORE BREAKFAST (Patient not taking: Reported on 04/02/2021)    Potassium 99 MG TABS Take 99 mg by mouth daily.    QUERCETIN PO Take 1 tablet by mouth daily.    Spacer/Aero-Holding Chambers (PRO COMFORT SPACER ADULT) MISC 1 Device by Does not apply route as needed.    SUPER  B COMPLEX/C PO Take 1 tablet by mouth daily. (Patient not taking: Reported on 04/02/2021)    tacrolimus (PROTOPIC) 0.1 % ointment Apply 1 application topically as needed (rash).    traMADol-acetaminophen (ULTRACET) 37.5-325 MG tablet Take 1 tablet by mouth every 8 (eight) hours as needed. (Patient taking differently: Take 1 tablet by mouth every 8 (eight) hours as needed for moderate pain.)    zinc gluconate 50 MG tablet Take 50 mg by mouth daily.    Facility-Administered Encounter Medications as of  05/07/2021  Medication   0.9 %  sodium chloride infusion    Patient Active Problem List   Diagnosis Date Noted   Back pain 11/27/2020   Osteopenia 11/27/2020   Sciatica 11/27/2020   Surgical menopause 11/27/2020   Tibia/fibula fracture 11/27/2020   Unilateral primary osteoarthritis, right knee 09/05/2019   Hyperlipidemia 08/15/2019   Essential hypertension 08/13/2019   GERD (gastroesophageal reflux disease) 08/13/2019   Osteoporosis 08/13/2019   Post-traumatic osteoarthritis of right knee 08/13/2019   Hiatal hernia 08/13/2019   Chronic left-sided low back pain without sciatica 08/13/2019    Conditions to be addressed/monitored:HTN and HLD  Care Plan : [place]  Updates made by Leona Singleton, RN since 05/07/2021 12:00 AM     Problem: Knowledge deficit related to self care management of hypertension and hyperlipidemia   Priority: Medium     Long-Range Goal: Patient will work with the CCM team to better manager hypertension and hyperlipidemia   Start Date: 04/02/2021  Expected End Date: 10/30/2021  This Visit's Progress: Not on track  Priority: Medium  Note:   Current Barriers:  Knowledge Deficits related to plan of care for management of HTN and HLD  patient reporting normally checking blood pressures a few times a week with normal ranges of 140/70's.  Has not been checking regularly due to recent increases related to over the counter cold medications and busy work schedule.  Acknowledges allergies to statins and trying to manage cholesterol numbers with diet and natural medications  RNCM Clinical Goal(s):  Patient will verbalize understanding of plan for management of HTN and HLD continue to work with RN Care Manager to address care management and care coordination needs related to HTN and HLD  through collaboration with RN Care manager, provider, and care team.   Interventions: 1:1 collaboration with primary care provider regarding development and update of  comprehensive plan of care as evidenced by provider attestation and co-signature Inter-disciplinary care team collaboration (see longitudinal plan of care) Evaluation of current treatment plan related to  self management and patient's adherence to plan as established by provider   Hyperlipidemia:  (Status: New goal. Goal on track: YES.) Lab Results  Component Value Date   CHOL 235 (H) 09/11/2020   HDL 62.10 09/11/2020   LDLCALC 159 (H) 09/11/2020   TRIG 72.0 09/11/2020   CHOLHDL 4 09/11/2020     Medication review performed; medication list updated in electronic medical record.  Provider established cholesterol goals reviewed; Counseled on importance of regular laboratory monitoring as prescribed; Provided HLD educational materials;  Hypertension: (Status: New goal. Goal on track: NO.) Last practice recorded BP readings:  BP Readings from Last 3 Encounters:  12/09/20 110/68  11/27/20 (!) 148/80  11/15/20 134/74  Most recent eGFR/CrCl: No results found for: EGFR  No components found for: CRCL  Evaluation of current treatment plan related to hypertension self management and patient's adherence to plan as established by provider;   Reviewed prescribed diet low salt  low fat low cholesterol Reviewed medications with patient and discussed importance of compliance;  Discussed plans with patient for ongoing care management follow up and provided patient with direct contact information for care management team; Advised patient, providing education and rationale, to monitor blood pressure daily and record, calling PCP for findings outside established parameters;  Discussed complications of poorly controlled blood pressure such as heart disease, stroke, circulatory complications, vision complications, kidney impairment, sexual dysfunction;   Patient Goals/Self-Care Activities: Patient will self administer medications as prescribed Patient will attend all scheduled provider  appointments Patient will call pharmacy for medication refills Patient will call provider office for new concerns or questions Check blood pressure 3 times per week Write blood pressure results in a log and take to provider for review Low salt low fat low cholesterol diet Increase activity as tolerated Check blood pressure 3 times per week Write blood pressure results in a log and take to provider for review Low salt low fat low cholesterol diet Increase activity as tolerated Contact PCP if cold worsens or gets no better in the next few weeks     Plan:The care management team will reach out to the patient again over the next 45 business days.  Hubert Azure RN, MSN RN Care Management Coordinator  [place] [phone] [email or web address]

## 2021-05-10 ENCOUNTER — Encounter (HOSPITAL_BASED_OUTPATIENT_CLINIC_OR_DEPARTMENT_OTHER): Payer: Self-pay | Admitting: Emergency Medicine

## 2021-05-10 ENCOUNTER — Emergency Department (HOSPITAL_BASED_OUTPATIENT_CLINIC_OR_DEPARTMENT_OTHER)
Admission: EM | Admit: 2021-05-10 | Discharge: 2021-05-10 | Disposition: A | Discharge status: Home / Self Care | Payer: Medicare HMO | Attending: Emergency Medicine | Admitting: Emergency Medicine

## 2021-05-10 DIAGNOSIS — Z79899 Other long term (current) drug therapy: Secondary | ICD-10-CM | POA: Insufficient documentation

## 2021-05-10 DIAGNOSIS — I10 Essential (primary) hypertension: Secondary | ICD-10-CM | POA: Diagnosis not present

## 2021-05-10 DIAGNOSIS — M62830 Muscle spasm of back: Secondary | ICD-10-CM

## 2021-05-10 DIAGNOSIS — R053 Chronic cough: Secondary | ICD-10-CM | POA: Diagnosis not present

## 2021-05-10 DIAGNOSIS — M549 Dorsalgia, unspecified: Secondary | ICD-10-CM | POA: Diagnosis present

## 2021-05-10 DIAGNOSIS — R059 Cough, unspecified: Secondary | ICD-10-CM | POA: Diagnosis not present

## 2021-05-10 MED ORDER — HYDROCOD POLST-CPM POLST ER 10-8 MG/5ML PO SUER
5.0000 mL | Freq: Once | ORAL | Status: AC
  Administered 2021-05-10: 5 mL via ORAL
  Filled 2021-05-10: qty 5

## 2021-05-10 MED ORDER — HYDROXYZINE HCL 25 MG PO TABS
25.0000 mg | ORAL_TABLET | Freq: Once | ORAL | Status: AC
  Administered 2021-05-10: 25 mg via ORAL
  Filled 2021-05-10: qty 1

## 2021-05-10 MED ORDER — DIAZEPAM 5 MG PO TABS: 5.0000 mg | ORAL_TABLET | Freq: Two times a day (BID) | ORAL | 0 refills | Status: DC

## 2021-05-10 MED ORDER — HYDROCOD POLST-CPM POLST ER 10-8 MG/5ML PO SUER: 5.0000 mL | Freq: Two times a day (BID) | ORAL | 0 refills | Status: DC | PRN

## 2021-05-10 MED ORDER — HYDROXYZINE HCL 25 MG PO TABS: 25.0000 mg | ORAL_TABLET | Freq: Four times a day (QID) | ORAL | 0 refills | Status: DC

## 2021-05-10 MED ORDER — DIAZEPAM 5 MG/ML IJ SOLN
5.0000 mg | Freq: Once | INTRAMUSCULAR | Status: AC
  Administered 2021-05-10: 5 mg via INTRAMUSCULAR
  Filled 2021-05-10: qty 2

## 2021-05-10 NOTE — ED Triage Notes (Signed)
Pt reports back spasms post coughing fit since Tuesday.  Pt states she is unable to sleep due to discomfort.  Pt states she cannot take ibuprofen so has taken tylenol at home.

## 2021-05-10 NOTE — ED Notes (Signed)
Pt states congested cough over past 3 weeks, has history of chronic cough and sees specialist, pt reports when coughing has exacerbation of lower back pain.  Unable to take NSAIDS r/t stomach ulcers.  Tenderness to palpation of lumbar spine.

## 2021-05-10 NOTE — ED Provider Notes (Signed)
[facility] EMERGENCY DEPT Provider Note   CSN: [id number] Arrival date & time: 05/10/21  1234     History Chief Complaint  Patient presents with   Back Pain    Tracy Mckenzie is a 65 y.o. female.  Pt presents to the ED today with back pain and a cough.  Pt said she has had a chronic cough for several years.  She has seen a specialist and they have told her that it is allergies.  She said she had a coughing fit on Tuesday, 10/4 and felt a spasm in her back radiate outwards.  She said she can't move or sleep without pain.  She said she is unable to take nsaids due to hx of ulcers.  Pt denies any numbness in her legs, but has left sided sciatica occasionally.  That predates this new back injury.  Pt has been taking tylenol for pain, but it has not been helping.      Past Medical History:  Diagnosis Date   Arthritis    Bleeding ulcer    Elevated cholesterol    Hiatal hernia    High blood pressure    Kidney cysts    Liver cyst    Nerve pain    L sciatic pain radiating to R   Peptic ulcer disease     Patient Active Problem List   Diagnosis Date Noted   Back pain 11/27/2020   Osteopenia 11/27/2020   Sciatica 11/27/2020   Surgical menopause 11/27/2020   Tibia/fibula fracture 11/27/2020   Unilateral primary osteoarthritis, right knee 09/05/2019   Hyperlipidemia 08/15/2019   Essential hypertension 08/13/2019   GERD (gastroesophageal reflux disease) 08/13/2019   Osteoporosis 08/13/2019   Post-traumatic osteoarthritis of right knee 08/13/2019   Hiatal hernia 08/13/2019   Chronic left-sided low back pain without sciatica 08/13/2019    Past Surgical History:  Procedure Laterality Date   APPENDECTOMY  1992   BREAST CYST ASPIRATION Left 2003   KNEE ARTHROSCOPY Right 04/12/2001   torn cartilage   LAPAROSCOPY  1981   for endometriosis   LEG SURGERY Right 11/23/2000   to remove screws   LEG SURGERY Right 02/08/2002   to remove rod   MENISCUS REPAIR   01/29/2003   TIBIA FRACTURE SURGERY Right 06/12/2000   Multiple surgeries   TONSILLECTOMY  1967   TOTAL ABDOMINAL HYSTERECTOMY  1992     OB History   No obstetric history on file.     Family History  Problem Relation Age of Onset   Kidney disease Mother    COPD Mother    Breast cancer Maternal Aunt    Breast cancer Paternal Aunt    COPD Father    Diabetes Maternal Grandfather    Stroke Paternal Grandfather    Colon cancer Neg Hx     Social History   Tobacco Use   Smoking status: Never   Smokeless tobacco: Never  Substance Use Topics   Alcohol use: No   Drug use: No    Home Medications Prior to Admission medications   Medication Sig Start Date End Date Taking? Authorizing Provider  chlorpheniramine-HYDROcodone (TUSSIONEX PENNKINETIC ER) 10-8 MG/5ML SUER Take 5 mLs by mouth every 12 (twelve) hours as needed for cough. 05/10/21  Yes Isla Pence, MD  diazepam (VALIUM) 5 MG tablet Take 1 tablet (5 mg total) by mouth 2 (two) times daily. 05/10/21  Yes Isla Pence, MD  hydrOXYzine (ATARAX/VISTARIL) 25 MG tablet Take 1 tablet (25 mg total) by mouth every 6 (six)  hours. 05/10/21  Yes Isla Pence, MD  albuterol (VENTOLIN HFA) 108 (90 Base) MCG/ACT inhaler Inhale 2 puffs into the lungs every 6 (six) hours as needed for wheezing or shortness of breath. 11/27/20   Orma Flaming, MD  azelastine (ASTELIN) 0.1 % nasal spray Place 1 spray into both nostrils 2 (two) times daily as needed for rhinitis or allergies. 11/27/20   Orma Flaming, MD  Cholecalciferol (VITAMIN D3 PO) Take 10,000 Units by mouth daily.    [provider]  COCONUT OIL PO Take by mouth. Patient not taking: Reported on 04/02/2021    [provider]  Coenzyme Q10 (CO Q-10) 100 MG CAPS Take 100 mg by mouth daily.    [provider]  cyclobenzaprine (FLEXERIL) 10 MG tablet Take 1/2-1 tablet as needed for muscle spasms BID 11/27/20   Orma Flaming, MD  fluticasone (CUTIVATE) 0.05 % cream  Apply 1 application topically as needed (rash). 06/12/20   [provider]  hydrochlorothiazide (MICROZIDE) 12.5 MG capsule Take 1 capsule (12.5 mg total) by mouth daily. 11/03/20   Orma Flaming, MD  levocetirizine (XYZAL) 5 MG tablet Take 5 mg by mouth 2 (two) times daily.    [provider]  losartan (COZAAR) 50 MG tablet TAKE ONE TABLET BY MOUTH TWICE A DAY FOR FOR BLOOD PRESSURE 04/10/21   Allwardt, Alyssa M, PA-C  magnesium 30 MG tablet Take 90 mg by mouth daily.    [provider]  metroNIDAZOLE (METROCREAM) 0.75 % cream Apply 1 application topically 2 (two) times daily. 06/12/20   [provider]  montelukast (SINGULAIR) 10 MG tablet Take 1 tablet (10 mg total) by mouth at bedtime. 11/27/20   Orma Flaming, MD  Multiple Vitamins-Minerals (PRESERVISION AREDS PO) Take 1 tablet by mouth daily.    [provider]  nitroGLYCERIN (NITROSTAT) 0.4 MG SL tablet Place 1 tablet (0.4 mg total) under the tongue every 5 (five) minutes as needed for chest pain. 11/27/20   Orma Flaming, MD  omeprazole (PRILOSEC) 40 MG capsule TAKE ONE CAPSULE BY MOUTH DAILY 30 MINUTES BEFORE BREAKFAST Patient not taking: Reported on 04/02/2021 11/03/20   Orma Flaming, MD  Potassium 99 MG TABS Take 99 mg by mouth daily.    [provider]  QUERCETIN PO Take 1 tablet by mouth daily.    [provider]  Spacer/Aero-Holding Chambers (PRO COMFORT SPACER ADULT) MISC 1 Device by Does not apply route as needed. 03/07/20   Orma Flaming, MD  SUPER B COMPLEX/C PO Take 1 tablet by mouth daily. Patient not taking: Reported on 04/02/2021    [provider]  tacrolimus (PROTOPIC) 0.1 % ointment Apply 1 application topically as needed (rash). 08/04/20   [provider]  traMADol-acetaminophen (ULTRACET) 37.5-325 MG tablet Take 1 tablet by mouth every 8 (eight) hours as needed. Patient taking differently: Take 1 tablet by mouth every 8 (eight) hours as needed for  moderate pain. 03/07/20   Orma Flaming, MD  zinc gluconate 50 MG tablet Take 50 mg by mouth daily.    [provider]    Allergies    Meperidine, Ibuprofen, Simvastatin, Oxycodone-acetaminophen, Prolia [denosumab], and Vicodin [hydrocodone-acetaminophen]  Review of Systems   Review of Systems  Respiratory:  Positive for cough.   Musculoskeletal:  Positive for back pain.  All other systems reviewed and are negative.  Physical Exam Updated Vital Signs BP (!) 156/93 (BP Location: Right Arm)   Pulse 81   Temp 98.1 F (36.7 C) (Temporal)   Resp  16   Ht 5\' 7"  (1.702 m)   Wt 81.6 kg   SpO2 95%   BMI 28.19 kg/m   Physical Exam Vitals and nursing note reviewed.  Constitutional:      Appearance: Normal appearance.  HENT:     Head: Normocephalic and atraumatic.     Right Ear: External ear normal.     Left Ear: External ear normal.     Nose: Nose normal.     Mouth/Throat:     Mouth: Mucous membranes are moist.     Pharynx: Oropharynx is clear.  Eyes:     Extraocular Movements: Extraocular movements intact.     Conjunctiva/sclera: Conjunctivae normal.     Pupils: Pupils are equal, round, and reactive to light.  Cardiovascular:     Rate and Rhythm: Normal rate and regular rhythm.     Pulses: Normal pulses.     Heart sounds: Normal heart sounds.  Pulmonary:     Effort: Pulmonary effort is normal.     Breath sounds: Normal breath sounds.  Abdominal:     General: Abdomen is flat. Bowel sounds are normal.     Palpations: Abdomen is soft.  Musculoskeletal:     Cervical back: Normal range of motion and neck supple.       Back:  Skin:    General: Skin is warm and dry.     Capillary Refill: Capillary refill takes less than 2 seconds.  Neurological:     General: No focal deficit present.     Mental Status: She is alert and oriented to person, place, and time.  Psychiatric:        Mood and Affect: Mood normal.        Behavior: Behavior normal.    ED Results /  Procedures / Treatments   Labs (all labs ordered are listed, but only abnormal results are displayed) Labs Reviewed - No data to display  EKG None  Radiology No results found.  Procedures Procedures   Medications Ordered in ED Medications  chlorpheniramine-HYDROcodone (TUSSIONEX) 10-8 MG/5ML suspension 5 mL (5 mLs Oral Given 05/10/21 1530)  hydrOXYzine (ATARAX/VISTARIL) tablet 25 mg (25 mg Oral Given 05/10/21 1530)  diazepam (VALIUM) injection 5 mg (5 mg Intramuscular Given 05/10/21 1530)    ED Course  I have reviewed the triage vital signs and the nursing notes.  Pertinent labs & imaging results that were available during my care of the patient were reviewed by me and considered in my medical decision making (see chart for details).    MDM Rules/Calculators/A&P                           Pt will be put on tussionex which will help the cough and the pain.  She is also d/c with valium for the spasm.  Atarax is for the itching pt gets with pain meds.  Pt knows to return if worse.  F/u with pcp. Final Clinical Impression(s) / ED Diagnoses Final diagnoses:  Lumbar paraspinal muscle spasm  Chronic cough    Rx / DC Orders ED Discharge Orders          Ordered    chlorpheniramine-HYDROcodone (TUSSIONEX PENNKINETIC ER) 10-8 MG/5ML SUER  Every 12 hours PRN        05/10/21 1524    diazepam (VALIUM) 5 MG tablet  2 times daily        05/10/21 1524    hydrOXYzine (ATARAX/VISTARIL) 25 MG tablet  Every  6 hours        05/10/21 1524             Isla Pence, MD 05/10/21 1536

## 2021-05-20 ENCOUNTER — Other Ambulatory Visit: Payer: Self-pay

## 2021-05-20 ENCOUNTER — Encounter: Payer: Self-pay | Admitting: Physician Assistant

## 2021-05-20 ENCOUNTER — Ambulatory Visit (INDEPENDENT_AMBULATORY_CARE_PROVIDER_SITE_OTHER): Payer: Medicare HMO | Admitting: Physician Assistant

## 2021-05-20 VITALS — BP 136/84 | HR 95 | Temp 98.0°F | Ht 67.0 in | Wt 176.8 lb

## 2021-05-20 DIAGNOSIS — Z23 Encounter for immunization: Secondary | ICD-10-CM | POA: Diagnosis not present

## 2021-05-20 DIAGNOSIS — M62830 Muscle spasm of back: Secondary | ICD-10-CM | POA: Diagnosis not present

## 2021-05-20 DIAGNOSIS — M5442 Lumbago with sciatica, left side: Secondary | ICD-10-CM

## 2021-05-20 MED ORDER — METHYLPREDNISOLONE 4 MG PO TBPK: ORAL_TABLET | ORAL | 0 refills | Status: DC

## 2021-05-20 MED ORDER — KETOROLAC TROMETHAMINE 60 MG/2ML IM SOLN
30.0000 mg | Freq: Once | INTRAMUSCULAR | Status: AC
  Administered 2021-05-20: 30 mg via INTRAMUSCULAR

## 2021-05-20 MED ORDER — HYDROXYZINE HCL 25 MG PO TABS: 25.0000 mg | ORAL_TABLET | Freq: Four times a day (QID) | ORAL | 0 refills | Status: DC

## 2021-05-20 MED ORDER — BACLOFEN 10 MG PO TABS: 10.0000 mg | ORAL_TABLET | Freq: Three times a day (TID) | ORAL | 0 refills | Status: DC

## 2021-05-20 NOTE — Progress Notes (Signed)
Subjective:    Patient ID: Tracy Mckenzie, female    DOB: 1956-03-30, 65 y.o.   MRN: [id number]  Chief Complaint  Patient presents with   Cough   Transitions Of Care   Back Pain    Rt side due to back spasms from coughing. Nerve pain on the left.     HPI Patient is in today for transition of care from Dr. Rogers Blocker. She is here with her husband, Clair Gulling.   Main concern to address today is acute on chronic low back pain: -Cough x 3 years. Specialist said probably allergies. Went into a coughing spasm, which led to ED visit on 05/10/21. Valium has helped with spasms, but she has not had any relief from the pain. Works from home, but says she is still in agony. Meloxicam previously gave her stomach ulcers. States she can take pain medications with hydroxyzine, otherwise has itching from head to toe.   Currently using Icy-Hot, Flexeril 10 mg prn, Tylenol prn, Tramadol (only prn otherwise has a lot of itching). She usually uses Low-Back Trax machine as well, but hasn't been able to use it recently.  Muscle pain on right side low back, nerve pain on left side into left buttock. No fever. No loss of bowel or bladder. No hx cancer. MRI L-spine in 10/11/2019 showed multilevel disc and facet degeneration causing significant spinal and subarticular stenosis bilaterally.   Past Medical History:  Diagnosis Date   Arthritis    Bleeding ulcer    Elevated cholesterol    Hiatal hernia    High blood pressure    Kidney cysts    Liver cyst    Nerve pain    L sciatic pain radiating to R   Peptic ulcer disease     Past Surgical History:  Procedure Laterality Date   APPENDECTOMY  1992   BREAST CYST ASPIRATION Left 2003   KNEE ARTHROSCOPY Right 04/12/2001   torn cartilage   LAPAROSCOPY  1981   for endometriosis   LEG SURGERY Right 11/23/2000   to remove screws   LEG SURGERY Right 02/08/2002   to remove rod   MENISCUS REPAIR  01/29/2003   TIBIA FRACTURE SURGERY Right 06/12/2000   Multiple surgeries    TONSILLECTOMY  1967   TOTAL ABDOMINAL HYSTERECTOMY  1992    Family History  Problem Relation Age of Onset   Kidney disease Mother    COPD Mother    Breast cancer Maternal Aunt    Breast cancer Paternal Aunt    COPD Father    Diabetes Maternal Grandfather    Stroke Paternal Grandfather    Colon cancer Neg Hx     Social History   Tobacco Use   Smoking status: Never   Smokeless tobacco: Never  Substance Use Topics   Alcohol use: No   Drug use: No     Allergies  Allergen Reactions   Meperidine Palpitations and Anaphylaxis    flushed   Ibuprofen Other (See Comments)    Stomach ulcers.   Simvastatin     Other reaction(s): Muscle Pain   Oxycodone-Acetaminophen Itching and Rash   Prolia [Denosumab] Rash   Vicodin [Hydrocodone-Acetaminophen] Itching    Review of Systems REFER TO HPI FOR PERTINENT POSITIVES AND NEGATIVES      Objective:     BP 136/84   Pulse 95   Temp 98 F (36.7 C) (Temporal)   Ht 5\' 7"  (1.702 m)   Wt 176 lb 12.8 oz (80.2 kg)   SpO2  97%   BMI 27.69 kg/m   Wt Readings from Last 3 Encounters:  05/20/21 176 lb 12.8 oz (80.2 kg)  05/10/21 180 lb (81.6 kg)  12/09/20 183 lb 6.4 oz (83.2 kg)    BP Readings from Last 3 Encounters:  05/20/21 136/84  05/10/21 (!) 167/72  12/09/20 110/68     Physical Exam Vitals and nursing note reviewed.  Constitutional:      Appearance: Normal appearance. She is normal weight. She is not toxic-appearing.  HENT:     Head: Normocephalic and atraumatic.  Eyes:     Extraocular Movements: Extraocular movements intact.     Conjunctiva/sclera: Conjunctivae normal.     Pupils: Pupils are equal, round, and reactive to light.  Cardiovascular:     Rate and Rhythm: Normal rate and regular rhythm.     Pulses: Normal pulses.     Heart sounds: Normal heart sounds.  Pulmonary:     Effort: Pulmonary effort is normal.     Breath sounds: Normal breath sounds.  Musculoskeletal:     Cervical back: Normal range of  motion and neck supple.     Lumbar back: Spasms and tenderness present. Positive left straight leg raise test. Negative right straight leg raise test.  Skin:    General: Skin is warm and dry.  Neurological:     General: No focal deficit present.     Mental Status: She is alert and oriented to person, place, and time.  Psychiatric:        Mood and Affect: Mood normal.        Behavior: Behavior normal.        Thought Content: Thought content normal.        Judgment: Judgment normal.       Assessment & Plan:   Problem List Items Addressed This Visit       Other   Back pain - Primary   Relevant Medications   baclofen (LIORESAL) 10 MG tablet   methylPREDNISolone (MEDROL DOSEPAK) 4 MG TBPK tablet   Other Visit Diagnoses     Spasm of muscle of lower back       Relevant Medications   ketorolac (TORADOL) injection 30 mg (Completed)   Need for immunization against influenza       Relevant Orders   Flu Vaccine QUAD High Dose(Fluad) (Completed)        Meds ordered this encounter  Medications   baclofen (LIORESAL) 10 MG tablet    Sig: Take 1 tablet (10 mg total) by mouth 3 (three) times daily.    Dispense:  30 each    Refill:  0   methylPREDNISolone (MEDROL DOSEPAK) 4 MG TBPK tablet    Sig: Please take per packaging instructions.    Dispense:  21 tablet    Refill:  0   hydrOXYzine (ATARAX/VISTARIL) 25 MG tablet    Sig: Take 1 tablet (25 mg total) by mouth every 6 (six) hours.    Dispense:  30 tablet    Refill:  0   ketorolac (TORADOL) injection 30 mg    1. Acute bilateral low back pain with left-sided sciatica 2. Spasm of muscle of lower back I personally reviewed her ED report from 05/20/2021 and also her prior MRI report.  She is still having significant acute on chronic low back pain and spasm.  Several options discussed with patient today.  She has done well with steroids in the past.  She has never taken gabapentin.  She has significant history of  itching with pain  medications, but does well in conjunction with hydroxyzine.  There were no red flags on her exam.  She does have a history of gastric ulcers secondary to meloxicam use, no current ulcer present.  For acute relief of pain today, we agreed that Toradol IM 30 mg may be beneficial and would run minimal risk for ulcer issues.  We also agreed to trial a prednisone Dosepak starting tomorrow.  She is going to try baclofen as a muscle relaxer in place of the Flexeril.  We also discussed possibly another course of PT and/or seeing spine specialist if her pain persists.  3. Need for immunization against influenza Flu shot updated in office.    This note was prepared with assistance of Systems analyst. Occasional wrong-word or sound-a-like substitutions may have occurred due to the inherent limitations of voice recognition software.  Time Spent: 39 minutes of total time was spent on the date of the encounter performing the following actions: chart review prior to seeing the patient, obtaining history, performing a medically necessary exam, counseling on the treatment plan, placing orders, and documenting in our EHR.    Louise Rawson M Besnik Febus, PA-C

## 2021-05-20 NOTE — Patient Instructions (Addendum)
Good to meet you today!   1/2 dose of Toradol (30 mg) IM given in office today to help with spasm and pain.   Start on medrol dose pak tomorrow morning. Trial baclofen instead of cyclobenzaprine for muscle spasm relief.  You can also take Tylenol on a scheduled basis (1000 mg at breakfast, lunch, and dinner) to help with pain.  Please let me know how you are doing! Consider PT with Lauren.  Continue ice and stretching at home as well.

## 2021-05-25 ENCOUNTER — Encounter: Payer: Self-pay | Admitting: Physician Assistant

## 2021-05-26 ENCOUNTER — Other Ambulatory Visit: Payer: Self-pay | Admitting: Physician Assistant

## 2021-05-26 ENCOUNTER — Telehealth: Payer: Self-pay

## 2021-05-26 ENCOUNTER — Other Ambulatory Visit: Payer: Self-pay

## 2021-05-26 MED ORDER — PREGABALIN 25 MG PO CAPS: 25.0000 mg | ORAL_CAPSULE | Freq: Two times a day (BID) | ORAL | 0 refills | Status: DC

## 2021-05-26 MED ORDER — GABAPENTIN 300 MG PO CAPS: 300.0000 mg | ORAL_CAPSULE | Freq: Two times a day (BID) | ORAL | 0 refills | Status: DC

## 2021-05-26 NOTE — Telephone Encounter (Signed)
Pt called stating that she saw Alyssa a few weeks ago and would now like a nurse to call back. Pt stated that Alyssa discussed a nerve pill and Pam wants to know if we can call the prescription in for her. Please Advise.

## 2021-05-26 NOTE — Telephone Encounter (Signed)
Patient stated Lyrica is contraindicated to current medications. Patient requesting Gabapentin prescription. Rx sent in per Alyssa 300 mg twice  daily Patient notified

## 2021-06-01 DIAGNOSIS — I10 Essential (primary) hypertension: Secondary | ICD-10-CM

## 2021-06-01 DIAGNOSIS — E782 Mixed hyperlipidemia: Secondary | ICD-10-CM | POA: Diagnosis not present

## 2021-06-06 ENCOUNTER — Other Ambulatory Visit: Payer: Self-pay | Admitting: Physician Assistant

## 2021-06-18 ENCOUNTER — Telehealth: Payer: Self-pay | Admitting: *Deleted

## 2021-06-18 ENCOUNTER — Telehealth: Payer: Medicare HMO

## 2021-06-18 NOTE — Telephone Encounter (Signed)
  Care Management   Follow Up Note   06/18/2021 Name: Tracy Mckenzie MRN: [id number] DOB: 1956/05/17   Referred by: Allwardt, Randa Evens, PA-C Reason for referral : Chronic Care Management (HTN, Hyperlipidemia)   An unsuccessful telephone outreach was attempted today. The patient was referred to the case management team for assistance with care management and care coordination.   Follow Up Plan: RNCM will seek assistance from [facility] in rescheduling appointment within the next 30 days.  Hubert Azure RN, MSN RN Care Management Coordinator  [place] [phone] [email or web address]

## 2021-06-21 ENCOUNTER — Other Ambulatory Visit: Payer: Self-pay | Admitting: Physician Assistant

## 2021-07-01 ENCOUNTER — Other Ambulatory Visit: Payer: Self-pay | Admitting: Physician Assistant

## 2021-07-02 ENCOUNTER — Encounter: Payer: Self-pay | Admitting: Physician Assistant

## 2021-07-02 ENCOUNTER — Ambulatory Visit (INDEPENDENT_AMBULATORY_CARE_PROVIDER_SITE_OTHER): Payer: Medicare HMO | Admitting: Physician Assistant

## 2021-07-02 ENCOUNTER — Other Ambulatory Visit: Payer: Self-pay

## 2021-07-02 ENCOUNTER — Telehealth: Payer: Self-pay

## 2021-07-02 VITALS — BP 173/92 | HR 78 | Temp 98.2°F | Ht 66.0 in | Wt 174.2 lb

## 2021-07-02 DIAGNOSIS — K219 Gastro-esophageal reflux disease without esophagitis: Secondary | ICD-10-CM | POA: Diagnosis not present

## 2021-07-02 DIAGNOSIS — I10 Essential (primary) hypertension: Secondary | ICD-10-CM

## 2021-07-02 DIAGNOSIS — J302 Other seasonal allergic rhinitis: Secondary | ICD-10-CM | POA: Diagnosis not present

## 2021-07-02 DIAGNOSIS — E782 Mixed hyperlipidemia: Secondary | ICD-10-CM

## 2021-07-02 DIAGNOSIS — M5442 Lumbago with sciatica, left side: Secondary | ICD-10-CM

## 2021-07-02 DIAGNOSIS — D72829 Elevated white blood cell count, unspecified: Secondary | ICD-10-CM | POA: Diagnosis not present

## 2021-07-02 DIAGNOSIS — M62830 Muscle spasm of back: Secondary | ICD-10-CM | POA: Diagnosis not present

## 2021-07-02 DIAGNOSIS — E559 Vitamin D deficiency, unspecified: Secondary | ICD-10-CM

## 2021-07-02 DIAGNOSIS — Z131 Encounter for screening for diabetes mellitus: Secondary | ICD-10-CM | POA: Diagnosis not present

## 2021-07-02 LAB — COMPREHENSIVE METABOLIC PANEL
ALT: 14 U/L (ref 0–35)
AST: 14 U/L (ref 0–37)
Albumin: 4.3 g/dL (ref 3.5–5.2)
Alkaline Phosphatase: 111 U/L (ref 39–117)
BUN: 13 mg/dL (ref 6–23)
CO2: 29 mEq/L (ref 19–32)
Calcium: 9.9 mg/dL (ref 8.4–10.5)
Chloride: 107 mEq/L (ref 96–112)
Creatinine, Ser: 0.89 mg/dL (ref 0.40–1.20)
GFR: 67.06 mL/min (ref >60.00)
Glucose, Bld: 95 mg/dL (ref 70–99)
Potassium: 4.3 mEq/L (ref 3.5–5.1)
Sodium: 144 mEq/L (ref 135–145)
Total Bilirubin: 0.5 mg/dL (ref 0.2–1.2)
Total Protein: 6.6 g/dL (ref 6.0–8.3)

## 2021-07-02 LAB — LIPID PANEL
Cholesterol: 187 mg/dL (ref 0–200)
HDL: 57.1 mg/dL (ref >39.00)
LDL Cholesterol: 109 mg/dL — ABNORMAL HIGH (ref 0–99)
NonHDL: 129.79
Total CHOL/HDL Ratio: 3
Triglycerides: 105 mg/dL (ref 0.0–149.0)
VLDL: 21 mg/dL (ref 0.0–40.0)

## 2021-07-02 LAB — CBC WITH DIFFERENTIAL/PLATELET
Basophils Absolute: 0.1 10*3/uL (ref 0.0–0.1)
Basophils Relative: 0.6 % (ref 0.0–3.0)
Eosinophils Absolute: 0.3 10*3/uL (ref 0.0–0.7)
Eosinophils Relative: 2.5 % (ref 0.0–5.0)
HCT: 39.2 % (ref 36.0–46.0)
Hemoglobin: 12.9 g/dL (ref 12.0–15.0)
Lymphocytes Relative: 18.8 % (ref 12.0–46.0)
Lymphs Abs: 2 10*3/uL (ref 0.7–4.0)
MCHC: 32.9 g/dL (ref 30.0–36.0)
MCV: 92.1 fl (ref 78.0–100.0)
Monocytes Absolute: 0.7 10*3/uL (ref 0.1–1.0)
Monocytes Relative: 6.8 % (ref 3.0–12.0)
Neutro Abs: 7.5 10*3/uL (ref 1.4–7.7)
Neutrophils Relative %: 71.3 % (ref 43.0–77.0)
Platelets: 301 10*3/uL (ref 150.0–400.0)
RBC: 4.26 Mil/uL (ref 3.87–5.11)
RDW: 17.4 % — ABNORMAL HIGH (ref 11.5–15.5)
WBC: 10.6 10*3/uL — ABNORMAL HIGH (ref 4.0–10.5)

## 2021-07-02 LAB — VITAMIN D 25 HYDROXY (VIT D DEFICIENCY, FRACTURES): VITD: 77.5 ng/mL (ref 30.00–100.00)

## 2021-07-02 LAB — HEMOGLOBIN A1C: Hgb A1c MFr Bld: 5.6 % (ref 4.6–6.5)

## 2021-07-02 NOTE — Telephone Encounter (Signed)
Patient is requesting call back to review med.  States she had thought Allwardt had sent the scripts in that she was needing today.  She can not remember what scripts she needs. Please give her a call to review.

## 2021-07-02 NOTE — Patient Instructions (Addendum)
Labs today, will call with results.  Cont checking BP at home. MyChart with updates. Losartan 50 mg twice daily HCTZ 12.5 mg twice daily Low salt diet   Let me know how you are doing with pain. Consider referral to sports med.

## 2021-07-02 NOTE — Progress Notes (Signed)
Subjective:    Patient ID: Tracy Mckenzie, female    DOB: [birth year], 65 y.o.   MRN: [id number]  Chief Complaint  Patient presents with   Hip Pain   Medication Refill    HPI Patient is in today for recheck & medication refills. See A/P for details discussed.   Past Medical History:  Diagnosis Date   Arthritis    Bleeding ulcer    Elevated cholesterol    Hiatal hernia    High blood pressure    Kidney cysts    Liver cyst    Nerve pain    L sciatic pain radiating to R   Peptic ulcer disease     Past Surgical History:  Procedure Laterality Date   APPENDECTOMY  1992   BREAST CYST ASPIRATION Left 2003   KNEE ARTHROSCOPY Right 04/12/2001   torn cartilage   LAPAROSCOPY  1981   for endometriosis   LEG SURGERY Right 11/23/2000   to remove screws   LEG SURGERY Right 02/08/2002   to remove rod   MENISCUS REPAIR  01/29/2003   TIBIA FRACTURE SURGERY Right 06/12/2000   Multiple surgeries   TONSILLECTOMY  1967   TOTAL ABDOMINAL HYSTERECTOMY  1992    Family History  Problem Relation Age of Onset   Kidney disease Mother    COPD Mother    Breast cancer Maternal Aunt    Breast cancer Paternal Aunt    COPD Father    Diabetes Maternal Grandfather    Stroke Paternal Grandfather    Colon cancer Neg Hx     Social History   Tobacco Use   Smoking status: Never   Smokeless tobacco: Never  Substance Use Topics   Alcohol use: No   Drug use: No     Allergies  Allergen Reactions   Meperidine Palpitations and Anaphylaxis    flushed   Ibuprofen Other (See Comments)    Stomach ulcers.   Simvastatin     Other reaction(s): Muscle Pain   Oxycodone-Acetaminophen Itching and Rash   Prolia [Denosumab] Rash   Vicodin [Hydrocodone-Acetaminophen] Itching    Review of Systems NEGATIVE UNLESS OTHERWISE INDICATED IN HPI      Objective:     BP (!) 173/92   Pulse 78   Temp 98.2 F (36.8 C)   Ht 5\' 6"  (1.676 m)   Wt 174 lb 3.2 oz (79 kg)   SpO2 99%   BMI 28.12 kg/m    Wt Readings from Last 3 Encounters:  07/02/21 174 lb 3.2 oz (79 kg)  05/20/21 176 lb 12.8 oz (80.2 kg)  05/10/21 180 lb (81.6 kg)    BP Readings from Last 3 Encounters:  07/02/21 (!) 173/92  05/20/21 136/84  05/10/21 (!) 167/72     Physical Exam Vitals and nursing note reviewed.  Constitutional:      Appearance: Normal appearance. She is normal weight. She is not toxic-appearing.  HENT:     Head: Normocephalic and atraumatic.  Eyes:     Extraocular Movements: Extraocular movements intact.     Conjunctiva/sclera: Conjunctivae normal.     Pupils: Pupils are equal, round, and reactive to light.  Cardiovascular:     Rate and Rhythm: Normal rate and regular rhythm.     Pulses: Normal pulses.     Heart sounds: Normal heart sounds.  Pulmonary:     Effort: Pulmonary effort is normal.     Breath sounds: Normal breath sounds.  Musculoskeletal:     Cervical back: Normal range of  motion and neck supple.     Lumbar back: Spasms and tenderness present. Positive left straight leg raise test. Negative right straight leg raise test.  Skin:    General: Skin is warm and dry.  Neurological:     General: No focal deficit present.     Mental Status: She is alert and oriented to person, place, and time.  Psychiatric:        Mood and Affect: Mood normal.        Behavior: Behavior normal.        Thought Content: Thought content normal.        Judgment: Judgment normal.       Assessment & Plan:   Problem List Items Addressed This Visit       Cardiovascular and Mediastinum   Essential hypertension - Primary   Relevant Orders   CBC with Differential/Platelet (Completed)   Comprehensive metabolic panel (Completed)   Lipid panel (Completed)   VITAMIN D 25 Hydroxy (Vit-D Deficiency, Fractures) (Completed)     Digestive   GERD (gastroesophageal reflux disease)     Other   Hyperlipidemia   Relevant Orders   Lipid panel (Completed)   Back pain   Other Visit Diagnoses      Vitamin D deficiency       Relevant Orders   VITAMIN D 25 Hydroxy (Vit-D Deficiency, Fractures) (Completed)   Leukocytosis, unspecified type       Relevant Orders   CBC with Differential/Platelet (Completed)   Diabetes mellitus screening       Relevant Orders   Comprehensive metabolic panel (Completed)   Hemoglobin A1c (Completed)   Spasm of muscle of lower back       Seasonal allergic rhinitis, unspecified trigger           Right hip / lower back pain:  -Baclofen 10 mg prn at night. Needs refills.  -Gabapentin 300 mg one to two times per day. Just had a refill.  -Ultracet every 8 hours prn - she does not have any refills on this and needs Hydroxyzine with it.  -She has a device at home that helps with her bulging discs and says it usually takes a few weeks and she is ready to start on this again. Works from the bed and says this helps.  -She has done some PT at our office previously and says she doesn't feel like she needs it at this time.   -Previously saw specialist at [facility], not sure she needs to establish care with specialist here just yet.   HTN: -Not to goal, very elevated, pain probably contributing some; no red flag sx's -Losartan 50 mg BID. Needs refill.  -HCTZ 12.5 mg daily.   GERD / hiatal hernia / hx of ulcers:  -Omeprazole 40 mg prn  Allergies: -Xyzal 5 mg BID and Singulair 10 mg once daily. Need refill on Singulair.   Hyperlipidemia: -Cannot tolerate statins -Taking Super Beets OTC   Idiopathic leukocytosis: -Followed by Dr. Lorenso Courier    This note was prepared with assistance of Dragon voice recognition software. Occasional wrong-word or sound-a-like substitutions may have occurred due to the inherent limitations of voice recognition software.  Time Spent: 39 minutes of total time was spent on the date of the encounter performing the following actions: chart review prior to seeing the patient, obtaining history, performing a medically necessary exam,  counseling on the treatment plan, placing orders, and documenting in our EHR.    Tracy Hubers M Willian Donson, PA-C

## 2021-07-03 ENCOUNTER — Other Ambulatory Visit: Payer: Self-pay

## 2021-07-03 MED ORDER — MONTELUKAST SODIUM 10 MG PO TABS: 10.0000 mg | ORAL_TABLET | Freq: Every day | ORAL | 3 refills | Status: DC

## 2021-07-03 MED ORDER — HYDROXYZINE HCL 25 MG PO TABS: 25.0000 mg | ORAL_TABLET | Freq: Four times a day (QID) | ORAL | 0 refills | Status: DC

## 2021-07-03 MED ORDER — TRAMADOL-ACETAMINOPHEN 37.5-325 MG PO TABS: 1.0000 | ORAL_TABLET | Freq: Three times a day (TID) | ORAL | 0 refills | Status: DC | PRN

## 2021-07-03 MED ORDER — OMEPRAZOLE 40 MG PO CPDR: DELAYED_RELEASE_CAPSULE | ORAL | 3 refills | Status: DC

## 2021-07-03 NOTE — Telephone Encounter (Signed)
Patient requesting Ultracet

## 2021-07-06 ENCOUNTER — Other Ambulatory Visit: Payer: Self-pay | Admitting: Physician Assistant

## 2021-07-06 MED ORDER — TRAMADOL-ACETAMINOPHEN 37.5-325 MG PO TABS: 1.0000 | ORAL_TABLET | Freq: Three times a day (TID) | ORAL | 0 refills | Status: DC | PRN

## 2021-07-06 NOTE — Telephone Encounter (Signed)
Rx sent 

## 2021-07-10 NOTE — Telephone Encounter (Signed)
Pt has been rs and apologizes for missing appt

## 2021-07-20 ENCOUNTER — Other Ambulatory Visit: Payer: Self-pay | Admitting: Physician Assistant

## 2021-08-01 ENCOUNTER — Other Ambulatory Visit: Payer: Self-pay

## 2021-08-01 ENCOUNTER — Emergency Department (HOSPITAL_BASED_OUTPATIENT_CLINIC_OR_DEPARTMENT_OTHER)
Admission: EM | Admit: 2021-08-01 | Discharge: 2021-08-01 | Disposition: A | Discharge status: Home / Self Care | Payer: Medicare HMO | Attending: Emergency Medicine | Admitting: Emergency Medicine

## 2021-08-01 ENCOUNTER — Encounter (HOSPITAL_BASED_OUTPATIENT_CLINIC_OR_DEPARTMENT_OTHER): Payer: Self-pay | Admitting: *Deleted

## 2021-08-01 DIAGNOSIS — Z79899 Other long term (current) drug therapy: Secondary | ICD-10-CM | POA: Diagnosis not present

## 2021-08-01 DIAGNOSIS — R112 Nausea with vomiting, unspecified: Secondary | ICD-10-CM | POA: Insufficient documentation

## 2021-08-01 DIAGNOSIS — R197 Diarrhea, unspecified: Secondary | ICD-10-CM | POA: Diagnosis not present

## 2021-08-01 DIAGNOSIS — I10 Essential (primary) hypertension: Secondary | ICD-10-CM | POA: Diagnosis not present

## 2021-08-01 LAB — CBC
HCT: 40.4 % (ref 36.0–46.0)
Hemoglobin: 13.4 g/dL (ref 12.0–15.0)
MCH: 30.5 pg (ref 26.0–34.0)
MCHC: 33.2 g/dL (ref 30.0–36.0)
MCV: 92 fL (ref 80.0–100.0)
Platelets: 307 10*3/uL (ref 150–400)
RBC: 4.39 MIL/uL (ref 3.87–5.11)
RDW: 15.7 % — ABNORMAL HIGH (ref 11.5–15.5)
WBC: 13.8 10*3/uL — ABNORMAL HIGH (ref 4.0–10.5)
nRBC: 0 % (ref 0.0–0.2)

## 2021-08-01 LAB — COMPREHENSIVE METABOLIC PANEL
ALT: 23 U/L (ref 0–44)
AST: 21 U/L (ref 15–41)
Albumin: 4.5 g/dL (ref 3.5–5.0)
Alkaline Phosphatase: 98 U/L (ref 38–126)
Anion gap: 11 (ref 5–15)
BUN: 20 mg/dL (ref 8–23)
CO2: 27 mmol/L (ref 22–32)
Calcium: 9.5 mg/dL (ref 8.9–10.3)
Chloride: 104 mmol/L (ref 98–111)
Creatinine, Ser: 0.96 mg/dL (ref 0.44–1.00)
GFR, Estimated: >60 mL/min (ref >60)
Glucose, Bld: 128 mg/dL — ABNORMAL HIGH (ref 70–99)
Potassium: 3.1 mmol/L — ABNORMAL LOW (ref 3.5–5.1)
Sodium: 142 mmol/L (ref 135–145)
Total Bilirubin: 0.5 mg/dL (ref 0.3–1.2)
Total Protein: 7.3 g/dL (ref 6.5–8.1)

## 2021-08-01 MED ORDER — ONDANSETRON HCL 4 MG/2ML IJ SOLN
4.0000 mg | Freq: Once | INTRAMUSCULAR | Status: AC
  Administered 2021-08-01: 4 mg via INTRAVENOUS
  Filled 2021-08-01: qty 2

## 2021-08-01 MED ORDER — PROMETHAZINE HCL 25 MG RE SUPP: 25.0000 mg | Freq: Four times a day (QID) | RECTAL | 0 refills | Status: DC | PRN

## 2021-08-01 MED ORDER — ONDANSETRON 4 MG PO TBDP: ORAL_TABLET | ORAL | 0 refills | Status: DC

## 2021-08-01 MED ORDER — SODIUM CHLORIDE 0.9 % IV BOLUS
1000.0000 mL | Freq: Once | INTRAVENOUS | Status: AC
  Administered 2021-08-01: 1000 mL via INTRAVENOUS

## 2021-08-01 MED ORDER — METOCLOPRAMIDE HCL 5 MG/ML IJ SOLN
5.0000 mg | Freq: Once | INTRAMUSCULAR | Status: AC
  Administered 2021-08-01: 5 mg via INTRAVENOUS
  Filled 2021-08-01: qty 2

## 2021-08-01 MED ORDER — DIPHENHYDRAMINE HCL 50 MG/ML IJ SOLN
12.5000 mg | Freq: Once | INTRAMUSCULAR | Status: AC
  Administered 2021-08-01: 12.5 mg via INTRAVENOUS
  Filled 2021-08-01: qty 1

## 2021-08-01 NOTE — ED Provider Notes (Signed)
[facility] EMERGENCY DEPT Provider Note   CSN: [id number] Arrival date & time: 08/01/21  1546     History Chief Complaint  Patient presents with   Emesis    Tracy Mckenzie is a 65 y.o. female.  65 yo F with nausea vomiting and diarrhea.  Going on for a couple days now.  She is concerned because she felt like her vomit looks somewhat dark for the last time she did it.  She has a history of gastric ulcer in the past.  She denies any abdominal discomfort.  Has not really been able to eat and drink and is felt a bit lightheaded when she tries to stand up.  Denies any sick contacts.  Denies cough or congestion.  The history is provided by the patient.  Emesis Associated symptoms: diarrhea   Associated symptoms: no arthralgias, no chills, no fever, no headaches and no myalgias   Illness Severity:  Moderate Onset quality:  Gradual Duration:  2 days Timing:  Constant Progression:  Worsening Chronicity:  New Associated symptoms: diarrhea, nausea and vomiting   Associated symptoms: no chest pain, no congestion, no fever, no headaches, no myalgias, no rhinorrhea, no shortness of breath and no wheezing       Past Medical History:  Diagnosis Date   Arthritis    Bleeding ulcer    Elevated cholesterol    Hiatal hernia    High blood pressure    Kidney cysts    Liver cyst    Nerve pain    L sciatic pain radiating to R   Peptic ulcer disease     Patient Active Problem List   Diagnosis Date Noted   Back pain 11/27/2020   Osteopenia 11/27/2020   Sciatica 11/27/2020   Surgical menopause 11/27/2020   Tibia/fibula fracture 11/27/2020   Unilateral primary osteoarthritis, right knee 09/05/2019   Hyperlipidemia 08/15/2019   Essential hypertension 08/13/2019   GERD (gastroesophageal reflux disease) 08/13/2019   Osteoporosis 08/13/2019   Post-traumatic osteoarthritis of right knee 08/13/2019   Hiatal hernia 08/13/2019   Chronic left-sided low back pain without  sciatica 08/13/2019    Past Surgical History:  Procedure Laterality Date   APPENDECTOMY  1992   BREAST CYST ASPIRATION Left 2003   KNEE ARTHROSCOPY Right 04/12/2001   torn cartilage   LAPAROSCOPY  1981   for endometriosis   LEG SURGERY Right 11/23/2000   to remove screws   LEG SURGERY Right 02/08/2002   to remove rod   MENISCUS REPAIR  01/29/2003   TIBIA FRACTURE SURGERY Right 06/12/2000   Multiple surgeries   TONSILLECTOMY  1967   TOTAL ABDOMINAL HYSTERECTOMY  1992     OB History   No obstetric history on file.     Family History  Problem Relation Age of Onset   Kidney disease Mother    COPD Mother    Breast cancer Maternal Aunt    Breast cancer Paternal Aunt    COPD Father    Diabetes Maternal Grandfather    Stroke Paternal Grandfather    Colon cancer Neg Hx     Social History   Tobacco Use   Smoking status: Never   Smokeless tobacco: Never  Vaping Use   Vaping Use: Never used  Substance Use Topics   Alcohol use: No   Drug use: No    Home Medications Prior to Admission medications   Medication Sig Start Date End Date Taking? Authorizing Provider  albuterol (VENTOLIN HFA) 108 (90 Base) MCG/ACT inhaler Inhale 2  puffs into the lungs every 6 (six) hours as needed for wheezing or shortness of breath. 11/27/20   Orma Flaming, MD  azelastine (ASTELIN) 0.1 % nasal spray Place 1 spray into both nostrils 2 (two) times daily as needed for rhinitis or allergies. 11/27/20   Orma Flaming, MD  baclofen (LIORESAL) 10 MG tablet TAKE ONE TABLET BY MOUTH THREE TIMES A DAY 07/01/21   Allwardt, Alyssa M, PA-C  chlorpheniramine-HYDROcodone (TUSSIONEX PENNKINETIC ER) 10-8 MG/5ML SUER Take 5 mLs by mouth every 12 (twelve) hours as needed for cough. 05/10/21   Isla Pence, MD  Coenzyme Q10 (CO Q-10) 100 MG CAPS Take 100 mg by mouth daily.    [provider]  fluticasone (CUTIVATE) 0.05 % cream Apply 1 application topically as needed (rash). 06/12/20   [provider]  gabapentin (NEURONTIN) 300 MG capsule TAKE ONE CAPSULE BY MOUTH TWICE A DAY 07/20/21   Allwardt, Alyssa M, PA-C  hydrochlorothiazide (MICROZIDE) 12.5 MG capsule Take 1 capsule (12.5 mg total) by mouth daily. 11/03/20   Orma Flaming, MD  hydrOXYzine (ATARAX) 25 MG tablet Take 1 tablet (25 mg total) by mouth every 6 (six) hours. 07/03/21   Allwardt, Randa Evens, PA-C  levocetirizine (XYZAL) 5 MG tablet Take 5 mg by mouth 2 (two) times daily.    [provider]  losartan (COZAAR) 50 MG tablet TAKE ONE TABLET BY MOUTH TWICE A DAY FOR FOR BLOOD PRESSURE 04/10/21   Allwardt, Alyssa M, PA-C  magnesium 30 MG tablet Take 90 mg by mouth daily.    [provider]  metroNIDAZOLE (METROCREAM) 0.75 % cream Apply 1 application topically 2 (two) times daily. 06/12/20   [provider]  montelukast (SINGULAIR) 10 MG tablet Take 1 tablet (10 mg total) by mouth at bedtime. 07/03/21   Allwardt, Randa Evens, PA-C  Multiple Vitamins-Minerals (PRESERVISION AREDS PO) Take 1 tablet by mouth daily.    [provider]  nitroGLYCERIN (NITROSTAT) 0.4 MG SL tablet Place 1 tablet (0.4 mg total) under the tongue every 5 (five) minutes as needed for chest pain. 11/27/20   Orma Flaming, MD  omeprazole (PRILOSEC) 40 MG capsule TAKE ONE CAPSULE BY MOUTH DAILY 30 MINUTES BEFORE BREAKFAST 07/03/21   Allwardt, Randa Evens, PA-C  ondansetron (ZOFRAN-ODT) 4 MG disintegrating tablet 4mg  ODT q4 hours prn nausea/vomit 08/01/21   Deno Etienne, DO  Potassium 99 MG TABS Take 99 mg by mouth daily.    [provider]  promethazine (PHENERGAN) 25 MG suppository Place 1 suppository (25 mg total) rectally every 6 (six) hours as needed for nausea or vomiting. 08/01/21   Deno Etienne, DO  QUERCETIN PO Take 1 tablet by mouth daily.    [provider]  Spacer/Aero-Holding Chambers (PRO COMFORT SPACER ADULT) MISC 1 Device by Does not apply route as needed. 03/07/20   Orma Flaming, MD  SUPER B COMPLEX/C  PO Take 1 tablet by mouth daily.    [provider]  tacrolimus (PROTOPIC) 0.1 % ointment Apply 1 application topically as needed (rash). 08/04/20   [provider]  traMADol-acetaminophen (ULTRACET) 37.5-325 MG tablet Take 1 tablet by mouth every 8 (eight) hours as needed. 07/06/21   Allwardt, Randa Evens, PA-C  zinc gluconate 50 MG tablet Take 50 mg by mouth daily.    [provider]    Allergies    Meperidine, Ibuprofen, Simvastatin, Oxycodone-acetaminophen, Prolia [denosumab], and Vicodin [hydrocodone-acetaminophen]  Review of Systems   Review of Systems  Constitutional:  Negative for chills and fever.  HENT:  Negative for congestion and rhinorrhea.   Eyes:  Negative for redness and visual disturbance.  Respiratory:  Negative for shortness of breath and wheezing.   Cardiovascular:  Negative for chest pain and palpitations.  Gastrointestinal:  Positive for diarrhea, nausea and vomiting.  Genitourinary:  Negative for dysuria and urgency.  Musculoskeletal:  Negative for arthralgias and myalgias.  Skin:  Negative for pallor and wound.  Neurological:  Negative for dizziness and headaches.   Physical Exam Updated Vital Signs BP 128/71    Pulse 94    Temp 100 F (37.8 C) (Oral)    Resp 19    Ht 5\' 6"  (1.676 m)    Wt 77.1 kg    SpO2 98%    BMI 27.44 kg/m   Physical Exam Vitals and nursing note reviewed.  Constitutional:      General: She is not in acute distress.    Appearance: She is well-developed. She is not diaphoretic.  HENT:     Head: Normocephalic and atraumatic.  Eyes:     Pupils: Pupils are equal, round, and reactive to light.  Cardiovascular:     Rate and Rhythm: Normal rate and regular rhythm.     Heart sounds: No murmur heard.   No friction rub. No gallop.  Pulmonary:     Effort: Pulmonary effort is normal.     Breath sounds: No wheezing or rales.  Abdominal:     General: There is no distension.     Palpations: Abdomen is soft.      Tenderness: There is no abdominal tenderness.  Musculoskeletal:        General: No tenderness.     Cervical back: Normal range of motion and neck supple.  Skin:    General: Skin is warm and dry.  Neurological:     Mental Status: She is alert and oriented to person, place, and time.  Psychiatric:        Behavior: Behavior normal.    ED Results / Procedures / Treatments   Labs (all labs ordered are listed, but only abnormal results are displayed) Labs Reviewed  COMPREHENSIVE METABOLIC PANEL - Abnormal; Notable for the following components:      Result Value   Potassium 3.1 (*)    Glucose, Bld 128 (*)    All other components within normal limits  CBC - Abnormal; Notable for the following components:   WBC 13.8 (*)    RDW 15.7 (*)    All other components within normal limits    EKG None  Radiology No results found.  Procedures Procedures   Medications Ordered in ED Medications  ondansetron (ZOFRAN) injection 4 mg (4 mg Intravenous Given 08/01/21 1707)  sodium chloride 0.9 % bolus 1,000 mL (0 mLs Intravenous Stopped 08/01/21 1921)  metoCLOPramide (REGLAN) injection 5 mg (5 mg Intravenous Given 08/01/21 1804)  diphenhydrAMINE (BENADRYL) injection 12.5 mg (12.5 mg Intravenous Given 08/01/21 1803)    ED Course  I have reviewed the triage vital signs and the nursing notes.  Pertinent labs & imaging results that were available during my care of the patient were reviewed by me and considered in my medical decision making (see chart for details).    MDM Rules/Calculators/A&P                         65 yo F with a chief complaints of nausea vomiting and diarrhea.  This been going on for a couple days now.  She  is concerned that she might have an upper GI bleed.  No vomiting while in the ED.  Has had some improvement with Zofran.  We will give a bolus of IV fluids Reglan and Benadryl.  Her hemoglobin is actually higher than it has been.  Lab work more consistent with dehydration  and acute blood loss.  We will reassess.  Patient feeling better on reassessment.  Tolerating by mouth.  Will discharge home.  9:49 PM:  I have discussed the diagnosis/risks/treatment options with the patient and family and believe the pt to be eligible for discharge home to follow-up with PCP. We also discussed returning to the ED immediately if new or worsening sx occur. We discussed the sx which are most concerning (e.g., sudden worsening pain, fever, inability to tolerate by mouth) that necessitate immediate return. Medications administered to the patient during their visit and any new prescriptions provided to the patient are listed below.  Medications given during this visit Medications  ondansetron (ZOFRAN) injection 4 mg (4 mg Intravenous Given 08/01/21 1707)  sodium chloride 0.9 % bolus 1,000 mL (0 mLs Intravenous Stopped 08/01/21 1921)  metoCLOPramide (REGLAN) injection 5 mg (5 mg Intravenous Given 08/01/21 1804)  diphenhydrAMINE (BENADRYL) injection 12.5 mg (12.5 mg Intravenous Given 08/01/21 1803)     The patient appears reasonably screen and/or stabilized for discharge and I doubt any other medical condition or other [facility] requiring further screening, evaluation, or treatment in the ED at this time prior to discharge.      Final Clinical Impression(s) / ED Diagnoses Final diagnoses:  Nausea vomiting and diarrhea    Rx / DC Orders ED Discharge Orders          Ordered    ondansetron (ZOFRAN-ODT) 4 MG disintegrating tablet  Status:  Discontinued        08/01/21 1954    promethazine (PHENERGAN) 25 MG suppository  Every 6 hours PRN,   Status:  Discontinued        08/01/21 1954    ondansetron (ZOFRAN-ODT) 4 MG disintegrating tablet  Status:  Discontinued        08/01/21 2002    promethazine (PHENERGAN) 25 MG suppository  Every 6 hours PRN,   Status:  Discontinued        08/01/21 2002    ondansetron (ZOFRAN-ODT) 4 MG disintegrating tablet        08/01/21 2014     promethazine (PHENERGAN) 25 MG suppository  Every 6 hours PRN        08/01/21 2014             Deno Etienne, DO 08/01/21 2149

## 2021-08-01 NOTE — Discharge Instructions (Signed)
You can take Imodium for diarrhea.  Please return for abdominal discomfort inability to eat or drink.  Please follow-up with your family doctor and GI doctor in the office.

## 2021-08-01 NOTE — ED Triage Notes (Signed)
Vomiting since yesterday.  Has history of peptic ulcer.  Black vomitus this afternoon around 2pm.  Vomited 3-4 times.

## 2021-08-06 ENCOUNTER — Ambulatory Visit (INDEPENDENT_AMBULATORY_CARE_PROVIDER_SITE_OTHER): Payer: Medicare HMO | Admitting: *Deleted

## 2021-08-06 DIAGNOSIS — K219 Gastro-esophageal reflux disease without esophagitis: Secondary | ICD-10-CM

## 2021-08-06 DIAGNOSIS — E559 Vitamin D deficiency, unspecified: Secondary | ICD-10-CM

## 2021-08-06 DIAGNOSIS — I10 Essential (primary) hypertension: Secondary | ICD-10-CM

## 2021-08-06 NOTE — Chronic Care Management (AMB) (Signed)
Chronic Care Management   CCM RN Visit Note  08/06/2021 Name: Tracy Mckenzie MRN: [id number] DOB: 04-16-1956  Subjective: Tracy Mckenzie is a 66 y.o. year old female who is a primary care patient of Allwardt, Alyssa M, PA-C. The care management team was consulted for assistance with disease management and care coordination needs.    Engaged with patient by telephone for follow up visit in response to provider referral for case management and/or care coordination services.   Consent to Services:  The patient was given information about Chronic Care Management services, agreed to services, and gave verbal consent prior to initiation of services.  Please see initial visit note for detailed documentation.   Patient agreed to services and verbal consent obtained.   Assessment: Review of patient past medical history, allergies, medications, health status, including review of consultants reports, laboratory and other test data, was performed as part of comprehensive evaluation and provision of chronic care management services.   SDOH (Social Determinants of Health) assessments and interventions performed:    CCM Care Plan  Allergies  Allergen Reactions   Meperidine Palpitations and Anaphylaxis    flushed   Ibuprofen Other (See Comments)    Stomach ulcers.   Simvastatin     Other reaction(s): Muscle Pain   Oxycodone-Acetaminophen Itching and Rash   Prolia [Denosumab] Rash   Vicodin [Hydrocodone-Acetaminophen] Itching    Outpatient Encounter Medications as of 08/06/2021  Medication Sig   albuterol (VENTOLIN HFA) 108 (90 Base) MCG/ACT inhaler Inhale 2 puffs into the lungs every 6 (six) hours as needed for wheezing or shortness of breath.   azelastine (ASTELIN) 0.1 % nasal spray Place 1 spray into both nostrils 2 (two) times daily as needed for rhinitis or allergies.   baclofen (LIORESAL) 10 MG tablet TAKE ONE TABLET BY MOUTH THREE TIMES A DAY   chlorpheniramine-HYDROcodone (TUSSIONEX  PENNKINETIC ER) 10-8 MG/5ML SUER Take 5 mLs by mouth every 12 (twelve) hours as needed for cough.   Coenzyme Q10 (CO Q-10) 100 MG CAPS Take 100 mg by mouth daily.   fluticasone (CUTIVATE) 0.05 % cream Apply 1 application topically as needed (rash).   gabapentin (NEURONTIN) 300 MG capsule TAKE ONE CAPSULE BY MOUTH TWICE A DAY   hydrochlorothiazide (MICROZIDE) 12.5 MG capsule Take 1 capsule (12.5 mg total) by mouth daily.   hydrOXYzine (ATARAX) 25 MG tablet Take 1 tablet (25 mg total) by mouth every 6 (six) hours.   levocetirizine (XYZAL) 5 MG tablet Take 5 mg by mouth 2 (two) times daily.   losartan (COZAAR) 50 MG tablet TAKE ONE TABLET BY MOUTH TWICE A DAY FOR FOR BLOOD PRESSURE   magnesium 30 MG tablet Take 90 mg by mouth daily.   metroNIDAZOLE (METROCREAM) 0.75 % cream Apply 1 application topically 2 (two) times daily.   montelukast (SINGULAIR) 10 MG tablet Take 1 tablet (10 mg total) by mouth at bedtime.   Multiple Vitamins-Minerals (PRESERVISION AREDS PO) Take 1 tablet by mouth daily.   nitroGLYCERIN (NITROSTAT) 0.4 MG SL tablet Place 1 tablet (0.4 mg total) under the tongue every 5 (five) minutes as needed for chest pain.   omeprazole (PRILOSEC) 40 MG capsule TAKE ONE CAPSULE BY MOUTH DAILY 30 MINUTES BEFORE BREAKFAST   ondansetron (ZOFRAN-ODT) 4 MG disintegrating tablet 83m ODT q4 hours prn nausea/vomit   Potassium 99 MG TABS Take 99 mg by mouth daily.   promethazine (PHENERGAN) 25 MG suppository Place 1 suppository (25 mg total) rectally every 6 (six) hours as needed for nausea or vomiting.  QUERCETIN PO Take 1 tablet by mouth daily.   Spacer/Aero-Holding Chambers (PRO COMFORT SPACER ADULT) MISC 1 Device by Does not apply route as needed.   SUPER B COMPLEX/C PO Take 1 tablet by mouth daily.   tacrolimus (PROTOPIC) 0.1 % ointment Apply 1 application topically as needed (rash).   traMADol-acetaminophen (ULTRACET) 37.5-325 MG tablet Take 1 tablet by mouth every 8 (eight) hours as needed.    zinc gluconate 50 MG tablet Take 50 mg by mouth daily.   Facility-Administered Encounter Medications as of 08/06/2021  Medication   0.9 %  sodium chloride infusion    Patient Active Problem List   Diagnosis Date Noted   Back pain 11/27/2020   Osteopenia 11/27/2020   Sciatica 11/27/2020   Surgical menopause 11/27/2020   Tibia/fibula fracture 11/27/2020   Unilateral primary osteoarthritis, right knee 09/05/2019   Hyperlipidemia 08/15/2019   Essential hypertension 08/13/2019   GERD (gastroesophageal reflux disease) 08/13/2019   Osteoporosis 08/13/2019   Post-traumatic osteoarthritis of right knee 08/13/2019   Hiatal hernia 08/13/2019   Chronic left-sided low back pain without sciatica 08/13/2019    Conditions to be addressed/monitored:HTN and HLD  Care Plan : [place]  Updates made by Leona Singleton, RN since 08/06/2021 12:00 AM     Problem: Knowledge deficit related to self care management of hypertension and hyperlipidemia   Priority: Medium     Long-Range Goal: Patient will work with the CCM team to better manager hypertension and hyperlipidemia   Start Date: 04/02/2021  Expected End Date: 10/30/2021  Recent Progress: Not on track  Priority: Medium  Note:   Current Barriers:  Knowledge Deficits related to plan of care for management of HTN and HLD  patient reporting normally checking blood pressures a few times a week with normal ranges of 140/70's.  Continues to check pressures a few times a week.  States it has been running normal, unsure of exact numbers and machine is on charger right now.  Does endorse ED visit on 12/31 related to black emesis with history of ulcers.  States there has been stomach bug infecting the entire home.  Reports she is able to stay hydrated, has no appetite, tolerating and keeping down soup.  Is taking Zofran  RNCM Clinical Goal(s):  Patient will verbalize understanding of plan for management of HTN and HLD continue to work with RN Care  Manager to address care management and care coordination needs related to HTN and HLD  through collaboration with RN Care manager, provider, and care team.   Interventions: 1:1 collaboration with primary care provider regarding development and update of comprehensive plan of care as evidenced by provider attestation and co-signature Inter-disciplinary care team collaboration (see longitudinal plan of care) Evaluation of current treatment plan related to  self management and patient's adherence to plan as established by provider   Hyperlipidemia:  (Status: Goal on Track (progressing): YES. Condition stable. Not addressed this visit.) Lab Results  Component Value Date   CHOL 187 07/02/2021   HDL 57.10 07/02/2021   LDLCALC 109 (H) 07/02/2021   TRIG 105.0 07/02/2021   CHOLHDL 3 07/02/2021     Medication review performed; medication list updated in electronic medical record.  Provider established cholesterol goals reviewed; Counseled on importance of regular laboratory monitoring as prescribed; Provided HLD educational materials;  Hypertension: (Status: New goal. Goal on track: NO.) Last practice recorded BP readings:  BP Readings from Last 3 Encounters:  08/01/21 128/71  07/02/21 (!) 173/92  05/20/21 136/84  Most recent eGFR/CrCl: No results found for: EGFR  No components found for: CRCL  Evaluation of current treatment plan related to hypertension self management and patient's adherence to plan as established by provider;   Reviewed prescribed diet low salt low fat low cholesterol Reviewed medications with patient and discussed importance of compliance;  Discussed plans with patient for ongoing care management follow up and provided patient with direct contact information for care management team; Advised patient, providing education and rationale, to monitor blood pressure daily and record, calling PCP for findings outside established parameters;  Discussed complications of poorly  controlled blood pressure such as heart disease, stroke, circulatory complications, vision complications, kidney impairment, sexual dysfunction;  Make sure to stay hydrated; continue to take Zofran for nausea and vomiting Contact GI for apointment as soon as possible or primary care if not able to be seen by GI Monitor for blood in stool or emesis and notify provider as soon as possible if occurs (ED if necessary)  Patient Goals/Self-Care Activities: Patient will self administer medications as prescribed Patient will attend all scheduled provider appointments Patient will call pharmacy for medication refills Patient will call provider office for new concerns or questions Check blood pressure 3 times per week Write blood pressure results in a log and take to provider for review Low salt low fat low cholesterol diet Increase activity as tolerated Check blood pressure 3 times per week Write blood pressure results in a log and take to provider for review Low salt low fat low cholesterol diet Increase activity as tolerated Contact PCP or GI for follow up ASAP Stay hydrated     Plan:The care management team will reach out to the patient again over the next 30 days.  Hubert Azure RN, MSN RN Care Management Coordinator  [place] [phone] [email or web address]

## 2021-08-06 NOTE — Patient Instructions (Addendum)
Visit Information  Thank you for taking time to visit with me today. Please don't hesitate to contact me if I can be of assistance to you before our next scheduled telephone appointment.  Following are the goals we discussed today:  (Patient will self administer medications as prescribed Patient will attend all scheduled provider appointments Patient will call pharmacy for medication refills Patient will call provider office for new concerns or questions Check blood pressure 3 times per week Write blood pressure results in a log and take to provider for review Low salt low fat low cholesterol diet Increase activity as tolerated Check blood pressure 3 times per week Write blood pressure results in a log and take to provider for review Low salt low fat low cholesterol diet Increase activity as tolerated Contact PCP or GI for follow up ASAP Stay hydrated  Our next appointment is by telephone on 1/26 at 0945  Please call the care guide team at [phone] if you need to cancel or reschedule your appointment.   If you are experiencing a Mental Health or [place] or need someone to talk to, please call the Suicide and Crisis Lifeline: 988 call the [place] National Suicide Prevention Lifeline: (415)366-3118 or TTY: 5011808590 TTY 956-638-4577) to talk to a trained counselor call 1-800-273-TALK (toll free, 24 hour hotline) go to [facility] [address], [place] [phone]) call 911   Patient verbalizes understanding of instructions provided today and agrees to view in [place].   Hubert Azure RN, MSN RN Care Management Coordinator  [facility] [phone] [email or web address]

## 2021-08-07 ENCOUNTER — Other Ambulatory Visit: Payer: Self-pay | Admitting: Physician Assistant

## 2021-08-20 ENCOUNTER — Other Ambulatory Visit: Payer: Self-pay | Admitting: Physician Assistant

## 2021-08-27 ENCOUNTER — Ambulatory Visit: Payer: Medicare HMO | Admitting: *Deleted

## 2021-08-27 DIAGNOSIS — I10 Essential (primary) hypertension: Secondary | ICD-10-CM

## 2021-08-27 DIAGNOSIS — E782 Mixed hyperlipidemia: Secondary | ICD-10-CM

## 2021-08-27 NOTE — Patient Instructions (Addendum)
Visit Information  Thank you for taking time to visit with me today. Please don't hesitate to contact me if I can be of assistance to you before our next scheduled telephone appointment.  Following are the goals we discussed today:  Patient will self administer medications as prescribed Patient will attend all scheduled provider appointments Patient will call pharmacy for medication refills Patient will call provider office for new concerns or questions Check blood pressure 3 times per week Write blood pressure results in a log and take to provider for review Low salt low fat low cholesterol diet Increase activity as tolerated Check blood pressure 3 times per week Write blood pressure results in a log and take to provider for review Low salt low fat low cholesterol diet Increase activity as tolerated Contact PCP for continued elevated blood pressures Stay hydrated  Our next appointment is by telephone on 3/9 at 0945  Please call the care guide team at [phone] if you need to cancel or reschedule your appointment.   If you are experiencing a Mental Health or [place] or need someone to talk to, please call the Suicide and Crisis Lifeline: 988 call the [place] National Suicide Prevention Lifeline: 810-760-6599 or TTY: 250-038-1517 TTY (719)310-5146) to talk to a trained counselor call 1-800-273-TALK (toll free, 24 hour hotline) go to [facility] [address], [place] [phone]) call 911   Patient verbalizes understanding of instructions and care plan provided today and agrees to view in [place]. Active MyChart status confirmed with patient.    Hubert Azure RN, MSN RN Care Management Coordinator  [place] [phone] [email or web address]

## 2021-08-27 NOTE — Chronic Care Management (AMB) (Signed)
Chronic Care Management   CCM RN Visit Note  08/27/2021 Name: Tracy Mckenzie MRN: [id number] DOB: 10-21-1955  Subjective: Tracy Mckenzie is a 65 y.o. year old female who is a primary care patient of Allwardt, Alyssa M, PA-C. The care management team was consulted for assistance with disease management and care coordination needs.    Engaged with patient by telephone for follow up visit in response to provider referral for case management and/or care coordination services.   Consent to Services:  The patient was given information about Chronic Care Management services, agreed to services, and gave verbal consent prior to initiation of services.  Please see initial visit note for detailed documentation.   Patient agreed to services and verbal consent obtained.   Assessment: Review of patient past medical history, allergies, medications, health status, including review of consultants reports, laboratory and other test data, was performed as part of comprehensive evaluation and provision of chronic care management services.   SDOH (Social Determinants of Health) assessments and interventions performed:    CCM Care Plan  Allergies  Allergen Reactions   Meperidine Palpitations and Anaphylaxis    flushed   Ibuprofen Other (See Comments)    Stomach ulcers.   Simvastatin     Other reaction(s): Muscle Pain   Oxycodone-Acetaminophen Itching and Rash   Prolia [Denosumab] Rash   Vicodin [Hydrocodone-Acetaminophen] Itching    Outpatient Encounter Medications as of 08/27/2021  Medication Sig   gabapentin (NEURONTIN) 300 MG capsule TAKE ONE CAPSULE BY MOUTH TWICE A DAY   albuterol (VENTOLIN HFA) 108 (90 Base) MCG/ACT inhaler Inhale 2 puffs into the lungs every 6 (six) hours as needed for wheezing or shortness of breath.   azelastine (ASTELIN) 0.1 % nasal spray Place 1 spray into both nostrils 2 (two) times daily as needed for rhinitis or allergies.   baclofen (LIORESAL) 10 MG tablet TAKE ONE  TABLET BY MOUTH THREE TIMES A DAY   chlorpheniramine-HYDROcodone (TUSSIONEX PENNKINETIC ER) 10-8 MG/5ML SUER Take 5 mLs by mouth every 12 (twelve) hours as needed for cough.   Coenzyme Q10 (CO Q-10) 100 MG CAPS Take 100 mg by mouth daily.   fluticasone (CUTIVATE) 0.05 % cream Apply 1 application topically as needed (rash).   hydrochlorothiazide (MICROZIDE) 12.5 MG capsule Take 1 capsule (12.5 mg total) by mouth daily.   hydrOXYzine (ATARAX) 25 MG tablet Take 1 tablet (25 mg total) by mouth every 6 (six) hours.   levocetirizine (XYZAL) 5 MG tablet Take 5 mg by mouth 2 (two) times daily.   losartan (COZAAR) 50 MG tablet TAKE ONE TABLET BY MOUTH TWICE A DAY FOR FOR BLOOD PRESSURE   magnesium 30 MG tablet Take 90 mg by mouth daily.   metroNIDAZOLE (METROCREAM) 0.75 % cream Apply 1 application topically 2 (two) times daily.   montelukast (SINGULAIR) 10 MG tablet Take 1 tablet (10 mg total) by mouth at bedtime.   Multiple Vitamins-Minerals (PRESERVISION AREDS PO) Take 1 tablet by mouth daily.   nitroGLYCERIN (NITROSTAT) 0.4 MG SL tablet Place 1 tablet (0.4 mg total) under the tongue every 5 (five) minutes as needed for chest pain.   omeprazole (PRILOSEC) 40 MG capsule TAKE ONE CAPSULE BY MOUTH DAILY 30 MINUTES BEFORE BREAKFAST   ondansetron (ZOFRAN-ODT) 4 MG disintegrating tablet 93m ODT q4 hours prn nausea/vomit   Potassium 99 MG TABS Take 99 mg by mouth daily.   promethazine (PHENERGAN) 25 MG suppository Place 1 suppository (25 mg total) rectally every 6 (six) hours as needed for nausea or vomiting.  QUERCETIN PO Take 1 tablet by mouth daily.   Spacer/Aero-Holding Chambers (PRO COMFORT SPACER ADULT) MISC 1 Device by Does not apply route as needed.   SUPER B COMPLEX/C PO Take 1 tablet by mouth daily.   tacrolimus (PROTOPIC) 0.1 % ointment Apply 1 application topically as needed (rash).   traMADol-acetaminophen (ULTRACET) 37.5-325 MG tablet Take 1 tablet by mouth every 8 (eight) hours as needed.    zinc gluconate 50 MG tablet Take 50 mg by mouth daily.   Facility-Administered Encounter Medications as of 08/27/2021  Medication   0.9 %  sodium chloride infusion    Patient Active Problem List   Diagnosis Date Noted   Back pain 11/27/2020   Osteopenia 11/27/2020   Sciatica 11/27/2020   Surgical menopause 11/27/2020   Tibia/fibula fracture 11/27/2020   Unilateral primary osteoarthritis, right knee 09/05/2019   Hyperlipidemia 08/15/2019   Essential hypertension 08/13/2019   GERD (gastroesophageal reflux disease) 08/13/2019   Osteoporosis 08/13/2019   Post-traumatic osteoarthritis of right knee 08/13/2019   Hiatal hernia 08/13/2019   Chronic left-sided low back pain without sciatica 08/13/2019    Conditions to be addressed/monitored:HTN and COPD  Care Plan : [place]  Updates made by Leona Singleton, RN since 08/27/2021 12:00 AM     Problem: Knowledge deficit related to self care management of hypertension and hyperlipidemia   Priority: Medium     Long-Range Goal: Patient will work with the CCM team to better manager hypertension and hyperlipidemia   Start Date: 04/02/2021  Expected End Date: 10/30/2021  Recent Progress: Not on track  Priority: Medium  Note:   Current Barriers:  Knowledge Deficits related to plan of care for management of HTN and HLD  patient reporting normally checking blood pressures a few times a week with normal ranges of 140/70's.  Continues to check pressures a few times a week.  States it has been running normal, unsure of exact numbers and machine is on charger right now.  Does endorse ED visit on 12/31 related to black emesis with history of ulcers.  States there has been stomach bug infecting the entire home.  Reports she is able to stay hydrated, has no appetite, tolerating and keeping down soup.  Is taking Zofran  1/26--Patient stating she feels much better. N/V/D is resolved and patient able to keep food, liquids and meds down.  Feels   back to normal self.  Does report blood pressure was elevated, especially when not ado keep medications down; states it is better but does not remember exact number.   RNCM Clinical Goal(s):  Patient will verbalize understanding of plan for management of HTN and HLD continue to work with RN Care Manager to address care management and care coordination needs related to HTN and HLD  through collaboration with RN Care manager, provider, and care team.   Interventions: 1:1 collaboration with primary care provider regarding development and update of comprehensive plan of care as evidenced by provider attestation and co-signature Inter-disciplinary care team collaboration (see longitudinal plan of care) Evaluation of current treatment plan related to  self management and patient's adherence to plan as established by provider   Hyperlipidemia:  (Status: Goal on Track (progressing): YES. Condition stable. Not addressed this visit.) Lab Results  Component Value Date   CHOL 187 07/02/2021   HDL 57.10 07/02/2021   LDLCALC 109 (H) 07/02/2021   TRIG 105.0 07/02/2021   CHOLHDL 3 07/02/2021     Medication review performed; medication list updated in electronic  medical record.  Provider established cholesterol goals reviewed; Counseled on importance of regular laboratory monitoring as prescribed; Provided HLD educational materials;  Hypertension: (Status: Goal on track: NO.) Last practice recorded BP readings:  BP Readings from Last 3 Encounters:  08/01/21 128/71  07/02/21 (!) 173/92  05/20/21 136/84  Most recent eGFR/CrCl: No results found for: EGFR  No components found for: CRCL  Evaluation of current treatment plan related to hypertension self management and patient's adherence to plan as established by provider;   Reviewed prescribed diet low salt low fat low cholesterol Reviewed medications with patient and discussed importance of compliance;  Discussed plans with patient for ongoing  care management follow up and provided patient with direct contact information for care management team; Advised patient, providing education and rationale, to monitor blood pressure daily and record, calling PCP for findings outside established parameters;  Discussed complications of poorly controlled blood pressure such as heart disease, stroke, circulatory complications, vision complications, kidney impairment, sexual dysfunction;  Make sure to stay hydrated; continue to take Zofran for nausea and vomiting Contact PCP for elevated blood pressures  Patient Goals/Self-Care Activities: Patient will self administer medications as prescribed Patient will attend all scheduled provider appointments Patient will call pharmacy for medication refills Patient will call provider office for new concerns or questions Check blood pressure 3 times per week Write blood pressure results in a log and take to provider for review Low salt low fat low cholesterol diet Increase activity as tolerated Check blood pressure 3 times per week Write blood pressure results in a log and take to provider for review Low salt low fat low cholesterol diet Increase activity as tolerated Contact PCP for continued elevated blood pressures Stay hydrated     Plan:The care management team will reach out to the patient again over the next 45 days.  Hubert Azure RN, MSN RN Care Management Coordinator  [facility] [phone] [email or web address]

## 2021-09-01 DIAGNOSIS — I10 Essential (primary) hypertension: Secondary | ICD-10-CM

## 2021-09-01 DIAGNOSIS — E785 Hyperlipidemia, unspecified: Secondary | ICD-10-CM | POA: Diagnosis not present

## 2021-09-17 ENCOUNTER — Encounter: Payer: Self-pay | Admitting: Physician Assistant

## 2021-09-17 ENCOUNTER — Telehealth (INDEPENDENT_AMBULATORY_CARE_PROVIDER_SITE_OTHER): Payer: Medicare HMO | Admitting: Family Medicine

## 2021-09-17 DIAGNOSIS — U071 COVID-19: Secondary | ICD-10-CM

## 2021-09-17 MED ORDER — NIRMATRELVIR/RITONAVIR (PAXLOVID)TABLET: 3.0000 | ORAL_TABLET | Freq: Two times a day (BID) | ORAL | 0 refills | Status: AC

## 2021-09-17 MED ORDER — BENZONATATE 100 MG PO CAPS: ORAL_CAPSULE | ORAL | 0 refills | Status: DC

## 2021-09-17 NOTE — Progress Notes (Signed)
Virtual Visit via Video Note  I connected with Tracy Mckenzie  on 09/17/21 at  1:20 PM EST by a video enabled telemedicine application and verified that I am speaking with the correct person using two identifiers.  Location patient: [place] Location provider:work or home office Persons participating in the virtual visit: patient, provider  I discussed the limitations and requested verbal permission for telemedicine visit. The patient expressed understanding and agreed to proceed.   HPI:  Acute telemedicine visit for Covid19: -Onset: 2 days ago, tested positive yesterday -Symptoms include:low grade fever, nasal congestion, cough, nausea yesterday -had exposure over the weekend -Denies:CP, SOB, vomiting, diarrhea -Pertinent past medical history: see below, GFR was > 60 12/22 -Pertinent medication allergies: Allergies  Allergen Reactions   Meperidine Palpitations and Anaphylaxis    flushed   Ibuprofen Other (See Comments)    Stomach ulcers.   Simvastatin     Other reaction(s): Muscle Pain   Oxycodone-Acetaminophen Itching and Rash   Prolia [Denosumab] Rash   Vicodin [Hydrocodone-Acetaminophen] Itching  -COVID-19 vaccine status: had 2 doses and 1 booster Immunization History  Administered Date(s) Administered   Fluad Quad(high Dose 65+) 05/20/2021   Influenza, High Dose Seasonal PF 05/10/2019   Influenza-Unspecified 05/30/2015, 08/01/2017   Moderna Sars-Covid-2 Vaccination 09/15/2019, 10/13/2019   Pneumococcal Polysaccharide-23 07/11/2019   Tdap 05/02/2014   Zoster, Live 05/31/2015, 06/06/2015    ROS: See pertinent positives and negatives per HPI.  Past Medical History:  Diagnosis Date   Arthritis    Bleeding ulcer    Elevated cholesterol    Hiatal hernia    High blood pressure    Kidney cysts    Liver cyst    Nerve pain    L sciatic pain radiating to R   Peptic ulcer disease     Past Surgical History:  Procedure Laterality Date   APPENDECTOMY  1992   BREAST CYST ASPIRATION  Left 2003   KNEE ARTHROSCOPY Right 04/12/2001   torn cartilage   LAPAROSCOPY  1981   for endometriosis   LEG SURGERY Right 11/23/2000   to remove screws   LEG SURGERY Right 02/08/2002   to remove rod   MENISCUS REPAIR  01/29/2003   TIBIA FRACTURE SURGERY Right 06/12/2000   Multiple surgeries   TONSILLECTOMY  1967   TOTAL ABDOMINAL HYSTERECTOMY  1992     Current Outpatient Medications:    gabapentin (NEURONTIN) 300 MG capsule, TAKE ONE CAPSULE BY MOUTH TWICE A DAY, Disp: 60 capsule, Rfl: 0   albuterol (VENTOLIN HFA) 108 (90 Base) MCG/ACT inhaler, Inhale 2 puffs into the lungs every 6 (six) hours as needed for wheezing or shortness of breath., Disp: 18 g, Rfl: 1   azelastine (ASTELIN) 0.1 % nasal spray, Place 1 spray into both nostrils 2 (two) times daily as needed for rhinitis or allergies., Disp: 30 mL, Rfl: 5   baclofen (LIORESAL) 10 MG tablet, TAKE ONE TABLET BY MOUTH THREE TIMES A DAY, Disp: 60 tablet, Rfl: 1   chlorpheniramine-HYDROcodone (TUSSIONEX PENNKINETIC ER) 10-8 MG/5ML SUER, Take 5 mLs by mouth every 12 (twelve) hours as needed for cough., Disp: 140 mL, Rfl: 0   Coenzyme Q10 (CO Q-10) 100 MG CAPS, Take 100 mg by mouth daily., Disp: , Rfl:    fluticasone (CUTIVATE) 0.05 % cream, Apply 1 application topically as needed (rash)., Disp: , Rfl:    hydrochlorothiazide (MICROZIDE) 12.5 MG capsule, Take 1 capsule (12.5 mg total) by mouth daily., Disp: 90 capsule, Rfl: 3   hydrOXYzine (ATARAX) 25 MG tablet,  Take 1 tablet (25 mg total) by mouth every 6 (six) hours., Disp: 90 tablet, Rfl: 0   levocetirizine (XYZAL) 5 MG tablet, Take 5 mg by mouth 2 (two) times daily., Disp: , Rfl:    losartan (COZAAR) 50 MG tablet, TAKE ONE TABLET BY MOUTH TWICE A DAY FOR FOR BLOOD PRESSURE, Disp: 180 tablet, Rfl: 1   magnesium 30 MG tablet, Take 90 mg by mouth daily., Disp: , Rfl:    metroNIDAZOLE (METROCREAM) 0.75 % cream, Apply 1 application topically 2 (two) times daily., Disp: , Rfl:     montelukast (SINGULAIR) 10 MG tablet, Take 1 tablet (10 mg total) by mouth at bedtime., Disp: 90 tablet, Rfl: 3   Multiple Vitamins-Minerals (PRESERVISION AREDS PO), Take 1 tablet by mouth daily., Disp: , Rfl:    nitroGLYCERIN (NITROSTAT) 0.4 MG SL tablet, Place 1 tablet (0.4 mg total) under the tongue every 5 (five) minutes as needed for chest pain., Disp: 15 tablet, Rfl: 1   omeprazole (PRILOSEC) 40 MG capsule, TAKE ONE CAPSULE BY MOUTH DAILY 30 MINUTES BEFORE BREAKFAST, Disp: 90 capsule, Rfl: 3   ondansetron (ZOFRAN-ODT) 4 MG disintegrating tablet, 4mg  ODT q4 hours prn nausea/vomit, Disp: 20 tablet, Rfl: 0   Potassium 99 MG TABS, Take 99 mg by mouth daily., Disp: , Rfl:    promethazine (PHENERGAN) 25 MG suppository, Place 1 suppository (25 mg total) rectally every 6 (six) hours as needed for nausea or vomiting., Disp: 12 each, Rfl: 0   QUERCETIN PO, Take 1 tablet by mouth daily., Disp: , Rfl:    Spacer/Aero-Holding Chambers (PRO COMFORT SPACER ADULT) MISC, 1 Device by Does not apply route as needed., Disp: 1 each, Rfl: 1   SUPER B COMPLEX/C PO, Take 1 tablet by mouth daily., Disp: , Rfl:    tacrolimus (PROTOPIC) 0.1 % ointment, Apply 1 application topically as needed (rash)., Disp: , Rfl:    traMADol-acetaminophen (ULTRACET) 37.5-325 MG tablet, Take 1 tablet by mouth every 8 (eight) hours as needed., Disp: 30 tablet, Rfl: 0   zinc gluconate 50 MG tablet, Take 50 mg by mouth daily., Disp: , Rfl:   Current Facility-Administered Medications:    0.9 %  sodium chloride infusion, 500 mL, Intravenous, Continuous, Danis, Kirke Corin, MD  EXAM:  VITALS per patient if applicable:  GENERAL: alert, oriented, appears well and in no acute distress  HEENT: atraumatic, conjunttiva clear, no obvious abnormalities on inspection of external nose and ears  NECK: normal movements of the head and neck  LUNGS: on inspection no signs of respiratory distress, breathing rate appears normal, no obvious gross SOB,  gasping or wheezing  CV: no obvious cyanosis  MS: moves all visible extremities without noticeable abnormality  PSYCH/NEURO: pleasant and cooperative, no obvious depression or anxiety, speech and thought processing grossly intact  ASSESSMENT AND PLAN:  Discussed the following assessment and plan:  No diagnosis found.   Discussed treatment options and risk of drug interactions, ideal treatment window, potential complications, isolation and precautions for COVID-19. Checked for/reviewed any labs done in the last 90 days with GFR listed in HPI if available. After lengthy discussion, the patient opted for treatment with Paxlovid due to being higher risk for complications of covid or severe disease and other factors. Discussed EUA status of this drug and the fact that there is preliminary limited knowledge of risks/interactions/side effects per EUA document vs possible benefits and precautions. This information was shared with patient during the visit and also was provided in patient instructions. Also,  advised that patient discuss risks/interactions and use with pharmacist/treatment team as well.  The patient did want a prescription for cough, Tessalon Rx sent.  Other symptomatic care measures summarized in patient instructions. Advised to seek prompt virtual visit or in person care if worsening, new symptoms arise, or if is not improving with treatment as expected per our conversation of expected course. Discussed options for follow up care. Did let this patient know that I do telemedicine on Tuesdays and Thursdays for [place] and those are the days I am logged into the system. Advised to schedule follow up visit with PCP, [place] virtual visits or [facility] if any further questions or concerns to avoid delays in care.   I discussed the assessment and treatment plan with the patient. Spent 23 minutes on this visit in exam, history, review of chart, orders and counseling. The patient was provided an  opportunity to ask questions and all were answered. The patient agreed with the plan and demonstrated an understanding of the instructions.     Lucretia Kern, DO

## 2021-09-17 NOTE — Telephone Encounter (Signed)
Patient called and I got her scheduled for a virtual visit with Dr. Maudie Mercury.

## 2021-09-17 NOTE — Patient Instructions (Addendum)
HOME CARE TIPS:  -COVID19 testing information: [email or web address]  Most pharmacies also offer testing and home test kits. If the Covid19 test is positive and you desire antiviral treatment, please contact a [facility] or schedule a follow up virtual visit through your primary care office or through the [facility].  Other test to treat options: [email or web address]  -I sent the medication(s) we discussed to your pharmacy: Meds ordered this encounter  Medications   nirmatrelvir/ritonavir EUA (PAXLOVID) 20 x 150 MG & 10 x 100MG TABS    Sig: Take 3 tablets by mouth 2 (two) times daily for 5 days. (Take nirmatrelvir 150 mg two tablets twice daily for 5 days and ritonavir 100 mg one tablet twice daily for 5 days) Patient GFR is > 60    Dispense:  30 tablet    Refill:  0   benzonatate (TESSALON PERLES) 100 MG capsule    Sig: 1-2 capsules up to twice daily as needed for cough    Dispense:  30 capsule    Refill:  0     -I sent in the [place] treatment or referral you requested per our discussion. Please see the information provided below and discuss further with the pharmacist/treatment team.  -If taking Paxlovid, please review all medications, supplement and over the counter drugs with your pharmacist and ask them to check for any interactions. Please make the following changes to your regular medications while taking Paxlovid: *the losartan, hydroxyzine and the ultracet can all potentially interact as we discussed - continue the losartan but monitor blood pressure. Hold the hydroxyzine and ultracet as we discussed since these are as needed.   -there is a chance of rebound illness after finishing your treatment. If you become sick again please isolate for an additional 5 days, plus 5 more days of masking.   -can use tylenol  if needed for fevers, aches and pains per instructions  -can use nasal saline a few times per  day if you have nasal congestion  -stay hydrated, drink plenty of fluids and eat small healthy meals - avoid dairy  -If the Covid test is positive, check out the [facility] website for more information on home care, transmission and treatment for COVID19  -follow up with your doctor in 2-3 days unless improving and feeling better  -stay home while sick, except to seek medical care. If you have COVID19, you will likely be contagious for 7-10 days. Flu or Influenza is likely contagious for about 7 days. Other respiratory viral infections remain contagious for 5-10+ days depending on the virus and many other factors. Wear a good mask that fits snugly (such as N95 or KN95) if around others to reduce the risk of transmission.  It was nice to meet you today, and I really hope you are feeling better soon. I help [facility] out with telemedicine visits on Tuesdays and Thursdays and am happy to help if you need a follow up virtual visit on those days. Otherwise, if you have any concerns or questions following this visit please schedule a follow up visit with your Primary Care doctor or seek care at a local urgent care clinic to avoid delays in care.    Seek in person care or schedule a follow up video visit promptly if your symptoms worsen, new concerns arise or you are not improving with treatment. Call 911 and/or seek emergency care if your symptoms are severe or life threatening.  FACT SHEET FOR PATIENTS, PARENTS, AND CAREGIVERS EMERGENCY USE AUTHORIZATION (EUA) OF  PAXLOVID FOR CORONAVIRUS DISEASE 2019 (COVID-19) You are being given this Fact Sheet because your healthcare provider believes it is necessary to provide you with PAXLOVID for the treatment of mild-to-moderate coronavirus disease (COVID-19) caused by the SARS-CoV-2 virus. This Fact Sheet contains information to help you understand the risks and benefits of taking the PAXLOVID you have received or may receive. The U.S. Food and Drug  Administration (FDA) has issued an Emergency Use Authorization (EUA) to make PAXLOVID available during the COVID-19 pandemic (for more details about an EUA please see What is an Emergency Use Authorization? at the end of this document). PAXLOVID is not an FDA-approved medicine in the [place]. Read this Fact Sheet for information about PAXLOVID. Talk to your healthcare provider about your options or if you have any questions. It is your choice to take PAXLOVID.  What is COVID-19? COVID-19 is caused by a virus called a coronavirus. You can get COVID-19 through close contact with another person who has the virus. COVID-19 illnesses have ranged from very mild-to-severe, including illness resulting in death. While information so far suggests that most COVID-19 illness is mild, serious illness can happen and may cause some of your other medical conditions to become worse. Older people and people of all ages with severe, long lasting (chronic) medical conditions like heart disease, lung disease, and diabetes, for example seem to be at higher risk of being hospitalized for COVID-19.  What is PAXLOVID? PAXLOVID is an investigational medicine used to treat mild-to-moderate COVID-19 in adults and children [31 years of age and older weighing at least 17 pounds (60 kg)] with positive results of direct SARS-CoV-2 viral testing, and who are at high risk for progression to severe COVID-19, including hospitalization or death. PAXLOVID is investigational because it is still being studied. There is limited information about the safety and effectiveness of using PAXLOVID to treat people with mild-to-moderate COVID-19.  The FDA has authorized the emergency use of PAXLOVID for the treatment of mild-tomoderate COVID-19 in adults and children [69 years of age and older weighing at least 36 pounds (23 kg)] with a positive test for the virus that causes COVID-19, and who are at high risk for progression  to severe COVID-19, including hospitalization or death, under an EUA. 1 Revised: 17 October 2020   What should I tell my healthcare provider before I take PAXLOVID? Tell your healthcare provider if you: ? Have any allergies ? Have liver or kidney disease ? Are pregnant or plan to become pregnant ? Are breastfeeding a child ? Have any serious illnesses  Tell your healthcare provider about all the medicines you take, including prescription and over-the-counter medicines, vitamins, and herbal supplements. Some medicines may interact with PAXLOVID and may cause serious side effects. Keep a list of your medicines to show your healthcare provider and pharmacist when you get a new medicine.  You can ask your healthcare provider or pharmacist for a list of medicines that interact with PAXLOVID. Do not start taking a new medicine without telling your healthcare provider. Your healthcare provider can tell you if it is safe to take PAXLOVID with other medicines.  Tell your healthcare provider if you are taking combined hormonal contraceptive. PAXLOVID may affect how your birth control pills work. Females who are able to become pregnant should use another effective alternative form of contraception or an additional barrier method of contraception. Talk to your healthcare provider if you have any questions about contraceptive methods that might be right for you.  How  do I take PAXLOVID? ? PAXLOVID consists of 2 medicines: nirmatrelvir and ritonavir. o Take 2 pink tablets of nirmatrelvir with 1 white tablet of ritonavir by mouth 2 times each day (in the morning and in the evening) for 5 days. For each dose, take all 3 tablets at the same time. o If you have kidney disease, talk to your healthcare provider. You may need a different dose. ? Swallow the tablets whole. Do not chew, break, or crush the tablets. ? Take PAXLOVID with or without food. ? Do not stop taking PAXLOVID without talking to  your healthcare provider, even if you feel better. ? If you miss a dose of PAXLOVID within 8 hours of the time it is usually taken, take it as soon as you remember. If you miss a dose by more than 8 hours, skip the missed dose and take the next dose at your regular time. Do not take 2 doses of PAXLOVID at the same time. ? If you take too much PAXLOVID, call your healthcare provider or go to the nearest hospital emergency room right away. ? If you are taking a ritonavir- or cobicistat-containing medicine to treat hepatitis C or Human Immunodeficiency Virus (HIV), you should continue to take your medicine as prescribed by your healthcare provider. 2 Revised: 17 October 2020    Talk to your healthcare provider if you do not feel better or if you feel worse after 5 days.  Who should generally not take PAXLOVID? Do not take PAXLOVID if: ? You are allergic to nirmatrelvir, ritonavir, or any of the ingredients in PAXLOVID. ? You are taking any of the following medicines: o Alfuzosin o Pethidine, propoxyphene o Ranolazine o Amiodarone, dronedarone, flecainide, propafenone, quinidine o Colchicine o Lurasidone, pimozide, clozapine o Dihydroergotamine, ergotamine, methylergonovine o Lovastatin, simvastatin o Sildenafil (Revatio) for pulmonary arterial hypertension (PAH) o Triazolam, oral midazolam o Apalutamide o Carbamazepine, phenobarbital, phenytoin o Rifampin o St. Johns Wort (hypericum perforatum) Taking PAXLOVID with these medicines may cause serious or life-threatening side effects or affect how PAXLOVID works.  These are not the only medicines that may cause serious side effects if taken with PAXLOVID. PAXLOVID may increase or decrease the levels of multiple other medicines. It is very important to tell your healthcare provider about all of the medicines you are taking because additional laboratory tests or changes in the dose of your other medicines may be necessary while you  are taking PAXLOVID. Your healthcare provider may also tell you about specific symptoms to watch out for that may indicate that you need to stop or decrease the dose of some of your other medicines.  What are the important possible side effects of PAXLOVID? Possible side effects of PAXLOVID are: ? Allergic Reactions. Allergic reactions can happen in people taking PAXLOVID, even after only 1 dose. Stop taking PAXLOVID and call your healthcare provider right away if you get any of the following symptoms of an allergic reaction: o hives o trouble swallowing or breathing o swelling of the mouth, lips, or face o throat tightness o hoarseness 3 Revised: 17 October 2020  o skin rash ? Liver Problems. Tell your healthcare provider right away if you have any of these signs and symptoms of liver problems: loss of appetite, yellowing of your skin and the whites of eyes (jaundice), dark-colored urine, pale colored stools and itchy skin, stomach area (abdominal) pain. ? Resistance to HIV Medicines. If you have untreated HIV infection, PAXLOVID may lead to some HIV medicines not working as  well in the future. ? Other possible side effects include: o altered sense of taste o diarrhea o high blood pressure o muscle aches These are not all the possible side effects of PAXLOVID. Not many people have taken PAXLOVID. Serious and unexpected side effects may happen. PAXLOVID is still being studied, so it is possible that all of the risks are not known at this time.  What other treatment choices are there? Veklury (remdesivir) is FDA-approved for the treatment of mild-to-moderate YOKHT-97 in certain adults and children. Talk with your doctor to see if Marijean Heath is appropriate for you. Like PAXLOVID, FDA may also allow for the emergency use of other medicines to treat people with COVID-19. Go to  https://price.info/ for information on the emergency use of other medicines that are authorized by FDA to treat people with COVID-19. Your healthcare provider may talk with you about clinical trials for which you may be eligible. It is your choice to be treated or not to be treated with PAXLOVID. Should you decide not to receive it or for your child not to receive it, it will not change your standard medical care.  What if I am pregnant or breastfeeding? There is no experience treating pregnant women or breastfeeding mothers with PAXLOVID. For a mother and unborn baby, the benefit of taking PAXLOVID may be greater than the risk from the treatment. If you are pregnant, discuss your options and specific situation with your healthcare provider. It is recommended that you use effective barrier contraception or do not have sexual activity while taking PAXLOVID. If you are breastfeeding, discuss your options and specific situation with your healthcare provider. 4 Revised: 17 October 2020   How do I report side effects with PAXLOVID? Contact your healthcare provider if you have any side effects that bother you or do not go away. Report side effects to FDA MedWatch at SmoothHits.hu or call 1-800-FDA1088 or you can report side effects to [employer]. at the contact information provided below. Website Fax number Telephone number www.pfizersafetyreporting.com [phone] How should I store [place]? Store PAXLOVID tablets at room temperature, between 68?F to 77?F (20?C to 25?C). How can I learn more about COVID-19? ? Ask your healthcare provider. ? Visit https://jacobson-johnson.com/. ? Contact your local or state public health department. What is an Emergency Use Authorization (EUA)? The [place] FDA has made PAXLOVID available under an emergency access mechanism  called an Emergency Use Authorization (EUA). The EUA is supported by a Education officer, museum and Human Service (HHS) declaration that circumstances exist to justify the emergency use of drugs and biological products during the COVID-19 pandemic. PAXLOVID for the treatment of mild-to-moderate COVID-19 in adults and children [17 years of age and older weighing at least 22 pounds (89 kg)] with positive results of direct SARS-CoV-2 viral testing, and who are at high risk for progression to severe COVID-19, including hospitalization or death, has not undergone the same type of review as an FDA-approved product. In issuing an EUA under the OHFGB-02 public health emergency, the FDA has determined, among other things, that based on the total amount of scientific evidence available including data from adequate and well-controlled clinical trials, if available, it is reasonable to believe that the product may be effective for diagnosing, treating, or preventing COVID-19, or a serious or life-threatening disease or condition caused by COVID-19; that the known and potential benefits of the product, when used to diagnose, treat, or prevent such disease or condition, outweigh the known and potential risks of such  product; and that there are no adequate, approved, and available alternatives. All of these criteria must be met to allow for the product to be used in the treatment of patients during the COVID-19 pandemic. The EUA for PAXLOVID is in effect for the duration of the COVID-19 declaration justifying emergency use of this product, unless terminated or revoked (after which the products may no longer be used under the EUA). 5 Revised: 17 October 2020     Additional Information For general questions, visit the website or call the telephone number provided below. Website Telephone number www.COVID19oralRx.com ([phone] (1-877-C19-PACK) You can also go to www.pfizermedinfo.com or call  [phone] for more information. [id number]-7215-8.7 Revised: 17 October 2020

## 2021-09-20 ENCOUNTER — Other Ambulatory Visit: Payer: Self-pay | Admitting: Physician Assistant

## 2021-09-30 ENCOUNTER — Encounter: Payer: Self-pay | Admitting: Physician Assistant

## 2021-09-30 ENCOUNTER — Ambulatory Visit (INDEPENDENT_AMBULATORY_CARE_PROVIDER_SITE_OTHER): Payer: Medicare HMO | Admitting: Physician Assistant

## 2021-09-30 ENCOUNTER — Other Ambulatory Visit: Payer: Self-pay

## 2021-09-30 VITALS — BP 160/84 | HR 76 | Temp 98.2°F | Wt 171.4 lb

## 2021-09-30 DIAGNOSIS — I10 Essential (primary) hypertension: Secondary | ICD-10-CM

## 2021-09-30 DIAGNOSIS — R052 Subacute cough: Secondary | ICD-10-CM | POA: Diagnosis not present

## 2021-09-30 DIAGNOSIS — R0982 Postnasal drip: Secondary | ICD-10-CM | POA: Diagnosis not present

## 2021-09-30 DIAGNOSIS — K219 Gastro-esophageal reflux disease without esophagitis: Secondary | ICD-10-CM

## 2021-09-30 DIAGNOSIS — M1711 Unilateral primary osteoarthritis, right knee: Secondary | ICD-10-CM | POA: Diagnosis not present

## 2021-09-30 MED ORDER — METOPROLOL SUCCINATE ER 25 MG PO TB24: 25.0000 mg | ORAL_TABLET | Freq: Every day | ORAL | 0 refills | Status: DC

## 2021-09-30 MED ORDER — BENZONATATE 100 MG PO CAPS: ORAL_CAPSULE | ORAL | 0 refills | Status: DC

## 2021-09-30 NOTE — Progress Notes (Signed)
? ?Subjective:  ? ? Patient ID: Tracy Mckenzie, female    DOB: July 24, 1956, 66 y.o.   MRN: [id number] ? ?Chief Complaint  ?Patient presents with  ? Hypertension  ?  173/90 with at home cuff ?160/84 with manuel cuff  ? Cough  ?  Has not resolved from COVID 2 weeks ago ?Requesting refill on tessalon pearls   ? ? ?HPI ?Patient is in today for f/up. ? ?Post-COVID-19 - tested positive 09/17/21. Still having cough and requesting tessalon perles. Using albuterol, which helps as well. Taking Xyzal twice daily and Singulair once daily. Still having post-nasal drip. She has not been using Astelin nasal spray. ? ?HTN: ?135/71 reading at home this morning. 150-160/70s on average at home.  ?Losartan 50 mg BID and HCTZ 12.5 mg daily. Tried to increase HCTZ to 25 mg, but had more urinary frequency at night and this was not good for her.  ? ?Past Medical History:  ?Diagnosis Date  ? Arthritis   ? Bleeding ulcer   ? Elevated cholesterol   ? Hiatal hernia   ? High blood pressure   ? Kidney cysts   ? Liver cyst   ? Nerve pain   ? L sciatic pain radiating to R  ? Peptic ulcer disease   ? ? ?Past Surgical History:  ?Procedure Laterality Date  ? APPENDECTOMY  1992  ? BREAST CYST ASPIRATION Left 2003  ? KNEE ARTHROSCOPY Right 04/12/2001  ? torn cartilage  ? LAPAROSCOPY  1981  ? for endometriosis  ? LEG SURGERY Right 11/23/2000  ? to remove screws  ? LEG SURGERY Right 02/08/2002  ? to remove rod  ? MENISCUS REPAIR  01/29/2003  ? TIBIA FRACTURE SURGERY Right 06/12/2000  ? Multiple surgeries  ? TONSILLECTOMY  1967  ? TOTAL ABDOMINAL HYSTERECTOMY  1992  ? ? ?Family History  ?Problem Relation Age of Onset  ? Kidney disease Mother   ? COPD Mother   ? Breast cancer Maternal Aunt   ? Breast cancer Paternal Aunt   ? COPD Father   ? Diabetes Maternal Grandfather   ? Stroke Paternal Grandfather   ? Colon cancer Neg Hx   ? ? ?Social History  ? ?Tobacco Use  ? Smoking status: Never  ? Smokeless tobacco: Never  ?Vaping Use  ? Vaping Use: Never used   ?Substance Use Topics  ? Alcohol use: No  ? Drug use: No  ?  ? ?Allergies  ?Allergen Reactions  ? Meperidine Palpitations and Anaphylaxis  ?  flushed  ? Ibuprofen Other (See Comments)  ?  Stomach ulcers.  ? Simvastatin   ?  Other reaction(s): Muscle Pain  ? Oxycodone-Acetaminophen Itching and Rash  ? Prolia [Denosumab] Rash  ? Vicodin [Hydrocodone-Acetaminophen] Itching  ? ? ?Review of Systems ?NEGATIVE UNLESS OTHERWISE INDICATED IN HPI ? ? ?   ?Objective:  ?  ? ?BP (!) 160/84   Pulse 76   Temp 98.2 ?F (36.8 ?C) (Temporal)   Wt 171 lb 6.4 oz (77.7 kg)   SpO2 98%   BMI 27.66 kg/m?  ? ?Wt Readings from Last 3 Encounters:  ?09/30/21 171 lb 6.4 oz (77.7 kg)  ?08/01/21 170 lb (77.1 kg)  ?07/02/21 174 lb 3.2 oz (79 kg)  ? ? ?BP Readings from Last 3 Encounters:  ?09/30/21 (!) 160/84  ?08/01/21 128/71  ?07/02/21 (!) 173/92  ?  ? ?Physical Exam ?Vitals and nursing note reviewed.  ?Constitutional:   ?   Appearance: Normal appearance. She is normal weight. She  is not toxic-appearing.  ?HENT:  ?   Head: Normocephalic and atraumatic.  ?   Right Ear: External ear normal.  ?   Left Ear: External ear normal.  ?   Nose: Nose normal.  ?   Mouth/Throat:  ?   Mouth: Mucous membranes are moist.  ?Eyes:  ?   Extraocular Movements: Extraocular movements intact.  ?   Conjunctiva/sclera: Conjunctivae normal.  ?   Pupils: Pupils are equal, round, and reactive to light.  ?Cardiovascular:  ?   Rate and Rhythm: Normal rate and regular rhythm.  ?   Pulses: Normal pulses.  ?   Heart sounds: Normal heart sounds.  ?Pulmonary:  ?   Effort: Pulmonary effort is normal.  ?   Breath sounds: Normal breath sounds.  ?Musculoskeletal:  ?   Cervical back: Normal range of motion and neck supple.  ?   Right knee: Swelling present. Decreased range of motion. Tenderness present.  ?Skin: ?   General: Skin is warm and dry.  ?Neurological:  ?   General: No focal deficit present.  ?   Mental Status: She is alert and oriented to person, place, and time.   ?Psychiatric:     ?   Mood and Affect: Mood normal.     ?   Behavior: Behavior normal.     ?   Thought Content: Thought content normal.     ?   Judgment: Judgment normal.  ? ? ?   ?Assessment & Plan:  ? ?Problem List Items Addressed This Visit   ? ?  ? Cardiovascular and Mediastinum  ? Essential hypertension  ? Relevant Medications  ? metoprolol succinate (TOPROL-XL) 25 MG 24 hr tablet  ?  ? Digestive  ? GERD (gastroesophageal reflux disease)  ? ?Other Visit Diagnoses   ? ? Subacute cough    -  Primary  ? Post-nasal drip      ? Arthritis of right knee      ? ?  ? ? ? ?Meds ordered this encounter  ?Medications  ? benzonatate (TESSALON PERLES) 100 MG capsule  ?  Sig: 1-2 capsules up to twice daily as needed for cough  ?  Dispense:  30 capsule  ?  Refill:  0  ? metoprolol succinate (TOPROL-XL) 25 MG 24 hr tablet  ?  Sig: Take 1 tablet (25 mg total) by mouth daily.  ?  Dispense:  30 tablet  ?  Refill:  0  ? ? ?1. Subacute cough ?2. Post-nasal drip ?2/2 COVID-19  ?Tessalon perles ?Nasal saline, Astelin spray ? ?3. Arthritis of right knee ?Chronic hx, worsening, pt says she's been told she needs surgery. ?Recommended she talk with Dr. Wynelle Link ? ?4. Essential hypertension ?Not to goal, still elevated ?Losartan 50 mg BID ?HCTZ 12.5 mg qd ?Add Toprol XL 25 mg qd ?Monitor and bring readings to next appt ? ?5. Gastroesophageal reflux disease without esophagitis ?Stable on Prilosec 40 mg qd ? ? ?Abhiram Criado M [place]ple, PA-C ?

## 2021-09-30 NOTE — Patient Instructions (Addendum)
Add Toprol XL 25 mg daily to your BP regimen - monitor at home and bring reading to next appt ? ?Tessalon perles refilled for cough  ?Astelin spray x 2 weeks  ? ?Try to discuss your knee with Dr. Wynelle Link  ? ?Call [facility] to schedule your mammogram. ?

## 2021-10-08 ENCOUNTER — Ambulatory Visit (INDEPENDENT_AMBULATORY_CARE_PROVIDER_SITE_OTHER): Payer: Medicare HMO | Admitting: *Deleted

## 2021-10-08 DIAGNOSIS — R053 Chronic cough: Secondary | ICD-10-CM

## 2021-10-08 DIAGNOSIS — E782 Mixed hyperlipidemia: Secondary | ICD-10-CM

## 2021-10-08 DIAGNOSIS — I10 Essential (primary) hypertension: Secondary | ICD-10-CM

## 2021-10-08 NOTE — Chronic Care Management (AMB) (Signed)
Care Management    RN Visit Note  10/08/2021 Name: Tracy Mckenzie MRN: [id number] DOB: 07-02-1956  Subjective: Tracy Mckenzie is a 66 y.o. year old female who is a primary care patient of Allwardt, Alyssa M, PA-C. The care management team was consulted for assistance with disease management and care coordination needs.    Engaged with patient by telephone for follow up visit in response to provider referral for case management and/or care coordination services.   Consent to Services:   Tracy Mckenzie was given information about Care Management services today including:  Care Management services includes personalized support from designated clinical staff supervised by her physician, including individualized plan of care and coordination with other care providers 24/7 contact phone numbers for assistance for urgent and routine care needs. The patient may stop case management services at any time by phone call to the office staff.  Patient agreed to services and consent obtained.   Assessment: Review of patient past medical history, allergies, medications, health status, including review of consultants reports, laboratory and other test data, was performed as part of comprehensive evaluation and provision of chronic care management services.   SDOH (Social Determinants of Health) assessments and interventions performed:    Care Plan  Allergies  Allergen Reactions   Meperidine Palpitations and Anaphylaxis    flushed   Ibuprofen Other (See Comments)    Stomach ulcers.   Simvastatin     Other reaction(s): Muscle Pain   Oxycodone-Acetaminophen Itching and Rash   Prolia [Denosumab] Rash   Vicodin [Hydrocodone-Acetaminophen] Itching    Outpatient Encounter Medications as of 10/08/2021  Medication Sig   metoprolol succinate (TOPROL-XL) 25 MG 24 hr tablet Take 1 tablet (25 mg total) by mouth daily.   montelukast (SINGULAIR) 10 MG tablet Take 1 tablet (10 mg total) by mouth at bedtime.    albuterol (VENTOLIN HFA) 108 (90 Base) MCG/ACT inhaler Inhale 2 puffs into the lungs every 6 (six) hours as needed for wheezing or shortness of breath.   azelastine (ASTELIN) 0.1 % nasal spray Place 1 spray into both nostrils 2 (two) times daily as needed for rhinitis or allergies.   baclofen (LIORESAL) 10 MG tablet TAKE ONE TABLET BY MOUTH THREE TIMES A DAY   benzonatate (TESSALON PERLES) 100 MG capsule 1-2 capsules up to twice daily as needed for cough   chlorpheniramine-HYDROcodone (TUSSIONEX PENNKINETIC ER) 10-8 MG/5ML SUER Take 5 mLs by mouth every 12 (twelve) hours as needed for cough.   Coenzyme Q10 (CO Q-10) 100 MG CAPS Take 100 mg by mouth daily.   fluticasone (CUTIVATE) 0.05 % cream Apply 1 application topically as needed (rash).   gabapentin (NEURONTIN) 300 MG capsule TAKE ONE CAPSULE BY MOUTH TWICE A DAY   hydrochlorothiazide (MICROZIDE) 12.5 MG capsule Take 1 capsule (12.5 mg total) by mouth daily.   hydrOXYzine (ATARAX) 25 MG tablet Take 1 tablet (25 mg total) by mouth every 6 (six) hours.   levocetirizine (XYZAL) 5 MG tablet Take 5 mg by mouth 2 (two) times daily.   losartan (COZAAR) 50 MG tablet TAKE ONE TABLET BY MOUTH TWICE A DAY FOR FOR BLOOD PRESSURE   magnesium 30 MG tablet Take 90 mg by mouth daily.   metroNIDAZOLE (METROCREAM) 0.75 % cream Apply 1 application topically 2 (two) times daily.   Multiple Vitamins-Minerals (PRESERVISION AREDS PO) Take 1 tablet by mouth daily.   nitroGLYCERIN (NITROSTAT) 0.4 MG SL tablet Place 1 tablet (0.4 mg total) under the tongue every 5 (five) minutes as needed  for chest pain.   omeprazole (PRILOSEC) 40 MG capsule TAKE ONE CAPSULE BY MOUTH DAILY 30 MINUTES BEFORE BREAKFAST   ondansetron (ZOFRAN-ODT) 4 MG disintegrating tablet 74m ODT q4 hours prn nausea/vomit   Potassium 99 MG TABS Take 99 mg by mouth daily.   promethazine (PHENERGAN) 25 MG suppository Place 1 suppository (25 mg total) rectally every 6 (six) hours as needed for nausea or  vomiting.   QUERCETIN PO Take 1 tablet by mouth daily.   Spacer/Aero-Holding Chambers (PRO COMFORT SPACER ADULT) MISC 1 Device by Does not apply route as needed.   SUPER B COMPLEX/C PO Take 1 tablet by mouth daily.   tacrolimus (PROTOPIC) 0.1 % ointment Apply 1 application topically as needed (rash).   traMADol-acetaminophen (ULTRACET) 37.5-325 MG tablet Take 1 tablet by mouth every 8 (eight) hours as needed.   zinc gluconate 50 MG tablet Take 50 mg by mouth daily.   Facility-Administered Encounter Medications as of 10/08/2021  Medication   0.9 %  sodium chloride infusion    Patient Active Problem List   Diagnosis Date Noted   Back pain 11/27/2020   Osteopenia 11/27/2020   Sciatica 11/27/2020   Surgical menopause 11/27/2020   Tibia/fibula fracture 11/27/2020   Unilateral primary osteoarthritis, right knee 09/05/2019   Hyperlipidemia 08/15/2019   Essential hypertension 08/13/2019   GERD (gastroesophageal reflux disease) 08/13/2019   Osteoporosis 08/13/2019   Post-traumatic osteoarthritis of right knee 08/13/2019   Hiatal hernia 08/13/2019   Chronic left-sided low back pain without sciatica 08/13/2019    Conditions to be addressed/monitored: HTN and HLD  [place] Updates made by TLeona Singleton RN since 10/08/2021 12:00 AM     Problem: Knowledge deficit related to self care management of hypertension and hyperlipidemia   Priority: Medium     Long-Range Goal: Patient will work with the CCM team to better manager hypertension and hyperlipidemia   Start Date: 04/02/2021  Expected End Date: 10/30/2021  Recent Progress: Not on track  Priority: Medium  Note:   Current Barriers:  Knowledge Deficits related to plan of care for management of HTN and HLD  patient reporting normally checking blood pressures a few times a week with normal ranges of 140/70's.  Continues to check pressures a few times a week.  States it has been running normal, unsure of exact  numbers and machine is on charger right now.  Does endorse ED visit on 12/31 related to black emesis with history of ulcers.  States there has been stomach bug infecting the entire home.  Reports she is able to stay hydrated, has no appetite, tolerating and keeping down soup.  Is taking Zofran  1/26--Patient stating she feels much better. N/V/D is resolved and patient able to keep food, liquids and meds down.  Feels  back to normal self.  Does report blood pressure was elevated, especially when not ado keep medications down; states it is better but does not remember exact number.  3/9--states she is doing fair; [place] sinuses/allergies are acting up.  Taking here allergy medicine and trying to gain her stregnth back. Concerned about elevated bps recently.  bp this morning was 165/82 with recent ranges of 150-160/60-70's.    RNCM Clinical Goal(s):  Patient will verbalize understanding of plan for management of HTN and HLD continue to work with RN Care Manager to address care management and care coordination needs related to HTN and HLD  through collaboration with RN Care manager,  provider, and care team.   Interventions: 1:1 collaboration with primary care provider regarding development and update of comprehensive plan of care as evidenced by provider attestation and co-signature Inter-disciplinary care team collaboration (see longitudinal plan of care) Evaluation of current treatment plan related to  self management and patient's adherence to plan as established by provider   Hyperlipidemia:  (Status: Goal on Track (progressing): YES. Condition stable. Not addressed this visit.) Lab Results  Component Value Date   CHOL 187 07/02/2021   HDL 57.10 07/02/2021   LDLCALC 109 (H) 07/02/2021   TRIG 105.0 07/02/2021   CHOLHDL 3 07/02/2021     Medication review performed; medication list updated in electronic medical record.  Provider established cholesterol goals  reviewed; Counseled on importance of regular laboratory monitoring as prescribed; Provided HLD educational materials;  Hypertension: (Status: Goal on track: NO.) Last practice recorded BP readings:  BP Readings from Last 3 Encounters:  09/30/21 (!) 160/84  08/01/21 128/71  07/02/21 (!) 173/92  Most recent eGFR/CrCl: No results found for: EGFR  No components found for: CRCL  Evaluation of current treatment plan related to hypertension self management and patient's adherence to plan as established by provider;   Reviewed prescribed diet low salt low fat low cholesterol Reviewed medications with patient and discussed importance of compliance;  Discussed plans with patient for ongoing care management follow up and provided patient with direct contact information for care management team; Advised patient, providing education and rationale, to monitor blood pressure daily and record, calling PCP for findings outside established parameters;  Discussed complications of poorly controlled blood pressure such as heart disease, stroke, circulatory complications, vision complications, kidney impairment, sexual dysfunction;  Make sure to stay hydrated; continue to take Zofran for nausea and vomiting Contact PCP for elevated blood pressures  Patient Goals/Self-Care Activities: Patient will self administer medications as prescribed Patient will attend all scheduled provider appointments Patient will call pharmacy for medication refills Patient will call provider office for new concerns or questions Check blood pressure 3 times per week Write blood pressure results in a log and take to provider for review Low salt low fat low cholesterol diet Increase activity as tolerated Check blood pressure 3 times per week Write blood pressure results in a log and take to provider for review Low salt low fat low cholesterol diet Increase activity as tolerated Contact PCP for continued elevated blood  pressures Stay hydrated     Plan: The care management team will reach out to the patient again over the next 45 days.   Hubert Azure RN, MSN RN Care Management Coordinator  [facility] [phone] [email or web address]           Chronic Care Management   CCM RN Visit Note  10/08/2021 Name: Tracy Mckenzie MRN: [id number] DOB: Feb 05, 1956  Subjective: Tracy Mckenzie is a 66 y.o. year old female who is a primary care patient of Allwardt, Randa Evens, PA-C. The care management team was consulted for assistance with disease management and care coordination needs.    Engaged with patient by telephone for follow up visit in response to provider referral for case management and/or care coordination services.   Consent to Services:  The patient was given information about Chronic Care Management services, agreed to services, and gave verbal consent prior to initiation of services.  Please see initial visit note for detailed documentation.   Patient agreed to services and verbal consent obtained.   Assessment: Review of patient past medical history, allergies, medications, health  status, including review of consultants reports, laboratory and other test data, was performed as part of comprehensive evaluation and provision of chronic care management services.   SDOH (Social Determinants of Health) assessments and interventions performed:    CCM Care Plan  Allergies  Allergen Reactions   Meperidine Palpitations and Anaphylaxis    flushed   Ibuprofen Other (See Comments)    Stomach ulcers.   Simvastatin     Other reaction(s): Muscle Pain   Oxycodone-Acetaminophen Itching and Rash   Prolia [Denosumab] Rash   Vicodin [Hydrocodone-Acetaminophen] Itching    Outpatient Encounter Medications as of 10/08/2021  Medication Sig   metoprolol succinate (TOPROL-XL) 25 MG 24 hr tablet Take 1 tablet (25 mg total) by mouth daily.   montelukast (SINGULAIR) 10  MG tablet Take 1 tablet (10 mg total) by mouth at bedtime.   albuterol (VENTOLIN HFA) 108 (90 Base) MCG/ACT inhaler Inhale 2 puffs into the lungs every 6 (six) hours as needed for wheezing or shortness of breath.   azelastine (ASTELIN) 0.1 % nasal spray Place 1 spray into both nostrils 2 (two) times daily as needed for rhinitis or allergies.   baclofen (LIORESAL) 10 MG tablet TAKE ONE TABLET BY MOUTH THREE TIMES A DAY   benzonatate (TESSALON PERLES) 100 MG capsule 1-2 capsules up to twice daily as needed for cough   chlorpheniramine-HYDROcodone (TUSSIONEX PENNKINETIC ER) 10-8 MG/5ML SUER Take 5 mLs by mouth every 12 (twelve) hours as needed for cough.   Coenzyme Q10 (CO Q-10) 100 MG CAPS Take 100 mg by mouth daily.   fluticasone (CUTIVATE) 0.05 % cream Apply 1 application topically as needed (rash).   gabapentin (NEURONTIN) 300 MG capsule TAKE ONE CAPSULE BY MOUTH TWICE A DAY   hydrochlorothiazide (MICROZIDE) 12.5 MG capsule Take 1 capsule (12.5 mg total) by mouth daily.   hydrOXYzine (ATARAX) 25 MG tablet Take 1 tablet (25 mg total) by mouth every 6 (six) hours.   levocetirizine (XYZAL) 5 MG tablet Take 5 mg by mouth 2 (two) times daily.   losartan (COZAAR) 50 MG tablet TAKE ONE TABLET BY MOUTH TWICE A DAY FOR FOR BLOOD PRESSURE   magnesium 30 MG tablet Take 90 mg by mouth daily.   metroNIDAZOLE (METROCREAM) 0.75 % cream Apply 1 application topically 2 (two) times daily.   Multiple Vitamins-Minerals (PRESERVISION AREDS PO) Take 1 tablet by mouth daily.   nitroGLYCERIN (NITROSTAT) 0.4 MG SL tablet Place 1 tablet (0.4 mg total) under the tongue every 5 (five) minutes as needed for chest pain.   omeprazole (PRILOSEC) 40 MG capsule TAKE ONE CAPSULE BY MOUTH DAILY 30 MINUTES BEFORE BREAKFAST   ondansetron (ZOFRAN-ODT) 4 MG disintegrating tablet 83m ODT q4 hours prn nausea/vomit   Potassium 99 MG TABS Take 99 mg by mouth daily.   promethazine (PHENERGAN) 25 MG suppository Place 1 suppository (25 mg  total) rectally every 6 (six) hours as needed for nausea or vomiting.   QUERCETIN PO Take 1 tablet by mouth daily.   Spacer/Aero-Holding Chambers (PRO COMFORT SPACER ADULT) MISC 1 Device by Does not apply route as needed.   SUPER B COMPLEX/C PO Take 1 tablet by mouth daily.   tacrolimus (PROTOPIC) 0.1 % ointment Apply 1 application topically as needed (rash).   traMADol-acetaminophen (ULTRACET) 37.5-325 MG tablet Take 1 tablet by mouth every 8 (eight) hours as needed.   zinc gluconate 50 MG tablet Take 50 mg by mouth daily.   Facility-Administered Encounter Medications as of 10/08/2021  Medication   0.9 %  sodium chloride infusion    Patient Active Problem List   Diagnosis Date Noted   Back pain 11/27/2020   Osteopenia 11/27/2020   Sciatica 11/27/2020   Surgical menopause 11/27/2020   Tibia/fibula fracture 11/27/2020   Unilateral primary osteoarthritis, right knee 09/05/2019   Hyperlipidemia 08/15/2019   Essential hypertension 08/13/2019   GERD (gastroesophageal reflux disease) 08/13/2019   Osteoporosis 08/13/2019   Post-traumatic osteoarthritis of right knee 08/13/2019   Hiatal hernia 08/13/2019   Chronic left-sided low back pain without sciatica 08/13/2019    Conditions to be addressed/monitored:HTN and COPD  Care Plan : [place]  Updates made by Leona Singleton, RN since 10/08/2021 12:00 AM     Problem: Knowledge deficit related to self care management of hypertension and hyperlipidemia   Priority: Medium     Long-Range Goal: Patient will work with the CCM team to better manager hypertension and hyperlipidemia   Start Date: 04/02/2021  Expected End Date: 10/30/2021  Recent Progress: Not on track  Priority: Medium  Note:   Current Barriers:  Knowledge Deficits related to plan of care for management of HTN and HLD  patient reporting normally checking blood pressures a few times a week with normal ranges of 140/70's.  Continues to check pressures a few times a  week.  States it has been running normal, unsure of exact numbers and machine is on charger right now.  Does endorse ED visit on 12/31 related to black emesis with history of ulcers.  States there has been stomach bug infecting the entire home.  Reports she is able to stay hydrated, has no appetite, tolerating and keeping down soup.  Is taking Zofran  1/26--Patient stating she feels much better. N/V/D is resolved and patient able to keep food, liquids and meds down.  Feels  back to normal self.  Does report blood pressure was elevated, especially when not ado keep medications down; states it is better but does not remember exact number.  3/9--states she is doing fair; recovered from [place] but now sinuses/allergies are acting up.  Taking here allergy medicine and trying to gain her stregnth back. Concerned about elevated bps recently.  bp this morning was 165/82 with recent ranges of 150-160/60-70's.    RNCM Clinical Goal(s):  Patient will verbalize understanding of plan for management of HTN and HLD continue to work with RN Care Manager to address care management and care coordination needs related to HTN and HLD  through collaboration with RN Care manager, provider, and care team.   Interventions: 1:1 collaboration with primary care provider regarding development and update of comprehensive plan of care as evidenced by provider attestation and co-signature Inter-disciplinary care team collaboration (see longitudinal plan of care) Evaluation of current treatment plan related to  self management and patient's adherence to plan as established by provider   Hyperlipidemia:  (Status: Goal on Track (progressing): YES. Condition stable. Not addressed this visit.) Lab Results  Component Value Date   CHOL 187 07/02/2021   HDL 57.10 07/02/2021   LDLCALC 109 (H) 07/02/2021   TRIG 105.0 07/02/2021   CHOLHDL 3 07/02/2021     Medication review performed; medication list updated in electronic medical  record.  Provider established cholesterol goals reviewed; Counseled on importance of regular laboratory monitoring as prescribed; Provided HLD educational materials;  Hypertension: (Status: Goal on track: NO.) Last practice recorded BP readings:  BP Readings from Last 3 Encounters:  09/30/21 (!) 160/84  08/01/21 128/71  07/02/21 (!) 173/92  Most recent  eGFR/CrCl: No results found for: EGFR  No components found for: CRCL  Evaluation of current treatment plan related to hypertension self management and patient's adherence to plan as established by provider;   Reviewed prescribed diet low salt low fat low cholesterol Reviewed medications with patient and discussed importance of compliance;  Discussed plans with patient for ongoing care management follow up and provided patient with direct contact information for care management team; Advised patient, providing education and rationale, to monitor blood pressure daily and record, calling PCP for findings outside established parameters;  Discussed complications of poorly controlled blood pressure such as heart disease, stroke, circulatory complications, vision complications, kidney impairment, sexual dysfunction;  Make sure to stay hydrated; continue to take Zofran for nausea and vomiting Contact PCP for elevated blood pressures  Patient Goals/Self-Care Activities: Patient will self administer medications as prescribed Patient will attend all scheduled provider appointments Patient will call pharmacy for medication refills Patient will call provider office for new concerns or questions Check blood pressure 3 times per week Write blood pressure results in a log and take to provider for review Low salt low fat low cholesterol diet Increase activity as tolerated Check blood pressure 3 times per week Write blood pressure results in a log and take to provider for review Low salt low fat low cholesterol diet Increase activity as  tolerated Contact PCP for continued elevated blood pressures Stay hydrated     Plan:The care management team will reach out to the patient again over the next 45 days.  Hubert Azure RN, MSN RN Care Management Coordinator  [place] [phone] [email or web address]

## 2021-10-08 NOTE — Patient Instructions (Addendum)
Visit Information ? ?Thank you for taking time to visit with me today. Please don't hesitate to contact me if I can be of assistance to you before our next scheduled telephone appointment. ? ?Following are the goals we discussed today:  ?Patient will self administer medications as prescribed ?Patient will attend all scheduled provider appointments ?Patient will call pharmacy for medication refills ?Patient will call provider office for new concerns or questions ?Check blood pressure 3 times per week ?Write blood pressure results in a log and take to provider for review ?Low salt low fat low cholesterol diet ?Increase activity as tolerated ?Check blood pressure 3 times per week ?Write blood pressure results in a log and take to provider for review ?Low salt low fat low cholesterol diet ?Increase activity as tolerated ?Contact PCP for continued elevated blood pressures ?Stay hydrated ? ?Our next appointment is by telephone on 4/18 at 1000 ? ?Please call the care guide team at [phone] if you need to cancel or reschedule your appointment.  ? ?If you are experiencing a Mental Health or [place] or need someone to talk to, please call the Suicide and Crisis Lifeline: 988 ?call the [place] National Suicide Prevention Lifeline: 4134060840 or TTY: 646-871-7915 TTY (309) 490-8803) to talk to a trained counselor ?call 1-800-273-TALK (toll free, 24 hour hotline) ?go to [facility] [address], [place] [phone]) ?call 911  ? ?Patient verbalizes understanding of instructions and care plan provided today and agrees to view in [place]. Active MyChart status confirmed with patient.   ? ?Hubert Azure RN, MSN ?RN Care Management Coordinator  ?[place] ?[phone] ?[email or web address] ? ?

## 2021-10-17 IMAGING — CR DG CHEST 2V
2 series · 2 of 2 positions shown · non-contrast
Comparison: 01/31/2020

CLINICAL DATA: Chest pain

EXAM:
CHEST - 2 VIEW

[chest pa]
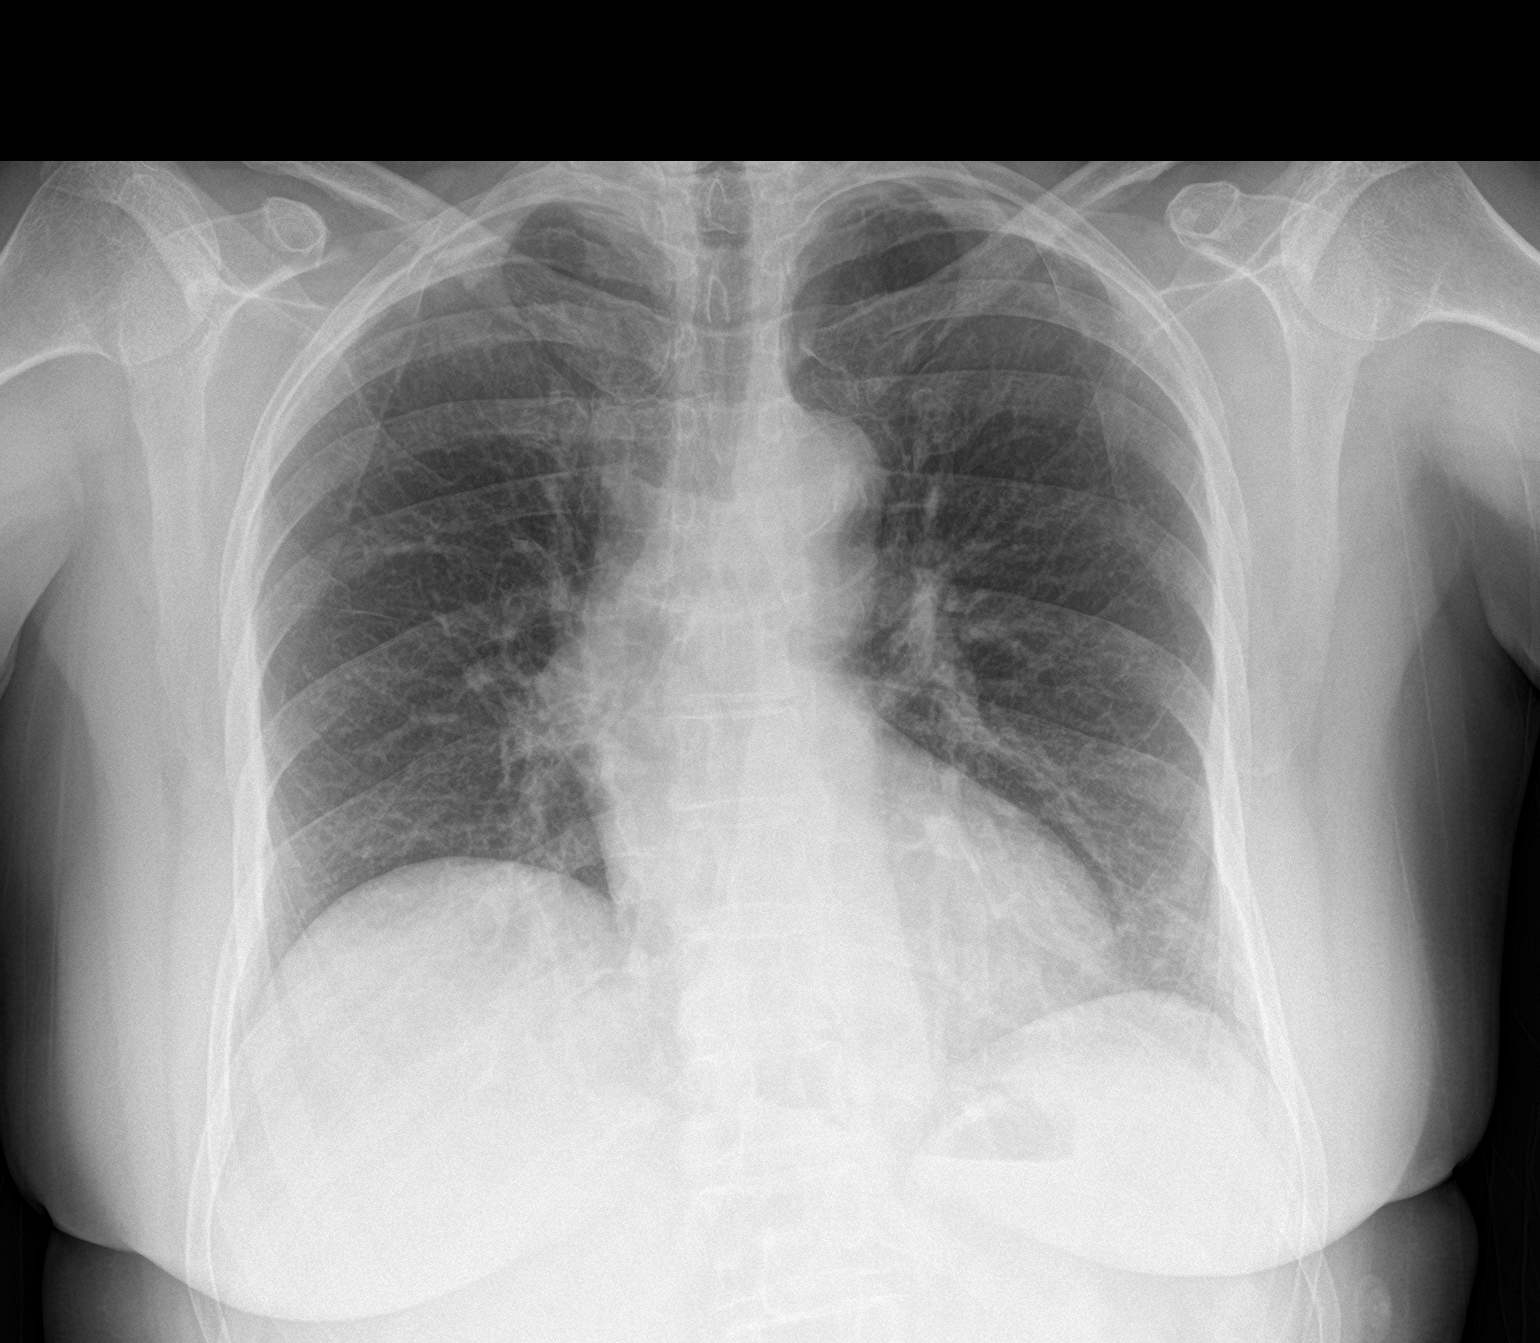

[chest lat]
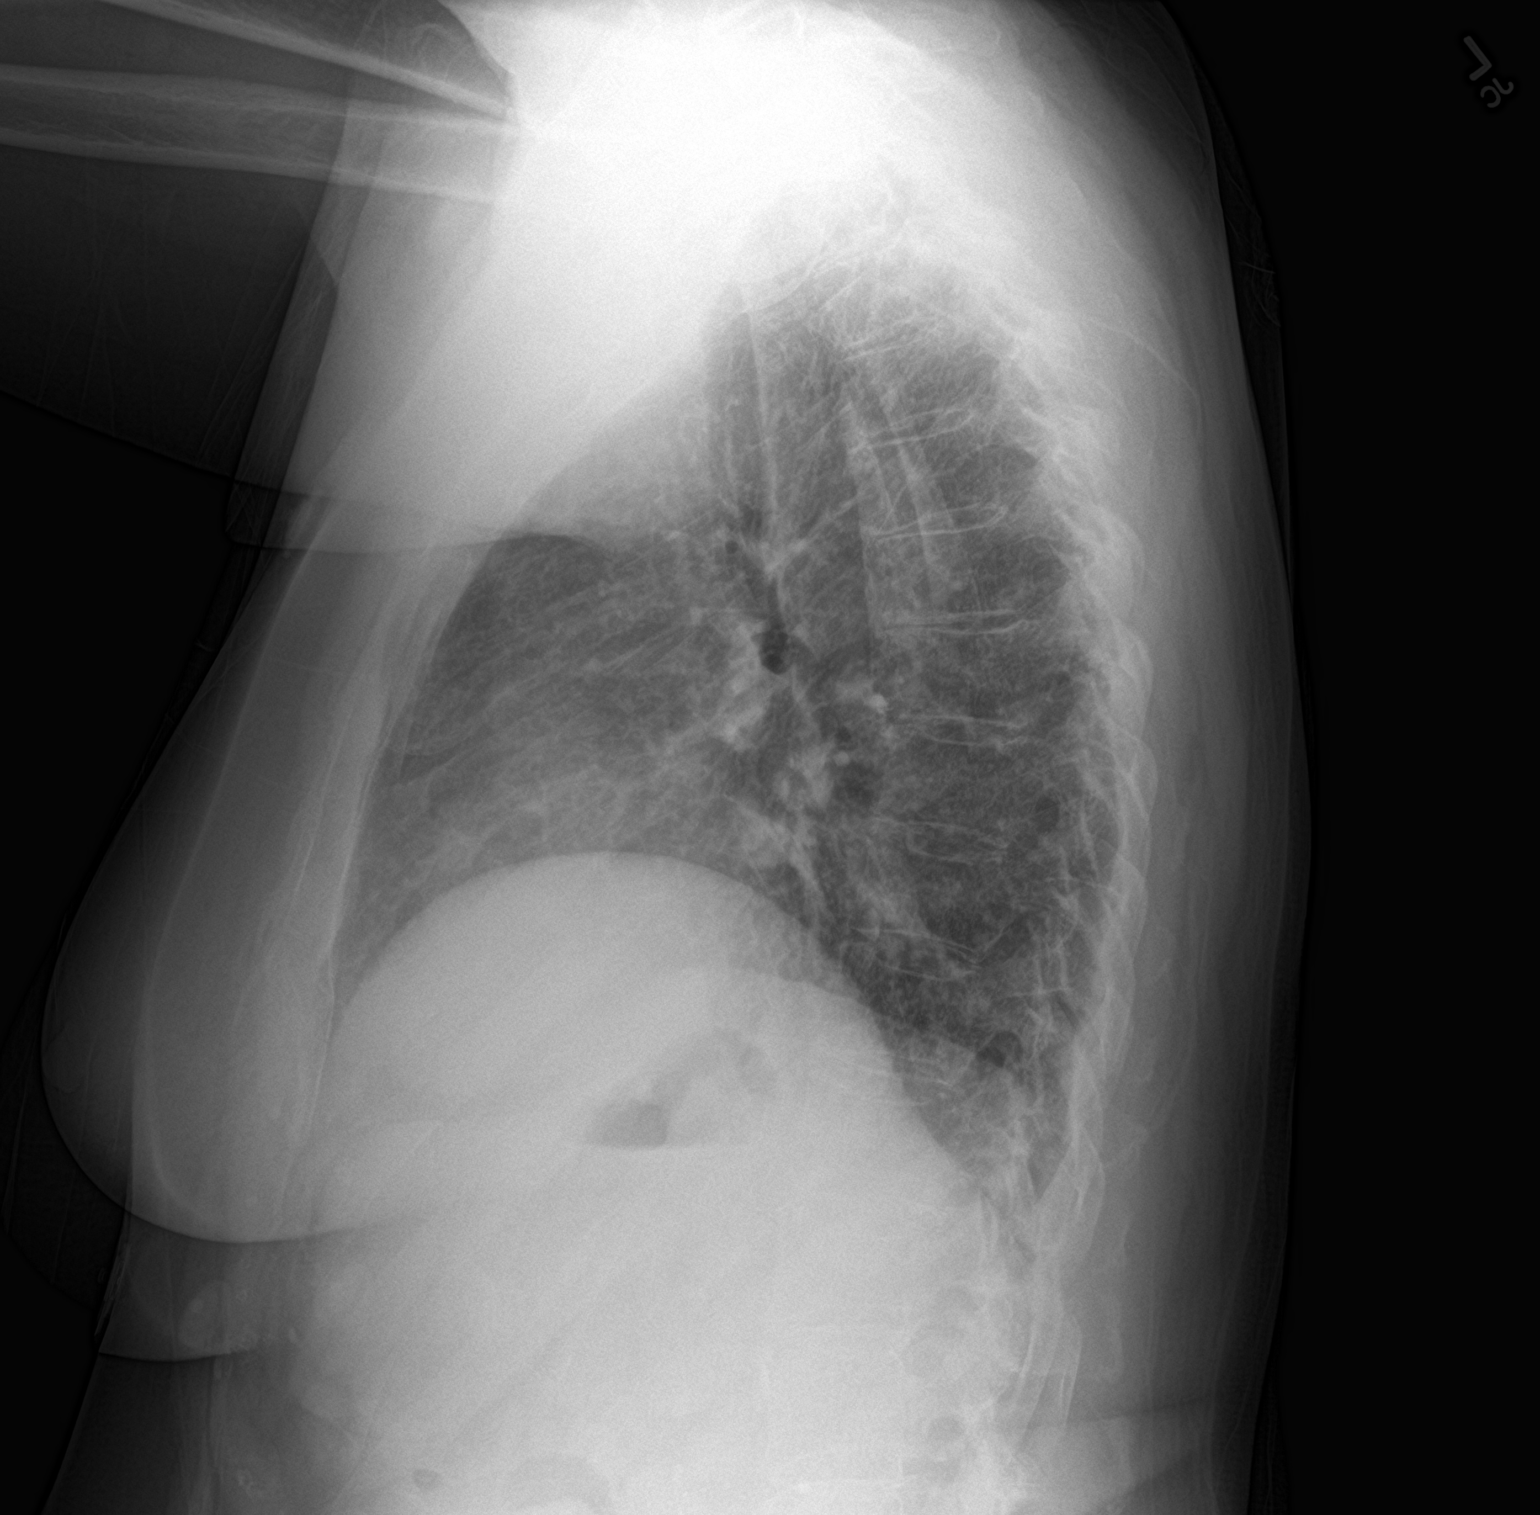

[2 of 2 positions shown; findings below may reference images not displayed]

FINDINGS: Frontal and lateral views of the chest demonstrate an unremarkable
cardiac silhouette. No airspace disease, effusion, or pneumothorax.
No acute bony abnormalities.
IMPRESSION: 1. Stable chest, no acute process.

## 2021-10-27 ENCOUNTER — Other Ambulatory Visit: Payer: Self-pay | Admitting: Physician Assistant

## 2021-10-30 ENCOUNTER — Ambulatory Visit (INDEPENDENT_AMBULATORY_CARE_PROVIDER_SITE_OTHER): Payer: Medicare HMO | Admitting: Physician Assistant

## 2021-10-30 ENCOUNTER — Ambulatory Visit (INDEPENDENT_AMBULATORY_CARE_PROVIDER_SITE_OTHER): Payer: Medicare HMO

## 2021-10-30 VITALS — BP 124/66 | HR 69 | Wt 174.6 lb

## 2021-10-30 VITALS — BP 124/66 | HR 74 | Temp 98.2°F | Resp 16 | Ht 66.0 in | Wt 174.4 lb

## 2021-10-30 DIAGNOSIS — I10 Essential (primary) hypertension: Secondary | ICD-10-CM

## 2021-10-30 DIAGNOSIS — Z0000 Encounter for general adult medical examination without abnormal findings: Secondary | ICD-10-CM | POA: Diagnosis not present

## 2021-10-30 DIAGNOSIS — E785 Hyperlipidemia, unspecified: Secondary | ICD-10-CM | POA: Diagnosis not present

## 2021-10-30 MED ORDER — LOSARTAN POTASSIUM 50 MG PO TABS: ORAL_TABLET | ORAL | 1 refills | Status: DC

## 2021-10-30 MED ORDER — METOPROLOL TARTRATE 25 MG PO TABS: 25.0000 mg | ORAL_TABLET | Freq: Two times a day (BID) | ORAL | 1 refills | Status: DC

## 2021-10-30 MED ORDER — MONTELUKAST SODIUM 10 MG PO TABS: 10.0000 mg | ORAL_TABLET | Freq: Every day | ORAL | 3 refills | Status: DC

## 2021-10-30 NOTE — Progress Notes (Signed)
? ?Subjective:  ? Tracy Mckenzie is a 66 y.o. female who presents for Medicare Annual (Subsequent) preventive examination. ? ?Review of Systems    ? ?Cardiac Risk Factors include: advanced age (>110mn, >>97women);dyslipidemia;hypertension ? ?   ?Objective:  ?  ?Today's Vitals  ? 10/30/21 1106 10/30/21 1108  ?BP: 124/66   ?Pulse: 69   ?SpO2: 98%   ?Weight: 174 lb 9.6 oz (79.2 kg)   ?PainSc:  1   ? ?Body mass index is 28.18 kg/m?. ? ? ?  10/30/2021  ? 11:13 AM 08/01/2021  ?  4:51 PM 04/02/2021  ? 11:34 AM 11/14/2020  ? 11:06 PM 10/24/2020  ? 11:13 AM 04/14/2020  ?  9:39 AM 09/09/2019  ?  4:45 PM  ?Advanced Directives  ?Does Patient Have a Medical Advance Directive? Yes Yes No;Yes No Yes No No  ?Type of AP[place]amed[place]of [place] will [place] will  [place] will    ?Does patient want to make changes to medical advance directive?   No - Patient declined      ?Copy of [place] Chart? No - copy requested  No - copy requested  No - copy requested    ?Would patient like information on creating a medical advance directive?   No - Patient declined   No - Patient declined No - Patient declined  ? ? ?Current Medications (verified) ?Outpatient Encounter Medications as of 10/30/2021  ?Medication Sig  ? albuterol (VENTOLIN HFA) 108 (90 Base) MCG/ACT inhaler Inhale 2 puffs into the lungs every 6 (six) hours as needed for wheezing or shortness of breath.  ? azelastine (ASTELIN) 0.1 % nasal spray Place 1 spray into both nostrils 2 (two) times daily as needed for rhinitis or allergies.  ? baclofen (LIORESAL) 10 MG tablet TAKE ONE TABLET BY MOUTH THREE TIMES A DAY  ? Coenzyme Q10 (CO Q-10) 100 MG CAPS Take 100 mg by mouth daily.  ? fluticasone (CUTIVATE) 0.05 % cream Apply 1 application topically as needed (rash).  ? gabapentin (NEURONTIN) 300 MG capsule TAKE ONE CAPSULE BY MOUTH TWICE A DAY  ?  hydrochlorothiazide (MICROZIDE) 12.5 MG capsule Take 1 capsule (12.5 mg total) by mouth daily.  ? hydrOXYzine (ATARAX) 25 MG tablet Take 1 tablet (25 mg total) by mouth every 6 (six) hours.  ? levocetirizine (XYZAL) 5 MG tablet Take 5 mg by mouth 2 (two) times daily.  ? losartan (COZAAR) 50 MG tablet TAKE ONE TABLET BY MOUTH TWICE A DAY FOR FOR BLOOD PRESSURE  ? magnesium 30 MG tablet Take 90 mg by mouth daily.  ? metoprolol tartrate (LOPRESSOR) 25 MG tablet Take 1 tablet (25 mg total) by mouth 2 (two) times daily.  ? metroNIDAZOLE (METROCREAM) 0.75 % cream Apply 1 application topically 2 (two) times daily.  ? montelukast (SINGULAIR) 10 MG tablet Take 1 tablet (10 mg total) by mouth at bedtime.  ? nitroGLYCERIN (NITROSTAT) 0.4 MG SL tablet Place 1 tablet (0.4 mg total) under the tongue every 5 (five) minutes as needed for chest pain.  ? omeprazole (PRILOSEC) 40 MG capsule TAKE ONE CAPSULE BY MOUTH DAILY 30 MINUTES BEFORE BREAKFAST  ? ondansetron (ZOFRAN-ODT) 4 MG disintegrating tablet '4mg'$  ODT q4 hours prn nausea/vomit  ? Potassium 99 MG TABS Take 99 mg by mouth daily.  ? QUERCETIN PO Take 1 tablet by mouth daily.  ? Spacer/Aero-Holding Chambers (PRO COMFORT SPACER ADULT) MISC 1 Device by Does  not apply route as needed.  ? tacrolimus (PROTOPIC) 0.1 % ointment Apply 1 application topically as needed (rash).  ? traMADol-acetaminophen (ULTRACET) 37.5-325 MG tablet Take 1 tablet by mouth every 8 (eight) hours as needed.  ? zinc gluconate 50 MG tablet Take 50 mg by mouth daily.  ? benzonatate (TESSALON PERLES) 100 MG capsule 1-2 capsules up to twice daily as needed for cough (Patient not taking: Reported on 10/30/2021)  ? chlorpheniramine-HYDROcodone (TUSSIONEX PENNKINETIC ER) 10-8 MG/5ML SUER Take 5 mLs by mouth every 12 (twelve) hours as needed for cough. (Patient not taking: Reported on 10/30/2021)  ? [DISCONTINUED] losartan (COZAAR) 50 MG tablet TAKE ONE TABLET BY MOUTH TWICE A DAY FOR FOR BLOOD PRESSURE  ?  [DISCONTINUED] metoprolol succinate (TOPROL-XL) 25 MG 24 hr tablet TAKE ONE TABLET BY MOUTH DAILY  ? [DISCONTINUED] montelukast (SINGULAIR) 10 MG tablet Take 1 tablet (10 mg total) by mouth at bedtime.  ? [DISCONTINUED] Multiple Vitamins-Minerals (PRESERVISION AREDS PO) Take 1 tablet by mouth daily.  ? [DISCONTINUED] promethazine (PHENERGAN) 25 MG suppository Place 1 suppository (25 mg total) rectally every 6 (six) hours as needed for nausea or vomiting. (Patient not taking: Reported on 10/30/2021)  ? [DISCONTINUED] SUPER B COMPLEX/C PO Take 1 tablet by mouth daily.  ? ?Facility-Administered Encounter Medications as of 10/30/2021  ?Medication  ? 0.9 %  sodium chloride infusion  ? ? ?Allergies (verified) ?Meperidine, Ibuprofen, Simvastatin, Oxycodone-acetaminophen, Prolia [denosumab], and Vicodin [hydrocodone-acetaminophen]  ? ?History: ?Past Medical History:  ?Diagnosis Date  ? Arthritis   ? Bleeding ulcer   ? Elevated cholesterol   ? Hiatal hernia   ? High blood pressure   ? Kidney cysts   ? Liver cyst   ? Nerve pain   ? L sciatic pain radiating to R  ? Peptic ulcer disease   ? ?Past Surgical History:  ?Procedure Laterality Date  ? APPENDECTOMY  1992  ? BREAST CYST ASPIRATION Left 2003  ? KNEE ARTHROSCOPY Right 04/12/2001  ? torn cartilage  ? LAPAROSCOPY  1981  ? for endometriosis  ? LEG SURGERY Right 11/23/2000  ? to remove screws  ? LEG SURGERY Right 02/08/2002  ? to remove rod  ? MENISCUS REPAIR  01/29/2003  ? TIBIA FRACTURE SURGERY Right 06/12/2000  ? Multiple surgeries  ? TONSILLECTOMY  1967  ? TOTAL ABDOMINAL HYSTERECTOMY  1992  ? ?Family History  ?Problem Relation Age of Onset  ? Kidney disease Mother   ? COPD Mother   ? Breast cancer Maternal Aunt   ? Breast cancer Paternal Aunt   ? COPD Father   ? Diabetes Maternal Grandfather   ? Stroke Paternal Grandfather   ? Colon cancer Neg Hx   ? ?Social History  ? ?Socioeconomic History  ? Marital status: Married  ?  Spouse name: Not on file  ? Number of children: Not  on file  ? Years of education: Not on file  ? Highest education level: Not on file  ?Occupational History  ? Not on file  ?Tobacco Use  ? Smoking status: Never  ? Smokeless tobacco: Never  ?Vaping Use  ? Vaping Use: Never used  ?Substance and Sexual Activity  ? Alcohol use: No  ? Drug use: No  ? Sexual activity: Not on file  ?Other Topics Concern  ? Not on file  ?Social History Narrative  ? Not on file  ? ?Social Determinants of Health  ? ?Financial Resource Strain: Low Risk   ? Difficulty of Paying Living Expenses: Not hard  at all  ?Food Insecurity: No Food Insecurity  ? Worried About Charity fundraiser in the Last Year: Never true  ? Ran Out of Food in the Last Year: Never true  ?Transportation Needs: No Transportation Needs  ? Lack of Transportation (Medical): No  ? Lack of Transportation (Non-Medical): No  ?Physical Activity: Inactive  ? Days of Exercise per Week: 0 days  ? Minutes of Exercise per Session: 0 min  ?Stress: Stress Concern Present  ? Feeling of Stress : To some extent  ?Social Connections: Moderately Integrated  ? Frequency of Communication with Friends and Family: More than three times a week  ? Frequency of Social Gatherings with Friends and Family: More than three times a week  ? Attends Religious Services: 1 to 4 times per year  ? Active Member of Clubs or Organizations: No  ? Attends Archivist Meetings: Never  ? Marital Status: Married  ? ? ?Tobacco Counseling ?Counseling given: Not Answered ? ? ?Clinical Intake: ? ?Pre-visit preparation completed: Yes ? ?Pain : 0-10 ?Pain Score: 1  ?Pain Type: Chronic pain ?Pain Location: Knee ?Pain Orientation: Right ?Pain Descriptors / Indicators: Aching ?Pain Onset: More than a month ago ?Pain Frequency: Intermittent ? ?  ? ?BMI - recorded: 28.18 ?Nutritional Status: BMI 25 -29 Overweight ?Nutritional Risks: None ?Diabetes: No ? ?How often do you need to have someone help you when you read instructions, pamphlets, or other written materials  from your doctor or pharmacy?: 1 - Never ? ?Diabetic?no ? ?Interpreter Needed?: No ? ?Information entered by :: Charlott Rakes, LPN ? ? ?Activities of Daily Living ? ?  10/30/2021  ? 11:14 AM  ?In your present state of health, do yo

## 2021-10-30 NOTE — Patient Instructions (Addendum)
Great work! Blood pressure improving well!! ?Add Lopressor 25 mg twice daily to your regimen. ?Monitor at home. ?Call if any concerns. ? ?See you back in 3 months for recheck / fasting labs.  ?

## 2021-10-30 NOTE — Progress Notes (Signed)
? ?Subjective:  ? ? Patient ID: Tracy Mckenzie, female    DOB: 1955-10-20, 66 y.o.   MRN: [id number] ? ?Chief Complaint  ?Patient presents with  ? Follow-up  ? Hypertension  ? ? ?HPI ?Patient is in today for recheck on HTN.  ? ?Blood pressure readings - mornings have been lower than the evenings.  ?124/65, that evening 139/73 ? ?Overall avg is 148/76 ?Mornings 145/76 ?Evenings 151/77 ? ?Current medications:  ?Losartan 50 mg BID ?Toprol XL 25 mg ?HCTZ 12.5 mg  ? ?Asymptomatic. No other concerns.  ? ?Past Medical History:  ?Diagnosis Date  ? Arthritis   ? Bleeding ulcer   ? Elevated cholesterol   ? Hiatal hernia   ? High blood pressure   ? Kidney cysts   ? Liver cyst   ? Nerve pain   ? L sciatic pain radiating to R  ? Peptic ulcer disease   ? ? ?Past Surgical History:  ?Procedure Laterality Date  ? APPENDECTOMY  1992  ? BREAST CYST ASPIRATION Left 2003  ? KNEE ARTHROSCOPY Right 04/12/2001  ? torn cartilage  ? LAPAROSCOPY  1981  ? for endometriosis  ? LEG SURGERY Right 11/23/2000  ? to remove screws  ? LEG SURGERY Right 02/08/2002  ? to remove rod  ? MENISCUS REPAIR  01/29/2003  ? TIBIA FRACTURE SURGERY Right 06/12/2000  ? Multiple surgeries  ? TONSILLECTOMY  1967  ? TOTAL ABDOMINAL HYSTERECTOMY  1992  ? ? ?Family History  ?Problem Relation Age of Onset  ? Kidney disease Mother   ? COPD Mother   ? Breast cancer Maternal Aunt   ? Breast cancer Paternal Aunt   ? COPD Father   ? Diabetes Maternal Grandfather   ? Stroke Paternal Grandfather   ? Colon cancer Neg Hx   ? ? ?Social History  ? ?Tobacco Use  ? Smoking status: Never  ? Smokeless tobacco: Never  ?Vaping Use  ? Vaping Use: Never used  ?Substance Use Topics  ? Alcohol use: No  ? Drug use: No  ?  ? ?Allergies  ?Allergen Reactions  ? Meperidine Palpitations and Anaphylaxis  ?  flushed  ? Ibuprofen Other (See Comments)  ?  Stomach ulcers.  ? Simvastatin   ?  Other reaction(s): Muscle Pain  ? Oxycodone-Acetaminophen Itching and Rash  ? Prolia [Denosumab] Rash  ? Vicodin  [Hydrocodone-Acetaminophen] Itching  ? ? ?Review of Systems ?NEGATIVE UNLESS OTHERWISE INDICATED IN HPI ? ? ?   ?Objective:  ?  ? ?BP 124/66   Pulse 74   Temp 98.2 ?F (36.8 ?C)   Resp 16   Ht '5\' 6"'$  (1.676 m)   Wt 174 lb 6.4 oz (79.1 kg)   SpO2 98%   BMI 28.15 kg/m?  ? ?Wt Readings from Last 3 Encounters:  ?10/30/21 174 lb 9.6 oz (79.2 kg)  ?10/30/21 174 lb 6.4 oz (79.1 kg)  ?09/30/21 171 lb 6.4 oz (77.7 kg)  ? ? ?BP Readings from Last 3 Encounters:  ?10/30/21 124/66  ?10/30/21 124/66  ?09/30/21 (!) 160/84  ?  ? ?Physical Exam ?Constitutional:   ?   Appearance: Normal appearance.  ?Cardiovascular:  ?   Rate and Rhythm: Normal rate and regular rhythm.  ?   Pulses: Normal pulses.  ?   Heart sounds: Normal heart sounds.  ?Pulmonary:  ?   Effort: Pulmonary effort is normal.  ?   Breath sounds: Normal breath sounds.  ?Neurological:  ?   Mental Status: She is alert.  ?Psychiatric:     ?  Mood and Affect: Mood normal.  ? ? ?   ?Assessment & Plan:  ? ?Problem List Items Addressed This Visit   ? ?  ? Cardiovascular and Mediastinum  ? Essential hypertension - Primary  ? Relevant Medications  ? losartan (COZAAR) 50 MG tablet  ? metoprolol tartrate (LOPRESSOR) 25 MG tablet  ? ? ? ?Meds ordered this encounter  ?Medications  ? losartan (COZAAR) 50 MG tablet  ?  Sig: TAKE ONE TABLET BY MOUTH TWICE A DAY FOR FOR BLOOD PRESSURE  ?  Dispense:  180 tablet  ?  Refill:  1  ? montelukast (SINGULAIR) 10 MG tablet  ?  Sig: Take 1 tablet (10 mg total) by mouth at bedtime.  ?  Dispense:  90 tablet  ?  Refill:  3  ? metoprolol tartrate (LOPRESSOR) 25 MG tablet  ?  Sig: Take 1 tablet (25 mg total) by mouth 2 (two) times daily.  ?  Dispense:  180 tablet  ?  Refill:  1  ? ? ?1. Essential hypertension ?Overall improving ?Blood pressure is to goal in office today ?Continue losartan 50 mg 1 tablet twice daily ?Cont HCTZ 12.5 mg  ?Plan to stop the Toprol XL 25 mg and instead take Lopressor 25 mg twice daily ?She will continue to monitor and  bring log to next appointment ?Plan to reach out via MyChart if any concerns ? ?Regular follow-up in 3 months for recheck and fasting labs. ? ? ?This note was prepared with assistance of Systems analyst. Occasional wrong-word or sound-a-like substitutions may have occurred due to the inherent limitations of voice recognition software. ? ? ? ?France Lusty M Keelee Yankey, PA-C ?

## 2021-10-30 NOTE — Patient Instructions (Signed)
Ms. Vary , ?Thank you for taking time to come for your Medicare Wellness Visit. I appreciate your ongoing commitment to your health goals. Please review the following plan we discussed and let me know if I can assist you in the future.  ? ?Screening recommendations/referrals: ?Colonoscopy: Done 10/24/12 repeat every 10 years pt will make appt  ?Mammogram: Done 11/14/19 repeat every year  ?Bone Density: Done 11/14/19 repeat every 2 years  ?Recommended yearly ophthalmology/optometry visit for glaucoma screening and checkup ?Recommended yearly dental visit for hygiene and checkup ? ?Vaccinations: ?Influenza vaccine: Done 05/20/21 repeat every year  ?Pneumococcal vaccine: Due  ?Tdap vaccine: Done 05/02/14 repeat every 10 years  ?Shingles vaccine: Shingrix discussed. Please contact your pharmacy for coverage information.    ?Covid-19:Completed 2/13, 10/13/19 ? ?Advanced directives: Please bring a copy of your health care power of attorney and living will to the office at your convenience. ? ?Conditions/risks identified: Lose weight  ? ?Next appointment: Follow up in one year for your annual wellness visit  ? ? ?Preventive Care 68 Years and Older, Female ?Preventive care refers to lifestyle choices and visits with your health care provider that can promote health and wellness. ?What does preventive care include? ?A yearly physical exam. This is also called an annual well check. ?Dental exams once or twice a year. ?Routine eye exams. Ask your health care provider how often you should have your eyes checked. ?Personal lifestyle choices, including: ?Daily care of your teeth and gums. ?Regular physical activity. ?Eating a healthy diet. ?Avoiding tobacco and drug use. ?Limiting alcohol use. ?Practicing safe sex. ?Taking low-dose aspirin every day. ?Taking vitamin and mineral supplements as recommended by your health care provider. ?What happens during an annual well check? ?The services and screenings done by your health care  provider during your annual well check will depend on your age, overall health, lifestyle risk factors, and family history of disease. ?Counseling  ?Your health care provider may ask you questions about your: ?Alcohol use. ?Tobacco use. ?Drug use. ?Emotional well-being. ?Home and relationship well-being. ?Sexual activity. ?Eating habits. ?History of falls. ?Memory and ability to understand (cognition). ?Work and work Statistician. ?Reproductive health. ?Screening  ?You may have the following tests or measurements: ?Height, weight, and BMI. ?Blood pressure. ?Lipid and cholesterol levels. These may be checked every 5 years, or more frequently if you are over 68 years old. ?Skin check. ?Lung cancer screening. You may have this screening every year starting at age 68 if you have a 30-pack-year history of smoking and currently smoke or have quit within the past 15 years. ?Fecal occult blood test (FOBT) of the stool. You may have this test every year starting at age 68. ?Flexible sigmoidoscopy or colonoscopy. You may have a sigmoidoscopy every 5 years or a colonoscopy every 10 years starting at age 68. ?Hepatitis C blood test. ?Hepatitis B blood test. ?Sexually transmitted disease (STD) testing. ?Diabetes screening. This is done by checking your blood sugar (glucose) after you have not eaten for a while (fasting). You may have this done every 1-3 years. ?Bone density scan. This is done to screen for osteoporosis. You may have this done starting at age 68. ?Mammogram. This may be done every 1-2 years. Talk to your health care provider about how often you should have regular mammograms. ?Talk with your health care provider about your test results, treatment options, and if necessary, the need for more tests. ?Vaccines  ?Your health care provider may recommend certain vaccines, such as: ?Influenza vaccine. This  is recommended every year. ?Tetanus, diphtheria, and acellular pertussis (Tdap, Td) vaccine. You may need a Td  booster every 10 years. ?Zoster vaccine. You may need this after age 18. ?Pneumococcal 13-valent conjugate (PCV13) vaccine. One dose is recommended after age 68. ?Pneumococcal polysaccharide (PPSV23) vaccine. One dose is recommended after age 68. ?Talk to your health care provider about which screenings and vaccines you need and how often you need them. ?This information is not intended to replace advice given to you by your health care provider. Make sure you discuss any questions you have with your health care provider. ?Document Released: 08/15/2015 Document Revised: 04/07/2016 Document Reviewed: 05/20/2015 ?Elsevier Interactive Patient Education ? 2017 [place]. ? ?Fall Prevention in the Home ?Falls can cause injuries. They can happen to people of all ages. There are many things you can do to make your home safe and to help prevent falls. ?What can I do on the outside of my home? ?Regularly fix the edges of walkways and driveways and fix any cracks. ?Remove anything that might make you trip as you walk through a door, such as a raised step or threshold. ?Trim any bushes or trees on the path to your home. ?Use bright outdoor lighting. ?Clear any walking paths of anything that might make someone trip, such as rocks or tools. ?Regularly check to see if handrails are loose or broken. Make sure that both sides of any steps have handrails. ?Any raised decks and porches should have guardrails on the edges. ?Have any leaves, snow, or ice cleared regularly. ?Use sand or salt on walking paths during winter. ?Clean up any spills in your garage right away. This includes oil or grease spills. ?What can I do in the bathroom? ?Use night lights. ?Install grab bars by the toilet and in the tub and shower. Do not use towel bars as grab bars. ?Use non-skid mats or decals in the tub or shower. ?If you need to sit down in the shower, use a plastic, non-slip stool. ?Keep the floor dry. Clean up any water that spills on the floor  as soon as it happens. ?Remove soap buildup in the tub or shower regularly. ?Attach bath mats securely with double-sided non-slip rug tape. ?Do not have throw rugs and other things on the floor that can make you trip. ?What can I do in the bedroom? ?Use night lights. ?Make sure that you have a light by your bed that is easy to reach. ?Do not use any sheets or blankets that are too big for your bed. They should not hang down onto the floor. ?Have a firm chair that has side arms. You can use this for support while you get dressed. ?Do not have throw rugs and other things on the floor that can make you trip. ?What can I do in the kitchen? ?Clean up any spills right away. ?Avoid walking on wet floors. ?Keep items that you use a lot in easy-to-reach places. ?If you need to reach something above you, use a strong step stool that has a grab bar. ?Keep electrical cords out of the way. ?Do not use floor polish or wax that makes floors slippery. If you must use wax, use non-skid floor wax. ?Do not have throw rugs and other things on the floor that can make you trip. ?What can I do with my stairs? ?Do not leave any items on the stairs. ?Make sure that there are handrails on both sides of the stairs and use them. Fix handrails that  are broken or loose. Make sure that handrails are as long as the stairways. ?Check any carpeting to make sure that it is firmly attached to the stairs. Fix any carpet that is loose or worn. ?Avoid having throw rugs at the top or bottom of the stairs. If you do have throw rugs, attach them to the floor with carpet tape. ?Make sure that you have a light switch at the top of the stairs and the bottom of the stairs. If you do not have them, ask someone to add them for you. ?What else can I do to help prevent falls? ?Wear shoes that: ?Do not have high heels. ?Have rubber bottoms. ?Are comfortable and fit you well. ?Are closed at the toe. Do not wear sandals. ?If you use a stepladder: ?Make sure that it is  fully opened. Do not climb a closed stepladder. ?Make sure that both sides of the stepladder are locked into place. ?Ask someone to hold it for you, if possible. ?Clearly mark and make sure that you can see

## 2021-11-02 ENCOUNTER — Other Ambulatory Visit: Payer: Self-pay

## 2021-11-02 ENCOUNTER — Encounter: Payer: Self-pay | Admitting: Physician Assistant

## 2021-11-02 ENCOUNTER — Ambulatory Visit (INDEPENDENT_AMBULATORY_CARE_PROVIDER_SITE_OTHER): Payer: Medicare HMO | Admitting: Physician Assistant

## 2021-11-02 VITALS — BP 130/80 | HR 64 | Temp 98.0°F | Wt 176.4 lb

## 2021-11-02 DIAGNOSIS — L309 Dermatitis, unspecified: Secondary | ICD-10-CM | POA: Diagnosis not present

## 2021-11-02 DIAGNOSIS — D485 Neoplasm of uncertain behavior of skin: Secondary | ICD-10-CM

## 2021-11-02 DIAGNOSIS — L989 Disorder of the skin and subcutaneous tissue, unspecified: Secondary | ICD-10-CM

## 2021-11-02 NOTE — Progress Notes (Signed)
? ?Subjective:  ? ? Patient ID: Tracy Mckenzie, female    DOB: [birth year], 66 y.o.   MRN: [id number] ? ?Chief Complaint  ?Patient presents with  ? Biopsy  ?  Left leg- no other concerns  ? ? ?HPI ?Patient is in today for concern about lesion changing left lower leg. States it used to be completely flat, now noticing some raised area with pink color. Starting to itch as well. No hx melanoma.  ? ?Past Medical History:  ?Diagnosis Date  ? Arthritis   ? Bleeding ulcer   ? Elevated cholesterol   ? Hiatal hernia   ? High blood pressure   ? Kidney cysts   ? Liver cyst   ? Nerve pain   ? L sciatic pain radiating to R  ? Peptic ulcer disease   ? ? ?Past Surgical History:  ?Procedure Laterality Date  ? APPENDECTOMY  1992  ? BREAST CYST ASPIRATION Left 2003  ? KNEE ARTHROSCOPY Right 04/12/2001  ? torn cartilage  ? LAPAROSCOPY  1981  ? for endometriosis  ? LEG SURGERY Right 11/23/2000  ? to remove screws  ? LEG SURGERY Right 02/08/2002  ? to remove rod  ? MENISCUS REPAIR  01/29/2003  ? TIBIA FRACTURE SURGERY Right 06/12/2000  ? Multiple surgeries  ? TONSILLECTOMY  1967  ? TOTAL ABDOMINAL HYSTERECTOMY  1992  ? ? ?Family History  ?Problem Relation Age of Onset  ? Kidney disease Mother   ? COPD Mother   ? Breast cancer Maternal Aunt   ? Breast cancer Paternal Aunt   ? COPD Father   ? Diabetes Maternal Grandfather   ? Stroke Paternal Grandfather   ? Colon cancer Neg Hx   ? ? ?Social History  ? ?Tobacco Use  ? Smoking status: Never  ? Smokeless tobacco: Never  ?Vaping Use  ? Vaping Use: Never used  ?Substance Use Topics  ? Alcohol use: No  ? Drug use: No  ?  ? ?Allergies  ?Allergen Reactions  ? Meperidine Palpitations and Anaphylaxis  ?  flushed  ? Ibuprofen Other (See Comments)  ?  Stomach ulcers.  ? Simvastatin   ?  Other reaction(s): Muscle Pain  ? Oxycodone-Acetaminophen Itching and Rash  ? Prolia [Denosumab] Rash  ? Vicodin [Hydrocodone-Acetaminophen] Itching  ? ? ?Review of Systems ?NEGATIVE UNLESS OTHERWISE INDICATED IN  HPI ? ? ?   ?Objective:  ?  ? ?BP 130/80   Pulse 64   Temp 98 ?F (36.7 ?C)   Wt 176 lb 6.4 oz (80 kg)   SpO2 97%   BMI 28.47 kg/m?  ? ?Wt Readings from Last 3 Encounters:  ?11/02/21 176 lb 6.4 oz (80 kg)  ?10/30/21 174 lb 9.6 oz (79.2 kg)  ?10/30/21 174 lb 6.4 oz (79.1 kg)  ? ? ?BP Readings from Last 3 Encounters:  ?11/02/21 130/80  ?10/30/21 124/66  ?10/30/21 124/66  ?  ? ?Physical Exam ? ?Anterior left lower extremity - see photo ? ? ? ? ?   ?Assessment & Plan:  ? ?Problem List Items Addressed This Visit   ?None ?Visit Diagnoses   ? ? Neoplasm of uncertain behavior of skin    -  Primary  ? Relevant Orders  ? Surgical pathology( [facility]/ [facility])  ? Changing skin lesion      ? Relevant Orders  ? Surgical pathology( [facility]/ [facility])  ? ?  ? ? ?1. Neoplasm of uncertain behavior of skin ?2. Changing skin lesion ? ?Left anterior shin lesion ? ?  Procedure explained and consent obtained. Area cleaned in usual manner. Local anesthesia with 3 cc of Lidocaine 1% with Epi, using a 25 gauge needle. 3 mm punch used to remove the lesion from left anterior shin. Hemostasis with silver nitrate. Non-stick bandage, Coban, with Vaseline applied to dress wound. Patient tolerated procedure well. Aftercare instructions provided. Lesion sent for pathology.  ? ? ?Olamae Ferrara M Brently Voorhis, PA-C ?

## 2021-11-06 ENCOUNTER — Encounter: Payer: Self-pay | Admitting: Physician Assistant

## 2021-11-17 ENCOUNTER — Ambulatory Visit (INDEPENDENT_AMBULATORY_CARE_PROVIDER_SITE_OTHER): Payer: Medicare HMO | Admitting: *Deleted

## 2021-11-17 DIAGNOSIS — I10 Essential (primary) hypertension: Secondary | ICD-10-CM

## 2021-11-17 DIAGNOSIS — E782 Mixed hyperlipidemia: Secondary | ICD-10-CM

## 2021-11-17 NOTE — Patient Instructions (Signed)
Visit Information ? ?Thank you for taking time to visit with me today. Please don't hesitate to contact me if I can be of assistance to you before our next scheduled telephone appointment. ? ?Following are the goals we discussed today:  ?Patient will self administer medications as prescribed ?Patient will attend all scheduled provider appointments ?Patient will call pharmacy for medication refills ?Patient will call provider office for new concerns or questions ?Check blood pressure 3 times per week ?Write blood pressure results in a log and take to provider for review ?Low salt low fat low cholesterol diet ?Increase activity as tolerated ?Check blood pressure 3 times per week ?Write blood pressure results in a log and take to provider for review ?Low salt low fat low cholesterol diet ?Increase activity as tolerated ?Contact PCP for continued elevated blood pressures ? ?Our next appointment is by telephone on 5/25 at 1030 ? ?Please call the care guide team at [phone] if you need to cancel or reschedule your appointment.  ? ?If you are experiencing a Mental Health or [place] or need someone to talk to, please call the Suicide and Crisis Lifeline: 988 ?call the [place] National Suicide Prevention Lifeline: 380-372-6881 or TTY: 732-096-4446 TTY 304-887-9024) to talk to a trained counselor ?call 1-800-273-TALK (toll free, 24 hour hotline) ?go to [facility] [address] [place] [phone])  ? ?Patient verbalizes understanding of instructions and care plan provided today and agrees to view in [place]. Active MyChart status confirmed with patient.   ? ?Hubert Azure RN, MSN ?RN Care Management Coordinator ?[place] ?[phone] ?[email or web address] ? ?

## 2021-11-17 NOTE — Chronic Care Management (AMB) (Signed)
?Chronic Care Management  ? ?CCM RN Visit Note ? ?11/17/2021 ?Name: Tracy Mckenzie MRN: [id number] DOB: 04/29/1956 ? ?Subjective: ?Tracy Mckenzie is a 66 y.o. year old female who is a primary care patient of Allwardt, Alyssa M, PA-C. The care management team was consulted for assistance with disease management and care coordination needs.   ? ?Engaged with patient by telephone for follow up visit in response to provider referral for case management and/or care coordination services.  ? ?Consent to Services:  ?The patient was given the following information about Chronic Care Management services today, agreed to services, and gave verbal consent: 1. CCM service includes personalized support from designated clinical staff supervised by the primary care provider, including individualized plan of care and coordination with other care providers 2. 24/7 contact phone numbers for assistance for urgent and routine care needs. 3. Service will only be billed when office clinical staff spend 20 minutes or more in a month to coordinate care. 4. Only one practitioner may furnish and bill the service in a calendar month. 5.The patient may stop CCM services at any time (effective at the end of the month) by phone call to the office staff. 6. The patient will be responsible for cost sharing (co-pay) of up to 20% of the service fee (after annual deductible is met). Patient agreed to services and consent obtained. ? ?Patient agreed to services and verbal consent obtained.  ? ?Assessment: Review of patient past medical history, allergies, medications, health status, including review of consultants reports, laboratory and other test data, was performed as part of comprehensive evaluation and provision of chronic care management services.  ? ?SDOH (Social Determinants of Health) assessments and interventions performed:   ? ?CCM Care Plan ? ?Allergies  ?Allergen Reactions  ? Meperidine Palpitations and Anaphylaxis  ?  flushed  ? Ibuprofen  Other (See Comments)  ?  Stomach ulcers.  ? Simvastatin   ?  Other reaction(s): Muscle Pain  ? Oxycodone-Acetaminophen Itching and Rash  ? Prolia [Denosumab] Rash  ? Vicodin [Hydrocodone-Acetaminophen] Itching  ? ? ?Outpatient Encounter Medications as of 11/17/2021  ?Medication Sig  ? albuterol (VENTOLIN HFA) 108 (90 Base) MCG/ACT inhaler Inhale 2 puffs into the lungs every 6 (six) hours as needed for wheezing or shortness of breath.  ? azelastine (ASTELIN) 0.1 % nasal spray Place 1 spray into both nostrils 2 (two) times daily as needed for rhinitis or allergies.  ? baclofen (LIORESAL) 10 MG tablet TAKE ONE TABLET BY MOUTH THREE TIMES A DAY  ? benzonatate (TESSALON PERLES) 100 MG capsule 1-2 capsules up to twice daily as needed for cough  ? chlorpheniramine-HYDROcodone (TUSSIONEX PENNKINETIC ER) 10-8 MG/5ML SUER Take 5 mLs by mouth every 12 (twelve) hours as needed for cough. (Patient not taking: Reported on 11/02/2021)  ? Coenzyme Q10 (CO Q-10) 100 MG CAPS Take 100 mg by mouth daily.  ? fluticasone (CUTIVATE) 0.05 % cream Apply 1 application topically as needed (rash).  ? gabapentin (NEURONTIN) 300 MG capsule TAKE ONE CAPSULE BY MOUTH TWICE A DAY  ? hydrochlorothiazide (MICROZIDE) 12.5 MG capsule Take 1 capsule (12.5 mg total) by mouth daily.  ? hydrOXYzine (ATARAX) 25 MG tablet Take 1 tablet (25 mg total) by mouth every 6 (six) hours.  ? levocetirizine (XYZAL) 5 MG tablet Take 5 mg by mouth 2 (two) times daily.  ? losartan (COZAAR) 50 MG tablet TAKE ONE TABLET BY MOUTH TWICE A DAY FOR FOR BLOOD PRESSURE  ? magnesium 30 MG tablet Take 90 mg by  mouth daily.  ? metoprolol tartrate (LOPRESSOR) 25 MG tablet Take 1 tablet (25 mg total) by mouth 2 (two) times daily.  ? metroNIDAZOLE (METROCREAM) 0.75 % cream Apply 1 application topically 2 (two) times daily.  ? montelukast (SINGULAIR) 10 MG tablet Take 1 tablet (10 mg total) by mouth at bedtime.  ? nitroGLYCERIN (NITROSTAT) 0.4 MG SL tablet Place 1 tablet (0.4 mg total)  under the tongue every 5 (five) minutes as needed for chest pain.  ? omeprazole (PRILOSEC) 40 MG capsule TAKE ONE CAPSULE BY MOUTH DAILY 30 MINUTES BEFORE BREAKFAST  ? ondansetron (ZOFRAN-ODT) 4 MG disintegrating tablet 65m ODT q4 hours prn nausea/vomit  ? Potassium 99 MG TABS Take 99 mg by mouth daily.  ? QUERCETIN PO Take 1 tablet by mouth daily.  ? Spacer/Aero-Holding Chambers (PRO COMFORT SPACER ADULT) MISC 1 Device by Does not apply route as needed.  ? tacrolimus (PROTOPIC) 0.1 % ointment Apply 1 application topically as needed (rash).  ? traMADol-acetaminophen (ULTRACET) 37.5-325 MG tablet Take 1 tablet by mouth every 8 (eight) hours as needed.  ? zinc gluconate 50 MG tablet Take 50 mg by mouth daily.  ? ?Facility-Administered Encounter Medications as of 11/17/2021  ?Medication  ? 0.9 %  sodium chloride infusion  ? ? ?Patient Active Problem List  ? Diagnosis Date Noted  ? Back pain 11/27/2020  ? Osteopenia 11/27/2020  ? Sciatica 11/27/2020  ? Surgical menopause 11/27/2020  ? Tibia/fibula fracture 11/27/2020  ? Unilateral primary osteoarthritis, right knee 09/05/2019  ? Hyperlipidemia 08/15/2019  ? Essential hypertension 08/13/2019  ? GERD (gastroesophageal reflux disease) 08/13/2019  ? Osteoporosis 08/13/2019  ? Post-traumatic osteoarthritis of right knee 08/13/2019  ? Hiatal hernia 08/13/2019  ? Chronic left-sided low back pain without sciatica 08/13/2019  ? ? ?Conditions to be addressed/monitored:HTN and HLD ? ?Care Plan : CCM RNCM Care Plan  ?Updates made by TLeona Singleton RN since 11/17/2021 12:00 AM  ?  ? ?Problem: Knowledge deficit related to self care management of hypertension and hyperlipidemia   ?Priority: Medium  ?  ? ?Long-Range Goal: Patient will work with the CCM team to better manager hypertension and hyperlipidemia   ?Start Date: 04/02/2021  ?Expected End Date: 10/30/2021  ?Recent Progress: Not on track  ?Priority: Medium  ?Note:   ?Current Barriers:  ?Knowledge Deficits related to plan of care  for management of HTN and HLD  patient reporting normally checking blood pressures a few times a week with normal ranges of 140/70's.  Continues to check pressures a few times a week.  States it has been running normal, unsure of exact numbers and machine is on charger right now.  Does endorse ED visit on 12/31 related to black emesis with history of ulcers.  States there has been stomach bug infecting the entire home.  Reports she is able to stay hydrated, has no appetite, tolerating and keeping down soup.  Is taking Zofran ? ?1/26--Patient stating she feels much better. N/V/D is resolved and patient able to keep food, liquids and meds down.  Feels  back to normal self.  Does report ?blood pressure was elevated, especially when not ado keep medications down; states it is better but does not remember exact number. ? ?3/9--states she is doing fair; recovered from [place] now sinuses/allergies are acting up.  Taking here allergy medicine and trying to gain her stregnth back. Concerned about elevated bps recently.  bp this morning was 165/82 with recent ranges of 150-160/60-70's.  ? ?4/18--Reports she is doing  better, just has her normal allergies.  BP 128/72, with recent ranges of 120-130/60-70's.  Denies any chest pain, swelling, SOB, questions or concerns.  Does report trying beet root to help with her lipids. ? ? ?RNCM Clinical Goal(s):  ?Patient will verbalize understanding of plan for management of HTN and HLD ?continue to work with RN Care Manager to address care management and care coordination needs related to HTN and HLD  through collaboration with RN Care manager, provider, and care team.  ? ?Interventions: ?1:1 collaboration with primary care provider regarding development and update of comprehensive plan of care as evidenced by provider attestation and co-signature ?Inter-disciplinary care team collaboration (see longitudinal plan of care) ?Evaluation of current treatment plan related to  self management  and patient's adherence to plan as established by provider ? ? ?Hyperlipidemia:  (Status: Goal on Track (progressing): YES. Condition stable. Not addressed this visit.) ?Lab Results  ?Component Value Dat

## 2021-11-26 ENCOUNTER — Encounter: Payer: Self-pay | Admitting: Family

## 2021-11-26 ENCOUNTER — Ambulatory Visit (INDEPENDENT_AMBULATORY_CARE_PROVIDER_SITE_OTHER): Payer: Medicare HMO | Admitting: Family

## 2021-11-26 VITALS — BP 148/84 | HR 62 | Temp 97.9°F | Ht 66.0 in | Wt 177.0 lb

## 2021-11-26 DIAGNOSIS — S2341XA Sprain of ribs, initial encounter: Secondary | ICD-10-CM

## 2021-11-26 MED ORDER — TRAMADOL-ACETAMINOPHEN 37.5-325 MG PO TABS: 1.0000 | ORAL_TABLET | Freq: Three times a day (TID) | ORAL | 0 refills | Status: DC | PRN

## 2021-11-26 MED ORDER — HYDROXYZINE HCL 10 MG PO TABS: 10.0000 mg | ORAL_TABLET | Freq: Four times a day (QID) | ORAL | 2 refills | Status: DC

## 2021-11-26 NOTE — Progress Notes (Signed)
? ?Subjective:  ? ? ? Patient ID: Tracy Mckenzie, female    DOB: Apr 08, 1956, 66 y.o.   MRN: [id number] ? ?Chief Complaint  ?Patient presents with  ? Abdominal Pain  ?  Pt c/o right sided rib pain for a week, Pt states she was sitting on sofa and bent over to pick up something on the floor and felt a sharp pain. Mostly when she's moving.  ? ?HPI: ?Rib strain:  pt reports leaning over and felt a pull in her right ribs about 10 days ago and has had severe pain since then. Denies any SOB or pain with deep breathing. She has taken her Tramadol with some relief and Baclofen that did not help. All pain meds require her to take Hydroxyzine or she has bad itching.  ? ?Assessment & Plan:  ? ?Problem List Items Addressed This Visit   ?None ?Visit Diagnoses   ? ? Sprain of costal cartilage, initial encounter    -  Primary ?She has taken her Tramadol with some relief and Baclofen that did not help. All pain meds require her to take Hydroxyzine or she has bad itching. Refilling Tramadol and Hydroxyzine. Advised to use ice for acute pain and heat all other times for healing and pain relief. Advised for next 2 weeks, she must refrain from any activity that causes twisting, bending of her torso, no heavy lifting, use cane in left hand if stable to do.  ? ?  ? ? ?Outpatient Medications Prior to Visit  ?Medication Sig Dispense Refill  ? albuterol (VENTOLIN HFA) 108 (90 Base) MCG/ACT inhaler Inhale 2 puffs into the lungs every 6 (six) hours as needed for wheezing or shortness of breath. 18 g 1  ? azelastine (ASTELIN) 0.1 % nasal spray Place 1 spray into both nostrils 2 (two) times daily as needed for rhinitis or allergies. 30 mL 5  ? baclofen (LIORESAL) 10 MG tablet TAKE ONE TABLET BY MOUTH THREE TIMES A DAY 60 tablet 1  ? benzonatate (TESSALON PERLES) 100 MG capsule 1-2 capsules up to twice daily as needed for cough 30 capsule 0  ? chlorpheniramine-HYDROcodone (TUSSIONEX PENNKINETIC ER) 10-8 MG/5ML SUER Take 5 mLs by mouth every 12  (twelve) hours as needed for cough. 140 mL 0  ? Coenzyme Q10 (CO Q-10) 100 MG CAPS Take 100 mg by mouth daily.    ? fluticasone (CUTIVATE) 0.05 % cream Apply 1 application topically as needed (rash).    ? gabapentin (NEURONTIN) 300 MG capsule TAKE ONE CAPSULE BY MOUTH TWICE A DAY 60 capsule 0  ? hydrochlorothiazide (MICROZIDE) 12.5 MG capsule Take 1 capsule (12.5 mg total) by mouth daily. 90 capsule 3  ? levocetirizine (XYZAL) 5 MG tablet Take 5 mg by mouth 2 (two) times daily.    ? losartan (COZAAR) 50 MG tablet TAKE ONE TABLET BY MOUTH TWICE A DAY FOR FOR BLOOD PRESSURE 180 tablet 1  ? magnesium 30 MG tablet Take 90 mg by mouth daily.    ? metoprolol tartrate (LOPRESSOR) 25 MG tablet Take 1 tablet (25 mg total) by mouth 2 (two) times daily. 180 tablet 1  ? metroNIDAZOLE (METROCREAM) 0.75 % cream Apply 1 application topically 2 (two) times daily.    ? montelukast (SINGULAIR) 10 MG tablet Take 1 tablet (10 mg total) by mouth at bedtime. 90 tablet 3  ? nitroGLYCERIN (NITROSTAT) 0.4 MG SL tablet Place 1 tablet (0.4 mg total) under the tongue every 5 (five) minutes as needed for chest pain. 15 tablet 1  ?  omeprazole (PRILOSEC) 40 MG capsule TAKE ONE CAPSULE BY MOUTH DAILY 30 MINUTES BEFORE BREAKFAST 90 capsule 3  ? ondansetron (ZOFRAN-ODT) 4 MG disintegrating tablet '4mg'$  ODT q4 hours prn nausea/vomit 20 tablet 0  ? Potassium 99 MG TABS Take 99 mg by mouth daily.    ? QUERCETIN PO Take 1 tablet by mouth daily.    ? Spacer/Aero-Holding Chambers (PRO COMFORT SPACER ADULT) MISC 1 Device by Does not apply route as needed. 1 each 1  ? tacrolimus (PROTOPIC) 0.1 % ointment Apply 1 application topically as needed (rash).    ? zinc gluconate 50 MG tablet Take 50 mg by mouth daily.    ? hydrOXYzine (ATARAX) 25 MG tablet Take 1 tablet (25 mg total) by mouth every 6 (six) hours. 90 tablet 0  ? traMADol-acetaminophen (ULTRACET) 37.5-325 MG tablet Take 1 tablet by mouth every 8 (eight) hours as needed. 30 tablet 0   ? ?Facility-Administered Medications Prior to Visit  ?Medication Dose Route Frequency Provider Last Rate Last Admin  ? 0.9 %  sodium chloride infusion  500 mL Intravenous Continuous Danis, Kirke Corin, MD      ? ? ?Past Medical History:  ?Diagnosis Date  ? Arthritis   ? Bleeding ulcer   ? Elevated cholesterol   ? Hiatal hernia   ? High blood pressure   ? Kidney cysts   ? Liver cyst   ? Nerve pain   ? L sciatic pain radiating to R  ? Peptic ulcer disease   ? ? ?Past Surgical History:  ?Procedure Laterality Date  ? APPENDECTOMY  1992  ? BREAST CYST ASPIRATION Left 2003  ? KNEE ARTHROSCOPY Right 04/12/2001  ? torn cartilage  ? LAPAROSCOPY  1981  ? for endometriosis  ? LEG SURGERY Right 11/23/2000  ? to remove screws  ? LEG SURGERY Right 02/08/2002  ? to remove rod  ? MENISCUS REPAIR  01/29/2003  ? TIBIA FRACTURE SURGERY Right 06/12/2000  ? Multiple surgeries  ? TONSILLECTOMY  1967  ? TOTAL ABDOMINAL HYSTERECTOMY  1992  ? ? ?Allergies  ?Allergen Reactions  ? Meperidine Palpitations and Anaphylaxis  ?  flushed  ? Ibuprofen Other (See Comments)  ?  Stomach ulcers.  ? Simvastatin   ?  Other reaction(s): Muscle Pain  ? Oxycodone-Acetaminophen Itching and Rash  ? Prolia [Denosumab] Rash  ? Vicodin [Hydrocodone-Acetaminophen] Itching  ? ? ?   ?Objective:  ?  ?Physical Exam ?Vitals and nursing note reviewed.  ?Constitutional:   ?   Appearance: Normal appearance.  ?Cardiovascular:  ?   Rate and Rhythm: Normal rate and regular rhythm.  ?Pulmonary:  ?   Effort: Pulmonary effort is normal.  ?   Breath sounds: Normal breath sounds.  ?Chest:  ?   Chest wall: Tenderness (right side) present. No swelling (or bruising).  ?Musculoskeletal:     ?   General: Normal range of motion.  ?Skin: ?   General: Skin is warm and dry.  ?Neurological:  ?   Mental Status: She is alert.  ?Psychiatric:     ?   Mood and Affect: Mood normal.     ?   Behavior: Behavior normal.  ? ? ?BP (!) 148/84 (BP Location: Left Arm, Patient Position: Sitting, Cuff  Size: Large)   Pulse 62   Temp 97.9 ?F (36.6 ?C) (Temporal)   Ht '5\' 6"'$  (1.676 m)   Wt 177 lb (80.3 kg)   SpO2 99%   BMI 28.57 kg/m?  ?Wt Readings from Last  3 Encounters:  ?11/26/21 177 lb (80.3 kg)  ?11/02/21 176 lb 6.4 oz (80 kg)  ?10/30/21 174 lb 9.6 oz (79.2 kg)  ? ?   ? ?Jeanie Sewer, NP ? ?

## 2021-11-26 NOTE — Patient Instructions (Signed)
It was very nice to see you today! ? ? ?I have sent refills of your Tramadol and Hydroxyzine.  The Hydroxyzine is a lower dose, ('10mg'$ ) so ok to take 2 pills at a time if needed. But 1 pill should not cause as much drowsiness. ?Apply heat up to 30 minutes 3 times per day to help with pain and healing.  ?If you have new, increased pain due to accidental stretching, apply ice for 30 minutes 3 times per day for 1 day, then resume applying heat. ?You can also apply over the counter analgesic creams, or lidocaine patches to help with the pain per directions on the box. ?Avoid any activity for the next 2 weeks that would put extra strain on your right side, no reaching up or down, twisting, or lifting heavy objects. ? ?Let us know if pain is not resolving after about 3-4 weeks. ? ? ? ?PLEASE NOTE: ? ?If you had any lab tests please let us know if you have not heard back within a few days. You may see your results on MyChart before we have a chance to review them but we will give you a call once they are reviewed by [place]. If we ordered any referrals today, please let us know if you have not heard from their office within the next week.  ? ?

## 2021-11-29 DIAGNOSIS — I10 Essential (primary) hypertension: Secondary | ICD-10-CM

## 2021-11-29 DIAGNOSIS — E782 Mixed hyperlipidemia: Secondary | ICD-10-CM

## 2021-12-24 ENCOUNTER — Ambulatory Visit (INDEPENDENT_AMBULATORY_CARE_PROVIDER_SITE_OTHER): Payer: Medicare HMO | Admitting: *Deleted

## 2021-12-24 DIAGNOSIS — I10 Essential (primary) hypertension: Secondary | ICD-10-CM

## 2021-12-24 DIAGNOSIS — E782 Mixed hyperlipidemia: Secondary | ICD-10-CM

## 2021-12-24 NOTE — Patient Instructions (Addendum)
Visit Information  Thank you for taking time to visit with me today. Please don't hesitate to contact me if I can be of assistance to you before our next scheduled telephone appointment.  Following are the goals we discussed today:  Patient will self administer medications as prescribed Patient will attend all scheduled provider appointments Patient will call pharmacy for medication refills Patient will call provider office for new concerns or questions Check blood pressure 3 times per week Write blood pressure results in a log and take to provider for review Low salt low fat low cholesterol diet Increase activity as tolerated Check blood pressure 3 times per week Write blood pressure results in a log and take to provider for review Low salt low fat low cholesterol diet Increase activity as tolerated Contact PCP for continued elevated blood pressures   Our next appointment is by telephone on 7/6 at 1000  Please call the care guide team at [phone] if you need to cancel or reschedule your appointment.   If you are experiencing a Mental Health or [place] or need someone to talk to, please call the Suicide and Crisis Lifeline: 988 call the [place] National Suicide Prevention Lifeline: 573-489-9042 or TTY: 209 167 4788 TTY (760)872-1122) to talk to a trained counselor call 1-800-273-TALK (toll free, 24 hour hotline) go to [facility] [address], [place] [phone]) call 911   Patient verbalizes understanding of instructions and care plan provided today and agrees to view in [place]. Active MyChart status and patient understanding of how to access instructions and care plan via MyChart confirmed with patient.     Hubert Azure RN, MSN RN Care Management Coordinator  [place] [phone] [email or web address]

## 2021-12-24 NOTE — Chronic Care Management (AMB) (Signed)
Chronic Care Management   CCM RN Visit Note  12/24/2021 Name: Tracy Mckenzie MRN: [id number] DOB: 1956/01/28  Subjective: Tracy Mckenzie is a 66 y.o. year old female who is a primary care patient of Allwardt, Alyssa M, PA-C. The care management team was consulted for assistance with disease management and care coordination needs.    Engaged with patient by telephone for follow up visit in response to provider referral for case management and/or care coordination services.   Consent to Services:  The patient was given information about Chronic Care Management services, agreed to services, and gave verbal consent prior to initiation of services.  Please see initial visit note for detailed documentation.   Patient agreed to services and verbal consent obtained.   Assessment: Review of patient past medical history, allergies, medications, health status, including review of consultants reports, laboratory and other test data, was performed as part of comprehensive evaluation and provision of chronic care management services.   SDOH (Social Determinants of Health) assessments and interventions performed:    CCM Care Plan  Allergies  Allergen Reactions   Meperidine Palpitations and Anaphylaxis    flushed   Ibuprofen Other (See Comments)    Stomach ulcers.   Simvastatin     Other reaction(s): Muscle Pain   Oxycodone-Acetaminophen Itching and Rash   Prolia [Denosumab] Rash   Vicodin [Hydrocodone-Acetaminophen] Itching    Outpatient Encounter Medications as of 12/24/2021  Medication Sig   albuterol (VENTOLIN HFA) 108 (90 Base) MCG/ACT inhaler Inhale 2 puffs into the lungs every 6 (six) hours as needed for wheezing or shortness of breath.   azelastine (ASTELIN) 0.1 % nasal spray Place 1 spray into both nostrils 2 (two) times daily as needed for rhinitis or allergies.   baclofen (LIORESAL) 10 MG tablet TAKE ONE TABLET BY MOUTH THREE TIMES A DAY   benzonatate (TESSALON PERLES) 100 MG capsule  1-2 capsules up to twice daily as needed for cough   chlorpheniramine-HYDROcodone (TUSSIONEX PENNKINETIC ER) 10-8 MG/5ML SUER Take 5 mLs by mouth every 12 (twelve) hours as needed for cough.   Coenzyme Q10 (CO Q-10) 100 MG CAPS Take 100 mg by mouth daily.   fluticasone (CUTIVATE) 0.05 % cream Apply 1 application topically as needed (rash).   gabapentin (NEURONTIN) 300 MG capsule TAKE ONE CAPSULE BY MOUTH TWICE A DAY   hydrochlorothiazide (MICROZIDE) 12.5 MG capsule Take 1 capsule (12.5 mg total) by mouth daily.   hydrOXYzine (ATARAX) 10 MG tablet Take 1 tablet (10 mg total) by mouth every 6 (six) hours.   levocetirizine (XYZAL) 5 MG tablet Take 5 mg by mouth 2 (two) times daily.   losartan (COZAAR) 50 MG tablet TAKE ONE TABLET BY MOUTH TWICE A DAY FOR FOR BLOOD PRESSURE   magnesium 30 MG tablet Take 90 mg by mouth daily.   metoprolol tartrate (LOPRESSOR) 25 MG tablet Take 1 tablet (25 mg total) by mouth 2 (two) times daily.   metroNIDAZOLE (METROCREAM) 0.75 % cream Apply 1 application topically 2 (two) times daily.   montelukast (SINGULAIR) 10 MG tablet Take 1 tablet (10 mg total) by mouth at bedtime.   nitroGLYCERIN (NITROSTAT) 0.4 MG SL tablet Place 1 tablet (0.4 mg total) under the tongue every 5 (five) minutes as needed for chest pain.   omeprazole (PRILOSEC) 40 MG capsule TAKE ONE CAPSULE BY MOUTH DAILY 30 MINUTES BEFORE BREAKFAST   ondansetron (ZOFRAN-ODT) 4 MG disintegrating tablet 29m ODT q4 hours prn nausea/vomit   Potassium 99 MG TABS Take 99 mg by mouth  daily.   QUERCETIN PO Take 1 tablet by mouth daily.   Spacer/Aero-Holding Chambers (PRO COMFORT SPACER ADULT) MISC 1 Device by Does not apply route as needed.   tacrolimus (PROTOPIC) 0.1 % ointment Apply 1 application topically as needed (rash).   traMADol-acetaminophen (ULTRACET) 37.5-325 MG tablet Take 1 tablet by mouth every 8 (eight) hours as needed.   zinc gluconate 50 MG tablet Take 50 mg by mouth daily.   Facility-Administered  Encounter Medications as of 12/24/2021  Medication   0.9 %  sodium chloride infusion    Patient Active Problem List   Diagnosis Date Noted   Back pain 11/27/2020   Osteopenia 11/27/2020   Sciatica 11/27/2020   Surgical menopause 11/27/2020   Tibia/fibula fracture 11/27/2020   Unilateral primary osteoarthritis, right knee 09/05/2019   Hyperlipidemia 08/15/2019   Essential hypertension 08/13/2019   GERD (gastroesophageal reflux disease) 08/13/2019   Osteoporosis 08/13/2019   Post-traumatic osteoarthritis of right knee 08/13/2019   Hiatal hernia 08/13/2019   Chronic left-sided low back pain without sciatica 08/13/2019    Conditions to be addressed/monitored:HTN and HLD  Care Plan : [place]  Updates made by Leona Singleton, RN since 12/24/2021 12:00 AM     Problem: Knowledge deficit related to self care management of hypertension and hyperlipidemia   Priority: Medium     Long-Range Goal: Patient will work with the CCM team to better manager hypertension and hyperlipidemia   Start Date: 04/02/2021  Expected End Date: 10/30/2021  Recent Progress: Not on track  Priority: Medium  Note:   Current Barriers:  Knowledge Deficits related to plan of care for management of HTN and HLD  patient reporting normally checking blood pressures a few times a week with normal ranges of 140/70's.  Continues to check pressures a few times a week.  States it has been running normal, unsure of exact numbers and machine is on charger right now.  Does endorse ED visit on 12/31 related to black emesis with history of ulcers.  States there has been stomach bug infecting the entire home.  Reports she is able to stay hydrated, has no appetite, tolerating and keeping down soup.  Is taking Zofran  1/26--Patient stating she feels much better. N/V/D is resolved and patient able to keep food, liquids and meds down.  Feels  back to normal self.  Does report blood pressure was elevated, especially when not  ado keep medications down; states it is better but does not remember exact number.  3/9--states she is doing fair; recovered from [place] but now sinuses/allergies are acting up.  Taking here allergy medicine and trying to gain her stregnth back. Concerned about elevated bps recently.  bp this morning was 165/82 with recent ranges of 150-160/60-70's.   4/18--Reports she is doing better, just has her normal allergies.  BP 128/72, with recent ranges of 120-130/60-70's.  Denies any chest pain, swelling, SOB, questions or concerns.  Does report trying beet root to help with her lipids.  5/24--REPORTS RECENT periods of hypertension, medications adjusted and today's BP 127/76.  Also had right rib cage pain, now resolved to just tenderness.  Denise chest pain, SOB, swelling or dizziness.  RNCM Clinical Goal(s):  Patient will verbalize understanding of plan for management of HTN and HLD continue to work with RN Care Manager to address care management and care coordination needs related to HTN and HLD  through collaboration with RN Care manager, provider, and care team.   Interventions: 1:1 collaboration with primary care provider regarding development and update of comprehensive plan of care as evidenced by provider attestation and co-signature Inter-disciplinary care team collaboration (see longitudinal plan of care) Evaluation of current treatment plan related to  self management and patient's adherence to plan as established by provider   Hyperlipidemia:  (Status: Goal on Track (progressing): YES. Condition stable. Not addressed this visit.) Lab Results  Component Value Date   CHOL 187 07/02/2021   HDL 57.10 07/02/2021   LDLCALC 109 (H) 07/02/2021   TRIG 105.0 07/02/2021   CHOLHDL 3 07/02/2021      Medication review performed; medication list updated in electronic medical record.  Provider established cholesterol goals reviewed; Counseled on importance of regular laboratory monitoring as prescribed; Provided HLD educational materials; Provided patient with empathy and support  Hypertension: (Status: Goal on track: NO.) Last practice recorded BP readings:  BP Readings from Last 3 Encounters:  11/26/21 (!) 148/84  11/02/21 130/80  10/30/21 124/66  Most recent eGFR/CrCl: No results found for: EGFR  No components found for: CRCL  Evaluation of current treatment plan related to hypertension self management and patient's adherence to plan as established by provider;   Reviewed prescribed diet low salt low fat low cholesterol Reviewed medications with patient and discussed importance of compliance;  Discussed plans with patient for ongoing care management follow up and provided patient with direct contact information for care management team; Advised patient, providing education and rationale, to monitor blood pressure daily and record, calling PCP for findings outside established parameters;  Discussed complications of poorly controlled blood pressure such as heart disease, stroke, circulatory complications, vision complications, kidney impairment, sexual dysfunction;  Reviewed healthy food choices Contact PCP for elevated blood pressures  Patient Goals/Self-Care Activities: Patient will self administer medications as prescribed Patient will attend all scheduled provider appointments Patient will call pharmacy for medication refills Patient will call provider office for new concerns or questions Check blood pressure 3 times per week Write blood pressure results in a log and take to provider for review Low salt low fat low cholesterol diet Increase activity as tolerated Check blood pressure 3 times per week Write blood pressure results in a log and take to provider for review Low  salt low fat low cholesterol diet Increase activity as tolerated Contact PCP for continued elevated blood pressures      Plan:The care management team will reach out to the patient again over the next 60 days.  Hubert Azure RN, MSN RN Care Management Coordinator  [place] [phone] [email or web address]

## 2021-12-30 DIAGNOSIS — I10 Essential (primary) hypertension: Secondary | ICD-10-CM | POA: Diagnosis not present

## 2021-12-30 DIAGNOSIS — E785 Hyperlipidemia, unspecified: Secondary | ICD-10-CM | POA: Diagnosis not present

## 2022-01-06 DIAGNOSIS — H524 Presbyopia: Secondary | ICD-10-CM | POA: Diagnosis not present

## 2022-01-24 ENCOUNTER — Other Ambulatory Visit: Payer: Self-pay | Admitting: Family Medicine

## 2022-01-29 ENCOUNTER — Ambulatory Visit: Payer: Medicare HMO | Admitting: Physician Assistant

## 2022-02-04 ENCOUNTER — Ambulatory Visit (INDEPENDENT_AMBULATORY_CARE_PROVIDER_SITE_OTHER): Payer: Medicare HMO | Admitting: *Deleted

## 2022-02-04 DIAGNOSIS — I10 Essential (primary) hypertension: Secondary | ICD-10-CM

## 2022-02-04 DIAGNOSIS — E782 Mixed hyperlipidemia: Secondary | ICD-10-CM

## 2022-02-04 NOTE — Chronic Care Management (AMB) (Signed)
  Care Management   Follow Up Note   02/04/2022 Name: Tracy Mckenzie MRN: [id number] DOB: 10/30/1955   Referred by: Allwardt, Randa Evens, PA-C Reason for referral : Chronic Care Management (HTN, HLD)   Successful outreach to patient.  States she is doing well without complaints.  Discussed goals and both agree patient has met goals of the program.  Follow Up Plan: The patient has been provided with contact information for the care management team and has been advised to call with any health-related questions or concerns.  No further follow up required: as personal goals have been met.  Hubert Azure RN, MSN RN Care Management Coordinator  [place] [phone] [email or web address]

## 2022-02-04 NOTE — Patient Instructions (Signed)
CONGRATULATIONS ON COMPLETING YOUR GOALS.  IT AS BEEN A PLEASURE WORKING WITH AND TALKING TO YOU.  IF  NEEDS [place] IN THE FUTURE PLEASE DO NOT HESITATE TO CONTACT ME  [phone]   Makenli Derstine RN, MSN RN Care Management Coordinator  [facility] [phone] [email or web address]  

## 2022-02-05 ENCOUNTER — Encounter: Payer: Self-pay | Admitting: Physician Assistant

## 2022-02-05 ENCOUNTER — Ambulatory Visit (INDEPENDENT_AMBULATORY_CARE_PROVIDER_SITE_OTHER): Payer: Medicare HMO | Admitting: Physician Assistant

## 2022-02-05 VITALS — BP 140/78 | HR 62 | Temp 97.2°F | Ht 66.0 in | Wt 175.0 lb

## 2022-02-05 DIAGNOSIS — E782 Mixed hyperlipidemia: Secondary | ICD-10-CM

## 2022-02-05 DIAGNOSIS — Z23 Encounter for immunization: Secondary | ICD-10-CM

## 2022-02-05 DIAGNOSIS — J302 Other seasonal allergic rhinitis: Secondary | ICD-10-CM | POA: Diagnosis not present

## 2022-02-05 DIAGNOSIS — D72829 Elevated white blood cell count, unspecified: Secondary | ICD-10-CM

## 2022-02-05 DIAGNOSIS — I10 Essential (primary) hypertension: Secondary | ICD-10-CM | POA: Diagnosis not present

## 2022-02-05 LAB — LIPID PANEL
Cholesterol: 213 mg/dL — ABNORMAL HIGH (ref 0–200)
HDL: 55.1 mg/dL (ref >39.00)
LDL Cholesterol: 141 mg/dL — ABNORMAL HIGH (ref 0–99)
NonHDL: 158.05
Total CHOL/HDL Ratio: 4
Triglycerides: 83 mg/dL (ref 0.0–149.0)
VLDL: 16.6 mg/dL (ref 0.0–40.0)

## 2022-02-05 LAB — COMPREHENSIVE METABOLIC PANEL
ALT: 15 U/L (ref 0–35)
AST: 16 U/L (ref 0–37)
Albumin: 4.4 g/dL (ref 3.5–5.2)
Alkaline Phosphatase: 138 U/L — ABNORMAL HIGH (ref 39–117)
BUN: 19 mg/dL (ref 6–23)
CO2: 30 mEq/L (ref 19–32)
Calcium: 10.3 mg/dL (ref 8.4–10.5)
Chloride: 104 mEq/L (ref 96–112)
Creatinine, Ser: 1 mg/dL (ref 0.40–1.20)
GFR: 58.06 mL/min — ABNORMAL LOW (ref >60.00)
Glucose, Bld: 93 mg/dL (ref 70–99)
Potassium: 4.4 mEq/L (ref 3.5–5.1)
Sodium: 143 mEq/L (ref 135–145)
Total Bilirubin: 0.6 mg/dL (ref 0.2–1.2)
Total Protein: 7.1 g/dL (ref 6.0–8.3)

## 2022-02-05 LAB — CBC WITH DIFFERENTIAL/PLATELET
Basophils Absolute: 0.1 10*3/uL (ref 0.0–0.1)
Basophils Relative: 0.6 % (ref 0.0–3.0)
Eosinophils Absolute: 0.2 10*3/uL (ref 0.0–0.7)
Eosinophils Relative: 1.9 % (ref 0.0–5.0)
HCT: 40 % (ref 36.0–46.0)
Hemoglobin: 13.2 g/dL (ref 12.0–15.0)
Lymphocytes Relative: 20.5 % (ref 12.0–46.0)
Lymphs Abs: 2.6 10*3/uL (ref 0.7–4.0)
MCHC: 32.9 g/dL (ref 30.0–36.0)
MCV: 91 fl (ref 78.0–100.0)
Monocytes Absolute: 0.8 10*3/uL (ref 0.1–1.0)
Monocytes Relative: 6.7 % (ref 3.0–12.0)
Neutro Abs: 8.8 10*3/uL — ABNORMAL HIGH (ref 1.4–7.7)
Neutrophils Relative %: 70.3 % (ref 43.0–77.0)
Platelets: 319 10*3/uL (ref 150.0–400.0)
RBC: 4.4 Mil/uL (ref 3.87–5.11)
RDW: 13 % (ref 11.5–15.5)
WBC: 12.5 10*3/uL — ABNORMAL HIGH (ref 4.0–10.5)

## 2022-02-05 NOTE — Patient Instructions (Addendum)
Good to see you today! Fasting labs today. Prevnar 20 injection.  Keep up good work with your health goals! BP looking great!  Call for refills.  Please contact your GI to schedule your screening colonoscopy.  Please call the [facility] to schedule your mammogram. [phone]

## 2022-02-05 NOTE — Progress Notes (Signed)
Subjective:    Patient ID: Tracy Mckenzie, female    DOB: [birth year], 66 y.o.   MRN: [id number]  Chief Complaint  Patient presents with   Follow-up    Pt coming in for f/u and no concerns to discuss; pt is having a hard time with allergies and being stuffy. Pt needing both mammogram and Colonoscopy scheduled for health maintenance     HPI Patient is in today for regular follow up. Fasting this morning and took her medications today. See A/P for discussion.  Past Medical History:  Diagnosis Date   Arthritis    Bleeding ulcer    Elevated cholesterol    Hiatal hernia    High blood pressure    Kidney cysts    Liver cyst    Nerve pain    L sciatic pain radiating to R   Peptic ulcer disease     Past Surgical History:  Procedure Laterality Date   APPENDECTOMY  1992   BREAST CYST ASPIRATION Left 2003   KNEE ARTHROSCOPY Right 04/12/2001   torn cartilage   LAPAROSCOPY  1981   for endometriosis   LEG SURGERY Right 11/23/2000   to remove screws   LEG SURGERY Right 02/08/2002   to remove rod   MENISCUS REPAIR  01/29/2003   TIBIA FRACTURE SURGERY Right 06/12/2000   Multiple surgeries   TONSILLECTOMY  1967   TOTAL ABDOMINAL HYSTERECTOMY  1992    Family History  Problem Relation Age of Onset   Kidney disease Mother    COPD Mother    Breast cancer Maternal Aunt    Breast cancer Paternal Aunt    COPD Father    Diabetes Maternal Grandfather    Stroke Paternal Grandfather    Colon cancer Neg Hx     Social History   Tobacco Use   Smoking status: Never   Smokeless tobacco: Never  Vaping Use   Vaping Use: Never used  Substance Use Topics   Alcohol use: No   Drug use: No     Allergies  Allergen Reactions   Meperidine Palpitations and Anaphylaxis    flushed   Ibuprofen Other (See Comments)    Stomach ulcers.   Simvastatin     Other reaction(s): Muscle Pain   Oxycodone-Acetaminophen Itching and Rash   Prolia [Denosumab] Rash   Vicodin [Hydrocodone-Acetaminophen]  Itching    Review of Systems NEGATIVE UNLESS OTHERWISE INDICATED IN HPI      Objective:     BP 140/78 (BP Location: Right Arm)   Pulse 62   Temp (!) 97.2 F (36.2 C) (Temporal)   Ht '5\' 6"'$  (1.676 m)   Wt 175 lb (79.4 kg)   SpO2 95%   BMI 28.25 kg/m   Wt Readings from Last 3 Encounters:  02/05/22 175 lb (79.4 kg)  11/26/21 177 lb (80.3 kg)  11/02/21 176 lb 6.4 oz (80 kg)    BP Readings from Last 3 Encounters:  02/05/22 140/78  11/26/21 (!) 148/84  11/02/21 130/80     Physical Exam Constitutional:      Appearance: Normal appearance.  Eyes:     Conjunctiva/sclera: Conjunctivae normal.     Pupils: Pupils are equal, round, and reactive to light.  Cardiovascular:     Rate and Rhythm: Normal rate and regular rhythm.     Pulses: Normal pulses.     Heart sounds: Normal heart sounds.  Pulmonary:     Effort: Pulmonary effort is normal.     Breath sounds: Normal breath sounds.  Neurological:     Mental Status: She is alert.  Psychiatric:        Mood and Affect: Mood normal.        Assessment & Plan:   Problem List Items Addressed This Visit       Cardiovascular and Mediastinum   Essential hypertension - Primary   Relevant Orders   CBC with Differential/Platelet (Completed)   Comprehensive metabolic panel (Completed)   Lipid panel (Completed)     Other   Hyperlipidemia   Relevant Orders   Lipid panel (Completed)   Other Visit Diagnoses     Leukocytosis, unspecified type       Relevant Orders   CBC with Differential/Platelet (Completed)   Seasonal allergic rhinitis, unspecified trigger       Need for prophylactic vaccination against Streptococcus pneumoniae (pneumococcus)       Relevant Orders   Pneumococcal conjugate vaccine 20-valent (Prevnar 20) (Completed)      1. Essential hypertension Stable BP Losartan 50 mg BID HCTZ 12.5 mg qd Lopressor 25 mg BID  2. Mixed hyperlipidemia Statin intolerant Update labs today  3. Leukocytosis,  unspecified type Recheck CBC today. Has followed with Dr. Lorenso Courier. Unspecified etiology.  4. Seasonal allergic rhinitis, unspecified trigger Flaring up right now Singulair 10 mg qd Xyzal 5 mg BID Astelin nasal spray Nasal saline after outdoor activity  5. Need for prophylactic vaccination against Streptococcus pneumoniae (pneumococcus) Updated vaccine - Prevnar 20 today   She will contact GI for screening colonoscopy and [facility] for [facility]ogram updates as well.    Return in about 6 months (around 08/08/2022) for recheck meds / fasting labs .    Leisha Trinkle M Brandey Vandalen, PA-C

## 2022-02-15 ENCOUNTER — Other Ambulatory Visit: Payer: Self-pay | Admitting: Physician Assistant

## 2022-02-15 NOTE — Telephone Encounter (Signed)
Please advise if you would like this prescription renewed and refilled for patient.

## 2022-03-01 DIAGNOSIS — I10 Essential (primary) hypertension: Secondary | ICD-10-CM

## 2022-03-01 DIAGNOSIS — E782 Mixed hyperlipidemia: Secondary | ICD-10-CM | POA: Diagnosis not present

## 2022-04-09 ENCOUNTER — Other Ambulatory Visit: Payer: Self-pay | Admitting: Physician Assistant

## 2022-04-12 NOTE — Telephone Encounter (Signed)
Patient need an appt first?

## 2022-04-18 ENCOUNTER — Other Ambulatory Visit: Payer: Self-pay

## 2022-04-18 ENCOUNTER — Emergency Department (HOSPITAL_BASED_OUTPATIENT_CLINIC_OR_DEPARTMENT_OTHER)
Admission: EM | Admit: 2022-04-18 | Discharge: 2022-04-18 | Disposition: A | Discharge status: Home / Self Care | Payer: Medicare HMO | Attending: Emergency Medicine | Admitting: Emergency Medicine

## 2022-04-18 ENCOUNTER — Emergency Department (HOSPITAL_BASED_OUTPATIENT_CLINIC_OR_DEPARTMENT_OTHER): Payer: Medicare HMO

## 2022-04-18 ENCOUNTER — Encounter (HOSPITAL_BASED_OUTPATIENT_CLINIC_OR_DEPARTMENT_OTHER): Payer: Self-pay | Admitting: Emergency Medicine

## 2022-04-18 DIAGNOSIS — D72829 Elevated white blood cell count, unspecified: Secondary | ICD-10-CM | POA: Insufficient documentation

## 2022-04-18 DIAGNOSIS — M545 Low back pain, unspecified: Secondary | ICD-10-CM | POA: Insufficient documentation

## 2022-04-18 DIAGNOSIS — R109 Unspecified abdominal pain: Secondary | ICD-10-CM | POA: Insufficient documentation

## 2022-04-18 DIAGNOSIS — M5459 Other low back pain: Secondary | ICD-10-CM | POA: Diagnosis not present

## 2022-04-18 DIAGNOSIS — N281 Cyst of kidney, acquired: Secondary | ICD-10-CM | POA: Diagnosis not present

## 2022-04-18 LAB — URINALYSIS, ROUTINE W REFLEX MICROSCOPIC
Bilirubin Urine: NEGATIVE
Glucose, UA: NEGATIVE mg/dL
Hgb urine dipstick: NEGATIVE
Ketones, ur: NEGATIVE mg/dL
Nitrite: NEGATIVE
Specific Gravity, Urine: 1.033 — ABNORMAL HIGH (ref 1.005–1.030)
pH: 6 (ref 5.0–8.0)

## 2022-04-18 LAB — CBC WITH DIFFERENTIAL/PLATELET
Abs Immature Granulocytes: 0.12 10*3/uL — ABNORMAL HIGH (ref 0.00–0.07)
Basophils Absolute: 0.1 10*3/uL (ref 0.0–0.1)
Basophils Relative: 0 %
Eosinophils Absolute: 0.1 10*3/uL (ref 0.0–0.5)
Eosinophils Relative: 1 %
HCT: 41 % (ref 36.0–46.0)
Hemoglobin: 13.7 g/dL (ref 12.0–15.0)
Immature Granulocytes: 1 %
Lymphocytes Relative: 9 %
Lymphs Abs: 1.9 10*3/uL (ref 0.7–4.0)
MCH: 29.8 pg (ref 26.0–34.0)
MCHC: 33.4 g/dL (ref 30.0–36.0)
MCV: 89.1 fL (ref 80.0–100.0)
Monocytes Absolute: 1 10*3/uL (ref 0.1–1.0)
Monocytes Relative: 5 %
Neutro Abs: 16.9 10*3/uL — ABNORMAL HIGH (ref 1.7–7.7)
Neutrophils Relative %: 84 %
Platelets: 355 10*3/uL (ref 150–400)
RBC: 4.6 MIL/uL (ref 3.87–5.11)
RDW: 12.7 % (ref 11.5–15.5)
WBC: 20.1 10*3/uL — ABNORMAL HIGH (ref 4.0–10.5)
nRBC: 0 % (ref 0.0–0.2)

## 2022-04-18 LAB — BASIC METABOLIC PANEL
Anion gap: 12 (ref 5–15)
BUN: 14 mg/dL (ref 8–23)
CO2: 26 mmol/L (ref 22–32)
Calcium: 9.8 mg/dL (ref 8.9–10.3)
Chloride: 104 mmol/L (ref 98–111)
Creatinine, Ser: 1.04 mg/dL — ABNORMAL HIGH (ref 0.44–1.00)
GFR, Estimated: 59 mL/min — ABNORMAL LOW (ref >60)
Glucose, Bld: 119 mg/dL — ABNORMAL HIGH (ref 70–99)
Potassium: 4.2 mmol/L (ref 3.5–5.1)
Sodium: 142 mmol/L (ref 135–145)

## 2022-04-18 MED ORDER — LACTATED RINGERS IV BOLUS
1000.0000 mL | Freq: Once | INTRAVENOUS | Status: AC
  Administered 2022-04-18: 1000 mL via INTRAVENOUS

## 2022-04-18 MED ORDER — METHOCARBAMOL 500 MG PO TABS: 500.0000 mg | ORAL_TABLET | Freq: Three times a day (TID) | ORAL | 0 refills | Status: DC | PRN

## 2022-04-18 MED ORDER — FENTANYL CITRATE PF 50 MCG/ML IJ SOSY
50.0000 ug | PREFILLED_SYRINGE | Freq: Once | INTRAMUSCULAR | Status: AC
  Administered 2022-04-18: 50 ug via INTRAVENOUS
  Filled 2022-04-18: qty 1

## 2022-04-18 MED ORDER — METHOCARBAMOL 500 MG PO TABS
750.0000 mg | ORAL_TABLET | Freq: Once | ORAL | Status: AC
  Administered 2022-04-18: 750 mg via ORAL
  Filled 2022-04-18: qty 2

## 2022-04-18 MED ORDER — KETOROLAC TROMETHAMINE 15 MG/ML IJ SOLN
15.0000 mg | Freq: Once | INTRAMUSCULAR | Status: AC
  Administered 2022-04-18: 15 mg via INTRAVENOUS
  Filled 2022-04-18: qty 1

## 2022-04-18 MED ORDER — DEXAMETHASONE SODIUM PHOSPHATE 10 MG/ML IJ SOLN
10.0000 mg | Freq: Once | INTRAMUSCULAR | Status: AC
  Administered 2022-04-18: 10 mg via INTRAVENOUS
  Filled 2022-04-18: qty 1

## 2022-04-18 MED ORDER — HYDROXYZINE HCL 25 MG PO TABS
25.0000 mg | ORAL_TABLET | Freq: Once | ORAL | Status: AC
  Administered 2022-04-18: 25 mg via ORAL
  Filled 2022-04-18: qty 1

## 2022-04-18 MED ORDER — IOHEXOL 300 MG/ML  SOLN
100.0000 mL | Freq: Once | INTRAMUSCULAR | Status: AC | PRN
  Administered 2022-04-18: 100 mL via INTRAVENOUS

## 2022-04-18 MED ORDER — LIDOCAINE 5 % EX PTCH: 1.0000 | MEDICATED_PATCH | CUTANEOUS | 0 refills | Status: DC

## 2022-04-18 NOTE — Discharge Instructions (Addendum)
Take tylenol 1000 mg (2 extra strength tablets) 3 times a day with meals.  Use Robaxin as needed for muscle stiffness or soreness. Have caution, as this may make you tired or groggy. Do not drive or operate heavy machinery while taking this medication.  Use muscle creams (bengay, icy hot, salonpas) as needed for pain.  Use the pain patch as needed on your back.  Do the back stretches in the paperwork. Follow up with your primary care doctor for re-evaluation of your high high blood count.  Follow up with the neurosurgery group listed below for further evaluation and management of your back.  Return to the ER if you develop high fevers, numbness, loss of bowel or bladder control, or any new or concerning symptoms.

## 2022-04-18 NOTE — ED Notes (Signed)
Pa at bedside to discuss CT results prior to patient leaving.

## 2022-04-18 NOTE — ED Notes (Signed)
Placed on 2 L [place] for low O2 sats

## 2022-04-18 NOTE — ED Triage Notes (Signed)
Pt very sleepy in triage due to muscle relaxer per family.

## 2022-04-18 NOTE — ED Provider Notes (Signed)
[facility] Provider Note   CSN: [id number] Arrival date & time: 04/18/22  1620     History  Chief Complaint  Patient presents with   Back Pain    Tracy Mckenzie is a 66 y.o. female presenting for evaluation of r sided back pain.   Pt states she developed R sided back pain 2 days ago. She has a h/o chronic L sided back pain and R leg pain. She states she was sitting at her desk on Friday when she went to stand up and had acute pain of her R low back. Pain does not radiate. No new numbness or tingling. No loss of bowel of bladder control. No fevers. No fall/trauma/injury.  Pt and family states she had had poor po intake due to pain.   HPI     Home Medications Prior to Admission medications   Medication Sig Start Date End Date Taking? Authorizing Provider  lidocaine (LIDODERM) 5 % Place 1 patch onto the skin daily. Remove & Discard patch within 12 hours or as directed by MD 04/18/22  Yes Lauralee Waters, PA-C  methocarbamol (ROBAXIN) 500 MG tablet Take 1 tablet (500 mg total) by mouth 3 (three) times daily as needed for muscle spasms. 04/18/22  Yes Vyla Pint, PA-C  albuterol (VENTOLIN HFA) 108 (90 Base) MCG/ACT inhaler Inhale 2 puffs into the lungs every 6 (six) hours as needed for wheezing or shortness of breath. 11/27/20   Orma Flaming, MD  azelastine (ASTELIN) 0.1 % nasal spray Place 1 spray into both nostrils 2 (two) times daily as needed for rhinitis or allergies. 11/27/20   Orma Flaming, MD  baclofen (LIORESAL) 10 MG tablet TAKE ONE TABLET BY MOUTH THREE TIMES A DAY 08/07/21   Allwardt, Alyssa M, PA-C  benzonatate (TESSALON) 100 MG capsule TAKE 1 OR 2 CAPSULES BY MOUTH TWO TIMES A DAY AS NEEDED FOR COUGH 04/12/22   Allwardt, Alyssa M, PA-C  chlorpheniramine-HYDROcodone (TUSSIONEX PENNKINETIC ER) 10-8 MG/5ML SUER Take 5 mLs by mouth every 12 (twelve) hours as needed for cough. 05/10/21   Isla Pence, MD  Coenzyme Q10 (CO Q-10) 100 MG CAPS  Take 100 mg by mouth daily.    [provider]  fluticasone (CUTIVATE) 0.05 % cream Apply 1 application topically as needed (rash). 06/12/20   [provider]  gabapentin (NEURONTIN) 300 MG capsule TAKE ONE CAPSULE BY MOUTH TWICE A DAY 09/21/21   Allwardt, Alyssa M, PA-C  hydrochlorothiazide (MICROZIDE) 12.5 MG capsule TAKE ONE CAPSULE BY MOUTH DAILY 01/25/22   Allwardt, Alyssa M, PA-C  hydrOXYzine (ATARAX) 10 MG tablet Take 1 tablet (10 mg total) by mouth every 6 (six) hours. 11/26/21   Jeanie Sewer, NP  levocetirizine (XYZAL) 5 MG tablet Take 5 mg by mouth 2 (two) times daily.    [provider]  losartan (COZAAR) 50 MG tablet TAKE ONE TABLET BY MOUTH TWICE A DAY FOR FOR BLOOD PRESSURE 10/30/21   Allwardt, Alyssa M, PA-C  magnesium 30 MG tablet Take 90 mg by mouth daily.    [provider]  metoprolol tartrate (LOPRESSOR) 25 MG tablet Take 1 tablet (25 mg total) by mouth 2 (two) times daily. 10/30/21 01/28/22  Allwardt, Randa Evens, PA-C  metroNIDAZOLE (METROCREAM) 0.75 % cream Apply 1 application topically 2 (two) times daily. 06/12/20   [provider]  montelukast (SINGULAIR) 10 MG tablet Take 1 tablet (10 mg total) by mouth at bedtime. 10/30/21   Allwardt, Randa Evens, PA-C  nitroGLYCERIN (NITROSTAT) 0.4 MG SL  tablet Place 1 tablet (0.4 mg total) under the tongue every 5 (five) minutes as needed for chest pain. 11/27/20   Orma Flaming, MD  omeprazole (PRILOSEC) 40 MG capsule TAKE ONE CAPSULE BY MOUTH DAILY 30 MINUTES BEFORE BREAKFAST 07/03/21   Allwardt, Randa Evens, PA-C  ondansetron (ZOFRAN-ODT) 4 MG disintegrating tablet '4mg'$  ODT q4 hours prn nausea/vomit 08/01/21   Deno Etienne, DO  QUERCETIN PO Take 1 tablet by mouth daily.    [provider]  Spacer/Aero-Holding Chambers (PRO COMFORT SPACER ADULT) MISC 1 Device by Does not apply route as needed. 03/07/20   Orma Flaming, MD  tacrolimus (PROTOPIC) 0.1 % ointment Apply 1 application topically as  needed (rash). 08/04/20   [provider]  traMADol-acetaminophen (ULTRACET) 37.5-325 MG tablet Take 1 tablet by mouth every 8 (eight) hours as needed. 11/26/21   Jeanie Sewer, NP  zinc gluconate 50 MG tablet Take 50 mg by mouth daily.    [provider]      Allergies    Meperidine, Ibuprofen, Simvastatin, Oxycodone-acetaminophen, Prolia [denosumab], and Vicodin [hydrocodone-acetaminophen]    Review of Systems   Review of Systems  Musculoskeletal:  Positive for back pain.  All other systems reviewed and are negative.   Physical Exam Updated Vital Signs BP (!) 154/68   Pulse 73   Temp 97.7 F (36.5 C) (Oral)   Resp 13   SpO2 96%  Physical Exam Vitals and nursing note reviewed.  Constitutional:      General: She is not in acute distress.    Appearance: Normal appearance.     Comments: Pears uncomfortable due to pain. Otherwise nontoxic  HENT:     Head: Normocephalic and atraumatic.  Eyes:     Conjunctiva/sclera: Conjunctivae normal.     Pupils: Pupils are equal, round, and reactive to light.  Cardiovascular:     Rate and Rhythm: Normal rate and regular rhythm.     Pulses: Normal pulses.  Pulmonary:     Effort: Pulmonary effort is normal. No respiratory distress.     Breath sounds: Normal breath sounds. No wheezing.     Comments: Speaking in full sentences.  Clear lung sounds in all fields. Abdominal:     General: There is no distension.     Palpations: Abdomen is soft. There is no mass.     Tenderness: There is no abdominal tenderness. There is no guarding or rebound.  Musculoskeletal:        General: Tenderness present. Normal range of motion.     Cervical back: Normal range of motion and neck supple.     Comments: TTP of R low back musculature. Chronic and unchanged ttp of midline spine and L low back. Strength intact in lower ext bilaterally. Pedal pulses 2+. No numbness. No saddle anesthesia  Skin:    General: Skin is warm and dry.      Capillary Refill: Capillary refill takes less than 2 seconds.  Neurological:     Mental Status: She is alert and oriented to person, place, and time.  Psychiatric:        Mood and Affect: Mood and affect normal.        Speech: Speech normal.        Behavior: Behavior normal.     ED Results / Procedures / Treatments   Labs (all labs ordered are listed, but only abnormal results are displayed) Labs Reviewed  BASIC METABOLIC PANEL - Abnormal; Notable for the following components:      Result Value  Glucose, Bld 119 (*)    Creatinine, Ser 1.04 (*)    GFR, Estimated 59 (*)    All other components within normal limits  CBC WITH DIFFERENTIAL/PLATELET - Abnormal; Notable for the following components:   WBC 20.1 (*)    Neutro Abs 16.9 (*)    Abs Immature Granulocytes 0.12 (*)    All other components within normal limits  URINALYSIS, ROUTINE W REFLEX MICROSCOPIC - Abnormal; Notable for the following components:   Specific Gravity, Urine 1.033 (*)    Protein, ur TRACE (*)    Leukocytes,Ua TRACE (*)    All other components within normal limits    EKG None  Radiology CT ABDOMEN PELVIS W CONTRAST  Result Date: 04/18/2022 CLINICAL DATA:  Lower back pain for 3 days. EXAM: CT ABDOMEN AND PELVIS WITH CONTRAST TECHNIQUE: Multidetector CT imaging of the abdomen and pelvis was performed using the standard protocol following bolus administration of intravenous contrast. RADIATION DOSE REDUCTION: This exam was performed according to the departmental dose-optimization program which includes automated exposure control, adjustment of the mA and/or kV according to patient size and/or use of iterative reconstruction technique. CONTRAST:  179m OMNIPAQUE IOHEXOL 300 MG/ML  SOLN COMPARISON:  Lumbar spine radiographs dated 09/05/2019 and MR lumbar spine dated 10/11/2019. FINDINGS: Lower chest: There is mild bibasilar atelectasis. Hepatobiliary: A 6 mm cyst is seen in the left hepatic lobe. No gallstones,  gallbladder wall thickening, or biliary dilatation is identified. Pancreas: Unremarkable. No pancreatic ductal dilatation or surrounding inflammatory changes. Spleen: Normal in size without focal abnormality. Adrenals/Urinary Tract: Adrenal glands are unremarkable. A benign cyst in the right kidney measures 1.7 cm. No further imaging follow-up is recommended for this finding. Otherwise, the kidneys are normal, without renal calculi, solid focal lesion, or hydronephrosis. Bladder is incompletely distended. Stomach/Bowel: There is a small hiatal hernia. Appendix is absent. No evidence of bowel wall thickening, distention, or inflammatory changes. Vascular/Lymphatic: Aortic atherosclerosis. No enlarged abdominal or pelvic lymph nodes. Reproductive: Status post hysterectomy. No adnexal masses. Other: No abdominal wall hernia or abnormality. No abdominopelvic ascites. Musculoskeletal: There is a chronic compression fracture of L1 with approximately 50% height loss centrally and 3 mm retropulsion into the central canal. Up to severe degenerative changes are seen in the spine, most significant at L2-3 and L5-S1 where there is moderate neuroforaminal stenosis. IMPRESSION: 1.  No acute process in the abdomen or pelvis. 2. Chronic appearing compression fracture of L1 with 3 mm retropulsion into the central canal. Severe degenerative changes in the lumbar spine. Aortic Atherosclerosis (ICD10-I70.0). Electronically Signed   By: TZerita BoersM.D.   On: 04/18/2022 20:46    Procedures Procedures    Medications Ordered in ED Medications  fentaNYL (SUBLIMAZE) injection 50 mcg (50 mcg Intravenous Given 04/18/22 1810)  hydrOXYzine (ATARAX) tablet 25 mg (25 mg Oral Given 04/18/22 1808)  lactated ringers bolus 1,000 mL (0 mLs Intravenous Stopped 04/18/22 2050)  ketorolac (TORADOL) 15 MG/ML injection 15 mg (15 mg Intravenous Given 04/18/22 1809)  dexamethasone (DECADRON) injection 10 mg (10 mg Intravenous Given 04/18/22 1809)   methocarbamol (ROBAXIN) tablet 750 mg (750 mg Oral Given 04/18/22 1808)  iohexol (OMNIPAQUE) 300 MG/ML solution 100 mL (100 mLs Intravenous Contrast Given 04/18/22 2026)    ED Course/ Medical Decision Making/ A&P Clinical Course as of 04/18/22 2220  Sun Apr 18, 2022  2006 Patient symptoms are improved.  She does have a leukocytosis, and says that this is chronic for her, and that she has been worked up  for "idiopathic leukocytosis".  However white blood cell count is much higher than normal.  We are waiting on a UA sample. [MT]  2119 Ua unremarkable; CT showing chronic L-spine compression fracture which may be the cause of her ongoing mid back pain that she has had for "a long time".  She will follow-up with neurosurgery for this.  At this point I have a low suspicion for cauda equina or cord compression. [MT]    Clinical Course User Index [MT] Trifan, Carola Rhine, MD                           Medical Decision Making Amount and/or Complexity of Data Reviewed Labs: ordered. Radiology: ordered.  Risk Prescription drug management.    This patient presents to the ED for concern of R low back pain, this involves an extensive number of treatment options, and is a complaint that carries with it a moderate risk of complications and morbidity.  The differential diagnosis includes msk pain, vertebral injury, nerve injury, myelopathy, uti, pyelo.   Co morbidities:  Chronic pain   Additional history: Reviewed previous labs including chronically elevated leukocytosis.    Lab Tests:  I ordered, and personally interpreted labs.  The pertinent results include:  no evidence of urine infection. SCr similar to baseline. WBC significantly elevated, acute on chronic elevation. As there is no fever or infectious source, consider pain as cause.    Imaging Studies:  I ordered imaging studies including CTAP I independently visualized and interpreted imaging which showed chronic L1 fracture with  retropulsion I agree with the radiologist interpretation    Medicines ordered:  I ordered medication including robaxin, hydroxazine, decadron, fentanyl, LR bolus for pain control and concern for dehydration Reevaluation of the patient after these medicines showed that the patient improved I have reviewed the patients home medicines and have made adjustments as needed   Test Considered:  no need for emergent MRI as pt without acute neurologic deficits. No red flags for back pain  Disposition:  After consideration of the diagnostic results and the patients response to treatment, I feel that the patent would benefit from symptomatic management with neurosurgery outpatient follow up. Discussed findings and plan with pt and husband. At this time, pt appears safe for d/c. Return precautions given. Pt states she understands and agrees to plan.           Final Clinical Impression(s) / ED Diagnoses Final diagnoses:  Acute right-sided low back pain without sciatica  Leukocytosis, unspecified type    Rx / DC Orders ED Discharge Orders          Ordered    methocarbamol (ROBAXIN) 500 MG tablet  3 times daily PRN        04/18/22 2102    lidocaine (LIDODERM) 5 %  Every 24 hours        04/18/22 2102              Franchot Heidelberg, PA-C 04/18/22 2220    Wyvonnia Dusky, MD 04/19/22 [phone]

## 2022-04-18 NOTE — ED Triage Notes (Signed)
Pt started having lower right back pain on Friday and has worsened. Pt had been taking robaxin and has not helped, no eating or drinking or taking meds today or yesterday due to pain.

## 2022-04-19 ENCOUNTER — Telehealth: Payer: Self-pay | Admitting: Physician Assistant

## 2022-04-19 ENCOUNTER — Encounter: Payer: Self-pay | Admitting: Physician Assistant

## 2022-04-19 ENCOUNTER — Ambulatory Visit (INDEPENDENT_AMBULATORY_CARE_PROVIDER_SITE_OTHER)
Admission: RE | Admit: 2022-04-19 | Discharge: 2022-04-19 | Disposition: A | Discharge status: Home / Self Care | Payer: Medicare HMO | Source: Ambulatory Visit | Attending: Physician Assistant | Admitting: Physician Assistant

## 2022-04-19 ENCOUNTER — Ambulatory Visit (INDEPENDENT_AMBULATORY_CARE_PROVIDER_SITE_OTHER): Payer: Medicare HMO | Admitting: Physician Assistant

## 2022-04-19 ENCOUNTER — Telehealth: Payer: Self-pay

## 2022-04-19 ENCOUNTER — Other Ambulatory Visit (INDEPENDENT_AMBULATORY_CARE_PROVIDER_SITE_OTHER): Payer: Medicare HMO

## 2022-04-19 ENCOUNTER — Encounter (HOSPITAL_BASED_OUTPATIENT_CLINIC_OR_DEPARTMENT_OTHER): Payer: Self-pay

## 2022-04-19 ENCOUNTER — Emergency Department (HOSPITAL_BASED_OUTPATIENT_CLINIC_OR_DEPARTMENT_OTHER)
Admission: EM | Admit: 2022-04-19 | Discharge: 2022-04-19 | Disposition: A | Discharge status: Home / Self Care | Payer: Medicare HMO | Attending: Emergency Medicine | Admitting: Emergency Medicine

## 2022-04-19 VITALS — BP 154/86 | HR 78 | Temp 98.0°F | Ht 66.0 in | Wt 176.2 lb

## 2022-04-19 DIAGNOSIS — S32010A Wedge compression fracture of first lumbar vertebra, initial encounter for closed fracture: Secondary | ICD-10-CM

## 2022-04-19 DIAGNOSIS — D72829 Elevated white blood cell count, unspecified: Secondary | ICD-10-CM

## 2022-04-19 DIAGNOSIS — R799 Abnormal finding of blood chemistry, unspecified: Secondary | ICD-10-CM | POA: Diagnosis not present

## 2022-04-19 DIAGNOSIS — G8929 Other chronic pain: Secondary | ICD-10-CM | POA: Insufficient documentation

## 2022-04-19 DIAGNOSIS — J328 Other chronic sinusitis: Secondary | ICD-10-CM

## 2022-04-19 DIAGNOSIS — M549 Dorsalgia, unspecified: Secondary | ICD-10-CM | POA: Diagnosis not present

## 2022-04-19 DIAGNOSIS — R053 Chronic cough: Secondary | ICD-10-CM

## 2022-04-19 DIAGNOSIS — M545 Low back pain, unspecified: Secondary | ICD-10-CM

## 2022-04-19 LAB — CBC WITH DIFFERENTIAL/PLATELET
Abs Immature Granulocytes: 0.18 10*3/uL — ABNORMAL HIGH (ref 0.00–0.07)
Basophils Absolute: 0.1 10*3/uL (ref 0.0–0.1)
Basophils Absolute: 0 10*3/uL (ref 0.0–0.1)
Basophils Relative: 0.2 % (ref 0.0–3.0)
Basophils Relative: 0 %
Eosinophils Absolute: 0 10*3/uL (ref 0.0–0.7)
Eosinophils Absolute: 0.1 10*3/uL (ref 0.0–0.5)
Eosinophils Relative: 0 % (ref 0.0–5.0)
Eosinophils Relative: 0 %
HCT: 38.8 % (ref 36.0–46.0)
HCT: 38.9 % (ref 36.0–46.0)
Hemoglobin: 13 g/dL (ref 12.0–15.0)
Hemoglobin: 13.3 g/dL (ref 12.0–15.0)
Immature Granulocytes: 1 %
Lymphocytes Relative: 6.6 % — ABNORMAL LOW (ref 12.0–46.0)
Lymphocytes Relative: 6 %
Lymphs Abs: 1.4 10*3/uL (ref 0.7–4.0)
Lymphs Abs: 1.5 10*3/uL (ref 0.7–4.0)
MCH: 30 pg (ref 26.0–34.0)
MCHC: 33.5 g/dL (ref 30.0–36.0)
MCHC: 34.2 g/dL (ref 30.0–36.0)
MCV: 88.2 fl (ref 78.0–100.0)
MCV: 87.8 fL (ref 80.0–100.0)
Monocytes Absolute: 0.7 10*3/uL (ref 0.1–1.0)
Monocytes Absolute: 1.2 10*3/uL — ABNORMAL HIGH (ref 0.1–1.0)
Monocytes Relative: 3.4 % (ref 3.0–12.0)
Monocytes Relative: 5 %
Neutro Abs: 19.1 10*3/uL — ABNORMAL HIGH (ref 1.4–7.7)
Neutro Abs: 20.7 10*3/uL — ABNORMAL HIGH (ref 1.7–7.7)
Neutrophils Relative %: 89.8 % — ABNORMAL HIGH (ref 43.0–77.0)
Neutrophils Relative %: 88 %
Platelets: 373 10*3/uL (ref 150.0–400.0)
Platelets: 373 10*3/uL (ref 150–400)
RBC: 4.39 Mil/uL (ref 3.87–5.11)
RBC: 4.43 MIL/uL (ref 3.87–5.11)
RDW: 13.3 % (ref 11.5–15.5)
RDW: 12.6 % (ref 11.5–15.5)
WBC: 21.3 10*3/uL (ref 4.0–10.5)
WBC: 23.6 10*3/uL — ABNORMAL HIGH (ref 4.0–10.5)
nRBC: 0 % (ref 0.0–0.2)

## 2022-04-19 LAB — COMPREHENSIVE METABOLIC PANEL
ALT: 7 U/L (ref 0–44)
AST: 11 U/L — ABNORMAL LOW (ref 15–41)
Albumin: 4.5 g/dL (ref 3.5–5.0)
Alkaline Phosphatase: 128 U/L — ABNORMAL HIGH (ref 38–126)
Anion gap: 12 (ref 5–15)
BUN: 16 mg/dL (ref 8–23)
CO2: 23 mmol/L (ref 22–32)
Calcium: 10.4 mg/dL — ABNORMAL HIGH (ref 8.9–10.3)
Chloride: 105 mmol/L (ref 98–111)
Creatinine, Ser: 0.87 mg/dL (ref 0.44–1.00)
GFR, Estimated: >60 mL/min (ref >60)
Glucose, Bld: 115 mg/dL — ABNORMAL HIGH (ref 70–99)
Potassium: 4 mmol/L (ref 3.5–5.1)
Sodium: 140 mmol/L (ref 135–145)
Total Bilirubin: 0.3 mg/dL (ref 0.3–1.2)
Total Protein: 7.2 g/dL (ref 6.5–8.1)

## 2022-04-19 LAB — LACTIC ACID, PLASMA
Lactic Acid, Venous: 1.6 mmol/L (ref 0.5–1.9)
Lactic Acid, Venous: 1.3 mmol/L (ref 0.5–1.9)

## 2022-04-19 LAB — URINALYSIS, ROUTINE W REFLEX MICROSCOPIC
Bilirubin Urine: NEGATIVE
Glucose, UA: NEGATIVE mg/dL
Hgb urine dipstick: NEGATIVE
Ketones, ur: NEGATIVE mg/dL
Leukocytes,Ua: NEGATIVE
Nitrite: NEGATIVE
Protein, ur: NEGATIVE mg/dL
Specific Gravity, Urine: 1.027 (ref 1.005–1.030)
pH: 7 (ref 5.0–8.0)

## 2022-04-19 NOTE — Patient Instructions (Addendum)
Please go to [facility] for chest XRAY and repeat CBC.   Referral to neurosurgery  Order placed for CT sinuses  We need to figure out best cocktail of medications to help your symptoms without making you loopy.  Please let me know if Baclofen or Robaxin works better for muscle relaxation. Do not take these medications at the same time. I would caution with hydroxyzine as this can be very sedating -

## 2022-04-19 NOTE — ED Provider Notes (Signed)
[facility] DEPT Provider Note   CSN: [id number] Arrival date & time: 04/19/22  1615     History Chief Complaint  Patient presents with   Abnormal Lab    HPI Tracy Mckenzie is a 66 y.o. female presenting for abnormal lab.  She states that she had labs done yesterday that demonstrated a leukocytosis.  She had follow-up appoint with her PCP today who recommended she come back to the emergency department for blood cultures given the elevated white blood cell count.  She denies fevers or chills nausea vomiting syncope shortness of breath.  She otherwise ambulatory tolerating p.o. intake.  She endorses chronic back pain, chronic cough with no change in her symptoms.  She was given a dose of steroids for her back pain last night.  She is otherwise ambulatory tolerating p.o. intake.   Patient's recorded medical, surgical, social, medication list and allergies were reviewed in the Snapshot window as part of the initial history.   Review of Systems   Review of Systems  Constitutional:  Negative for chills and fever.  HENT:  Negative for ear pain and sore throat.   Eyes:  Negative for pain and visual disturbance.  Respiratory:  Negative for cough and shortness of breath.   Cardiovascular:  Negative for chest pain and palpitations.  Gastrointestinal:  Negative for abdominal pain and vomiting.  Genitourinary:  Negative for dysuria and hematuria.  Musculoskeletal:  Negative for arthralgias and back pain.  Skin:  Negative for color change and rash.  Neurological:  Negative for seizures and syncope.  All other systems reviewed and are negative.   Physical Exam Updated Vital Signs BP (!) 151/72 (BP Location: Right Arm)   Pulse 74   Temp 98.1 F (36.7 C) (Oral)   Resp 15   SpO2 99%  Physical Exam Vitals and nursing note reviewed.  Constitutional:      General: She is not in acute distress.    Appearance: She is well-developed.  HENT:     Head: Normocephalic  and atraumatic.  Eyes:     Conjunctiva/sclera: Conjunctivae normal.  Cardiovascular:     Rate and Rhythm: Normal rate and regular rhythm.     Heart sounds: No murmur heard. Pulmonary:     Effort: Pulmonary effort is normal. No respiratory distress.     Breath sounds: Normal breath sounds.  Abdominal:     Palpations: Abdomen is soft.     Tenderness: There is no abdominal tenderness.  Musculoskeletal:        General: No swelling.     Cervical back: Neck supple.  Skin:    General: Skin is warm and dry.     Capillary Refill: Capillary refill takes less than 2 seconds.  Neurological:     Mental Status: She is alert.  Psychiatric:        Mood and Affect: Mood normal.      ED Course/ Medical Decision Making/ A&P    Procedures Procedures   Medications Ordered in ED Medications - No data to display  Medical Decision Making:    Eshika Reckart is a 66 y.o. female who presented to the ED today with abnormal labs detailed above.     Patient's presentation is complicated by their history of multiple comorbid medical problems including hypertension on chronic outpatient medication regimen.  Patient placed on continuous vitals and telemetry monitoring while in ED which was reviewed periodically.   Complete initial physical exam performed, notably the patient  was hemodynamically stable in no  acute distress.  She is currently asymptomatic.Marland Kitchen      Reviewed and confirmed nursing documentation for past medical history, family history, social history.    Initial Assessment:   On reassessment, patient remains grossly asymptomatic.  I favor that patient's presentation is most consistent with recurrence of her idiopathic leukocytosis likely continuing to rise after being administered steroids last night.  She is planning to call her hematologist in the outpatient setting.  In the meanwhile, no infectious etiology identified.  We will blood cultures and plan for outpatient follow-up with blood  cultures with PCP or calling patient back to emergency department if positive.  No indication for antibiotics given otherwise well appearance at this time. Lab work was repeated in the emergency department and demonstrate stable rise in her leukocytosis without any other acute pathology appreciated.  Do not believe that her presentation represents sepsis given lack of any acute symptoms, extensive recent evaluation with no infectious etiology identified and history of similar leukocytosis in the past. Plan for patient to follow-up with spinal doctor, hematologist, PCP on the outpatient setting.  Patient ambulatory tolerating p.o. intake in no acute distress at this time.  Clinical Impression:  1. Leukocytosis, unspecified type      Discharge   Final Clinical Impression(s) / ED Diagnoses Final diagnoses:  Leukocytosis, unspecified type    Rx / DC Orders ED Discharge Orders     None         Tretha Sciara, MD 04/19/22 2329

## 2022-04-19 NOTE — ED Triage Notes (Signed)
Pt was dx with idiopathetic white count in the past.  Pt states her count went up from 20100 to 21000, so PCP sent her for eval. They are concerned about sepsis.

## 2022-04-19 NOTE — Telephone Encounter (Signed)
Tracy Mckenzie with [facility] called regarding patient. Critical WBC 21.3

## 2022-04-19 NOTE — Progress Notes (Signed)
Subjective:    Patient ID: Tracy Mckenzie, female    DOB: 1956-04-28, 66 y.o.   MRN: [id number]  Chief Complaint  Patient presents with   Follow-up    Pt in for ED f/u; pt not really sleeping well, two new meds and has allergic reactions to pain meds except tylenol. Was knocked out all day yesterday, pt was incoherent and couldn't wake up yesterday so went to ED.  Pt had doubled up on Baclofen, not taken BP meds in several days    HPI Patient is in today for ED f/up from 04/18/22. She was seen for acute R sided low back pain - CT showed chronic L spine compression fracture. No suspicion of cauda equina or cord compression. CBC showed WBC count higher than normal as well.    Took baclofen and hydroxyzine and again 8 hours later, nothing seemed to help.  Saturday morning took another two doses. Said she always takes hydroxyzine and baclofen together. Next morning had trouble waking up, needed help getting dressed, husband had to spoon-feed her yesterday. States she was taking these medications with hardly anything on her stomach.   2 am took Methocarbamol and lidocaine patch. Also took benzonatate perles for sinus drainage cough. Says this is the only thing that helps her. Sinus drainage has been worse the last 1-2 weeks.   Pain today is better. Normal back pain is on her left; now on her right. Still noticing it. Getting around with a cane for extra support. Didn't sleep well last night.   Past Medical History:  Diagnosis Date   Arthritis    Bleeding ulcer    Elevated cholesterol    Hiatal hernia    High blood pressure    Kidney cysts    Liver cyst    Nerve pain    L sciatic pain radiating to R   Peptic ulcer disease     Past Surgical History:  Procedure Laterality Date   APPENDECTOMY  1992   BREAST CYST ASPIRATION Left 2003   KNEE ARTHROSCOPY Right 04/12/2001   torn cartilage   LAPAROSCOPY  1981   for endometriosis   LEG SURGERY Right 11/23/2000   to remove screws   LEG  SURGERY Right 02/08/2002   to remove rod   MENISCUS REPAIR  01/29/2003   TIBIA FRACTURE SURGERY Right 06/12/2000   Multiple surgeries   TONSILLECTOMY  1967   TOTAL ABDOMINAL HYSTERECTOMY  1992    Family History  Problem Relation Age of Onset   Kidney disease Mother    COPD Mother    Breast cancer Maternal Aunt    Breast cancer Paternal Aunt    COPD Father    Diabetes Maternal Grandfather    Stroke Paternal Grandfather    Colon cancer Neg Hx     Social History   Tobacco Use   Smoking status: Never   Smokeless tobacco: Never  Vaping Use   Vaping Use: Never used  Substance Use Topics   Alcohol use: No   Drug use: No     Allergies  Allergen Reactions   Meperidine Palpitations and Anaphylaxis    flushed   Ibuprofen Other (See Comments)    Stomach ulcers.   Simvastatin     Other reaction(s): Muscle Pain   Oxycodone-Acetaminophen Itching and Rash   Prolia [Denosumab] Rash   Vicodin [Hydrocodone-Acetaminophen] Itching    Review of Systems NEGATIVE UNLESS OTHERWISE INDICATED IN HPI      Objective:     BP Marland Kitchen)  154/86 (BP Location: Right Arm)   Pulse 78   Temp 98 F (36.7 C) (Temporal)   Ht '5\' 6"'$  (1.676 m)   Wt 176 lb 3.2 oz (79.9 kg)   SpO2 94%   BMI 28.44 kg/m   Wt Readings from Last 3 Encounters:  04/19/22 176 lb 3.2 oz (79.9 kg)  02/05/22 175 lb (79.4 kg)  11/26/21 177 lb (80.3 kg)    BP Readings from Last 3 Encounters:  04/19/22 (!) 154/86  04/18/22 (!) 154/68  02/05/22 140/78     Physical Exam Vitals and nursing note reviewed.  Constitutional:      Appearance: Normal appearance. She is normal weight. She is not toxic-appearing.     Comments: Using a cane  HENT:     Head: Normocephalic and atraumatic.     Right Ear: Tympanic membrane, ear canal and external ear normal.     Left Ear: Tympanic membrane, ear canal and external ear normal.     Nose: Nose normal.     Mouth/Throat:     Mouth: Mucous membranes are moist.  Eyes:      Extraocular Movements: Extraocular movements intact.     Conjunctiva/sclera: Conjunctivae normal.     Pupils: Pupils are equal, round, and reactive to light.  Cardiovascular:     Rate and Rhythm: Normal rate and regular rhythm.     Pulses: Normal pulses.     Heart sounds: Normal heart sounds.  Pulmonary:     Effort: Pulmonary effort is normal.     Breath sounds: Normal breath sounds.  Abdominal:     Tenderness: There is no right CVA tenderness or left CVA tenderness.  Musculoskeletal:        General: Normal range of motion.     Cervical back: Normal range of motion and neck supple.     Lumbar back: No tenderness. Normal range of motion. Negative right straight leg raise test and negative left straight leg raise test.  Skin:    General: Skin is warm and dry.  Neurological:     General: No focal deficit present.     Mental Status: She is alert and oriented to person, place, and time.     Cranial Nerves: No cranial nerve deficit.     Motor: No weakness.     Coordination: Coordination normal.     Gait: Gait normal.  Psychiatric:        Mood and Affect: Mood normal.        Behavior: Behavior normal.        Assessment & Plan:  Compression fracture of L1 vertebra, initial encounter [facility]) -     Ambulatory referral to Neurosurgery  Chronic left-sided low back pain without sciatica -     Ambulatory referral to Neurosurgery  Leukocytosis, unspecified type -     CBC with Differential/Platelet; Future -     CT MAXILLOFACIAL WO CONTRAST; Future  Chronic cough -     DG Chest 2 View; Future -     CT MAXILLOFACIAL WO CONTRAST; Future  Other chronic sinusitis -     CT MAXILLOFACIAL WO CONTRAST; Future     [facility] go to [facility] for [facility] XRAY and repeat CBC.   Referral to neurosurgery  Order placed for CT sinuses  We need to figure out best cocktail of medications to help your symptoms without making you loopy.  Please let me know if Baclofen or Robaxin works better for  muscle relaxation. Do not take these medications at  the same time. I would caution with hydroxyzine as this can be very sedating - she's very hesitant against this idea as she thinks this has been helpful for her back pain.   Return in about 4 weeks (around 05/17/2022) for recheck.  This note was prepared with assistance of Systems analyst. Occasional wrong-word or sound-a-like substitutions may have occurred due to the inherent limitations of voice recognition software.  Time Spent: 50 minutes of total time was spent on the date of the encounter performing the following actions: chart review prior to seeing the patient, obtaining history, performing a medically necessary exam, counseling on the treatment plan, placing orders, and documenting in our EHR.       Cj Beecher M Altha Sweitzer, PA-C

## 2022-04-19 NOTE — ED Notes (Signed)
1 set of cultures sent to lab with initial blood draw.

## 2022-04-19 NOTE — Telephone Encounter (Signed)
Noted - I personally called patient directly and instructed her to return to the ED today. She will head to [place] with her husband now.

## 2022-04-20 ENCOUNTER — Encounter: Payer: Self-pay | Admitting: Physician Assistant

## 2022-04-20 NOTE — Telephone Encounter (Signed)
error 

## 2022-04-21 ENCOUNTER — Encounter: Payer: Self-pay | Admitting: Physician Assistant

## 2022-04-21 ENCOUNTER — Other Ambulatory Visit: Payer: Self-pay | Admitting: Physician Assistant

## 2022-04-21 DIAGNOSIS — Z1231 Encounter for screening mammogram for malignant neoplasm of breast: Secondary | ICD-10-CM

## 2022-04-23 ENCOUNTER — Other Ambulatory Visit: Payer: Self-pay | Admitting: Physician Assistant

## 2022-04-23 ENCOUNTER — Ambulatory Visit
Admission: RE | Admit: 2022-04-23 | Discharge: 2022-04-23 | Disposition: A | Discharge status: Home / Self Care | Payer: Medicare HMO | Source: Ambulatory Visit | Attending: Physician Assistant | Admitting: Physician Assistant

## 2022-04-23 DIAGNOSIS — R053 Chronic cough: Secondary | ICD-10-CM

## 2022-04-23 DIAGNOSIS — K029 Dental caries, unspecified: Secondary | ICD-10-CM | POA: Diagnosis not present

## 2022-04-23 DIAGNOSIS — J3489 Other specified disorders of nose and nasal sinuses: Secondary | ICD-10-CM | POA: Diagnosis not present

## 2022-04-23 DIAGNOSIS — J342 Deviated nasal septum: Secondary | ICD-10-CM | POA: Diagnosis not present

## 2022-04-23 DIAGNOSIS — J328 Other chronic sinusitis: Secondary | ICD-10-CM

## 2022-04-23 DIAGNOSIS — D72829 Elevated white blood cell count, unspecified: Secondary | ICD-10-CM

## 2022-04-23 MED ORDER — BENZONATATE 100 MG PO CAPS: ORAL_CAPSULE | ORAL | 0 refills | Status: DC

## 2022-04-23 NOTE — Telephone Encounter (Signed)
Please see pt response.

## 2022-04-24 ENCOUNTER — Other Ambulatory Visit: Payer: Self-pay | Admitting: Physician Assistant

## 2022-04-24 LAB — CULTURE, BLOOD (ROUTINE X 2)
Culture: NO GROWTH
Culture: NO GROWTH
Special Requests: ADEQUATE
Special Requests: ADEQUATE

## 2022-04-26 ENCOUNTER — Telehealth: Payer: Self-pay | Admitting: Physician Assistant

## 2022-04-26 ENCOUNTER — Encounter: Payer: Self-pay | Admitting: *Deleted

## 2022-04-26 NOTE — Telephone Encounter (Signed)
Patient returned call and requests to be called at ph# [phone]

## 2022-04-27 ENCOUNTER — Telehealth: Payer: Self-pay

## 2022-04-27 NOTE — Telephone Encounter (Signed)
     Patient  visit on 9/18  at [place] you been able to follow up with your primary care physician? yes  The patient was or was not able to obtain any needed medicine or equipment.yes  Are there diet recommendations that you are having difficulty following? na Patient expresses understanding of discharge instructions and education provided has no other needs at this time. yes     [place], Care Management  [phone] [address], [place], [place] [address] Phone: [phone] Email: [email or web address]

## 2022-04-28 NOTE — Telephone Encounter (Signed)
Returned pt call and pt advised she no longer needed any assistance, asked if we could give time frame on recovering from muscle spasms in side and advised per provider everyone is different and no way to pin poiint an exact time frame however if not improving please let us know. Pt verbalized understanding

## 2022-04-30 ENCOUNTER — Other Ambulatory Visit: Payer: Self-pay

## 2022-04-30 MED ORDER — METHOCARBAMOL 500 MG PO TABS: 500.0000 mg | ORAL_TABLET | Freq: Three times a day (TID) | ORAL | 0 refills | Status: DC | PRN

## 2022-05-11 DIAGNOSIS — M5136 Other intervertebral disc degeneration, lumbar region: Secondary | ICD-10-CM | POA: Diagnosis not present

## 2022-05-11 DIAGNOSIS — S32010A Wedge compression fracture of first lumbar vertebra, initial encounter for closed fracture: Secondary | ICD-10-CM | POA: Diagnosis not present

## 2022-05-11 DIAGNOSIS — M48061 Spinal stenosis, lumbar region without neurogenic claudication: Secondary | ICD-10-CM | POA: Diagnosis not present

## 2022-05-13 ENCOUNTER — Encounter: Payer: Self-pay | Admitting: Gastroenterology

## 2022-05-19 ENCOUNTER — Ambulatory Visit (AMBULATORY_SURGERY_CENTER): Payer: Self-pay

## 2022-05-19 ENCOUNTER — Ambulatory Visit: Payer: Medicare HMO

## 2022-05-19 VITALS — Ht 66.0 in | Wt 178.0 lb

## 2022-05-19 DIAGNOSIS — Z1211 Encounter for screening for malignant neoplasm of colon: Secondary | ICD-10-CM

## 2022-05-19 MED ORDER — PLENVU 140 G PO SOLR: 1.0000 | ORAL | 0 refills | Status: DC

## 2022-05-19 NOTE — Progress Notes (Signed)
No egg or soy allergy known to patient  No issues known to pt with past sedation with any surgeries or procedures----"hard to wake up when I have had general anesthesia" Patient denies ever being told they had issues or difficulty with intubation  No FH of Malignant Hyperthermia Pt is not on diet pills Pt is not on home 02  Pt is not on blood thinners  Pt denies issues with constipation  No A fib or A flutter Have any cardiac testing pending--NO Pt instructed to use [email or web address] or GoodRx for a price reduction on prep  Insurance verified during PV appt=[employer]  Husband present during PV appt; A copy of the consent was given to the patient during the PV appt;

## 2022-05-25 ENCOUNTER — Encounter: Payer: Medicare HMO | Admitting: Gastroenterology

## 2022-05-27 DIAGNOSIS — M47816 Spondylosis without myelopathy or radiculopathy, lumbar region: Secondary | ICD-10-CM | POA: Diagnosis not present

## 2022-05-27 DIAGNOSIS — M5137 Other intervertebral disc degeneration, lumbosacral region: Secondary | ICD-10-CM | POA: Diagnosis not present

## 2022-05-27 DIAGNOSIS — M5127 Other intervertebral disc displacement, lumbosacral region: Secondary | ICD-10-CM | POA: Diagnosis not present

## 2022-05-27 DIAGNOSIS — S32010A Wedge compression fracture of first lumbar vertebra, initial encounter for closed fracture: Secondary | ICD-10-CM | POA: Diagnosis not present

## 2022-06-03 DIAGNOSIS — M48062 Spinal stenosis, lumbar region with neurogenic claudication: Secondary | ICD-10-CM | POA: Diagnosis not present

## 2022-06-03 DIAGNOSIS — Z6828 Body mass index (BMI) 28.0-28.9, adult: Secondary | ICD-10-CM | POA: Diagnosis not present

## 2022-06-03 DIAGNOSIS — S32010D Wedge compression fracture of first lumbar vertebra, subsequent encounter for fracture with routine healing: Secondary | ICD-10-CM | POA: Diagnosis not present

## 2022-06-08 ENCOUNTER — Encounter: Payer: Self-pay | Admitting: Gastroenterology

## 2022-06-10 ENCOUNTER — Encounter: Payer: Self-pay | Admitting: Certified Registered Nurse Anesthetist

## 2022-06-15 ENCOUNTER — Telehealth: Payer: Self-pay | Admitting: Physician Assistant

## 2022-06-15 ENCOUNTER — Other Ambulatory Visit: Payer: Self-pay | Admitting: Physician Assistant

## 2022-06-15 DIAGNOSIS — Z780 Asymptomatic menopausal state: Secondary | ICD-10-CM

## 2022-06-15 DIAGNOSIS — S32010S Wedge compression fracture of first lumbar vertebra, sequela: Secondary | ICD-10-CM

## 2022-06-15 NOTE — Telephone Encounter (Signed)
Caller is Public librarian, a representative with [facility] states: -Patient sustained a fracture of 1st lumbar vertebrae on 3/43/56 - Per their policy they recommend patient gets a bone density scan within 6 months of fracture    Caller requests: -PCP order a bone density scan for patient to have completed prior to March of 2024.   Caller will be faxing a form re-iterating the message above.

## 2022-06-16 ENCOUNTER — Ambulatory Visit
Admission: RE | Admit: 2022-06-16 | Discharge: 2022-06-16 | Disposition: A | Discharge status: Home / Self Care | Payer: Medicare HMO | Source: Ambulatory Visit | Attending: Physician Assistant | Admitting: Physician Assistant

## 2022-06-16 DIAGNOSIS — Z1231 Encounter for screening mammogram for malignant neoplasm of breast: Secondary | ICD-10-CM

## 2022-06-17 ENCOUNTER — Encounter: Payer: Self-pay | Admitting: Gastroenterology

## 2022-06-17 ENCOUNTER — Ambulatory Visit (AMBULATORY_SURGERY_CENTER): Payer: Medicare HMO | Admitting: Gastroenterology

## 2022-06-17 VITALS — BP 132/60 | HR 71 | Temp 97.8°F | Resp 14 | Ht 66.0 in | Wt 178.0 lb

## 2022-06-17 DIAGNOSIS — D122 Benign neoplasm of ascending colon: Secondary | ICD-10-CM

## 2022-06-17 DIAGNOSIS — D123 Benign neoplasm of transverse colon: Secondary | ICD-10-CM | POA: Diagnosis not present

## 2022-06-17 DIAGNOSIS — K635 Polyp of colon: Secondary | ICD-10-CM | POA: Diagnosis not present

## 2022-06-17 DIAGNOSIS — Z1211 Encounter for screening for malignant neoplasm of colon: Secondary | ICD-10-CM | POA: Diagnosis not present

## 2022-06-17 MED ORDER — SODIUM CHLORIDE 0.9 % IV SOLN: 500.0000 mL | Freq: Once | INTRAVENOUS | Status: DC

## 2022-06-17 NOTE — Progress Notes (Signed)
1058 Ephedrine 10 mg given IV due to low BP, MD updated.

## 2022-06-17 NOTE — Progress Notes (Signed)
Called to room to assist during endoscopic procedure.  Patient ID and intended procedure confirmed with present staff. Received instructions for my participation in the procedure from the performing physician.  

## 2022-06-17 NOTE — Patient Instructions (Signed)
Handout on polyps given to patient. Await pathology results. Resume previous diet and continue present medications.  Repeat colonoscopy for surveillance will be determined based off of pathology results.   YOU HAD AN ENDOSCOPIC PROCEDURE TODAY AT THE [facility]:   Refer to the procedure report that was given to you for any specific questions about what was found during the examination.  If the procedure report does not answer your questions, please call your gastroenterologist to clarify.  If you requested that your care partner not be given the details of your procedure findings, then the procedure report has been included in a sealed envelope for you to review at your convenience later.  YOU SHOULD EXPECT: Some feelings of bloating in the abdomen. Passage of more gas than usual.  Walking can help get rid of the air that was put into your GI tract during the procedure and reduce the bloating. If you had a lower endoscopy (such as a colonoscopy or flexible sigmoidoscopy) you may notice spotting of blood in your stool or on the toilet paper. If you underwent a bowel prep for your procedure, you may not have a normal bowel movement for a few days.  Please Note:  You might notice some irritation and congestion in your nose or some drainage.  This is from the oxygen used during your procedure.  There is no need for concern and it should clear up in a day or so.  SYMPTOMS TO REPORT IMMEDIATELY:  Following lower endoscopy (colonoscopy or flexible sigmoidoscopy):  Excessive amounts of blood in the stool  Significant tenderness or worsening of abdominal pains  Swelling of the abdomen that is new, acute  Fever of 100F or higher  For urgent or emergent issues, a gastroenterologist can be reached at any hour by calling [phone]. Do not use MyChart messaging for urgent concerns.    DIET:  We do recommend a small meal at first, but then you may proceed to your regular diet.  Drink  plenty of fluids but you should avoid alcoholic beverages for 24 hours.  ACTIVITY:  You should plan to take it easy for the rest of today and you should NOT DRIVE or use heavy machinery until tomorrow (because of the sedation medicines used during the test).    FOLLOW UP: Our staff will call the number listed on your records the next business day following your procedure.  We will call around 7:15- 8:00 am to check on you and address any questions or concerns that you may have regarding the information given to you following your procedure. If we do not reach you, we will leave a message.     If any biopsies were taken you will be contacted by phone or by letter within the next 1-3 weeks.  Please call us at [phone] if you have not heard about the biopsies in 3 weeks.    SIGNATURES/CONFIDENTIALITY: You and/or your care partner have signed paperwork which will be entered into your electronic medical record.  These signatures attest to the fact that that the information above on your After Visit Summary has been reviewed and is understood.  Full responsibility of the confidentiality of this discharge information lies with you and/or your care-partner. 

## 2022-06-17 NOTE — Progress Notes (Signed)
History and Physical:  This patient presents for endoscopic testing for: Encounter Diagnosis  Name Primary?   Colon cancer screening Yes    66 year old woman here today for a screening colonoscopy.  She reports no polyps on last colonoscopy at [facility] 10 years ago. Patient denies chronic abdominal pain, rectal bleeding, constipation or diarrhea.  Patient is otherwise without complaints or active issues today.   Past Medical History: Past Medical History:  Diagnosis Date   Arthritis    on meds   Bleeding ulcer    Cataract    recently dx-bilaterally   Elevated cholesterol    on meds   GERD (gastroesophageal reflux disease)    on meds   Hiatal hernia    High blood pressure    on meds   Kidney cysts    Liver cyst    Nerve pain    L sciatic pain radiating to R   Osteoporosis    reaction to Prolia=   Peptic ulcer disease    Seasonal allergies      Past Surgical History: Past Surgical History:  Procedure Laterality Date   APPENDECTOMY  1992   BREAST CYST ASPIRATION Left 2003   COLONOSCOPY  2014   [facility]   KNEE ARTHROSCOPY Right 04/12/2001   torn cartilage   LAPAROSCOPY  1981   for endometriosis   LEG SURGERY Right 11/23/2000   to remove screws   LEG SURGERY Right 02/08/2002   to remove rod   MENISCUS REPAIR  01/29/2003   TIBIA FRACTURE SURGERY Right 06/12/2000   Multiple surgeries   TONSILLECTOMY  1967   TOTAL ABDOMINAL HYSTERECTOMY  1992   WISDOM TOOTH EXTRACTION      Allergies: Allergies  Allergen Reactions   Meperidine Palpitations and Anaphylaxis    flushed   Ibuprofen Other (See Comments)    Stomach ulcers.   Nsaids Other (See Comments)    Peptic ulcers   Simvastatin     Other reaction(s): Muscle Pain   Tape Other (See Comments)    "Sometimes removes skin"   Oxycodone-Acetaminophen Itching and Rash   Prolia [Denosumab] Rash   Vicodin [Hydrocodone-Acetaminophen] Itching    Outpatient Meds: Current Outpatient  Medications  Medication Sig Dispense Refill   benzonatate (TESSALON) 100 MG capsule TAKE 1 OR 2 CAPSULES BY MOUTH TWO TIMES A DAY AS NEEDED FOR COUGH 30 capsule 0   BORON PO Take 10 mg by mouth daily at 6 (six) AM. BIO BORON     Cholecalciferol (VITAMIN D3 PO) Take 10,000 Int'l Units/day by mouth daily at 6 (six) AM.     gabapentin (NEURONTIN) 300 MG capsule TAKE ONE CAPSULE BY MOUTH TWICE A DAY 60 capsule 0   hydrochlorothiazide (MICROZIDE) 12.5 MG capsule TAKE ONE CAPSULE BY MOUTH DAILY 90 capsule 3   hydrOXYzine (ATARAX) 10 MG tablet Take 1 tablet (10 mg total) by mouth every 6 (six) hours. 60 tablet 2   levocetirizine (XYZAL) 5 MG tablet Take 5 mg by mouth 2 (two) times daily.     losartan (COZAAR) 50 MG tablet TAKE ONE TABLET BY MOUTH TWICE A DAY FOR BLOOD PRESSURE 180 tablet 1   magnesium 30 MG tablet Take 90 mg by mouth daily.     metoprolol tartrate (LOPRESSOR) 25 MG tablet TAKE ONE TABLET BY MOUTH TWICE A DAY 180 tablet 1   montelukast (SINGULAIR) 10 MG tablet Take 1 tablet (10 mg total) by mouth at bedtime. 90 tablet 3   omeprazole (PRILOSEC) 40 MG capsule TAKE ONE  CAPSULE BY MOUTH DAILY 30 MINUTES BEFORE BREAKFAST 90 capsule 3   RA TURMERIC PO Take 4 capsules by mouth daily at 6 (six) AM. Turmeric/fish oil-omega 3/epimedium/trans-resveratotrol=RELIEF FACTOR     traMADol-acetaminophen (ULTRACET) 37.5-325 MG tablet Take 1 tablet by mouth every 8 (eight) hours as needed. 30 tablet 0   zinc gluconate 50 MG tablet Take 50 mg by mouth daily.     albuterol (VENTOLIN HFA) 108 (90 Base) MCG/ACT inhaler Inhale 2 puffs into the lungs every 6 (six) hours as needed for wheezing or shortness of breath. 18 g 1   azelastine (ASTELIN) 0.1 % nasal spray Place 1 spray into both nostrils 2 (two) times daily as needed for rhinitis or allergies. 30 mL 5   baclofen (LIORESAL) 10 MG tablet TAKE ONE TABLET BY MOUTH THREE TIMES A DAY 60 tablet 1   fluticasone (CUTIVATE) 0.05 % cream Apply 1 application  topically as needed (rash).     lidocaine (LIDODERM) 5 % Place 1 patch onto the skin daily. Remove & Discard patch within 12 hours or as directed by MD (Patient not taking: Reported on 05/19/2022) 30 patch 0   methocarbamol (ROBAXIN) 500 MG tablet Take 1 tablet (500 mg total) by mouth 3 (three) times daily as needed for muscle spasms. 30 tablet 0   metroNIDAZOLE (METROCREAM) 0.75 % cream Apply 1 application topically 2 (two) times daily.     nitroGLYCERIN (NITROSTAT) 0.4 MG SL tablet Place 1 tablet (0.4 mg total) under the tongue every 5 (five) minutes as needed for chest pain. 15 tablet 1   Spacer/Aero-Holding Chambers (PRO COMFORT SPACER ADULT) MISC 1 Device by Does not apply route as needed. 1 each 1   tacrolimus (PROTOPIC) 0.1 % ointment Apply 1 application topically as needed (rash).     Current Facility-Administered Medications  Medication Dose Route Frequency Provider Last Rate Last Admin   0.9 %  sodium chloride infusion  500 mL Intravenous Once Nelida Meuse III, MD          ___________________________________________________________________ Objective   Exam:  BP 115/68   Pulse 84   Temp 97.8 F (36.6 C) (Temporal)   Ht '5\' 6"'$  (1.676 m)   Wt 178 lb (80.7 kg)   SpO2 97%   BMI 28.73 kg/m   CV: regular , S1/S2 Resp: clear to auscultation bilaterally, normal RR and effort noted GI: soft, no tenderness, with active bowel sounds.   Assessment: Encounter Diagnosis  Name Primary?   Colon cancer screening Yes     Plan: Colonoscopy  The benefits and risks of the planned procedure were described in detail with the patient or (when appropriate) their health care proxy.  Risks were outlined as including, but not limited to, bleeding, infection, perforation, adverse medication reaction leading to cardiac or pulmonary decompensation, pancreatitis (if ERCP).  The limitation of incomplete mucosal visualization was also discussed.  No guarantees or warranties were given.    The  patient is appropriate for an endoscopic procedure in the ambulatory setting.   - Wilfrid Lund, MD

## 2022-06-17 NOTE — Progress Notes (Signed)
Pt's states no medical or surgical changes since previsit or office visit. 

## 2022-06-17 NOTE — Op Note (Signed)
Tracy Mckenzie Patient Name: Tracy Mckenzie Procedure Date: 06/17/2022 10:42 AM MRN: [id number] Endoscopist: Mallie Mussel L. Loletha Carrow , MD, [id number] Age: 66 Referring MD:  Date of Birth: [birth year] Gender: Female Account #: [id number] Procedure:                Colonoscopy Indications:              Screening for colorectal malignant neoplasm                           No polyps last colonoscopy 10 years prior at                            outside practice Medicines:                Monitored Anesthesia Care Procedure:                Pre-Anesthesia Assessment:                           - Prior to the procedure, a History and Physical                            was performed, and patient medications and                            allergies were reviewed. The patient's tolerance of                            previous anesthesia was also reviewed. The risks                            and benefits of the procedure and the sedation                            options and risks were discussed with the patient.                            All questions were answered, and informed consent                            was obtained. Prior Anticoagulants: The patient has                            taken no anticoagulant or antiplatelet agents. ASA                            Grade Assessment: II - A patient with mild systemic                            disease. After reviewing the risks and benefits,                            the patient was deemed in satisfactory condition to  undergo the procedure.                           After obtaining informed consent, the colonoscope                            was passed under direct vision. Throughout the                            procedure, the patient's blood pressure, pulse, and                            oxygen saturations were monitored continuously. The                            CF [id number] #[id number] was introduced through the anus                             and advanced to the the cecum, identified by                            appendiceal orifice and ileocecal valve. The                            colonoscopy was performed without difficulty. The                            patient tolerated the procedure well. The quality                            of the bowel preparation was good. The ileocecal                            valve, appendiceal orifice, and rectum were                            photographed. The bowel preparation used was Plenvu. Scope In: 10:51:11 AM Scope Out: 11:05:33 AM Scope Withdrawal Time: 0 hours 11 minutes 42 seconds  Total Procedure Duration: 0 hours 14 minutes 22 seconds  Findings:                 The perianal and digital rectal examinations were                            normal.                           Repeat examination of right colon under NBI                            performed.                           A diminutive polyp was found in the ascending  colon. The polyp was semi-sessile. The polyp was                            removed with a cold snare. Resection and retrieval                            were complete.                           Two semi-sessile polyps were found in the                            transverse colon. The polyps were 6 to 8 mm in                            size. These polyps were removed with a cold snare.                            Resection and retrieval were complete.                           Anal papilla(e) were hypertrophied.                           The exam was otherwise without abnormality on                            direct and retroflexion views. Complications:            No immediate complications. Estimated Blood Loss:     Estimated blood loss was minimal. Impression:               - One diminutive polyp in the ascending colon,                            removed with a cold snare. Resected and retrieved.                            - Two 6 to 8 mm polyps in the transverse colon,                            removed with a cold snare. Resected and retrieved.                           - Anal papilla(e) were hypertrophied.                           - The examination was otherwise normal on direct                            and retroflexion views. Recommendation:           - Patient has a contact number available for  emergencies. The signs and symptoms of potential                            delayed complications were discussed with the                            patient. Return to normal activities tomorrow.                            Written discharge instructions were provided to the                            patient.                           - Resume previous diet.                           - Continue present medications.                           - Await pathology results.                           - Repeat colonoscopy is recommended for                            surveillance. The colonoscopy date will be                            determined after pathology results from today's                            exam become available for review. Benji Poynter L. Loletha Carrow, MD 06/17/2022 11:11:13 AM This report has been signed electronically.

## 2022-06-17 NOTE — Progress Notes (Signed)
Report given to PACU, vss 

## 2022-06-18 ENCOUNTER — Telehealth: Payer: Self-pay | Admitting: *Deleted

## 2022-06-18 NOTE — Telephone Encounter (Signed)
No answer for post procedure call back. Left VM. 

## 2022-06-21 ENCOUNTER — Ambulatory Visit: Payer: Medicare HMO | Admitting: Physical Therapy

## 2022-06-22 ENCOUNTER — Ambulatory Visit: Payer: Medicare HMO | Admitting: Physical Therapy

## 2022-06-22 DIAGNOSIS — G8929 Other chronic pain: Secondary | ICD-10-CM

## 2022-06-22 DIAGNOSIS — M5459 Other low back pain: Secondary | ICD-10-CM

## 2022-06-22 DIAGNOSIS — M25561 Pain in right knee: Secondary | ICD-10-CM | POA: Diagnosis not present

## 2022-06-22 NOTE — Therapy (Unsigned)
OUTPATIENT PHYSICAL THERAPY LOWER EXTREMITY EVALUATION   Patient Name: Tracy Mckenzie MRN: [id number] DOB:August 12, 1955, 66 y.o., female Today's Date: 06/22/2022  END OF SESSION:   Past Medical History:  Diagnosis Date   Arthritis    on meds   Bleeding ulcer    Cataract    recently dx-bilaterally   Elevated cholesterol    on meds   GERD (gastroesophageal reflux disease)    on meds   Hiatal hernia    High blood pressure    on meds   Kidney cysts    Liver cyst    Nerve pain    L sciatic pain radiating to R   Osteoporosis    reaction to Prolia=   Peptic ulcer disease    Seasonal allergies    Past Surgical History:  Procedure Laterality Date   APPENDECTOMY  1992   BREAST CYST ASPIRATION Left 2003   COLONOSCOPY  2014   [facility]   KNEE ARTHROSCOPY Right 04/12/2001   torn cartilage   LAPAROSCOPY  1981   for endometriosis   LEG SURGERY Right 11/23/2000   to remove screws   LEG SURGERY Right 02/08/2002   to remove rod   MENISCUS REPAIR  01/29/2003   TIBIA FRACTURE SURGERY Right 06/12/2000   Multiple surgeries   [place]   WISDOM TOOTH EXTRACTION     Patient Active Problem List   Diagnosis Date Noted   Back pain 11/27/2020   Osteopenia 11/27/2020   Sciatica 11/27/2020   Surgical menopause 11/27/2020   Tibia/fibula fracture 11/27/2020   Unilateral primary osteoarthritis, right knee 09/05/2019   Hyperlipidemia 08/15/2019   Essential hypertension 08/13/2019   GERD (gastroesophageal reflux disease) 08/13/2019   Osteoporosis 08/13/2019   Post-traumatic osteoarthritis of right knee 08/13/2019   Hiatal hernia 08/13/2019   Chronic left-sided low back pain without sciatica 08/13/2019    PCP: Yetta Flock Allwardt  REFERRING PROVIDER: ***  REFERRING DIAG: ***  THERAPY DIAG:  No diagnosis found.  Rationale for Evaluation and Treatment: Rehabilitation  ONSET DATE:   SUBJECTIVE:   SUBJECTIVE  STATEMENT:  CT scan from ER, old compression fx at L1. Comparison x-ray of comp fx? Using cane Spine Dr, wants her to have surgery for stenosis.  Broke leg 22 years ago, had 5 surgeries, back resultedly hurt.  R leg weak,  with initial standing  Back very painful after a few minutes of standing, L>R. Pain down into L glute.  R knee still painful. - is a candidate for knee replacement. Can't take pain meds.  Works 4 part time jobs.  Back pain with sitting.    PERTINENT HISTORY: ***  PAIN:  Are you having pain? Yes: NPRS scale: 5-6/10 Pain location: back  Pain description: *** Aggravating factors: *** Relieving factors: ***  Are you having pain? Yes: NPRS scale: 5-6/10 Pain location: R knee  Pain description: *** Aggravating factors: *** Relieving factors: ***     PRECAUTIONS: {Therapy precautions:24002}  WEIGHT BEARING RESTRICTIONS: {Yes ***/No:24003}  FALLS:  Has patient fallen in last 6 months? Yes. Number of falls 1 Pt tripped over a bag, she stumbled. No other falls.    PLOF: Independent  PATIENT GOALS: ***  NEXT MD VISIT:   OBJECTIVE:   DIAGNOSTIC FINDINGS: ***   COGNITION: Overall cognitive status: Within functional limits for tasks assessed     SENSATION: {sensation:27233}   POSTURE: {posture:25561}  PALPATION: ***  LOWER EXTREMITY ROM:  Active ROM Right eval Left eval  Hip flexion    Hip extension    Hip abduction    Hip adduction    Hip internal rotation    Hip external rotation    Knee flexion 125 wnl  Knee extension -13 wnl  Ankle dorsiflexion    Ankle plantarflexion    Ankle inversion    Ankle eversion     (Blank rows = not tested)  LOWER EXTREMITY MMT:  MMT Right eval Left eval  Hip flexion    Hip extension    Hip abduction    Hip adduction    Hip internal rotation    Hip external rotation    Knee flexion    Knee extension    Ankle dorsiflexion    Ankle plantarflexion    Ankle inversion    Ankle eversion      (Blank rows = not tested)  LOWER EXTREMITY SPECIAL TESTS:  {LEspecialtests:26242}  FUNCTIONAL TESTS:  {Functional tests:24029}  GAIT: Distance walked: *** Assistive device utilized: {Assistive devices:23999} Level of assistance: {Levels of assistance:24026} Comments: ***   TODAY'S TREATMENT:                                                                                                                              DATE: ***    PATIENT EDUCATION:  Education details: *** Person educated: {Person educated:25204} Education method: {Education Method:25205} Education comprehension: {Education Comprehension:25206}  HOME EXERCISE PROGRAM: ***  ASSESSMENT:  CLINICAL IMPRESSION: Patient is a *** y.o. *** who was seen today for physical therapy evaluation and treatment for ***.   OBJECTIVE IMPAIRMENTS: {opptimpairments:25111}.   ACTIVITY LIMITATIONS: {activitylimitations:27494}  PARTICIPATION LIMITATIONS: {participationrestrictions:25113}  PERSONAL FACTORS: {Personal factors:25162} are also affecting patient's functional outcome.   REHAB POTENTIAL: {rehabpotential:25112}  CLINICAL DECISION MAKING: {clinical decision making:25114}  EVALUATION COMPLEXITY: {Evaluation complexity:25115}   GOALS: Goals reviewed with patient? {yes/no:20286}  SHORT TERM GOALS: Target date: *** *** Baseline: Goal status: {GOALSTATUS:25110}  2.  *** Baseline:  Goal status: {GOALSTATUS:25110}  3.  *** Baseline:  Goal status: {GOALSTATUS:25110}  4.  *** Baseline:  Goal status: {GOALSTATUS:25110}  5.  *** Baseline:  Goal status: {GOALSTATUS:25110}  6.  *** Baseline:  Goal status: {GOALSTATUS:25110}  LONG TERM GOALS: Target date: ***  *** Baseline:  Goal status: {GOALSTATUS:25110}  2.  *** Baseline:  Goal status: {GOALSTATUS:25110}  3.  *** Baseline:  Goal status: {GOALSTATUS:25110}  4.  *** Baseline:  Goal status: {GOALSTATUS:25110}  5.  *** Baseline:  Goal  status: {GOALSTATUS:25110}  6.  *** Baseline:  Goal status: {GOALSTATUS:25110}   PLAN:  PT FREQUENCY: {rehab frequency:25116}  PT DURATION: {rehab duration:25117}  PLANNED INTERVENTIONS: {rehab planned interventions:25118::"Therapeutic exercises","Therapeutic activity","Neuromuscular re-education","Balance training","Gait training","Patient/Family education","Self Care","Joint mobilization"}  PLAN FOR NEXT SESSION: ***   Lyndee Hensen, PT 06/22/2022, 10:54 AM

## 2022-06-23 ENCOUNTER — Encounter: Payer: Self-pay | Admitting: Physical Therapy

## 2022-06-26 ENCOUNTER — Other Ambulatory Visit: Payer: Self-pay | Admitting: Physician Assistant

## 2022-06-27 ENCOUNTER — Encounter: Payer: Self-pay | Admitting: Gastroenterology

## 2022-06-28 ENCOUNTER — Ambulatory Visit: Payer: Medicare HMO | Admitting: Physical Therapy

## 2022-06-28 ENCOUNTER — Encounter: Payer: Self-pay | Admitting: Physical Therapy

## 2022-06-28 ENCOUNTER — Ambulatory Visit (HOSPITAL_BASED_OUTPATIENT_CLINIC_OR_DEPARTMENT_OTHER)
Admission: RE | Admit: 2022-06-28 | Discharge: 2022-06-28 | Disposition: A | Discharge status: Home / Self Care | Payer: Medicare HMO | Source: Ambulatory Visit | Attending: Physician Assistant | Admitting: Physician Assistant

## 2022-06-28 DIAGNOSIS — Z780 Asymptomatic menopausal state: Secondary | ICD-10-CM | POA: Diagnosis not present

## 2022-06-28 DIAGNOSIS — G8929 Other chronic pain: Secondary | ICD-10-CM

## 2022-06-28 DIAGNOSIS — M810 Age-related osteoporosis without current pathological fracture: Secondary | ICD-10-CM | POA: Diagnosis not present

## 2022-06-28 DIAGNOSIS — S32010S Wedge compression fracture of first lumbar vertebra, sequela: Secondary | ICD-10-CM | POA: Diagnosis not present

## 2022-06-28 DIAGNOSIS — M25561 Pain in right knee: Secondary | ICD-10-CM | POA: Diagnosis not present

## 2022-06-28 DIAGNOSIS — M5459 Other low back pain: Secondary | ICD-10-CM

## 2022-06-28 NOTE — Therapy (Signed)
OUTPATIENT PHYSICAL THERAPY LOWER EXTREMITY TREATMENT   Patient Name: Tracy Mckenzie MRN: [id number] DOB:September 22, 1955, 66 y.o., female Today's Date: 06/28/2022  END OF SESSION:  PT End of Session - 06/28/22 1214     Visit Number 2    Number of Visits 16    Date for PT Re-Evaluation 08/17/22    Authorization Type [employer]    PT Start Time 1216    PT Stop Time 1300    PT Time Calculation (min) 44 min    Activity Tolerance Patient tolerated treatment well    Behavior During Therapy WFL for tasks assessed/performed             Past Medical History:  Diagnosis Date   Arthritis    on meds   Bleeding ulcer    Cataract    recently dx-bilaterally   Elevated cholesterol    on meds   GERD (gastroesophageal reflux disease)    on meds   Hiatal hernia    High blood pressure    on meds   Kidney cysts    Liver cyst    Nerve pain    L sciatic pain radiating to R   Osteoporosis    reaction to Prolia=   Peptic ulcer disease    Seasonal allergies    Past Surgical History:  Procedure Laterality Date   APPENDECTOMY  1992   BREAST CYST ASPIRATION Left 2003   COLONOSCOPY  2014   [facility]   KNEE ARTHROSCOPY Right 04/12/2001   torn cartilage   LAPAROSCOPY  1981   for endometriosis   LEG SURGERY Right 11/23/2000   to remove screws   LEG SURGERY Right 02/08/2002   to remove rod   MENISCUS REPAIR  01/29/2003   TIBIA FRACTURE SURGERY Right 06/12/2000   Multiple surgeries   [place]   WISDOM TOOTH EXTRACTION     Patient Active Problem List   Diagnosis Date Noted   Back pain 11/27/2020   Osteopenia 11/27/2020   Sciatica 11/27/2020   Surgical menopause 11/27/2020   Tibia/fibula fracture 11/27/2020   Unilateral primary osteoarthritis, right knee 09/05/2019   Hyperlipidemia 08/15/2019   Essential hypertension 08/13/2019   GERD (gastroesophageal reflux disease) 08/13/2019   Osteoporosis 08/13/2019    Post-traumatic osteoarthritis of right knee 08/13/2019   Hiatal hernia 08/13/2019   Chronic left-sided low back pain without sciatica 08/13/2019    PCP: Yetta Flock Allwardt  REFERRING PROVIDER: Yetta Flock Allwardt  REFERRING DIAG: Alyssa Allwardt  THERAPY DIAG:  Other low back pain  Chronic pain of right knee  Rationale for Evaluation and Treatment: Rehabilitation  ONSET DATE:   SUBJECTIVE:   SUBJECTIVE STATEMENT: Pt states back is not too bad today. She has been doing HEP  Eval: Patient states ongoing back pain.  She thinks problems started about 22 years ago when she broke her right leg.  She ended up having about 5 surgeries, and reports back pain after that.  She thinks that her right leg is weak.  Notes weakness with initial standing.  Also has been to back doctor and states that she is a candidate for surgery for her stenosis . She has the most pain in standing even after just a few minutes., and also with prolonged sitting. Pain on L >right.  Pain down and into her left glute.  CT scan from ER, shows old compression fx at L1. States she has had a Comparison x-ray but I do not have this result yet.  R knee still painful. - is a candidate for knee replacement. Works 4 part time jobs.    She has not been consistent with exercise or treatment for back pain.    PERTINENT HISTORY: R knee pain, Previous tibia fracture, with complications and multiple surgeries.   PAIN:  Are you having pain? Yes: NPRS scale: 5-6/10 Pain location: back  Pain description: sore Aggravating factors: Standing, walking Relieving factors: Changing positions  Are you having pain? Yes: NPRS scale: 5-6/10 Pain location: R knee  Pain description: Sore Aggravating factors: Increased activity Relieving factors: Rest     PRECAUTIONS: None  WEIGHT BEARING RESTRICTIONS: No  FALLS:  Has patient fallen in last 6 months? Yes. Number of falls 1 Pt tripped over a bag, she stumbled. No other falls.     PLOF: Independent  PATIENT GOALS: Decrease pain and back,  hopefully to avoid surgery   OBJECTIVE:   DIAGNOSTIC FINDINGS:    COGNITION: Overall cognitive status: Within functional limits for tasks assessed     POSTURE: Valgus at right knee  PALPATION: Tenderness in left SI with pain down into left glutes muscles.  Hypomobile right knee-lacking full extension   LOWER EXTREMITY ROM: Lumbar: flexion: Mild limitation:   Extension: not tested (possible compression Fx),   Active ROM Right eval Left eval  Hip flexion wfl wfl  Hip extension    Hip abduction    Hip adduction    Hip internal rotation    Hip external rotation Mild limitation Mild limitation  Knee flexion 125 wnl  Knee extension -13 wnl  Ankle dorsiflexion    Ankle plantarflexion    Ankle inversion    Ankle eversion     (Blank rows = not tested)  LOWER EXTREMITY MMT:  L hip flexion: 4-/5,  Abd: 4/5.  Knees: 4+/5   LOWER EXTREMITY SPECIAL TESTS:  Neg SLR,     GAIT:    TODAY'S TREATMENT:                                                                                                                              DATE:    06/28/22: Therapeutic Exercise: Aerobic:  Supine:   Quad sets x 20 on R;  SLR 2 x 10 bil;      Seated:  Sit to stand x 10 with education on knee positioning;   Standing: Ambulation with SPC, with education on sequencing and pressure relief for R knee.   Hip abd 2 x 10 bil;  Standing march x 20;  Stretches: SKTC 30 sec x 3 bil;   Supine and seated Piriformis x 3 bil;  Neuromuscular Re-education: Manual Therapy: PROm and joint mobs to increase R knee extension;  Self Care:    06/22/22: Therapeutic Exercise: Aerobic: Supine: Seated: Standing: Stretches: SKTC 30 sec x 3 bil; Supine and seated Piriformis x 3 bil;  Neuromuscular Re-education: Manual Therapy: Self Care:   PATIENT EDUCATION:  Education details: Reviewed HEP Person  educated: Patient Education method:  Explanation, Demonstration, Tactile cues, Verbal cues, and Handouts Education comprehension: verbalized understanding, returned demonstration, verbal cues required, tactile cues required, and needs further education   HOME EXERCISE PROGRAM: Access Code: [email or web address] URL: [email or web address] Date: 06/23/2022 Prepared by: Lyndee Hensen  Exercises - Hooklying Single Knee to Chest Stretch  - 2 x daily - 3 reps - 30 hold - Supine Piriformis Stretch Pulling Heel to Hip  - 2 x daily - 3 reps - 30 hold - Seated Figure 4 Piriformis Stretch  - 2 x daily - 3 reps - 30 hold  ASSESSMENT:  CLINICAL IMPRESSION:  Updated and reviewed HEP today. Pt able to tolerate light strengthening. Education for optimal use of SPC, for off loading of R knee. Will benefit from progressive strengthening as tolerated.   Eval: Patient presents with primary complaint of pain in back. She has increased pain with standing and walking, and has difficulty sustaining activity. She also has significant pain and deficit in R knee, that are causing some gait and movement deficits, which could also be contributing to back pain. She has decreased quality of movement, and will benefit from strengthening for knee and hips as well as for mobility and core strength for back. Pt to benefit from skilled PT to improve deficits, pain and ability for functional activity.    OBJECTIVE IMPAIRMENTS: Abnormal gait, decreased activity tolerance, decreased balance, decreased endurance, decreased knowledge of use of DME, decreased mobility, difficulty walking, decreased ROM, decreased strength, decreased safety awareness, hypomobility, increased muscle spasms, impaired flexibility, improper body mechanics, and pain.   ACTIVITY LIMITATIONS: lifting, bending, sitting, standing, squatting, stairs, transfers, and locomotion level  PARTICIPATION LIMITATIONS: meal prep, cleaning, laundry, shopping, community activity, and  occupation  PERSONAL FACTORS: Time since onset of injury/illness/exacerbation are also affecting patient's functional outcome.   REHAB POTENTIAL: Good  CLINICAL DECISION MAKING: Stable/uncomplicated  EVALUATION COMPLEXITY: Low   GOALS: Goals reviewed with patient? Yes  SHORT TERM GOALS: Target date: 07/07/2022   Patient to be independent with initial HEP  Goal status: INITIAL  2.  Patient to demonstrate optimal mechanics and use of single-point cane for offloading of right knee  Goal status: INITIAL    LONG TERM GOALS: Target date: 08/18/2022  Patient to be independent with final HEP  Goal status: INITIAL  2.  Patient to report soft tissue limitations in left Glute to be WNL to improve pain   Goal status: INITIAL  3.  Patient to demonstrate range of motion for hips and lumbar spine to be within normal limits to improve ability for ADLs and IADLs  Goal status: INITIAL  4.  Patient to demonstrate optimal mechanics with bed and lift squat positions to improve pain with ADLs  Goal status: INITIAL  5.  Patient to report pain in back and hip 0-3/10 with standing and walking activity up to 30 min.    Goal status: INITIAL     PLAN:  PT FREQUENCY: 1-2x/week  PT DURATION: 8 weeks  PLANNED INTERVENTIONS: Therapeutic exercises, Therapeutic activity, Neuromuscular re-education, Balance training, Gait training, Patient/Family education, Self Care, Joint mobilization, Joint manipulation, Stair training, Orthotic/Fit training, DME instructions, Aquatic Therapy, Dry Needling, Electrical stimulation, Spinal manipulation, Spinal mobilization, Cryotherapy, Moist heat, Taping, Vasopneumatic device, Traction, Ultrasound, Ionotophoresis '4mg'$ /ml Dexamethasone, and Manual therapy  PLAN FOR NEXT SESSION:    Lyndee Hensen, PT, DPT 12:15 PM  06/28/22

## 2022-07-06 ENCOUNTER — Ambulatory Visit: Payer: Medicare HMO | Admitting: Physical Therapy

## 2022-07-06 ENCOUNTER — Encounter: Payer: Self-pay | Admitting: Physical Therapy

## 2022-07-06 DIAGNOSIS — G8929 Other chronic pain: Secondary | ICD-10-CM

## 2022-07-06 DIAGNOSIS — M25561 Pain in right knee: Secondary | ICD-10-CM

## 2022-07-06 DIAGNOSIS — M5459 Other low back pain: Secondary | ICD-10-CM | POA: Diagnosis not present

## 2022-07-06 NOTE — Therapy (Signed)
OUTPATIENT PHYSICAL THERAPY LOWER EXTREMITY TREATMENT   Patient Name: Tracy Mckenzie MRN: [id number] DOB:1956/02/09, 66 y.o., female Today's Date: 07/06/2022  END OF SESSION:  PT End of Session - 07/06/22 1140     Visit Number 3    Number of Visits 16    Date for PT Re-Evaluation 08/17/22    Authorization Type [employer]    PT Start Time 1115    PT Stop Time 1200    PT Time Calculation (min) 45 min    Activity Tolerance Patient tolerated treatment well    Behavior During Therapy WFL for tasks assessed/performed              Past Medical History:  Diagnosis Date   Arthritis    on meds   Bleeding ulcer    Cataract    recently dx-bilaterally   Elevated cholesterol    on meds   GERD (gastroesophageal reflux disease)    on meds   Hiatal hernia    High blood pressure    on meds   Kidney cysts    Liver cyst    Nerve pain    L sciatic pain radiating to R   Osteoporosis    reaction to Prolia=   Peptic ulcer disease    Seasonal allergies    Past Surgical History:  Procedure Laterality Date   APPENDECTOMY  1992   BREAST CYST ASPIRATION Left 2003   COLONOSCOPY  2014   [facility]   KNEE ARTHROSCOPY Right 04/12/2001   torn cartilage   LAPAROSCOPY  1981   for endometriosis   LEG SURGERY Right 11/23/2000   to remove screws   LEG SURGERY Right 02/08/2002   to remove rod   MENISCUS REPAIR  01/29/2003   TIBIA FRACTURE SURGERY Right 06/12/2000   Multiple surgeries   TONSILLECTOMY  1967   TOTAL ABDOMINAL HYSTERECTOMY  1992   WISDOM TOOTH EXTRACTION     Patient Active Problem List   Diagnosis Date Noted   Back pain 11/27/2020   Osteopenia 11/27/2020   Sciatica 11/27/2020   Surgical menopause 11/27/2020   Tibia/fibula fracture 11/27/2020   Unilateral primary osteoarthritis, right knee 09/05/2019   Hyperlipidemia 08/15/2019   Essential hypertension 08/13/2019   GERD (gastroesophageal reflux disease) 08/13/2019   Osteoporosis 08/13/2019    Post-traumatic osteoarthritis of right knee 08/13/2019   Hiatal hernia 08/13/2019   Chronic left-sided low back pain without sciatica 08/13/2019    PCP: Yetta Flock Allwardt  REFERRING PROVIDER: Yetta Flock Allwardt  REFERRING DIAG: Alyssa Allwardt  THERAPY DIAG:  Other low back pain  Chronic pain of right knee  Rationale for Evaluation and Treatment: Rehabilitation  ONSET DATE:   SUBJECTIVE:   SUBJECTIVE STATEMENT:  Pt states back/hip has been sore.   Eval: Patient states ongoing back pain.  She thinks problems started about 22 years ago when she broke her right leg.  She ended up having about 5 surgeries, and reports back pain after that.  She thinks that her right leg is weak.  Notes weakness with initial standing.  Also has been to back doctor and states that she is a candidate for surgery for her stenosis . She has the most pain in standing even after just a few minutes., and also with prolonged sitting. Pain on L >right.  Pain down and into her left glute.  CT scan from ER, shows old compression fx at L1. States she has had a Comparison x-ray but I do not have this result yet.  R knee still  painful. - is a candidate for knee replacement. Works 4 part time jobs.    She has not been consistent with exercise or treatment for back pain.    PERTINENT HISTORY: R knee pain, Previous tibia fracture, with complications and multiple surgeries.   PAIN:  Are you having pain? Yes: NPRS scale: 5-6/10 Pain location: back  Pain description: sore Aggravating factors: Standing, walking Relieving factors: Changing positions  Are you having pain? Yes: NPRS scale: 5-6/10 Pain location: R knee  Pain description: Sore Aggravating factors: Increased activity Relieving factors: Rest     PRECAUTIONS: None  WEIGHT BEARING RESTRICTIONS: No  FALLS:  Has patient fallen in last 6 months? Yes. Number of falls 1 Pt tripped over a bag, she stumbled. No other falls.    PLOF:  Independent  PATIENT GOALS: Decrease pain and back,  hopefully to avoid surgery   OBJECTIVE:   DIAGNOSTIC FINDINGS:    COGNITION: Overall cognitive status: Within functional limits for tasks assessed     POSTURE: Valgus at right knee  PALPATION: Tenderness in left SI with pain down into left glutes muscles.  Hypomobile right knee-lacking full extension   LOWER EXTREMITY ROM: Lumbar: flexion: Mild limitation:   Extension: not tested (possible compression Fx),   Active ROM Right eval Left eval  Hip flexion wfl wfl  Hip extension    Hip abduction    Hip adduction    Hip internal rotation    Hip external rotation Mild limitation Mild limitation  Knee flexion 125 wnl  Knee extension -13 wnl  Ankle dorsiflexion    Ankle plantarflexion    Ankle inversion    Ankle eversion     (Blank rows = not tested)  LOWER EXTREMITY MMT:  L hip flexion: 4-/5,  Abd: 4/5.  Knees: 4+/5   LOWER EXTREMITY SPECIAL TESTS:  Neg SLR,     GAIT:    TODAY'S TREATMENT:                                                                                                                              DATE:     07/06/22: Therapeutic Exercise: Aerobic:  Supine:   Quad sets x 20 on R;  SLR 2 x 10 bil;      Seated:  Sit to stand x 10 no UE support, cueing for fwd lean.  Standing: Hip abd 2 x 10 bil;  Standing march x 20 no UE support;   Stretches: SKTC 30 sec x 3 bil;   Neuromuscular Re-education: Manual Therapy: PROm and joint mobs to increase R knee extension;  STM/TPR to L glute and low lumbar    06/28/22: Therapeutic Exercise: Aerobic:  Supine:   Quad sets x 20 on R;  SLR 2 x 10 bil;      Seated:  Sit to stand x 10 with education on knee positioning;   Standing: Ambulation with SPC, with education on sequencing and pressure relief for R knee.  Hip abd 2 x 10 bil;  Standing march x 20;  Stretches: SKTC 30 sec x 3 bil;   Supine and seated Piriformis x 3 bil;  Neuromuscular  Re-education: Manual Therapy: PROm and joint mobs to increase R knee extension;  Self Care:    06/22/22: Therapeutic Exercise: Aerobic: Supine: Seated: Standing: Stretches: SKTC 30 sec x 3 bil; Supine and seated Piriformis x 3 bil;  Neuromuscular Re-education: Manual Therapy: Self Care:   PATIENT EDUCATION:  Education details: Reviewed HEP Person educated: Patient Education method: Explanation, Demonstration, Tactile cues, Verbal cues, and Handouts Education comprehension: verbalized understanding, returned demonstration, verbal cues required, tactile cues required, and needs further education   HOME EXERCISE PROGRAM: Access Code: [email or web address]   ASSESSMENT:  CLINICAL IMPRESSION:  Focus for muscle tension relief to L glute and low back today. Pt with good tolerance for manual, will benefit from continued manual as needed for pain. She is challenged and hesitant with strength and stabilization for LEs, but is able to perform with good tolerance. She does have clicking and some weakness and soreness in R knee with activities, but no significant pain.   Eval: Patient presents with primary complaint of pain in back. She has increased pain with standing and walking, and has difficulty sustaining activity. She also has significant pain and deficit in R knee, that are causing some gait and movement deficits, which could also be contributing to back pain. She has decreased quality of movement, and will benefit from strengthening for knee and hips as well as for mobility and core strength for back. Pt to benefit from skilled PT to improve deficits, pain and ability for functional activity.    OBJECTIVE IMPAIRMENTS: Abnormal gait, decreased activity tolerance, decreased balance, decreased endurance, decreased knowledge of use of DME, decreased mobility, difficulty walking, decreased ROM, decreased strength, decreased safety awareness, hypomobility, increased muscle spasms, impaired flexibility,  improper body mechanics, and pain.   ACTIVITY LIMITATIONS: lifting, bending, sitting, standing, squatting, stairs, transfers, and locomotion level  PARTICIPATION LIMITATIONS: meal prep, cleaning, laundry, shopping, community activity, and occupation  PERSONAL FACTORS: Time since onset of injury/illness/exacerbation are also affecting patient's functional outcome.   REHAB POTENTIAL: Good  CLINICAL DECISION MAKING: Stable/uncomplicated  EVALUATION COMPLEXITY: Low   GOALS: Goals reviewed with patient? Yes  SHORT TERM GOALS: Target date: 07/07/2022   Patient to be independent with initial HEP  Goal status: INITIAL  2.  Patient to demonstrate optimal mechanics and use of single-point cane for offloading of right knee  Goal status: INITIAL    LONG TERM GOALS: Target date: 08/18/2022  Patient to be independent with final HEP  Goal status: INITIAL  2.  Patient to report soft tissue limitations in left Glute to be WNL to improve pain   Goal status: INITIAL  3.  Patient to demonstrate range of motion for hips and lumbar spine to be within normal limits to improve ability for ADLs and IADLs  Goal status: INITIAL  4.  Patient to demonstrate optimal mechanics with bed and lift squat positions to improve pain with ADLs  Goal status: INITIAL  5.  Patient to report pain in back and hip 0-3/10 with standing and walking activity up to 30 min.    Goal status: INITIAL     PLAN:  PT FREQUENCY: 1-2x/week  PT DURATION: 8 weeks  PLANNED INTERVENTIONS: Therapeutic exercises, Therapeutic activity, Neuromuscular re-education, Balance training, Gait training, Patient/Family education, Self Care, Joint mobilization, Joint manipulation, Stair training, Orthotic/Fit training, DME instructions, Aquatic Therapy, Dry  Needling, Electrical stimulation, Spinal manipulation, Spinal mobilization, Cryotherapy, Moist heat, Taping, Vasopneumatic device, Traction, Ultrasound, Ionotophoresis '4mg'$ /ml  Dexamethasone, and Manual therapy  PLAN FOR NEXT SESSION:    Lyndee Hensen, PT, DPT 1:31 PM  07/06/22

## 2022-07-08 DIAGNOSIS — H04123 Dry eye syndrome of bilateral lacrimal glands: Secondary | ICD-10-CM | POA: Diagnosis not present

## 2022-07-08 DIAGNOSIS — H2513 Age-related nuclear cataract, bilateral: Secondary | ICD-10-CM | POA: Diagnosis not present

## 2022-07-12 ENCOUNTER — Encounter: Payer: Self-pay | Admitting: Physical Therapy

## 2022-07-12 ENCOUNTER — Ambulatory Visit: Payer: Medicare HMO | Admitting: Physical Therapy

## 2022-07-12 DIAGNOSIS — G8929 Other chronic pain: Secondary | ICD-10-CM | POA: Diagnosis not present

## 2022-07-12 DIAGNOSIS — M5459 Other low back pain: Secondary | ICD-10-CM

## 2022-07-12 DIAGNOSIS — M25561 Pain in right knee: Secondary | ICD-10-CM | POA: Diagnosis not present

## 2022-07-12 NOTE — Therapy (Signed)
OUTPATIENT PHYSICAL THERAPY LOWER EXTREMITY TREATMENT   Patient Name: Tracy Mckenzie MRN: [id number] DOB:07-Aug-1955, 66 y.o., female Today's Date: 07/12/2022  END OF SESSION:  PT End of Session - 07/12/22 1018     Visit Number 4    Number of Visits 16    Date for PT Re-Evaluation 08/17/22    Authorization Type [employer]    PT Start Time 1020    PT Stop Time 1100    PT Time Calculation (min) 40 min    Activity Tolerance Patient tolerated treatment well    Behavior During Therapy WFL for tasks assessed/performed              Past Medical History:  Diagnosis Date   Arthritis    on meds   Bleeding ulcer    Cataract    recently dx-bilaterally   Elevated cholesterol    on meds   GERD (gastroesophageal reflux disease)    on meds   Hiatal hernia    High blood pressure    on meds   Kidney cysts    Liver cyst    Nerve pain    L sciatic pain radiating to R   Osteoporosis    reaction to Prolia=   Peptic ulcer disease    Seasonal allergies    Past Surgical History:  Procedure Laterality Date   APPENDECTOMY  1992   BREAST CYST ASPIRATION Left 2003   COLONOSCOPY  2014   [facility]   KNEE ARTHROSCOPY Right 04/12/2001   torn cartilage   LAPAROSCOPY  1981   for endometriosis   LEG SURGERY Right 11/23/2000   to remove screws   LEG SURGERY Right 02/08/2002   to remove rod   MENISCUS REPAIR  01/29/2003   TIBIA FRACTURE SURGERY Right 06/12/2000   Multiple surgeries   TONSILLECTOMY  1967   TOTAL ABDOMINAL HYSTERECTOMY  1992   WISDOM TOOTH EXTRACTION     Patient Active Problem List   Diagnosis Date Noted   Back pain 11/27/2020   Osteopenia 11/27/2020   Sciatica 11/27/2020   Surgical menopause 11/27/2020   Tibia/fibula fracture 11/27/2020   Unilateral primary osteoarthritis, right knee 09/05/2019   Hyperlipidemia 08/15/2019   Essential hypertension 08/13/2019   GERD (gastroesophageal reflux disease) 08/13/2019   Osteoporosis 08/13/2019    Post-traumatic osteoarthritis of right knee 08/13/2019   Hiatal hernia 08/13/2019   Chronic left-sided low back pain without sciatica 08/13/2019    PCP: Yetta Flock Allwardt  REFERRING PROVIDER: Yetta Flock Allwardt  REFERRING DIAG: Alyssa Allwardt  THERAPY DIAG:  Other low back pain  Chronic pain of right knee  Rationale for Evaluation and Treatment: Rehabilitation  ONSET DATE:   SUBJECTIVE:   SUBJECTIVE STATEMENT:  Pt states minimal soreness today.   Eval: Patient states ongoing back pain.  She thinks problems started about 22 years ago when she broke her right leg.  She ended up having about 5 surgeries, and reports back pain after that.  She thinks that her right leg is weak.  Notes weakness with initial standing.  Also has been to back doctor and states that she is a candidate for surgery for her stenosis . She has the most pain in standing even after just a few minutes., and also with prolonged sitting. Pain on L >right.  Pain down and into her left glute.  CT scan from ER, shows old compression fx at L1. States she has had a Comparison x-ray but I do not have this result yet.  R knee still painful. -  is a candidate for knee replacement. Works 4 part time jobs.    She has not been consistent with exercise or treatment for back pain.    PERTINENT HISTORY: R knee pain, Previous tibia fracture, with complications and multiple surgeries.   PAIN:  Are you having pain? Yes: NPRS scale: 5-6/10 Pain location: back  Pain description: sore Aggravating factors: Standing, walking Relieving factors: Changing positions  Are you having pain? Yes: NPRS scale: 5-6/10 Pain location: R knee  Pain description: Sore Aggravating factors: Increased activity Relieving factors: Rest     PRECAUTIONS: None  WEIGHT BEARING RESTRICTIONS: No  FALLS:  Has patient fallen in last 6 months? Yes. Number of falls 1 Pt tripped over a bag, she stumbled. No other falls.    PLOF:  Independent  PATIENT GOALS: Decrease pain and back,  hopefully to avoid surgery   OBJECTIVE:   DIAGNOSTIC FINDINGS:    COGNITION: Overall cognitive status: Within functional limits for tasks assessed     POSTURE: Valgus at right knee  PALPATION: Tenderness in left SI with pain down into left glutes muscles.  Hypomobile right knee-lacking full extension   LOWER EXTREMITY ROM: Lumbar: flexion: Mild limitation:   Extension: not tested (possible compression Fx),   Active ROM Right eval Left eval  Hip flexion wfl wfl  Hip extension    Hip abduction    Hip adduction    Hip internal rotation    Hip external rotation Mild limitation Mild limitation  Knee flexion 125 wnl  Knee extension -13 wnl  Ankle dorsiflexion    Ankle plantarflexion    Ankle inversion    Ankle eversion     (Blank rows = not tested)  LOWER EXTREMITY MMT:  L hip flexion: 4-/5,  Abd: 4/5.  Knees: 4+/5   LOWER EXTREMITY SPECIAL TESTS:  Neg SLR,     GAIT:    TODAY'S TREATMENT:                                                                                                                              DATE:    07/12/22: Therapeutic Exercise: Aerobic:  Supine:   Quad sets x 20 on R;  SLR 2 x 10 bil;      S/L hip abd x 15 bil;  Seated:  LAQ 3lb x 20 on R;  Sit to stand x 10 no UE support,   Standing: Hip abd 2 x 10 bil;  Standing march x 20 no UE support;  Step ups 4 in x 10 bil; Stairs, up/down with 1 UE support, and education on use of cane- step over step on way up, and step to on the way down x 5;  Stretches:  Neuromuscular Re-education: Manual Therapy: PROm and joint mobs to increase R knee extension;      07/06/22: Therapeutic Exercise: Aerobic:  Supine:   Quad sets x 20 on R;  SLR 2 x 10 bil;  Seated:  Sit to stand x 10 no UE support, cueing for fwd lean.  Standing: Hip abd 2 x 10 bil;  Standing march x 20 no UE support;   Stretches: SKTC 30 sec x 3 bil;   Neuromuscular  Re-education: Manual Therapy: PROm and joint mobs to increase R knee extension;  STM/TPR to L glute and low lumbar    06/28/22: Therapeutic Exercise: Aerobic:  Supine:   Quad sets x 20 on R;  SLR 2 x 10 bil;      Seated:  Sit to stand x 10 with education on knee positioning;   Standing: Ambulation with SPC, with education on sequencing and pressure relief for R knee.   Hip abd 2 x 10 bil;  Standing march x 20;  Stretches: SKTC 30 sec x 3 bil;   Supine and seated Piriformis x 3 bil;  Neuromuscular Re-education: Manual Therapy: PROm and joint mobs to increase R knee extension;  Self Care:    PATIENT EDUCATION:  Education details: Reviewed HEP Person educated: Patient Education method: Explanation, Demonstration, Tactile cues, Verbal cues, and Handouts Education comprehension: verbalized understanding, returned demonstration, verbal cues required, tactile cues required, and needs further education   HOME EXERCISE PROGRAM: Access Code: [email or web address]   ASSESSMENT:  CLINICAL IMPRESSION:  Pt challenged with strengthening for LEs, with fatigue at end of sets performed. Encouraged continued HEP for hip and quad strength. Review for safety and cane use on the stairs today. Will benefit from progressive strength, as well as manual and tension relief for low back and hip pain.    Eval: Patient presents with primary complaint of pain in back. She has increased pain with standing and walking, and has difficulty sustaining activity. She also has significant pain and deficit in R knee, that are causing some gait and movement deficits, which could also be contributing to back pain. She has decreased quality of movement, and will benefit from strengthening for knee and hips as well as for mobility and core strength for back. Pt to benefit from skilled PT to improve deficits, pain and ability for functional activity.    OBJECTIVE IMPAIRMENTS: Abnormal gait, decreased activity tolerance, decreased  balance, decreased endurance, decreased knowledge of use of DME, decreased mobility, difficulty walking, decreased ROM, decreased strength, decreased safety awareness, hypomobility, increased muscle spasms, impaired flexibility, improper body mechanics, and pain.   ACTIVITY LIMITATIONS: lifting, bending, sitting, standing, squatting, stairs, transfers, and locomotion level  PARTICIPATION LIMITATIONS: meal prep, cleaning, laundry, shopping, community activity, and occupation  PERSONAL FACTORS: Time since onset of injury/illness/exacerbation are also affecting patient's functional outcome.   REHAB POTENTIAL: Good  CLINICAL DECISION MAKING: Stable/uncomplicated  EVALUATION COMPLEXITY: Low   GOALS: Goals reviewed with patient? Yes  SHORT TERM GOALS: Target date: 07/07/2022   Patient to be independent with initial HEP  Goal status: INITIAL  2.  Patient to demonstrate optimal mechanics and use of single-point cane for offloading of right knee  Goal status: INITIAL    LONG TERM GOALS: Target date: 08/18/2022  Patient to be independent with final HEP  Goal status: INITIAL  2.  Patient to report soft tissue limitations in left Glute to be WNL to improve pain   Goal status: INITIAL  3.  Patient to demonstrate range of motion for hips and lumbar spine to be within normal limits to improve ability for ADLs and IADLs  Goal status: INITIAL  4.  Patient to demonstrate optimal mechanics with bed and lift squat positions to improve pain with  ADLs  Goal status: INITIAL  5.  Patient to report pain in back and hip 0-3/10 with standing and walking activity up to 30 min.    Goal status: INITIAL     PLAN:  PT FREQUENCY: 1-2x/week  PT DURATION: 8 weeks  PLANNED INTERVENTIONS: Therapeutic exercises, Therapeutic activity, Neuromuscular re-education, Balance training, Gait training, Patient/Family education, Self Care, Joint mobilization, Joint manipulation, Stair training,  Orthotic/Fit training, DME instructions, Aquatic Therapy, Dry Needling, Electrical stimulation, Spinal manipulation, Spinal mobilization, Cryotherapy, Moist heat, Taping, Vasopneumatic device, Traction, Ultrasound, Ionotophoresis '4mg'$ /ml Dexamethasone, and Manual therapy  PLAN FOR NEXT SESSION:    Lyndee Hensen, PT, DPT 10:18 AM  07/12/22

## 2022-07-15 ENCOUNTER — Encounter: Payer: Self-pay | Admitting: *Deleted

## 2022-07-19 ENCOUNTER — Encounter: Payer: Self-pay | Admitting: Physical Therapy

## 2022-07-19 ENCOUNTER — Ambulatory Visit: Payer: Medicare HMO | Admitting: Physical Therapy

## 2022-07-19 DIAGNOSIS — G8929 Other chronic pain: Secondary | ICD-10-CM

## 2022-07-19 DIAGNOSIS — M5459 Other low back pain: Secondary | ICD-10-CM

## 2022-07-19 DIAGNOSIS — M25561 Pain in right knee: Secondary | ICD-10-CM | POA: Diagnosis not present

## 2022-07-19 NOTE — Therapy (Signed)
OUTPATIENT PHYSICAL THERAPY LOWER EXTREMITY TREATMENT   Patient Name: Tracy Mckenzie MRN: [id number] DOB:20-May-1956, 66 y.o., female Today's Date: 07/12/2022  END OF SESSION:     Past Medical History:  Diagnosis Date   Arthritis    on meds   Bleeding ulcer    Cataract    recently dx-bilaterally   Elevated cholesterol    on meds   GERD (gastroesophageal reflux disease)    on meds   Hiatal hernia    High blood pressure    on meds   Kidney cysts    Liver cyst    Nerve pain    L sciatic pain radiating to R   Osteoporosis    reaction to Prolia=   Peptic ulcer disease    Seasonal allergies    Past Surgical History:  Procedure Laterality Date   APPENDECTOMY  1992   BREAST CYST ASPIRATION Left 2003   COLONOSCOPY  2014   [facility]   KNEE ARTHROSCOPY Right 04/12/2001   torn cartilage   LAPAROSCOPY  1981   for endometriosis   LEG SURGERY Right 11/23/2000   to remove screws   LEG SURGERY Right 02/08/2002   to remove rod   MENISCUS REPAIR  01/29/2003   TIBIA FRACTURE SURGERY Right 06/12/2000   Multiple surgeries   [place]   WISDOM TOOTH EXTRACTION     Patient Active Problem List   Diagnosis Date Noted   Back pain 11/27/2020   Osteopenia 11/27/2020   Sciatica 11/27/2020   Surgical menopause 11/27/2020   Tibia/fibula fracture 11/27/2020   Unilateral primary osteoarthritis, right knee 09/05/2019   Hyperlipidemia 08/15/2019   Essential hypertension 08/13/2019   GERD (gastroesophageal reflux disease) 08/13/2019   Osteoporosis 08/13/2019   Post-traumatic osteoarthritis of right knee 08/13/2019   Hiatal hernia 08/13/2019   Chronic left-sided low back pain without sciatica 08/13/2019    PCP: Yetta Flock Allwardt  REFERRING PROVIDER: Yetta Flock Allwardt  REFERRING DIAG: Alyssa Allwardt  THERAPY DIAG:  No diagnosis found.  Rationale for Evaluation and Treatment: Rehabilitation  ONSET DATE:   SUBJECTIVE:    SUBJECTIVE STATEMENT:  Pt states minimal soreness today. Still has a hard time standing for more than a few minutes, due to back pain.   Eval: Patient states ongoing back pain.  She thinks problems started about 22 years ago when she broke her right leg.  She ended up having about 5 surgeries, and reports back pain after that.  She thinks that her right leg is weak.  Notes weakness with initial standing.  Also has been to back doctor and states that she is a candidate for surgery for her stenosis . She has the most pain in standing even after just a few minutes., and also with prolonged sitting. Pain on L >right.  Pain down and into her left glute.  CT scan from ER, shows old compression fx at L1. States she has had a Comparison x-ray but I do not have this result yet.  R knee still painful. - is a candidate for knee replacement. Works 4 part time jobs.    She has not been consistent with exercise or treatment for back pain.    PERTINENT HISTORY: R knee pain, Previous tibia fracture, with complications and multiple surgeries.   PAIN:  Are you having pain? Yes: NPRS scale: 5-6/10 Pain location: back  Pain description: sore Aggravating factors: Standing, walking Relieving factors: Changing positions  Are you having pain? Yes: NPRS scale: 5-6/10  Pain location: R knee  Pain description: Sore Aggravating factors: Increased activity Relieving factors: Rest     PRECAUTIONS: None  WEIGHT BEARING RESTRICTIONS: No  FALLS:  Has patient fallen in last 6 months? Yes. Number of falls 1 Pt tripped over a bag, she stumbled. No other falls.    PLOF: Independent  PATIENT GOALS: Decrease pain and back,  hopefully to avoid surgery   OBJECTIVE:   DIAGNOSTIC FINDINGS:    COGNITION: Overall cognitive status: Within functional limits for tasks assessed     POSTURE: Valgus at right knee  PALPATION: Tenderness in left SI with pain down into left glutes muscles.  Hypomobile right  knee-lacking full extension   LOWER EXTREMITY ROM: Lumbar: flexion: Mild limitation:   Extension: not tested (possible compression Fx),   Active ROM Right eval Left eval  Hip flexion wfl wfl  Hip extension    Hip abduction    Hip adduction    Hip internal rotation    Hip external rotation Mild limitation Mild limitation  Knee flexion 125 wnl  Knee extension -13 wnl  Ankle dorsiflexion    Ankle plantarflexion    Ankle inversion    Ankle eversion     (Blank rows = not tested)  LOWER EXTREMITY MMT:  L hip flexion: 4-/5,  Abd: 4/5.  Knees: 4+/5   LOWER EXTREMITY SPECIAL TESTS:  Neg SLR,     GAIT:    TODAY'S TREATMENT:                                                                                                                              DATE:    07/19/22: Therapeutic Exercise: Aerobic:  Supine:   Quad sets x 20 on R;  SLR 2 x 10 bil;      S/L hip abd x 15 bil;  Seated:  LAQ 3lb x 20 bil;    Standing: Hip abd 2 x 10 bil;  Standing march x 20 no UE support;   Stretches:  Neuromuscular Re-education: Manual Therapy: TPR to L low lumbar musculature and SI.     07/12/22: Therapeutic Exercise: Aerobic:  Supine:   Quad sets x 20 on R;  SLR 2 x 10 bil;      S/L hip abd x 15 bil;  Seated:  LAQ 3lb x 20 bil;  Sit to stand x 10 no UE support,   Standing: Hip abd 2 x 10 bil;  Standing march x 20 no UE support;  Step ups 4 in x 10 bil;  Stairs, up/down with 1 UE support, and education on use of cane- step over step on way up, and step to on the way down x 5;  Stretches:  Neuromuscular Re-education: Manual Therapy: PROm and joint mobs to increase R knee extension;        PATIENT EDUCATION:  Education details: Reviewed HEP Person educated: Patient Education method: Explanation, Demonstration, Tactile cues, Verbal cues, and Handouts Education comprehension: verbalized  understanding, returned demonstration, verbal cues required, tactile cues required, and needs  further education   HOME EXERCISE PROGRAM: Access Code: [email or web address]   ASSESSMENT:  CLINICAL IMPRESSION: Pt challenged with strengthening. Requires encouragement for importance of HEP and continuing to strengthen hips and LEs. Discussed trying to do more stretches and position changes throughout the day for back pain. Pt to benefit from continued care.   Pt challenged with strengthening for LEs, with fatigue at end of sets performed. Encouraged continued HEP for hip and quad strength. Review for safety and cane use on the stairs today. Will benefit from progressive strength, as well as manual and tension relief for low back and hip pain.    Eval: Patient presents with primary complaint of pain in back. She has increased pain with standing and walking, and has difficulty sustaining activity. She also has significant pain and deficit in R knee, that are causing some gait and movement deficits, which could also be contributing to back pain. She has decreased quality of movement, and will benefit from strengthening for knee and hips as well as for mobility and core strength for back. Pt to benefit from skilled PT to improve deficits, pain and ability for functional activity.    OBJECTIVE IMPAIRMENTS: Abnormal gait, decreased activity tolerance, decreased balance, decreased endurance, decreased knowledge of use of DME, decreased mobility, difficulty walking, decreased ROM, decreased strength, decreased safety awareness, hypomobility, increased muscle spasms, impaired flexibility, improper body mechanics, and pain.   ACTIVITY LIMITATIONS: lifting, bending, sitting, standing, squatting, stairs, transfers, and locomotion level  PARTICIPATION LIMITATIONS: meal prep, cleaning, laundry, shopping, community activity, and occupation  PERSONAL FACTORS: Time since onset of injury/illness/exacerbation are also affecting patient's functional outcome.   REHAB POTENTIAL: Good  CLINICAL DECISION MAKING:  Stable/uncomplicated  EVALUATION COMPLEXITY: Low   GOALS: Goals reviewed with patient? Yes  SHORT TERM GOALS: Target date: 07/07/2022   Patient to be independent with initial HEP  Goal status: INITIAL  2.  Patient to demonstrate optimal mechanics and use of single-point cane for offloading of right knee  Goal status: INITIAL    LONG TERM GOALS: Target date: 08/18/2022  Patient to be independent with final HEP  Goal status: INITIAL  2.  Patient to report soft tissue limitations in left Glute to be WNL to improve pain   Goal status: INITIAL  3.  Patient to demonstrate range of motion for hips and lumbar spine to be within normal limits to improve ability for ADLs and IADLs  Goal status: INITIAL  4.  Patient to demonstrate optimal mechanics with bed and lift squat positions to improve pain with ADLs  Goal status: INITIAL  5.  Patient to report pain in back and hip 0-3/10 with standing and walking activity up to 30 min.    Goal status: INITIAL     PLAN:  PT FREQUENCY: 1-2x/week  PT DURATION: 8 weeks  PLANNED INTERVENTIONS: Therapeutic exercises, Therapeutic activity, Neuromuscular re-education, Balance training, Gait training, Patient/Family education, Self Care, Joint mobilization, Joint manipulation, Stair training, Orthotic/Fit training, DME instructions, Aquatic Therapy, Dry Needling, Electrical stimulation, Spinal manipulation, Spinal mobilization, Cryotherapy, Moist heat, Taping, Vasopneumatic device, Traction, Ultrasound, Ionotophoresis '4mg'$ /ml Dexamethasone, and Manual therapy  PLAN FOR NEXT SESSION:    Lyndee Hensen, PT, DPT 10:18 AM  07/19/22

## 2022-07-21 ENCOUNTER — Encounter: Payer: Self-pay | Admitting: Physician Assistant

## 2022-07-23 ENCOUNTER — Other Ambulatory Visit: Payer: Self-pay | Admitting: Physician Assistant

## 2022-07-29 ENCOUNTER — Encounter: Payer: Medicare HMO | Admitting: Physical Therapy

## 2022-08-04 ENCOUNTER — Encounter: Payer: Self-pay | Admitting: Physical Therapy

## 2022-08-04 ENCOUNTER — Ambulatory Visit: Payer: Medicare HMO | Admitting: Physical Therapy

## 2022-08-04 DIAGNOSIS — M5459 Other low back pain: Secondary | ICD-10-CM

## 2022-08-04 DIAGNOSIS — G8929 Other chronic pain: Secondary | ICD-10-CM | POA: Diagnosis not present

## 2022-08-04 DIAGNOSIS — M25561 Pain in right knee: Secondary | ICD-10-CM | POA: Diagnosis not present

## 2022-08-04 NOTE — Therapy (Unsigned)
OUTPATIENT PHYSICAL THERAPY LOWER EXTREMITY TREATMENT   Patient Name: Brande Uncapher MRN: [id number] DOB:1956/06/22, 67 y.o., female Today's Date: 08/04/22  END OF SESSION:  PT End of Session - 08/04/22 1015     Visit Number 6    Number of Visits 16    Date for PT Re-Evaluation 08/17/22    Authorization Type [employer]    PT Start Time 1017    PT Stop Time 1100    PT Time Calculation (min) 43 min    Activity Tolerance Patient tolerated treatment well    Behavior During Therapy WFL for tasks assessed/performed               Past Medical History:  Diagnosis Date   Arthritis    on meds   Bleeding ulcer    Cataract    recently dx-bilaterally   Elevated cholesterol    on meds   GERD (gastroesophageal reflux disease)    on meds   Hiatal hernia    High blood pressure    on meds   Kidney cysts    Liver cyst    Nerve pain    L sciatic pain radiating to R   Osteoporosis    reaction to Prolia=   Peptic ulcer disease    Seasonal allergies    Past Surgical History:  Procedure Laterality Date   APPENDECTOMY  1992   BREAST CYST ASPIRATION Left 2003   COLONOSCOPY  2014   [facility]   KNEE ARTHROSCOPY Right 04/12/2001   torn cartilage   LAPAROSCOPY  1981   for endometriosis   LEG SURGERY Right 11/23/2000   to remove screws   LEG SURGERY Right 02/08/2002   to remove rod   MENISCUS REPAIR  01/29/2003   TIBIA FRACTURE SURGERY Right 06/12/2000   Multiple surgeries   [place]   WISDOM TOOTH EXTRACTION     Patient Active Problem List   Diagnosis Date Noted   Back pain 11/27/2020   Osteopenia 11/27/2020   Sciatica 11/27/2020   Surgical menopause 11/27/2020   Tibia/fibula fracture 11/27/2020   Unilateral primary osteoarthritis, right knee 09/05/2019   Hyperlipidemia 08/15/2019   Essential hypertension 08/13/2019   GERD (gastroesophageal reflux disease) 08/13/2019   Osteoporosis 08/13/2019    Post-traumatic osteoarthritis of right knee 08/13/2019   Hiatal hernia 08/13/2019   Chronic left-sided low back pain without sciatica 08/13/2019    PCP: Yetta Flock Allwardt  REFERRING PROVIDER: Yetta Flock Allwardt  REFERRING DIAG: Alyssa Allwardt  THERAPY DIAG:  Other low back pain  Chronic pain of right knee  Rationale for Evaluation and Treatment: Rehabilitation  ONSET DATE:   SUBJECTIVE:   SUBJECTIVE STATEMENT:  Pt with increased soreness in L low back/SI in last few days. Has had significant pain and difficulty sleeping. She is still contemplating back surgery.   Eval: Patient states ongoing back pain.  She thinks problems started about 22 years ago when she broke her right leg.  She ended up having about 5 surgeries, and reports back pain after that.  She thinks that her right leg is weak.  Notes weakness with initial standing.  Also has been to back doctor and states that she is a candidate for surgery for her stenosis . She has the most pain in standing even after just a few minutes., and also with prolonged sitting. Pain on L >right.  Pain down and into her left glute.  CT scan from ER, shows old compression fx at  L1. States she has had a Comparison x-ray but I do not have this result yet.  R knee still painful. - is a candidate for knee replacement. Works 4 part time jobs.    She has not been consistent with exercise or treatment for back pain.    PERTINENT HISTORY: R knee pain, Previous tibia fracture, with complications and multiple surgeries.   PAIN:  Are you having pain? Yes: NPRS scale: 5-6/10 Pain location: back  Pain description: sore Aggravating factors: Standing, walking Relieving factors: Changing positions  Are you having pain? Yes: NPRS scale: 5-6/10 Pain location: R knee  Pain description: Sore Aggravating factors: Increased activity Relieving factors: Rest     PRECAUTIONS: None  WEIGHT BEARING RESTRICTIONS: No  FALLS:  Has patient fallen in last  6 months? Yes. Number of falls 1 Pt tripped over a bag, she stumbled. No other falls.    PLOF: Independent  PATIENT GOALS: Decrease pain and back,  hopefully to avoid surgery   OBJECTIVE:   DIAGNOSTIC FINDINGS:    COGNITION: Overall cognitive status: Within functional limits for tasks assessed     POSTURE: Valgus at right knee  PALPATION: Tenderness in left SI with pain down into left glutes muscles.  Hypomobile right knee-lacking full extension   LOWER EXTREMITY ROM: Lumbar: flexion: Mild limitation:   Extension: not tested (possible compression Fx),   Active ROM Right eval Left eval  Hip flexion wfl wfl  Hip extension    Hip abduction    Hip adduction    Hip internal rotation    Hip external rotation Mild limitation Mild limitation  Knee flexion 125 wnl  Knee extension -13 wnl  Ankle dorsiflexion    Ankle plantarflexion    Ankle inversion    Ankle eversion     (Blank rows = not tested)  LOWER EXTREMITY MMT:  L hip flexion: 4-/5,  Abd: 4/5.  Knees: 4+/5   LOWER EXTREMITY SPECIAL TESTS:  Neg SLR,     GAIT:    TODAY'S TREATMENT:                                                                                                                              DATE:    08/04/21: Therapeutic Exercise: Aerobic:  Supine:   Quad sets x 20 on R;  SLR 2 x 10 bil;      S/L hip abd x 15 bil;  Seated:  LAQ 3lb x 20 bil;    Standing: Hip abd 2 x 10 bil;  Standing march x 20 no UE support;   Stretches:  Neuromuscular Re-education: Manual Therapy: TPR to L low lumbar musculature and SI.     07/19/22: Therapeutic Exercise: Aerobic:  Supine:   Quad sets x 20 on R;  SLR 2 x 10 bil;      S/L hip abd x 15 bil;  Seated:  LAQ 3lb x 20 bil;    Standing: Hip abd 2 x  10 bil;  Standing march x 20 no UE support;   Stretches:  Neuromuscular Re-education: Manual Therapy: TPR to L low lumbar musculature and SI.     07/12/22: Therapeutic Exercise: Aerobic:  Supine:   Quad  sets x 20 on R;  SLR 2 x 10 bil;      S/L hip abd x 15 bil;  Seated:  LAQ 3lb x 20 bil;  Sit to stand x 10 no UE support,   Standing: Hip abd 2 x 10 bil;  Standing march x 20 no UE support;  Step ups 4 in x 10 bil;  Stairs, up/down with 1 UE support, and education on use of cane- step over step on way up, and step to on the way down x 5;  Stretches:  Neuromuscular Re-education: Manual Therapy: PROm and joint mobs to increase R knee extension;       PATIENT EDUCATION:  Education details: Reviewed HEP Person educated: Patient Education method: Explanation, Demonstration, Tactile cues, Verbal cues, and Handouts Education comprehension: verbalized understanding, returned demonstration, verbal cues required, tactile cues required, and needs further education   HOME EXERCISE PROGRAM: Access Code: [email or web address]   ASSESSMENT:  CLINICAL IMPRESSION: Pt challenged with strengthening. Requires encouragement for importance of HEP and continuing to strengthen hips and LEs. Discussed trying to do more stretches and position changes throughout the day for back pain. Pt to benefit from continued care.   Pt challenged with strengthening for LEs, with fatigue at end of sets performed. Encouraged continued HEP for hip and quad strength. Review for safety and cane use on the stairs today. Will benefit from progressive strength, as well as manual and tension relief for low back and hip pain.    Eval: Patient presents with primary complaint of pain in back. She has increased pain with standing and walking, and has difficulty sustaining activity. She also has significant pain and deficit in R knee, that are causing some gait and movement deficits, which could also be contributing to back pain. She has decreased quality of movement, and will benefit from strengthening for knee and hips as well as for mobility and core strength for back. Pt to benefit from skilled PT to improve deficits, pain and ability for  functional activity.    OBJECTIVE IMPAIRMENTS: Abnormal gait, decreased activity tolerance, decreased balance, decreased endurance, decreased knowledge of use of DME, decreased mobility, difficulty walking, decreased ROM, decreased strength, decreased safety awareness, hypomobility, increased muscle spasms, impaired flexibility, improper body mechanics, and pain.   ACTIVITY LIMITATIONS: lifting, bending, sitting, standing, squatting, stairs, transfers, and locomotion level  PARTICIPATION LIMITATIONS: meal prep, cleaning, laundry, shopping, community activity, and occupation  PERSONAL FACTORS: Time since onset of injury/illness/exacerbation are also affecting patient's functional outcome.   REHAB POTENTIAL: Good  CLINICAL DECISION MAKING: Stable/uncomplicated  EVALUATION COMPLEXITY: Low   GOALS: Goals reviewed with patient? Yes  SHORT TERM GOALS: Target date: 07/07/2022   Patient to be independent with initial HEP  Goal status: INITIAL  2.  Patient to demonstrate optimal mechanics and use of single-point cane for offloading of right knee  Goal status: INITIAL    LONG TERM GOALS: Target date: 08/18/2022  Patient to be independent with final HEP  Goal status: INITIAL  2.  Patient to report soft tissue limitations in left Glute to be WNL to improve pain   Goal status: INITIAL  3.  Patient to demonstrate range of motion for hips and lumbar spine to be within normal limits to improve ability for ADLs  and IADLs  Goal status: INITIAL  4.  Patient to demonstrate optimal mechanics with bed and lift squat positions to improve pain with ADLs  Goal status: INITIAL  5.  Patient to report pain in back and hip 0-3/10 with standing and walking activity up to 30 min.    Goal status: INITIAL     PLAN:  PT FREQUENCY: 1-2x/week  PT DURATION: 8 weeks  PLANNED INTERVENTIONS: Therapeutic exercises, Therapeutic activity, Neuromuscular re-education, Balance training, Gait  training, Patient/Family education, Self Care, Joint mobilization, Joint manipulation, Stair training, Orthotic/Fit training, DME instructions, Aquatic Therapy, Dry Needling, Electrical stimulation, Spinal manipulation, Spinal mobilization, Cryotherapy, Moist heat, Taping, Vasopneumatic device, Traction, Ultrasound, Ionotophoresis '4mg'$ /ml Dexamethasone, and Manual therapy  PLAN FOR NEXT SESSION:    Lyndee Hensen, PT, DPT 12:08 PM  08/04/22

## 2022-08-05 ENCOUNTER — Encounter: Payer: Self-pay | Admitting: Physical Therapy

## 2022-08-11 ENCOUNTER — Ambulatory Visit: Payer: Medicare HMO | Admitting: Physical Therapy

## 2022-08-11 ENCOUNTER — Encounter: Payer: Self-pay | Admitting: Physical Therapy

## 2022-08-11 DIAGNOSIS — G8929 Other chronic pain: Secondary | ICD-10-CM | POA: Diagnosis not present

## 2022-08-11 DIAGNOSIS — M25561 Pain in right knee: Secondary | ICD-10-CM | POA: Diagnosis not present

## 2022-08-11 DIAGNOSIS — M5459 Other low back pain: Secondary | ICD-10-CM

## 2022-08-11 NOTE — Therapy (Signed)
OUTPATIENT PHYSICAL THERAPY LOWER EXTREMITY TREATMENT   Patient Name: Tracy Mckenzie MRN: [id number] DOB:Nov 26, 1955, 66 y.o., female Today's Date: 08/11/22  END OF SESSION:  PT End of Session - 08/11/22 1409     Visit Number 7    Number of Visits 16    Date for PT Re-Evaluation 08/17/22    Authorization Type [employer]    PT Start Time 4734    PT Stop Time 1300    PT Time Calculation (min) 40 min    Activity Tolerance Patient tolerated treatment well    Behavior During Therapy WFL for tasks assessed/performed               Past Medical History:  Diagnosis Date   Arthritis    on meds   Bleeding ulcer    Cataract    recently dx-bilaterally   Elevated cholesterol    on meds   GERD (gastroesophageal reflux disease)    on meds   Hiatal hernia    High blood pressure    on meds   Kidney cysts    Liver cyst    Nerve pain    L sciatic pain radiating to R   Osteoporosis    reaction to Prolia=   Peptic ulcer disease    Seasonal allergies    Past Surgical History:  Procedure Laterality Date   APPENDECTOMY  1992   BREAST CYST ASPIRATION Left 2003   COLONOSCOPY  2014   [facility]   KNEE ARTHROSCOPY Right 04/12/2001   torn cartilage   LAPAROSCOPY  1981   for endometriosis   LEG SURGERY Right 11/23/2000   to remove screws   LEG SURGERY Right 02/08/2002   to remove rod   MENISCUS REPAIR  01/29/2003   TIBIA FRACTURE SURGERY Right 06/12/2000   Multiple surgeries   TONSILLECTOMY  1967   TOTAL ABDOMINAL HYSTERECTOMY  1992   WISDOM TOOTH EXTRACTION     Patient Active Problem List   Diagnosis Date Noted   Back pain 11/27/2020   Osteopenia 11/27/2020   Sciatica 11/27/2020   Surgical menopause 11/27/2020   Tibia/fibula fracture 11/27/2020   Unilateral primary osteoarthritis, right knee 09/05/2019   Hyperlipidemia 08/15/2019   Essential hypertension 08/13/2019   GERD (gastroesophageal reflux disease) 08/13/2019   Osteoporosis 08/13/2019    Post-traumatic osteoarthritis of right knee 08/13/2019   Hiatal hernia 08/13/2019   Chronic left-sided low back pain without sciatica 08/13/2019    PCP: Yetta Flock Allwardt  REFERRING PROVIDER: Yetta Flock Allwardt  REFERRING DIAG: Alyssa Allwardt  THERAPY DIAG:  Other low back pain  Chronic pain of right knee  Rationale for Evaluation and Treatment: Rehabilitation  ONSET DATE:   SUBJECTIVE:   SUBJECTIVE STATEMENT:  Pt with no new complaints. Still has significant pain with standing for a few min.   Eval: Patient states ongoing back pain.  She thinks problems started about 22 years ago when she broke her right leg.  She ended up having about 5 surgeries, and reports back pain after that.  She thinks that her right leg is weak.  Notes weakness with initial standing.  Also has been to back doctor and states that she is a candidate for surgery for her stenosis . She has the most pain in standing even after just a few minutes., and also with prolonged sitting. Pain on L >right.  Pain down and into her left glute.  CT scan from ER, shows old compression fx at L1. States she has had a Comparison x-ray but I  do not have this result yet.  R knee still painful. - is a candidate for knee replacement. Works 4 part time jobs.    She has not been consistent with exercise or treatment for back pain.    PERTINENT HISTORY: R knee pain, Previous tibia fracture, with complications and multiple surgeries.   PAIN:  Are you having pain? Yes: NPRS scale: 5-6/10 Pain location: back  Pain description: sore Aggravating factors: Standing, walking Relieving factors: Changing positions  Are you having pain? Yes: NPRS scale: 5-6/10 Pain location: R knee  Pain description: Sore Aggravating factors: Increased activity Relieving factors: Rest     PRECAUTIONS: None  WEIGHT BEARING RESTRICTIONS: No  FALLS:  Has patient fallen in last 6 months? Yes. Number of falls 1 Pt tripped over a bag, she  stumbled. No other falls.    PLOF: Independent  PATIENT GOALS: Decrease pain and back,  hopefully to avoid surgery   OBJECTIVE:   DIAGNOSTIC FINDINGS:    COGNITION: Overall cognitive status: Within functional limits for tasks assessed     POSTURE: Valgus at right knee  PALPATION: Tenderness in left SI with pain down into left glutes muscles.  Hypomobile right knee-lacking full extension   LOWER EXTREMITY ROM: Lumbar: flexion: Mild limitation:   Extension: not tested (possible compression Fx),   Active ROM Right eval Left eval  Hip flexion wfl wfl  Hip extension    Hip abduction    Hip adduction    Hip internal rotation    Hip external rotation Mild limitation Mild limitation  Knee flexion 125 wnl  Knee extension -13 wnl  Ankle dorsiflexion    Ankle plantarflexion    Ankle inversion    Ankle eversion     (Blank rows = not tested)  LOWER EXTREMITY MMT:  L hip flexion: 4-/5,  Abd: 4/5.  Knees: 4+/5   LOWER EXTREMITY SPECIAL TESTS:  Neg SLR,     GAIT:    TODAY'S TREATMENT:                                                                                                                              DATE:     08/11/22: Therapeutic Exercise: Aerobic:   Supine:    SLR 2 x 10 bil;    Bridging x 10;   S/L hip abd x 15 bil;  clams 2 x 10;  Seated:  Pelvic tilts x 20;  sit to stand x 10- higher mat table,  Standing:   Standing march x 20 no UE support;  hip abd 3 x 5 bil; Gait with SPC 45 ft x 8, cuing for upright posture and shorter step length.  Stretches:  Standing QL stretch 30 sec x 2 on L;   Supine piriformis x 3 on L;  Neuromuscular Re-education: Manual Therapy: STM/tennis ball to L glute and L paraspinals.    08/04/21: Therapeutic Exercise: Aerobic:  Supine:   Quad sets x 20 on R;  SLR 2 x 10 bil;      S/L hip abd x 15 bil;  Seated:  LAQ 3lb x 20 bil;    Standing:   Standing march x 20 no UE support;  discussed standing/upright posture, to decrease  loading on L.    Stretches:  Standing QL stretch 30 sec x 2 on L;  LTR x 10; Supine piriformis x 3 on L;  Neuromuscular Re-education: Manual Therapy: STM/tennis ball to L glute    07/19/22: Therapeutic Exercise: Aerobic:  Supine:   Quad sets x 20 on R;  SLR 2 x 10 bil;      S/L hip abd x 15 bil;  Seated:  LAQ 3lb x 20 bil;    Standing: Hip abd 2 x 10 bil;  Standing march x 20 no UE support;   Stretches:  Neuromuscular Re-education: Manual Therapy: TPR to L low lumbar musculature and SI.     07/12/22: Therapeutic Exercise: Aerobic:  Supine:   Quad sets x 20 on R;  SLR 2 x 10 bil;      S/L hip abd x 15 bil;  Seated:  LAQ 3lb x 20 bil;  Sit to stand x 10 no UE support,   Standing: Hip abd 2 x 10 bil;  Standing march x 20 no UE support;  Step ups 4 in x 10 bil;  Stairs, up/down with 1 UE support, and education on use of cane- step over step on way up, and step to on the way down x 5;  Stretches:  Neuromuscular Re-education: Manual Therapy: PROm and joint mobs to increase R knee extension;       PATIENT EDUCATION:  Education details: Reviewed HEP Person educated: Patient Education method: Explanation, Demonstration, Tactile cues, Verbal cues, and Handouts Education comprehension: verbalized understanding, returned demonstration, verbal cues required, tactile cues required, and needs further education   HOME EXERCISE PROGRAM: Access Code: [email or web address]   ASSESSMENT:  CLINICAL IMPRESSION: Pt with continued soreness/tightness in L low lumbar and SI region. Still has some tension in L glute, but pain much better there. She has improved standing posture with gait and with activities today , from last visit. We discussed importance of continued work on strengthening for back and knee deficits.   Eval: Patient presents with primary complaint of pain in back. She has increased pain with standing and walking, and has difficulty sustaining activity. She also has significant pain and  deficit in R knee, that are causing some gait and movement deficits, which could also be contributing to back pain. She has decreased quality of movement, and will benefit from strengthening for knee and hips as well as for mobility and core strength for back. Pt to benefit from skilled PT to improve deficits, pain and ability for functional activity.    OBJECTIVE IMPAIRMENTS: Abnormal gait, decreased activity tolerance, decreased balance, decreased endurance, decreased knowledge of use of DME, decreased mobility, difficulty walking, decreased ROM, decreased strength, decreased safety awareness, hypomobility, increased muscle spasms, impaired flexibility, improper body mechanics, and pain.   ACTIVITY LIMITATIONS: lifting, bending, sitting, standing, squatting, stairs, transfers, and locomotion level  PARTICIPATION LIMITATIONS: meal prep, cleaning, laundry, shopping, community activity, and occupation  PERSONAL FACTORS: Time since onset of injury/illness/exacerbation are also affecting patient's functional outcome.   REHAB POTENTIAL: Good  CLINICAL DECISION MAKING: Stable/uncomplicated  EVALUATION COMPLEXITY: Low   GOALS: Goals reviewed with patient? Yes  SHORT TERM GOALS: Target date: 07/07/2022   Patient to be independent with initial HEP  Goal status:  INITIAL  2.  Patient to demonstrate optimal mechanics and use of single-point cane for offloading of right knee  Goal status: INITIAL    LONG TERM GOALS: Target date: 08/18/2022  Patient to be independent with final HEP  Goal status: INITIAL  2.  Patient to report soft tissue limitations in left Glute to be WNL to improve pain   Goal status: INITIAL  3.  Patient to demonstrate range of motion for hips and lumbar spine to be within normal limits to improve ability for ADLs and IADLs  Goal status: INITIAL  4.  Patient to demonstrate optimal mechanics with bed and lift squat positions to improve pain with ADLs  Goal  status: INITIAL  5.  Patient to report pain in back and hip 0-3/10 with standing and walking activity up to 30 min.    Goal status: INITIAL   PLAN:  PT FREQUENCY: 1-2x/week  PT DURATION: 8 weeks  PLANNED INTERVENTIONS: Therapeutic exercises, Therapeutic activity, Neuromuscular re-education, Balance training, Gait training, Patient/Family education, Self Care, Joint mobilization, Joint manipulation, Stair training, Orthotic/Fit training, DME instructions, Aquatic Therapy, Dry Needling, Electrical stimulation, Spinal manipulation, Spinal mobilization, Cryotherapy, Moist heat, Taping, Vasopneumatic device, Traction, Ultrasound, Ionotophoresis '4mg'$ /ml Dexamethasone, and Manual therapy  PLAN FOR NEXT SESSION:    Lyndee Hensen, PT, DPT 2:13 PM  08/11/22

## 2022-08-12 ENCOUNTER — Encounter: Payer: Self-pay | Admitting: Physician Assistant

## 2022-08-12 ENCOUNTER — Ambulatory Visit (INDEPENDENT_AMBULATORY_CARE_PROVIDER_SITE_OTHER): Payer: Medicare HMO | Admitting: Physician Assistant

## 2022-08-12 VITALS — BP 130/76 | HR 59 | Temp 97.5°F | Ht 66.0 in | Wt 179.8 lb

## 2022-08-12 DIAGNOSIS — R053 Chronic cough: Secondary | ICD-10-CM

## 2022-08-12 DIAGNOSIS — M810 Age-related osteoporosis without current pathological fracture: Secondary | ICD-10-CM

## 2022-08-12 DIAGNOSIS — E782 Mixed hyperlipidemia: Secondary | ICD-10-CM | POA: Diagnosis not present

## 2022-08-12 DIAGNOSIS — D72829 Elevated white blood cell count, unspecified: Secondary | ICD-10-CM | POA: Diagnosis not present

## 2022-08-12 DIAGNOSIS — G8929 Other chronic pain: Secondary | ICD-10-CM

## 2022-08-12 DIAGNOSIS — I10 Essential (primary) hypertension: Secondary | ICD-10-CM | POA: Diagnosis not present

## 2022-08-12 DIAGNOSIS — M545 Low back pain, unspecified: Secondary | ICD-10-CM | POA: Diagnosis not present

## 2022-08-12 LAB — CBC WITH DIFFERENTIAL/PLATELET
Basophils Absolute: 0.1 10*3/uL (ref 0.0–0.1)
Basophils Relative: 0.5 % (ref 0.0–3.0)
Eosinophils Absolute: 0.4 10*3/uL (ref 0.0–0.7)
Eosinophils Relative: 3.3 % (ref 0.0–5.0)
HCT: 40.9 % (ref 36.0–46.0)
Hemoglobin: 13.4 g/dL (ref 12.0–15.0)
Lymphocytes Relative: 22.9 % (ref 12.0–46.0)
Lymphs Abs: 2.7 10*3/uL (ref 0.7–4.0)
MCHC: 32.8 g/dL (ref 30.0–36.0)
MCV: 89.6 fl (ref 78.0–100.0)
Monocytes Absolute: 0.9 10*3/uL (ref 0.1–1.0)
Monocytes Relative: 7.5 % (ref 3.0–12.0)
Neutro Abs: 7.6 10*3/uL (ref 1.4–7.7)
Neutrophils Relative %: 65.8 % (ref 43.0–77.0)
Platelets: 379 10*3/uL (ref 150.0–400.0)
RBC: 4.57 Mil/uL (ref 3.87–5.11)
RDW: 14.6 % (ref 11.5–15.5)
WBC: 11.6 10*3/uL — ABNORMAL HIGH (ref 4.0–10.5)

## 2022-08-12 LAB — COMPREHENSIVE METABOLIC PANEL
ALT: 11 U/L (ref 0–35)
AST: 12 U/L (ref 0–37)
Albumin: 4.4 g/dL (ref 3.5–5.2)
Alkaline Phosphatase: 151 U/L — ABNORMAL HIGH (ref 39–117)
BUN: 18 mg/dL (ref 6–23)
CO2: 32 mEq/L (ref 19–32)
Calcium: 10.1 mg/dL (ref 8.4–10.5)
Chloride: 106 mEq/L (ref 96–112)
Creatinine, Ser: 1.04 mg/dL (ref 0.40–1.20)
GFR: 55.19 mL/min — ABNORMAL LOW (ref >60.00)
Glucose, Bld: 107 mg/dL — ABNORMAL HIGH (ref 70–99)
Potassium: 5.3 mEq/L — ABNORMAL HIGH (ref 3.5–5.1)
Sodium: 147 mEq/L — ABNORMAL HIGH (ref 135–145)
Total Bilirubin: 0.5 mg/dL (ref 0.2–1.2)
Total Protein: 6.9 g/dL (ref 6.0–8.3)

## 2022-08-12 LAB — LIPID PANEL
Cholesterol: 213 mg/dL — ABNORMAL HIGH (ref 0–200)
HDL: 55.5 mg/dL (ref >39.00)
LDL Cholesterol: 135 mg/dL — ABNORMAL HIGH (ref 0–99)
NonHDL: 157.87
Total CHOL/HDL Ratio: 4
Triglycerides: 116 mg/dL (ref 0.0–149.0)
VLDL: 23.2 mg/dL (ref 0.0–40.0)

## 2022-08-12 MED ORDER — LOSARTAN POTASSIUM 50 MG PO TABS: ORAL_TABLET | ORAL | 1 refills | Status: DC

## 2022-08-12 MED ORDER — BENZONATATE 100 MG PO CAPS: ORAL_CAPSULE | ORAL | 2 refills | Status: DC

## 2022-08-12 MED ORDER — METOPROLOL TARTRATE 25 MG PO TABS: 25.0000 mg | ORAL_TABLET | Freq: Two times a day (BID) | ORAL | 1 refills | Status: DC

## 2022-08-12 MED ORDER — HYDROXYZINE HCL 10 MG PO TABS: 10.0000 mg | ORAL_TABLET | Freq: Four times a day (QID) | ORAL | 2 refills | Status: DC

## 2022-08-12 NOTE — Progress Notes (Signed)
Subjective:    Patient ID: Tracy Mckenzie, female    DOB: August 01, 1956, 67 y.o.   MRN: [id number]  Chief Complaint  Patient presents with   Follow-up    Pt in for follow up and fasting labs; pt says all well, pt was sent to spine specialists due to old injury, doing PT first     HPI Patient is in today for f/up chronic conditions. Needs some refills today and labs updated.  Her main concern is still her back pain. Following with Dr. Newman Mckenzie, neurosurgery; Feb 6th is next appt. Currently doing physical therapy, thinks it is helping some. Still having most pain after being on feet for long periods. Pt very reluctant about doing back surgery because she has had trouble waking up with general anesthesia in the past. Also worried about pain medication after the fact.   Past Medical History:  Diagnosis Date   Arthritis    on meds   Bleeding ulcer    Cataract    recently dx-bilaterally   Elevated cholesterol    on meds   GERD (gastroesophageal reflux disease)    on meds   Hiatal hernia    High blood pressure    on meds   Kidney cysts    Liver cyst    Nerve pain    L sciatic pain radiating to R   Osteoporosis    reaction to Prolia=   Peptic ulcer disease    Seasonal allergies     Past Surgical History:  Procedure Laterality Date   APPENDECTOMY  1992   BREAST CYST ASPIRATION Left 2003   COLONOSCOPY  2014   [facility]   KNEE ARTHROSCOPY Right 04/12/2001   torn cartilage   LAPAROSCOPY  1981   for endometriosis   LEG SURGERY Right 11/23/2000   to remove screws   LEG SURGERY Right 02/08/2002   to remove rod   MENISCUS REPAIR  01/29/2003   TIBIA FRACTURE SURGERY Right 06/12/2000   Multiple surgeries   TONSILLECTOMY  1967   TOTAL ABDOMINAL HYSTERECTOMY  1992   WISDOM TOOTH EXTRACTION      Family History  Problem Relation Age of Onset   Kidney disease Mother    COPD Mother    COPD Father    Breast cancer Sister 49   Breast cancer Maternal Aunt     Breast cancer Paternal Aunt    Diabetes Maternal Grandfather    Stroke Paternal Grandfather    Colon cancer Neg Hx    Colon polyps Neg Hx    Esophageal cancer Neg Hx    Rectal cancer Neg Hx    Stomach cancer Neg Hx     Social History   Tobacco Use   Smoking status: Never   Smokeless tobacco: Never  Vaping Use   Vaping Use: Never used  Substance Use Topics   Alcohol use: No   Drug use: No     Allergies  Allergen Reactions   Meperidine Palpitations and Anaphylaxis    flushed   Ibuprofen Other (See Comments)    Stomach ulcers.   Nsaids Other (See Comments)    Peptic ulcers   Simvastatin     Other reaction(s): Muscle Pain   Tape Other (See Comments)    "Sometimes removes skin"   Oxycodone-Acetaminophen Itching and Rash   Prolia [Denosumab] Rash   Vicodin [Hydrocodone-Acetaminophen] Itching    Review of Systems NEGATIVE UNLESS OTHERWISE INDICATED IN HPI      Objective:     BP  130/76 (BP Location: Left Arm)   Pulse (!) 59   Temp (!) 97.5 F (36.4 C) (Temporal)   Ht '5\' 6"'$  (1.676 m)   Wt 179 lb 12.8 oz (81.6 kg)   SpO2 93%   BMI 29.02 kg/m   Wt Readings from Last 3 Encounters:  08/12/22 179 lb 12.8 oz (81.6 kg)  06/17/22 178 lb (80.7 kg)  05/19/22 178 lb (80.7 kg)    BP Readings from Last 3 Encounters:  08/12/22 130/76  06/17/22 132/60  04/19/22 (!) 151/72     Physical Exam Vitals and nursing note reviewed.  Constitutional:      Appearance: Normal appearance. She is normal weight. She is not toxic-appearing.     Comments: Using a cane  HENT:     Head: Normocephalic and atraumatic.  Eyes:     Extraocular Movements: Extraocular movements intact.     Conjunctiva/sclera: Conjunctivae normal.     Pupils: Pupils are equal, round, and reactive to light.  Cardiovascular:     Rate and Rhythm: Normal rate and regular rhythm.     Pulses: Normal pulses.     Heart sounds: Normal heart sounds.  Pulmonary:     Effort: Pulmonary effort is normal.      Breath sounds: Normal breath sounds.  Musculoskeletal:        General: Normal range of motion.     Cervical back: Normal range of motion and neck supple.     Lumbar back: No tenderness. Normal range of motion. Negative right straight leg raise test and negative left straight leg raise test.  Skin:    General: Skin is warm and dry.  Neurological:     General: No focal deficit present.     Mental Status: She is alert and oriented to person, place, and time.     Cranial Nerves: No cranial nerve deficit.     Motor: No weakness.     Coordination: Coordination normal.     Gait: Gait normal.  Psychiatric:        Mood and Affect: Mood normal.        Behavior: Behavior normal.        Assessment & Plan:  Essential hypertension Assessment & Plan: Normotensive Lopressor 25 mg BID Losartan 50 mg  HCTZ 12.5 mg   Orders: -     CBC with Differential/Platelet -     Comprehensive metabolic panel  Leukocytosis, unspecified type Assessment & Plan: Recheck labs today   Orders: -     CBC with Differential/Platelet -     Pathologist smear review  Chronic cough Assessment & Plan: Likely due to PND; tessalon perles and astelin nasal spray helps    Osteoporosis, unspecified osteoporosis type, unspecified pathological fracture presence  Mixed hyperlipidemia Assessment & Plan: Update labs today. Statin intolerant.   Orders: -     Lipid panel  Chronic left-sided low back pain without sciatica Assessment & Plan: Working with neurosurgeon; debating surgery   Other orders -     Benzonatate; TAKE 1 TO 2 CAPSULES BY MOUTH TWICE A DAY AS NEEDED FOR COUGH  Dispense: 30 capsule; Refill: 2 -     hydrOXYzine HCl; Take 1 tablet (10 mg total) by mouth every 6 (six) hours.  Dispense: 60 tablet; Refill: 2 -     Losartan Potassium; TAKE ONE TABLET BY MOUTH TWICE A DAY FOR BLOOD PRESSURE  Dispense: 180 tablet; Refill: 1 -     Metoprolol Tartrate; Take 1 tablet (25 mg total) by mouth  2 (two) times  daily.  Dispense: 180 tablet; Refill: 1        Return in about 6 months (around 02/10/2023) for recheck .  This note was prepared with assistance of Systems analyst. Occasional wrong-word or sound-a-like substitutions may have occurred due to the inherent limitations of voice recognition software.     Tracy Kimmel M Rether Rison, PA-C

## 2022-08-12 NOTE — Telephone Encounter (Signed)
Please see pt msg and imaging reports

## 2022-08-13 LAB — PATHOLOGIST SMEAR REVIEW

## 2022-08-15 NOTE — Assessment & Plan Note (Signed)
Recheck labs today. 

## 2022-08-15 NOTE — Assessment & Plan Note (Signed)
Working with neurosurgeon; debating surgery

## 2022-08-15 NOTE — Assessment & Plan Note (Signed)
Update labs today. Statin intolerant.

## 2022-08-15 NOTE — Assessment & Plan Note (Signed)
Normotensive Lopressor 25 mg BID Losartan 50 mg  HCTZ 12.5 mg

## 2022-08-15 NOTE — Assessment & Plan Note (Signed)
Likely due to PND; tessalon perles and astelin nasal spray helps

## 2022-08-16 ENCOUNTER — Other Ambulatory Visit: Payer: Self-pay

## 2022-08-16 DIAGNOSIS — E870 Hyperosmolality and hypernatremia: Secondary | ICD-10-CM

## 2022-08-16 DIAGNOSIS — E875 Hyperkalemia: Secondary | ICD-10-CM

## 2022-08-18 ENCOUNTER — Encounter: Payer: Self-pay | Admitting: Physical Therapy

## 2022-08-18 ENCOUNTER — Ambulatory Visit: Payer: Medicare HMO | Admitting: Physical Therapy

## 2022-08-18 DIAGNOSIS — M25561 Pain in right knee: Secondary | ICD-10-CM | POA: Diagnosis not present

## 2022-08-18 DIAGNOSIS — M5459 Other low back pain: Secondary | ICD-10-CM

## 2022-08-18 DIAGNOSIS — G8929 Other chronic pain: Secondary | ICD-10-CM | POA: Diagnosis not present

## 2022-08-18 NOTE — Therapy (Addendum)
OUTPATIENT PHYSICAL THERAPY LOWER EXTREMITY TREATMENT   Patient Name: Tracy Mckenzie MRN: [id number] DOB:27-Jan-1956, 67 y.o., female Today's Date: 08/18/22  END OF SESSION:  PT End of Session - 08/18/22 1115     Visit Number 8    Number of Visits 20    Date for PT Re-Evaluation 10/13/22    Authorization Type [employer]    PT Start Time 1107    PT Stop Time 1149    PT Time Calculation (min) 42 min    Activity Tolerance Patient tolerated treatment well    Behavior During Therapy WFL for tasks assessed/performed                Past Medical History:  Diagnosis Date   Arthritis    on meds   Bleeding ulcer    Cataract    recently dx-bilaterally   Elevated cholesterol    on meds   GERD (gastroesophageal reflux disease)    on meds   Hiatal hernia    High blood pressure    on meds   Kidney cysts    Liver cyst    Nerve pain    L sciatic pain radiating to R   Osteoporosis    reaction to Prolia=   Peptic ulcer disease    Seasonal allergies    Past Surgical History:  Procedure Laterality Date   APPENDECTOMY  1992   BREAST CYST ASPIRATION Left 2003   COLONOSCOPY  2014   [facility]   KNEE ARTHROSCOPY Right 04/12/2001   torn cartilage   LAPAROSCOPY  1981   for endometriosis   LEG SURGERY Right 11/23/2000   to remove screws   LEG SURGERY Right 02/08/2002   to remove rod   MENISCUS REPAIR  01/29/2003   TIBIA FRACTURE SURGERY Right 06/12/2000   Multiple surgeries   TONSILLECTOMY  1967   TOTAL ABDOMINAL HYSTERECTOMY  1992   WISDOM TOOTH EXTRACTION     Patient Active Problem List   Diagnosis Date Noted   Leukocytosis 08/12/2022   Chronic cough 08/12/2022   Back pain 11/27/2020   Osteopenia 11/27/2020   Sciatica 11/27/2020   Surgical menopause 11/27/2020   Tibia/fibula fracture 11/27/2020   Unilateral primary osteoarthritis, right knee 09/05/2019   Hyperlipidemia 08/15/2019   Essential hypertension 08/13/2019   GERD (gastroesophageal reflux  disease) 08/13/2019   Osteoporosis 08/13/2019   Post-traumatic osteoarthritis of right knee 08/13/2019   Hiatal hernia 08/13/2019   Chronic left-sided low back pain without sciatica 08/13/2019    PCP: Yetta Flock Allwardt  REFERRING PROVIDER: Yetta Flock Allwardt  REFERRING DIAG: Alyssa Allwardt  THERAPY DIAG:  Other low back pain  Chronic pain of right knee  Rationale for Evaluation and Treatment: Rehabilitation  ONSET DATE:   SUBJECTIVE:   SUBJECTIVE STATEMENT:  Pt with no new complaints. Still has significant pain with standing for a few min.   Eval: Patient states ongoing back pain.  She thinks problems started about 22 years ago when she broke her right leg.  She ended up having about 5 surgeries, and reports back pain after that.  She thinks that her right leg is weak.  Notes weakness with initial standing.  Also has been to back doctor and states that she is a candidate for surgery for her stenosis . She has the most pain in standing even after just a few minutes., and also with prolonged sitting. Pain on L >right.  Pain down and into her left glute.  CT scan from ER, shows old compression fx at  L1. States she has had a Comparison x-ray but I do not have this result yet.  R knee still painful. - is a candidate for knee replacement. Works 4 part time jobs.    She has not been consistent with exercise or treatment for back pain.    PERTINENT HISTORY: R knee pain, Previous tibia fracture, with complications and multiple surgeries.   PAIN:  Are you having pain? Yes: NPRS scale: 5-6/10 Pain location: back  Pain description: sore Aggravating factors: Standing, walking Relieving factors: Changing positions  Are you having pain? Yes: NPRS scale: 5-6/10 Pain location: R knee  Pain description: Sore Aggravating factors: Increased activity Relieving factors: Rest   PRECAUTIONS: None  WEIGHT BEARING RESTRICTIONS: No  FALLS:  Has patient fallen in last 6 months? Yes. Number  of falls 1 Pt tripped over a bag, she stumbled. No other falls.    PLOF: Independent  PATIENT GOALS: Decrease pain and back,  hopefully to avoid surgery   OBJECTIVE:  updated 08/18/22  DIAGNOSTIC FINDINGS:    COGNITION: Overall cognitive status: Within functional limits for tasks assessed    POSTURE: Valgus at right knee  PALPATION: Tenderness in left SI with pain down into left glutes muscles.  Hypomobile right knee-lacking full extension   LOWER EXTREMITY ROM: Lumbar: flexion: Mild limitation:   Extension: not tested (possible compression Fx),   Active ROM Right eval Left eval  Hip flexion wfl wfl  Hip extension    Hip abduction    Hip adduction    Hip internal rotation    Hip external rotation Mild limitation Mild limitation  Knee flexion 125 wnl  Knee extension -13 wnl  Ankle dorsiflexion    Ankle plantarflexion    Ankle inversion    Ankle eversion     (Blank rows = not tested)  LOWER EXTREMITY MMT:    L hip flexion: 4/5,  Abd: 4/5.  Knees: 4+/5   LOWER EXTREMITY SPECIAL TESTS:  Neg SLR,     GAIT:    TODAY'S TREATMENT:                                                                                                                              DATE:    08/18/22: Therapeutic Exercise: Aerobic:   Supine:    SLR 2 x 10 bil;    Bridging x 10;    Seated:  ,  Standing:   Standing march x 20 no UE support;  hip abd 3 x 5 bil;  Stretches:  Standing QL stretch 30 sec x 2 on L;  DKTC 30 sec x 3; Seated HSS 30 sec x 3 bil;   Neuromuscular Re-education: Manual Therapy:     08/11/22: Therapeutic Exercise: Aerobic:   Supine:    SLR 2 x 10 bil;    Bridging x 10;   S/L hip abd x 15 bil;  clams 2 x 10;  Seated:  Pelvic tilts x 20;  sit  to stand x 10- higher mat table,  Standing:   Standing march x 20 no UE support;  hip abd 3 x 5 bil; Gait with SPC 45 ft x 8, cuing for upright posture and shorter step length.  Stretches:  Standing QL stretch 30 sec x 2 on L;    Supine piriformis x 3 on L;  Neuromuscular Re-education: Manual Therapy: STM/tennis ball to L glute and L paraspinals.    08/04/21: Therapeutic Exercise: Aerobic:  Supine:   Quad sets x 20 on R;  SLR 2 x 10 bil;      S/L hip abd x 15 bil;  Seated:  LAQ 3lb x 20 bil;    Standing:   Standing march x 20 no UE support;  discussed standing/upright posture, to decrease loading on L.    Stretches:  Standing QL stretch 30 sec x 2 on L;  LTR x 10; Supine piriformis x 3 on L;  Neuromuscular Re-education: Manual Therapy: STM/tennis ball to L glute      PATIENT EDUCATION:  Education details: Reviewed HEP Person educated: Patient Education method: Explanation, Demonstration, Tactile cues, Verbal cues, and Handouts Education comprehension: verbalized understanding, returned demonstration, verbal cues required, tactile cues required, and needs further education   HOME EXERCISE PROGRAM: Access Code: [email or web address]   ASSESSMENT:  CLINICAL IMPRESSION: 2/77/41: Re-Cert: Pt has been seen for 8 visits. Discussed progress and plan today. She continues to be very limited with standing due to back pain. Hip pain has improved. Knee is still bothersome and limits activity. She has been doing well with HEP, but continues to have weakness in hips and LEs that she will benefit from continued strengthening. She is still considering back surgery as well as knee surgery. Recommended that she f/u with knee surgeon, as she has not seen in over a year. Plan to see pt for 1-2 more visits to finalize HEP, and will transfer pt to start aquatic therapy. This should be beneficial for her for offloading of back and knee, as well as [facility] Pt in agreement with plan.   Eval: Patient presents with primary complaint of pain in back. She has increased pain with standing and walking, and has difficulty sustaining activity. She also has significant pain and deficit in R knee, that are causing some gait  and movement deficits, which could also be contributing to back pain. She has decreased quality of movement, and will benefit from strengthening for knee and hips as well as for mobility and core strength for back. Pt to benefit from skilled PT to improve deficits, pain and ability for functional activity.    OBJECTIVE IMPAIRMENTS: Abnormal gait, decreased activity tolerance, decreased balance, decreased endurance, decreased knowledge of use of DME, decreased mobility, difficulty walking, decreased ROM, decreased strength, decreased safety awareness, hypomobility, increased muscle spasms, impaired flexibility, improper body mechanics, and pain.   ACTIVITY LIMITATIONS: lifting, bending, sitting, standing, squatting, stairs, transfers, and locomotion level  PARTICIPATION LIMITATIONS: meal prep, cleaning, laundry, shopping, community activity, and occupation  PERSONAL FACTORS: Time since onset of injury/illness/exacerbation are also affecting patient's functional outcome.   REHAB POTENTIAL: Good  CLINICAL DECISION MAKING: Stable/uncomplicated  EVALUATION COMPLEXITY: Low   GOALS: Goals reviewed with patient? Yes  SHORT TERM GOALS: Target date: 07/07/2022   Patient to be independent with initial HEP  Goal status: MET  2.  Patient to demonstrate optimal mechanics and use of single-point cane for offloading of right knee  Goal status: MET  LONG TERM GOALS: Target date: 10/13/2022  Patient to be independent with final HEP  Goal status: IN PROGRESS  2.  Patient to report soft tissue limitations in left Glute to be WNL to improve pain   Goal status: IN PROGRESS  3.  Patient to demonstrate range of motion for hips and lumbar spine to be within normal limits to improve ability for ADLs and IADLs  Goal status: IN PROGRESS/partially met  4.  Patient to demonstrate optimal mechanics with bed and lift squat positions to improve pain with ADLs  Goal status: MET  5.  Patient to  report pain in back and hip 0-3/10 with standing and walking activity up to 30 min.    Goal status: NOT MET   PLAN:  PT FREQUENCY: 1-2x/week  PT DURATION: 8 weeks  PLANNED INTERVENTIONS: Therapeutic exercises, Therapeutic activity, Neuromuscular re-education, Balance training, Gait training, Patient/Family education, Self Care, Joint mobilization, Joint manipulation, Stair training, Orthotic/Fit training, DME instructions, Aquatic Therapy, Dry Needling, Electrical stimulation, Spinal manipulation, Spinal mobilization, Cryotherapy, Moist heat, Taping, Vasopneumatic device, Traction, Ultrasound, Ionotophoresis '4mg'$ /ml Dexamethasone, and Manual therapy  PLAN FOR NEXT SESSION:    Lyndee Hensen, PT, DPT 6:25 PM  08/18/22

## 2022-08-25 ENCOUNTER — Encounter: Payer: Medicare HMO | Admitting: Physical Therapy

## 2022-08-26 ENCOUNTER — Other Ambulatory Visit: Payer: Medicare HMO

## 2022-08-30 ENCOUNTER — Encounter: Payer: Self-pay | Admitting: Physician Assistant

## 2022-08-30 NOTE — Telephone Encounter (Signed)
Please see pt msg and advise 

## 2022-08-31 NOTE — Telephone Encounter (Signed)
Please see pt msg, pt tested positive for Covid, change to virtual?

## 2022-09-01 ENCOUNTER — Encounter: Payer: Self-pay | Admitting: Physician Assistant

## 2022-09-01 ENCOUNTER — Other Ambulatory Visit: Payer: Medicare HMO

## 2022-09-01 ENCOUNTER — Encounter: Payer: Medicare HMO | Admitting: Physical Therapy

## 2022-09-01 ENCOUNTER — Telehealth (INDEPENDENT_AMBULATORY_CARE_PROVIDER_SITE_OTHER): Payer: Medicare HMO | Admitting: Physician Assistant

## 2022-09-01 VITALS — Ht 66.0 in | Wt 179.0 lb

## 2022-09-01 DIAGNOSIS — J0110 Acute frontal sinusitis, unspecified: Secondary | ICD-10-CM

## 2022-09-01 DIAGNOSIS — U071 COVID-19: Secondary | ICD-10-CM | POA: Diagnosis not present

## 2022-09-01 MED ORDER — FLUCONAZOLE 150 MG PO TABS: 150.0000 mg | ORAL_TABLET | Freq: Every day | ORAL | 0 refills | Status: DC

## 2022-09-01 MED ORDER — AMOXICILLIN-POT CLAVULANATE 875-125 MG PO TABS: 1.0000 | ORAL_TABLET | Freq: Two times a day (BID) | ORAL | 0 refills | Status: DC

## 2022-09-01 NOTE — Progress Notes (Signed)
   Virtual Visit via Video Note  I connected with  Tracy Mckenzie  on 09/01/22 at  2:00 PM EST by a video enabled telemedicine application and verified that I am speaking with the correct person using two identifiers.  Location: Patient: home Provider: Therapist, music at [place] present: Patient and myself   I discussed the limitations of evaluation and management by telemedicine and the availability of in person appointments. The patient expressed understanding and agreed to proceed.   History of Present Illness:  Chief complaint: COVID-19 positive via home test (08/31/22)  Symptom onset: Two weeks ago  Pertinent positives: Sinus congestion, runny nose, more tired than normal  Pertinent negatives: Fever, SOB, CP, n/v/d Treatments tried: Mucinex, saline nasal spray, 2 allergy medications daily,  Sick exposure: Daughter and husband with same symptoms    Observations/Objective:  Gen: Awake, alert, no acute distress, congested sounding Resp: Breathing is even and non-labored Psych: calm/pleasant demeanor Neuro: Alert and Oriented x 3, + facial symmetry, speech is clear.   Assessment and Plan:  1. COVID-19 2. Acute frontal sinusitis, recurrence not specified Diagnosis confirmed via home antigen test.  Patient is currently having mild symptoms. She has a coinciding frontal sinus infection, which we will treat with Augmentin at this time.  She will continue to do nasal saline rinses and her daily allergy medication.  She is outside the window 5 days of symptoms to treat for COVID with an antiviral.  Therefore, Paxlovid was not prescribed today.  Treat supportively at this time including sleeping prone, deep breathing exercises, pushing fluids, walking every few hours, vitamins C and D, and Tylenol or ibuprofen as needed.  The patient understands that COVID-19 illness can wax and wane.  Should the symptoms acutely worsen or patient starts to experience sudden  shortness of breath, chest pain, severe weakness, the patient will go straight to the emergency department.  Also advised home pulse oximetry monitoring and for any reading consistently under 92%, should also report to the emergency department.  The patient will continue to keep [place] updated.    Follow Up Instructions:    I discussed the assessment and treatment plan with the patient. The patient was provided an opportunity to ask questions and all were answered. The patient agreed with the plan and demonstrated an understanding of the instructions.   The patient was advised to call back or seek an in-person evaluation if the symptoms worsen or if the condition fails to improve as anticipated.  Ebrahim Deremer M Laszlo Ellerby, PA-C

## 2022-09-07 DIAGNOSIS — M48062 Spinal stenosis, lumbar region with neurogenic claudication: Secondary | ICD-10-CM | POA: Diagnosis not present

## 2022-09-07 DIAGNOSIS — S32010D Wedge compression fracture of first lumbar vertebra, subsequent encounter for fracture with routine healing: Secondary | ICD-10-CM | POA: Diagnosis not present

## 2022-10-21 ENCOUNTER — Other Ambulatory Visit: Payer: Self-pay | Admitting: Physician Assistant

## 2022-10-26 ENCOUNTER — Telehealth: Payer: Self-pay | Admitting: Physician Assistant

## 2022-10-26 NOTE — Telephone Encounter (Signed)
Contacted Analisia Odean to schedule their annual wellness visit. Appointment made for 11/02/2022.  [place] Direct Dial [phone]

## 2022-11-02 ENCOUNTER — Ambulatory Visit (INDEPENDENT_AMBULATORY_CARE_PROVIDER_SITE_OTHER): Payer: Medicare HMO

## 2022-11-02 VITALS — Wt 179.0 lb

## 2022-11-02 DIAGNOSIS — Z0000 Encounter for general adult medical examination without abnormal findings: Secondary | ICD-10-CM | POA: Diagnosis not present

## 2022-11-02 NOTE — Progress Notes (Signed)
I connected with  Ceonna Meckel on 11/02/22 by a audio enabled telemedicine application and verified that I am speaking with the correct person using two identifiers.  Patient Location: Home  Provider Location: Office/Clinic  I discussed the limitations of evaluation and management by telemedicine. The patient expressed understanding and agreed to proceed.   Subjective:   Mikalya Bruzzese is a 67 y.o. female who presents for Medicare Annual (Subsequent) preventive examination.  Review of Systems     Cardiac Risk Factors include: advanced age (>52men, >47 women);dyslipidemia;hypertension     Objective:    Today's Vitals   11/02/22 1130  Weight: 179 lb (81.2 kg)   Body mass index is 28.89 kg/m.     11/02/2022   11:39 AM 06/23/2022   11:31 AM 04/19/2022    4:48 PM 04/18/2022    4:38 PM 10/30/2021   11:13 AM 08/01/2021    4:51 PM 04/02/2021   11:34 AM  Advanced Directives  Does Patient Have a Medical Advance Directive? Yes No No Yes Yes Yes No;Yes  Type of Paramedic of [place];Living will    Healthcare Power of [place];Living will [place];Living will  Does patient want to make changes to medical advance directive?    No - Patient declined   No - Patient declined  Copy of [place] in Chart? No - copy requested    No - copy requested  No - copy requested  Would patient like information on creating a medical advance directive?  No - Patient declined     No - Patient declined    Current Medications (verified) Outpatient Encounter Medications as of 11/02/2022  Medication Sig   albuterol (VENTOLIN HFA) 108 (90 Base) MCG/ACT inhaler Inhale 2 puffs into the lungs every 6 (six) hours as needed for wheezing or shortness of breath.   azelastine (ASTELIN) 0.1 % nasal spray Place 1 spray into both nostrils 2 (two) times daily as needed for rhinitis or allergies.   baclofen (LIORESAL) 10 MG tablet  TAKE ONE TABLET BY MOUTH THREE TIMES A DAY   benzonatate (TESSALON) 100 MG capsule TAKE 1 TO 2 CAPSULES BY MOUTH TWICE A DAY AS NEEDED FOR COUGH   BORON PO Take 10 mg by mouth daily at 6 (six) AM. BIO BORON   Cholecalciferol (VITAMIN D3 PO) Take 10,000 Int'l Units/day by mouth daily at 6 (six) AM.   fluconazole (DIFLUCAN) 150 MG tablet Take 1 tablet (150 mg total) by mouth daily.   hydrochlorothiazide (MICROZIDE) 12.5 MG capsule TAKE ONE CAPSULE BY MOUTH DAILY   hydrOXYzine (ATARAX) 10 MG tablet Take 1 tablet (10 mg total) by mouth every 6 (six) hours.   levocetirizine (XYZAL) 5 MG tablet Take 5 mg by mouth 2 (two) times daily.   losartan (COZAAR) 50 MG tablet TAKE ONE TABLET BY MOUTH TWICE A DAY FOR BLOOD PRESSURE   magnesium 30 MG tablet Take 90 mg by mouth daily.   methocarbamol (ROBAXIN) 500 MG tablet Take 1 tablet (500 mg total) by mouth 3 (three) times daily as needed for muscle spasms.   metoprolol tartrate (LOPRESSOR) 25 MG tablet Take 1 tablet (25 mg total) by mouth 2 (two) times daily.   montelukast (SINGULAIR) 10 MG tablet TAKE ONE TABLET BY MOUTH AT BEDTIME   nitroGLYCERIN (NITROSTAT) 0.4 MG SL tablet Place 1 tablet (0.4 mg total) under the tongue every 5 (five) minutes as needed for chest pain.   omeprazole (PRILOSEC) 40  MG capsule TAKE 1 CAPSULE BY MOUTH DAILY 30 MINUTES BEFORE BREAKFAST   Spacer/Aero-Holding Chambers (PRO COMFORT SPACER ADULT) MISC 1 Device by Does not apply route as needed.   traMADol-acetaminophen (ULTRACET) 37.5-325 MG tablet Take 1 tablet by mouth every 8 (eight) hours as needed.   zinc gluconate 50 MG tablet Take 50 mg by mouth daily.   AREXVY 120 MCG/0.5ML injection    FLUAD QUADRIVALENT 0.5 ML injection Inject 0.5 mLs into the muscle once.   gabapentin (NEURONTIN) 300 MG capsule TAKE ONE CAPSULE BY MOUTH TWICE A DAY (Patient not taking: Reported on 11/02/2022)   [DISCONTINUED] amoxicillin-clavulanate (AUGMENTIN) 875-125 MG tablet Take 1 tablet by mouth 2  (two) times daily.   [DISCONTINUED] fluticasone (CUTIVATE) 0.05 % cream Apply 1 application topically as needed (rash).   [DISCONTINUED] lidocaine (LIDODERM) 5 % Place 1 patch onto the skin daily. Remove & Discard patch within 12 hours or as directed by MD   [DISCONTINUED] metroNIDAZOLE (METROCREAM) 0.75 % cream Apply 1 application topically 2 (two) times daily.   [DISCONTINUED] RA TURMERIC PO Take 4 capsules by mouth daily at 6 (six) AM. Turmeric/fish oil-omega 3/epimedium/trans-resveratotrol=RELIEF FACTOR   [DISCONTINUED] tacrolimus (PROTOPIC) 0.1 % ointment Apply 1 application topically as needed (rash).   No facility-administered encounter medications on file as of 11/02/2022.    Allergies (verified) Meperidine, Ibuprofen, Nsaids, Simvastatin, Tape, Oxycodone-acetaminophen, Prolia [denosumab], and Vicodin [hydrocodone-acetaminophen]   History: Past Medical History:  Diagnosis Date   Arthritis    on meds   Bleeding ulcer    Cataract    recently dx-bilaterally   Elevated cholesterol    on meds   GERD (gastroesophageal reflux disease)    on meds   Hiatal hernia    High blood pressure    on meds   Kidney cysts    Liver cyst    Nerve pain    L sciatic pain radiating to R   Osteoporosis    reaction to Prolia=   Peptic ulcer disease    Seasonal allergies    Past Surgical History:  Procedure Laterality Date   APPENDECTOMY  1992   BREAST CYST ASPIRATION Left 2003   COLONOSCOPY  2014   [facility]   KNEE ARTHROSCOPY Right 04/12/2001   torn cartilage   LAPAROSCOPY  1981   for endometriosis   LEG SURGERY Right 11/23/2000   to remove screws   LEG SURGERY Right 02/08/2002   to remove rod   MENISCUS REPAIR  01/29/2003   TIBIA FRACTURE SURGERY Right 06/12/2000   Multiple surgeries   TONSILLECTOMY  1967   TOTAL ABDOMINAL HYSTERECTOMY  1992   WISDOM TOOTH EXTRACTION     Family History  Problem Relation Age of Onset   Kidney disease Mother    COPD Mother    COPD  Father    Breast cancer Sister 48   Breast cancer Maternal Aunt    Breast cancer Paternal Aunt    Diabetes Maternal Grandfather    Stroke Paternal Grandfather    Colon cancer Neg Hx    Colon polyps Neg Hx    Esophageal cancer Neg Hx    Rectal cancer Neg Hx    Stomach cancer Neg Hx    Social History   Socioeconomic History   Marital status: Married    Spouse name: Not on file   Number of children: Not on file   Years of education: Not on file   Highest education level: Not on file  Occupational History   Not on  file  Tobacco Use   Smoking status: Never   Smokeless tobacco: Never  Vaping Use   Vaping Use: Never used  Substance and Sexual Activity   Alcohol use: No   Drug use: No   Sexual activity: Not on file  Other Topics Concern   Not on file  Social History Narrative   Not on file   Social Determinants of Health   Financial Resource Strain: Low Risk  (11/02/2022)   Overall Financial Resource Strain (CARDIA)    Difficulty of Paying Living Expenses: Not hard at all  Food Insecurity: No Food Insecurity (11/02/2022)   Hunger Vital Sign    Worried About Running Out of Food in the Last Year: Never true    Ran Out of Food in the Last Year: Never true  Transportation Needs: No Transportation Needs (11/02/2022)   PRAPARE - Hydrologist (Medical): No    Lack of Transportation (Non-Medical): No  Physical Activity: Inactive (11/02/2022)   Exercise Vital Sign    Days of Exercise per Week: 0 days    Minutes of Exercise per Session: 0 min  Stress: Stress Concern Present (11/02/2022)   [place]    Feeling of Stress : To some extent  Social Connections: Moderately Integrated (11/02/2022)   Social Connection and Isolation Panel [NHANES]    Frequency of Communication with Friends and Family: More than three times a week    Frequency of Social Gatherings with Friends and Family: More than three  times a week    Attends Religious Services: 1 to 4 times per year    Active Member of [employer] or Organizations: No    Attends Music therapist: Never    Marital Status: Married    Tobacco Counseling Counseling given: Not Answered   Clinical Intake:  Pre-visit preparation completed: Yes  Pain : No/denies pain     BMI - recorded: 28.89 Nutritional Status: BMI 25 -29 Overweight Nutritional Risks: None Diabetes: No  How often do you need to have someone help you when you read instructions, pamphlets, or other written materials from your doctor or pharmacy?: 1 - Never  Diabetic?no  Interpreter Needed?: No  Information entered by :: Charlott Rakes, LPN   Activities of Daily Living    11/02/2022   11:39 AM  In your present state of health, do you have any difficulty performing the following activities:  Hearing? 0  Vision? 0  Difficulty concentrating or making decisions? 0  Walking or climbing stairs? 0  Dressing or bathing? 0  Doing errands, shopping? 0  Preparing Food and eating ? N  Using the Toilet? N  In the past six months, have you accidently leaked urine? N  Do you have problems with loss of bowel control? N  Managing your Medications? N  Managing your Finances? N  Housekeeping or managing your Housekeeping? N    Patient Care Team: Allwardt, Randa Evens, PA-C as PCP - General (Physician Assistant)  Indicate any recent Medical Services you may have received from other than [facility] providers in the past year (date may be approximate).     Assessment:   This is a routine wellness examination for Rhyla.  Hearing/Vision screen Hearing Screening - Comments:: Pt denies any hearing issues  Vision Screening - Comments:: Pt follows up with Dr For eye annual   Dietary issues and exercise activities discussed: Current Exercise Habits: The patient does not participate in regular exercise  at present   Goals Addressed             This Visit's Progress     Patient Stated       None at this time        Depression Screen    11/02/2022   11:36 AM 08/12/2022    8:07 AM 10/30/2021   11:12 AM 05/20/2021    9:46 AM 04/02/2021   11:48 AM 10/24/2020   11:11 AM 05/07/2020    1:13 PM  PHQ 2/9 Scores  PHQ - 2 Score 0 0 0 0 0 0 0    Fall Risk    11/02/2022   11:39 AM 08/12/2022    8:07 AM 10/30/2021   11:14 AM 05/20/2021    9:46 AM 04/02/2021   11:47 AM  Fall Risk   Falls in the past year? 0 0 0 0 0  Number falls in past yr: 0 0 0  0  Injury with Fall? 0 0 0  0  Risk for fall due to : Impaired vision;Impaired mobility;Impaired balance/gait No Fall Risks Impaired vision;Impaired balance/gait;Impaired mobility  Impaired balance/gait;Impaired mobility;Medication side effect;Orthopedic patient  Follow up Falls prevention discussed Falls evaluation completed Falls prevention discussed  Falls prevention discussed;Education provided;Falls evaluation completed    FALL RISK PREVENTION PERTAINING TO THE HOME:  Any stairs in or around the home? No  If so, are there any without handrails? No  Home free of loose throw rugs in walkways, pet beds, electrical cords, etc? Yes  Adequate lighting in your home to reduce risk of falls? Yes   ASSISTIVE DEVICES UTILIZED TO PREVENT FALLS:  Life alert? No  Use of a cane, walker or w/c? Yes  Grab bars in the bathroom? No  Shower chair or bench in shower? Yes  Elevated toilet seat or a handicapped toilet? No   TIMED UP AND GO:  Was the test performed? No .   Cognitive Function:        11/02/2022   11:40 AM 10/30/2021   11:15 AM 10/24/2020   11:16 AM  6CIT Screen  What Year? 0 points 0 points 0 points  What month? 0 points 0 points 0 points  What time? 0 points 0 points   Count back from 20 0 points 0 points 0 points  Months in reverse 0 points 0 points 0 points  Repeat phrase 0 points 0 points 0 points  Total Score 0 points 0 points     Immunizations Immunization History  Administered Date(s)  Administered   COVID-19, mRNA, vaccine(Comirnaty)12 years and older 06/30/2020   Fluad Quad(high Dose 65+) 05/20/2021   Influenza, High Dose Seasonal PF 05/10/2019   Influenza-Unspecified 05/30/2015, 08/01/2017, 06/30/2020, 06/28/2022   Moderna Sars-Covid-2 Vaccination 09/15/2019, 10/13/2019   PNEUMOCOCCAL CONJUGATE-20 02/05/2022   Pneumococcal Polysaccharide-23 07/11/2019   RSV,unspecified 06/28/2022   Tdap 05/02/2014   Zoster, Live 05/31/2015, 06/06/2015    TDAP status: Up to date  Flu Vaccine status: Up to date  Pneumococcal vaccine status: Up to date  Covid-19 vaccine status: Completed vaccines  Qualifies for Shingles Vaccine? Yes   Zostavax completed No   Shingrix Completed?: No.    Education has been provided regarding the importance of this vaccine. Patient has been advised to call insurance company to determine out of pocket expense if they have not yet received this vaccine. Advised may also receive vaccine at local pharmacy or Health Dept. Verbalized acceptance and understanding.  Screening Tests Health Maintenance  Topic Date Due  Zoster Vaccines- Shingrix (1 of 2) 11/11/2022 (Originally 01/07/1975)   INFLUENZA VACCINE  03/03/2023   MAMMOGRAM  06/17/2023   Medicare Annual Wellness (AWV)  11/02/2023   DTaP/Tdap/Td (2 - Td or Tdap) 05/02/2024   COLONOSCOPY (Pts 45-25yrs Insurance coverage will need to be confirmed)  06/18/2027   Pneumonia Vaccine 35+ Years old  Completed   DEXA SCAN  Completed   Hepatitis C Screening  Completed   HPV VACCINES  Aged Out   COVID-19 Vaccine  Discontinued    Health Maintenance  There are no preventive care reminders to display for this patient.   Colorectal cancer screening: Type of screening: Colonoscopy. Completed 06/17/22. Repeat every 5 years  Mammogram status: Completed 06/16/22. Repeat every year  Bone Density status: Completed 06/28/22. Results reflect: Bone density results: OSTEOPOROSIS. Repeat every 2  years.   Additional Screening:  Hepatitis C Screening:  Completed 08/13/19  Vision Screening: Recommended annual ophthalmology exams for early detection of glaucoma and other disorders of the eye. Is the patient up to date with their annual eye exam?  Yes  Who is the provider or what is the name of the office in which the patient attends annual eye exams? Unsure of providers name  If pt is not established with a provider, would they like to be referred to a provider to establish care? No .   Dental Screening: Recommended annual dental exams for proper oral hygiene  Community Resource Referral / Chronic Care Management: CRR required this visit?  No   CCM required this visit?  No      Plan:     I have personally reviewed and noted the following in the patient's chart:   Medical and social history Use of alcohol, tobacco or illicit drugs  Current medications and supplements including opioid prescriptions. Patient is currently taking opioid prescriptions. Information provided to patient regarding non-opioid alternatives. Patient advised to discuss non-opioid treatment plan with their provider. Functional ability and status Nutritional status Physical activity Advanced directives List of other physicians Hospitalizations, surgeries, and ER visits in previous 12 months Vitals Screenings to include cognitive, depression, and falls Referrals and appointments  In addition, I have reviewed and discussed with patient certain preventive protocols, quality metrics, and best practice recommendations. A written personalized care plan for preventive services as well as general preventive health recommendations were provided to patient.     Willette Brace, LPN   [id number]   Nurse Notes: none

## 2022-11-02 NOTE — Patient Instructions (Signed)
Tracy Mckenzie , Thank you for taking time to come for your Medicare Wellness Visit. I appreciate your ongoing commitment to your health goals. Please review the following plan we discussed and let me know if I can assist you in the future.   These are the goals we discussed:  Goals      Patient Stated     Lose weight      Patient Stated     Lose weight      Patient Stated     None at this time         This is a list of the screening recommended for you and due dates:  Health Maintenance  Topic Date Due   Zoster (Shingles) Vaccine (1 of 2) 11/11/2022*   Flu Shot  03/03/2023   Mammogram  06/17/2023   Medicare Annual Wellness Visit  11/02/2023   DTaP/Tdap/Td vaccine (2 - Td or Tdap) 05/02/2024   Colon Cancer Screening  06/18/2027   Pneumonia Vaccine  Completed   DEXA scan (bone density measurement)  Completed   Hepatitis C Screening: USPSTF Recommendation to screen - Ages 100-79 yo.  Completed   HPV Vaccine  Aged Out   COVID-19 Vaccine  Discontinued  *Topic was postponed. The date shown is not the original due date.    Advanced directives: Please bring a copy of your health care power of attorney and living will to the office at your convenience.  Conditions/risks identified: none at this time   Next appointment: Follow up in one year for your annual wellness visit    Preventive Care 65 Years and Older, Female Preventive care refers to lifestyle choices and visits with your health care provider that can promote health and wellness. What does preventive care include? A yearly physical exam. This is also called an annual well check. Dental exams once or twice a year. Routine eye exams. Ask your health care provider how often you should have your eyes checked. Personal lifestyle choices, including: Daily care of your teeth and gums. Regular physical activity. Eating a healthy diet. Avoiding tobacco and drug use. Limiting alcohol use. Practicing safe sex. Taking low-dose  aspirin every day. Taking vitamin and mineral supplements as recommended by your health care provider. What happens during an annual well check? The services and screenings done by your health care provider during your annual well check will depend on your age, overall health, lifestyle risk factors, and family history of disease. Counseling  Your health care provider may ask you questions about your: Alcohol use. Tobacco use. Drug use. Emotional well-being. Home and relationship well-being. Sexual activity. Eating habits. History of falls. Memory and ability to understand (cognition). Work and work Statistician. Reproductive health. Screening  You may have the following tests or measurements: Height, weight, and BMI. Blood pressure. Lipid and cholesterol levels. These may be checked every 5 years, or more frequently if you are over 43 years old. Skin check. Lung cancer screening. You may have this screening every year starting at age 60 if you have a 30-pack-year history of smoking and currently smoke or have quit within the past 15 years. Fecal occult blood test (FOBT) of the stool. You may have this test every year starting at age 50. Flexible sigmoidoscopy or colonoscopy. You may have a sigmoidoscopy every 5 years or a colonoscopy every 10 years starting at age 54. Hepatitis C blood test. Hepatitis B blood test. Sexually transmitted disease (STD) testing. Diabetes screening. This is done by checking your blood  sugar (glucose) after you have not eaten for a while (fasting). You may have this done every 1-3 years. Bone density scan. This is done to screen for osteoporosis. You may have this done starting at age 49. Mammogram. This may be done every 1-2 years. Talk to your health care provider about how often you should have regular mammograms. Talk with your health care provider about your test results, treatment options, and if necessary, the need for more tests. Vaccines  Your  health care provider may recommend certain vaccines, such as: Influenza vaccine. This is recommended every year. Tetanus, diphtheria, and acellular pertussis (Tdap, Td) vaccine. You may need a Td booster every 10 years. Zoster vaccine. You may need this after age 81. Pneumococcal 13-valent conjugate (PCV13) vaccine. One dose is recommended after age 30. Pneumococcal polysaccharide (PPSV23) vaccine. One dose is recommended after age 29. Talk to your health care provider about which screenings and vaccines you need and how often you need them. This information is not intended to replace advice given to you by your health care provider. Make sure you discuss any questions you have with your health care provider. Document Released: 08/15/2015 Document Revised: 04/07/2016 Document Reviewed: 05/20/2015 Elsevier Interactive Patient Education  2017 [place] Prevention in the Home Falls can cause injuries. They can happen to people of all ages. There are many things you can do to make your home safe and to help prevent falls. What can I do on the outside of my home? Regularly fix the edges of walkways and driveways and fix any cracks. Remove anything that might make you trip as you walk through a door, such as a raised step or threshold. Trim any bushes or trees on the path to your home. Use bright outdoor lighting. Clear any walking paths of anything that might make someone trip, such as rocks or tools. Regularly check to see if handrails are loose or broken. Make sure that both sides of any steps have handrails. Any raised decks and porches should have guardrails on the edges. Have any leaves, snow, or ice cleared regularly. Use sand or salt on walking paths during winter. Clean up any spills in your garage right away. This includes oil or grease spills. What can I do in the bathroom? Use night lights. Install grab bars by the toilet and in the tub and shower. Do not use towel bars as  grab bars. Use non-skid mats or decals in the tub or shower. If you need to sit down in the shower, use a plastic, non-slip stool. Keep the floor dry. Clean up any water that spills on the floor as soon as it happens. Remove soap buildup in the tub or shower regularly. Attach bath mats securely with double-sided non-slip rug tape. Do not have throw rugs and other things on the floor that can make you trip. What can I do in the bedroom? Use night lights. Make sure that you have a light by your bed that is easy to reach. Do not use any sheets or blankets that are too big for your bed. They should not hang down onto the floor. Have a firm chair that has side arms. You can use this for support while you get dressed. Do not have throw rugs and other things on the floor that can make you trip. What can I do in the kitchen? Clean up any spills right away. Avoid walking on wet floors. Keep items that you use a lot in easy-to-reach  places. If you need to reach something above you, use a strong step stool that has a grab bar. Keep electrical cords out of the way. Do not use floor polish or wax that makes floors slippery. If you must use wax, use non-skid floor wax. Do not have throw rugs and other things on the floor that can make you trip. What can I do with my stairs? Do not leave any items on the stairs. Make sure that there are handrails on both sides of the stairs and use them. Fix handrails that are broken or loose. Make sure that handrails are as long as the stairways. Check any carpeting to make sure that it is firmly attached to the stairs. Fix any carpet that is loose or worn. Avoid having throw rugs at the top or bottom of the stairs. If you do have throw rugs, attach them to the floor with carpet tape. Make sure that you have a light switch at the top of the stairs and the bottom of the stairs. If you do not have them, ask someone to add them for you. What else can I do to help prevent  falls? Wear shoes that: Do not have high heels. Have rubber bottoms. Are comfortable and fit you well. Are closed at the toe. Do not wear sandals. If you use a stepladder: Make sure that it is fully opened. Do not climb a closed stepladder. Make sure that both sides of the stepladder are locked into place. Ask someone to hold it for you, if possible. Clearly mark and make sure that you can see: Any grab bars or handrails. First and last steps. Where the edge of each step is. Use tools that help you move around (mobility aids) if they are needed. These include: Canes. Walkers. Scooters. Crutches. Turn on the lights when you go into a dark area. Replace any light bulbs as soon as they burn out. Set up your furniture so you have a clear path. Avoid moving your furniture around. If any of your floors are uneven, fix them. If there are any pets around you, be aware of where they are. Review your medicines with your doctor. Some medicines can make you feel dizzy. This can increase your chance of falling. Ask your doctor what other things that you can do to help prevent falls. This information is not intended to replace advice given to you by your health care provider. Make sure you discuss any questions you have with your health care provider. Document Released: 05/15/2009 Document Revised: 12/25/2015 Document Reviewed: 08/23/2014 Elsevier Interactive Patient Education  2017 [employer].

## 2022-11-04 ENCOUNTER — Encounter: Payer: Self-pay | Admitting: Physical Therapy

## 2022-11-04 ENCOUNTER — Ambulatory Visit: Payer: Medicare HMO | Admitting: Physical Therapy

## 2022-11-04 DIAGNOSIS — M25561 Pain in right knee: Secondary | ICD-10-CM

## 2022-11-04 DIAGNOSIS — M5459 Other low back pain: Secondary | ICD-10-CM | POA: Diagnosis not present

## 2022-11-04 DIAGNOSIS — G8929 Other chronic pain: Secondary | ICD-10-CM

## 2022-11-04 NOTE — Therapy (Signed)
OUTPATIENT PHYSICAL THERAPY LOWER EXTREMITY TREATMENT/Re-cert/ D/C   Patient Name: Tracy Mckenzie MRN: [id number] DOB:28-Mar-1956, 67 y.o., female Today's Date: 11/04/22  END OF SESSION:  PT End of Session - 11/04/22 0853     Visit Number 9    Number of Visits 20    Date for PT Re-Evaluation 11/04/22    Authorization Type [employer] Re-Cert done at visit 8, and 9    PT Start Time 0847    PT Stop Time 0929    PT Time Calculation (min) 42 min    Activity Tolerance Patient tolerated treatment well    Behavior During Therapy WFL for tasks assessed/performed                Past Medical History:  Diagnosis Date   Arthritis    on meds   Bleeding ulcer    Cataract    recently dx-bilaterally   Elevated cholesterol    on meds   GERD (gastroesophageal reflux disease)    on meds   Hiatal hernia    High blood pressure    on meds   Kidney cysts    Liver cyst    Nerve pain    L sciatic pain radiating to R   Osteoporosis    reaction to Prolia=   Peptic ulcer disease    Seasonal allergies    Past Surgical History:  Procedure Laterality Date   APPENDECTOMY  1992   BREAST CYST ASPIRATION Left 2003   COLONOSCOPY  2014   [facility]   KNEE ARTHROSCOPY Right 04/12/2001   torn cartilage   LAPAROSCOPY  1981   for endometriosis   LEG SURGERY Right 11/23/2000   to remove screws   LEG SURGERY Right 02/08/2002   to remove rod   MENISCUS REPAIR  01/29/2003   TIBIA FRACTURE SURGERY Right 06/12/2000   Multiple surgeries   [facility]   WISDOM TOOTH EXTRACTION     Patient Active Problem List   Diagnosis Date Noted   Leukocytosis 08/12/2022   Chronic cough 08/12/2022   Back pain 11/27/2020   Osteopenia 11/27/2020   Sciatica 11/27/2020   Surgical menopause 11/27/2020   Tibia/fibula fracture 11/27/2020   Unilateral primary osteoarthritis, right knee 09/05/2019   Hyperlipidemia 08/15/2019   Essential hypertension  08/13/2019   GERD (gastroesophageal reflux disease) 08/13/2019   Osteoporosis 08/13/2019   Post-traumatic osteoarthritis of right knee 08/13/2019   Hiatal hernia 08/13/2019   Chronic left-sided low back pain without sciatica 08/13/2019    PCP: Yetta Flock Allwardt  REFERRING PROVIDER: Yetta Flock Allwardt  REFERRING DIAG: Alyssa Allwardt  THERAPY DIAG:  Other low back pain  Chronic pain of right knee  Rationale for Evaluation and Treatment: Rehabilitation  ONSET DATE:   SUBJECTIVE:   SUBJECTIVE STATEMENT:  Pt last seen 1/17. States she was sick,and has not been able to do much exercise/HEP. She reports still having much pain in knee and in back. She thinks she is ready to see orthopedic for her knee and discuss option of surgery.   Eval: Patient states ongoing back pain.  She thinks problems started about 22 years ago when she broke her right leg.  She ended up having about 5 surgeries, and reports back pain after that.  She thinks that her right leg is weak.  Notes weakness with initial standing.  Also has been to back doctor and states that she is a candidate for surgery for her stenosis . She has the most  pain in standing even after just a few minutes., and also with prolonged sitting. Pain on L >right.  Pain down and into her left glute.  CT scan from ER, shows old compression fx at L1. States she has had a Comparison x-ray but I do not have this result yet.  R knee still painful. - is a candidate for knee replacement. Works 4 part time jobs.    She has not been consistent with exercise or treatment for back pain.    PERTINENT HISTORY: R knee pain, Previous tibia fracture, with complications and multiple surgeries.   PAIN:  Are you having pain? Yes: NPRS scale: 5-6/10 Pain location: back  Pain description: sore Aggravating factors: Standing, walking Relieving factors: Changing positions  Are you having pain? Yes: NPRS scale: 5-6/10 Pain location: R knee  Pain description:  Sore Aggravating factors: Increased activity Relieving factors: Rest   PRECAUTIONS: None  WEIGHT BEARING RESTRICTIONS: No  FALLS:  Has patient fallen in last 6 months? Yes. Number of falls 1 Pt tripped over a bag, she stumbled. No other falls.    PLOF: Independent  PATIENT GOALS: Decrease pain and back,  hopefully to avoid surgery   OBJECTIVE:  updated 11/04/22  DIAGNOSTIC FINDINGS:    COGNITION: Overall cognitive status: Within functional limits for tasks assessed    POSTURE: Valgus at right knee  PALPATION: Tenderness in left SI with pain down into left glutes muscles.  Hypomobile right knee-lacking full extension   LOWER EXTREMITY ROM: Lumbar: flexion: Mild limitation:   Extension: not tested (possible compression Fx),   Active ROM Right eval Left eval  Hip flexion wfl wfl  Hip extension    Hip abduction    Hip adduction    Hip internal rotation    Hip external rotation Mild limitation Mild limitation  Knee flexion 125 wnl  Knee extension -13 wnl  Ankle dorsiflexion    Ankle plantarflexion    Ankle inversion    Ankle eversion     (Blank rows = not tested)  LOWER EXTREMITY MMT:    L hip flexion: 4/5,  Abd: 4/5.  Knees: 4+/5   LOWER EXTREMITY SPECIAL TESTS:  Neg SLR,     GAIT:   TODAY'S TREATMENT:                                                                                                                              DATE:    11/04/22: Therapeutic Exercise: Aerobic:   Supine:    SLR 2 x 10 bil;    Bridging x 10; quad sets x 10 bil;    Seated:  sit to stand x 5; LAQ x 10  on R:  Standing:   Standing march x 20;  hip abd 2 x 10 bil;   Stretches:  Standing QL stretch 30 sec x 2 on L;   Seated HSS 30 sec x 3 bil;   Neuromuscular Re-education: Manual Therapy:  PATIENT EDUCATION:  Education details: Reviewed HEP Person educated: Patient Education method: Explanation, Demonstration, Tactile cues, Verbal cues, and Handouts Education  comprehension: verbalized understanding, returned demonstration, verbal cues required, tactile cues required, and needs further education   HOME EXERCISE PROGRAM: Access Code: [email or web address]   ASSESSMENT:  CLINICAL IMPRESSION: Pt returns after last visit on 08/18/21. She is still in a lot of pain with knee and back. She is doing notably better with performing exercises for HEP in session today, but reports being inconsistent with them at home lately. Discussed importance of trying position changes and activity even when painful.Reviewed HEP today. Discussed need to return to [place] and see orthopedic for knee due to ongoing pain. Pt in agreement with plan.    [id number]: Re-Cert: Pt has been seen for 8 visits. Discussed progress and plan today. She continues to be very limited with standing due to back pain. Hip pain has improved. Knee is still bothersome and limits activity. She has been doing well with HEP, but continues to have weakness in hips and LEs that she will benefit from continued strengthening. She is still considering back surgery as well as knee surgery. Recommended that she f/u with knee surgeon, as she has not seen in over a year. Plan to see pt for 1-2 more visits to finalize HEP, and will transfer pt to start aquatic therapy. This should be beneficial for her for offloading of back and knee, as well as [facility] Pt in agreement with plan.   Eval: Patient presents with primary complaint of pain in back. She has increased pain with standing and walking, and has difficulty sustaining activity. She also has significant pain and deficit in R knee, that are causing some gait and movement deficits, which could also be contributing to back pain. She has decreased quality of movement, and will benefit from strengthening for knee and hips as well as for mobility and core strength for back. Pt to benefit from skilled PT to improve deficits, pain and ability for functional  activity.    OBJECTIVE IMPAIRMENTS: Abnormal gait, decreased activity tolerance, decreased balance, decreased endurance, decreased knowledge of use of DME, decreased mobility, difficulty walking, decreased ROM, decreased strength, decreased safety awareness, hypomobility, increased muscle spasms, impaired flexibility, improper body mechanics, and pain.   ACTIVITY LIMITATIONS: lifting, bending, sitting, standing, squatting, stairs, transfers, and locomotion level  PARTICIPATION LIMITATIONS: meal prep, cleaning, laundry, shopping, community activity, and occupation  PERSONAL FACTORS: Time since onset of injury/illness/exacerbation are also affecting patient's functional outcome.   REHAB POTENTIAL: Good  CLINICAL DECISION MAKING: Stable/uncomplicated  EVALUATION COMPLEXITY: Low   GOALS: Goals reviewed with patient? Yes  SHORT TERM GOALS: Target date: 07/07/2022   Patient to be independent with initial HEP  Goal status: MET  2.  Patient to demonstrate optimal mechanics and use of single-point cane for offloading of right knee  Goal status: MET    LONG TERM GOALS: Target date: 10/13/2022  Patient to be independent with final HEP  Goal status: MET  2.  Patient to report soft tissue limitations in left Glute to be WNL to improve pain   Goal status: IN PROGRESS  3.  Patient to demonstrate range of motion for hips and lumbar spine to be within normal limits to improve ability for ADLs and IADLs  Goal status: IN PROGRESS/partially met  4.  Patient to demonstrate optimal mechanics with bed and lift squat positions to improve pain with ADLs  Goal status:  MET  5.  Patient to report pain in back and hip 0-3/10 with standing and walking activity up to 30 min.    Goal status: NOT MET   PLAN:  PT FREQUENCY: 1-2x/week  PT DURATION: 8 weeks  PLANNED INTERVENTIONS: Therapeutic exercises, Therapeutic activity, Neuromuscular re-education, Balance training, Gait training,  Patient/Family education, Self Care, Joint mobilization, Joint manipulation, Stair training, Orthotic/Fit training, DME instructions, Aquatic Therapy, Dry Needling, Electrical stimulation, Spinal manipulation, Spinal mobilization, Cryotherapy, Moist heat, Taping, Vasopneumatic device, Traction, Ultrasound, Ionotophoresis 4mg /ml Dexamethasone, and Manual therapy  PLAN FOR NEXT SESSION:   Lyndee Hensen, PT, DPT 9:43 PM  11/04/22  PHYSICAL THERAPY DISCHARGE SUMMARY  Visits from Start of Care: 9 Plan: Patient agrees to discharge.  Patient goals were partially  met. Patient is being discharged due to continued pain, referring to Ortho .     Lyndee Hensen, PT, DPT 9:54 PM  11/04/22

## 2022-11-18 DIAGNOSIS — M1711 Unilateral primary osteoarthritis, right knee: Secondary | ICD-10-CM | POA: Diagnosis not present

## 2022-11-18 DIAGNOSIS — M25561 Pain in right knee: Secondary | ICD-10-CM | POA: Insufficient documentation

## 2022-11-22 ENCOUNTER — Telehealth: Payer: Self-pay

## 2022-11-22 NOTE — Telephone Encounter (Signed)
Received Pre Op Risk Assessment for patient  Last OV: VV 09/01/22  Placed in provider box

## 2022-11-23 ENCOUNTER — Encounter: Payer: Self-pay | Admitting: Physician Assistant

## 2022-11-23 ENCOUNTER — Other Ambulatory Visit: Payer: Self-pay

## 2022-11-23 MED ORDER — ALBUTEROL SULFATE HFA 108 (90 BASE) MCG/ACT IN AERS: 2.0000 | INHALATION_SPRAY | Freq: Four times a day (QID) | RESPIRATORY_TRACT | 1 refills | Status: DC | PRN

## 2022-11-24 ENCOUNTER — Telehealth: Payer: Self-pay

## 2022-11-24 NOTE — Telephone Encounter (Signed)
Called pt and lvm to advise receiving Pre-Op Assessment paperwork to be completed. Per PCP pt needs to schedule an appt to complete exam/labs. Pt advised to call to schedule appt.

## 2022-11-29 ENCOUNTER — Encounter: Payer: Self-pay | Admitting: Physician Assistant

## 2022-11-29 NOTE — Telephone Encounter (Signed)
Please see pt message and advise 

## 2022-12-01 ENCOUNTER — Ambulatory Visit (INDEPENDENT_AMBULATORY_CARE_PROVIDER_SITE_OTHER): Payer: Medicare HMO | Admitting: Physician Assistant

## 2022-12-01 ENCOUNTER — Other Ambulatory Visit: Payer: Self-pay

## 2022-12-01 ENCOUNTER — Other Ambulatory Visit: Payer: Medicare HMO

## 2022-12-01 VITALS — BP 130/80 | HR 75 | Temp 98.7°F | Ht 66.0 in | Wt 183.2 lb

## 2022-12-01 DIAGNOSIS — I2510 Atherosclerotic heart disease of native coronary artery without angina pectoris: Secondary | ICD-10-CM | POA: Diagnosis not present

## 2022-12-01 DIAGNOSIS — I4510 Unspecified right bundle-branch block: Secondary | ICD-10-CM

## 2022-12-01 DIAGNOSIS — Z01818 Encounter for other preprocedural examination: Secondary | ICD-10-CM

## 2022-12-01 DIAGNOSIS — J0110 Acute frontal sinusitis, unspecified: Secondary | ICD-10-CM | POA: Diagnosis not present

## 2022-12-01 DIAGNOSIS — R051 Acute cough: Secondary | ICD-10-CM | POA: Diagnosis not present

## 2022-12-01 MED ORDER — DOXYCYCLINE HYCLATE 100 MG PO TABS: 100.0000 mg | ORAL_TABLET | Freq: Two times a day (BID) | ORAL | 0 refills | Status: AC

## 2022-12-01 MED ORDER — BENZONATATE 100 MG PO CAPS: ORAL_CAPSULE | ORAL | 2 refills | Status: DC

## 2022-12-01 NOTE — Patient Instructions (Addendum)
Schedule for fasting lab appointment the first week of June if possible, please.   [phone] - Dr. Tenny Craw - call for preop cardiac assessment appointment   Take medications as directed.  Let me know how you're doing! Best wishes with surgery!

## 2022-12-01 NOTE — Progress Notes (Signed)
Subjective:    Patient ID: Tracy Mckenzie, female    DOB: August 19, 1955, 67 y.o.   MRN: [id number]  Chief Complaint  Patient presents with   Pre-op Exam    Pre op exam, pt is waiting till later date for labs; pt has cough for past week from allergies and drainage. Not been very productive.     HPI Patient is in today for pre-op exam for total knee arthroplasty scheduled with Dr. Lequita Halt on 02/14/23.   Most recent surgery: 01/29/2003 - lateral meniscus repair, no issues with anesthesia that she recalls with this procedure, but she did have a hard time waking up from general anesthesia post-hysterectomy.   No family history of issues with anesthesia.   Not physically active due to limited mobility from orthopedic pain.  Denies any chest pain, shortness of breath, fainting, dizziness, or other cardiopulmonary concerns.   Also complains of 1-2 week hx of sinus pain / congestion, dry cough; trying OTC treatments without relief.   Past Medical History:  Diagnosis Date   Arthritis    on meds   Bleeding ulcer    Cataract    recently dx-bilaterally   Elevated cholesterol    on meds   GERD (gastroesophageal reflux disease)    on meds   Hiatal hernia    High blood pressure    on meds   Kidney cysts    Liver cyst    Nerve pain    L sciatic pain radiating to R   Osteoporosis    reaction to Prolia=   Peptic ulcer disease    Seasonal allergies     Past Surgical History:  Procedure Laterality Date   APPENDECTOMY  1992   BREAST CYST ASPIRATION Left 2003   COLONOSCOPY  2014   [facility]   KNEE ARTHROSCOPY Right 04/12/2001   torn cartilage   LAPAROSCOPY  1981   for endometriosis   LEG SURGERY Right 11/23/2000   to remove screws   LEG SURGERY Right 02/08/2002   to remove rod   MENISCUS REPAIR  01/29/2003   TIBIA FRACTURE SURGERY Right 06/12/2000   Multiple surgeries   TONSILLECTOMY  1967   TOTAL ABDOMINAL HYSTERECTOMY  1992   WISDOM TOOTH EXTRACTION      Family  History  Problem Relation Age of Onset   Kidney disease Mother    COPD Mother    COPD Father    Breast cancer Sister 32   Breast cancer Maternal Aunt    Breast cancer Paternal Aunt    Diabetes Maternal Grandfather    Stroke Paternal Grandfather    Colon cancer Neg Hx    Colon polyps Neg Hx    Esophageal cancer Neg Hx    Rectal cancer Neg Hx    Stomach cancer Neg Hx     Social History   Tobacco Use   Smoking status: Never   Smokeless tobacco: Never  Vaping Use   Vaping Use: Never used  Substance Use Topics   Alcohol use: No   Drug use: No     Allergies  Allergen Reactions   Meperidine Palpitations and Anaphylaxis    flushed   Ibuprofen Other (See Comments)    Stomach ulcers.   Nsaids Other (See Comments)    Peptic ulcers   Simvastatin     Other reaction(s): Muscle Pain   Tape Other (See Comments)    "Sometimes removes skin"   Oxycodone-Acetaminophen Itching and Rash   Prolia [Denosumab] Rash   Vicodin [Hydrocodone-Acetaminophen] Itching  Review of Systems NEGATIVE UNLESS OTHERWISE INDICATED IN HPI      Objective:     BP 130/80 (BP Location: Left Arm)   Pulse 75   Temp 98.7 F (37.1 C) (Oral)   Ht 5\' 6"  (1.676 m)   Wt 183 lb 3.2 oz (83.1 kg)   SpO2 94%   BMI 29.57 kg/m   Wt Readings from Last 3 Encounters:  12/01/22 183 lb 3.2 oz (83.1 kg)  11/02/22 179 lb (81.2 kg)  09/01/22 179 lb (81.2 kg)    BP Readings from Last 3 Encounters:  12/01/22 130/80  08/12/22 130/76  06/17/22 132/60     Physical Exam Vitals and nursing note reviewed.  Constitutional:      Appearance: Normal appearance. She is normal weight. She is not toxic-appearing.     Comments: Using a cane  HENT:     Head: Normocephalic and atraumatic.     Nose: Congestion present.     Comments: Sniffling, sneezing Eyes:     Extraocular Movements: Extraocular movements intact.     Conjunctiva/sclera: Conjunctivae normal.     Pupils: Pupils are equal, round, and reactive to  light.  Cardiovascular:     Rate and Rhythm: Normal rate and regular rhythm.     Pulses: Normal pulses.     Heart sounds: Normal heart sounds.  Pulmonary:     Effort: Pulmonary effort is normal.     Breath sounds: Normal breath sounds.     Comments: Dry cough present Musculoskeletal:        General: Normal range of motion.     Cervical back: Normal range of motion and neck supple.     Lumbar back: No tenderness. Normal range of motion. Negative right straight leg raise test and negative left straight leg raise test.  Skin:    General: Skin is warm and dry.  Neurological:     General: No focal deficit present.     Mental Status: She is alert and oriented to person, place, and time.     Cranial Nerves: No cranial nerve deficit.     Motor: No weakness.  Psychiatric:        Mood and Affect: Mood normal.        Behavior: Behavior normal.        Assessment & Plan:  Pre-operative exam -     EKG 12-Lead -     CBC with Differential/Platelet; Future -     Basic metabolic panel; Future -     Hemoglobin A1c; Future  Right bundle branch block (RBBB)  Mild CAD  Acute frontal sinusitis, recurrence not specified  Acute cough  Other orders -     Doxycycline Hyclate; Take 1 tablet (100 mg total) by mouth 2 (two) times daily for 7 days.  Dispense: 14 tablet; Refill: 0 -     Benzonatate; TAKE 1 TO 2 CAPSULES BY MOUTH TWICE A DAY AS NEEDED FOR COUGH  Dispense: 30 capsule; Refill: 2   RCRI Risk score is 1 point for CAD (noted on CT Cardiac Score)  Points 1: Class II Low (0.9% complications) Complications predicted by above scoring: Myocardial Infarction Pulmonary Embolism Ventricular Fibrillation Cardiac Arrest Complete Heart Block  I personally reviewed her EKG from today and compared to 11/17/2020. No changes noted. RBBB is still present.   Given patient's RCRI score, age, and mild CAD history, appreciate consult / exam with cardiology prior to her procedure. She will call Dr.  Tenny Craw & schedule.   Fasting  labs to be completed next month.  Treated for sinusits and cough today with tessalon perles and doxycycline to take as directed. She will cont regular conservative care as well.    Regular f/up with me every 6 months.   Ximena Todaro M Jerrard Bradburn, PA-C

## 2022-12-02 ENCOUNTER — Encounter: Payer: Self-pay | Admitting: Physician Assistant

## 2022-12-02 NOTE — Telephone Encounter (Signed)
Please see pt msg and advise 

## 2022-12-05 ENCOUNTER — Encounter: Payer: Self-pay | Admitting: Physician Assistant

## 2022-12-06 ENCOUNTER — Other Ambulatory Visit: Payer: Self-pay | Admitting: Physician Assistant

## 2022-12-06 MED ORDER — PREDNISONE 20 MG PO TABS: 20.0000 mg | ORAL_TABLET | Freq: Two times a day (BID) | ORAL | 0 refills | Status: AC

## 2022-12-06 NOTE — Telephone Encounter (Signed)
Please see pt msg and advise 

## 2022-12-13 ENCOUNTER — Telehealth: Payer: Self-pay | Admitting: *Deleted

## 2022-12-13 NOTE — Telephone Encounter (Signed)
   Name: Tracy Mckenzie  DOB: 1956/06/18  MRN: [id number]  Primary Cardiologist: None  Chart reviewed as part of pre-operative protocol coverage. Because of Tracy Mckenzie past medical history and time since last visit, she will require a follow-up in-office visit in order to better assess preoperative cardiovascular risk.  Pre-op covering staff: - Please schedule appointment and call patient to inform them. If patient already had an upcoming appointment within acceptable timeframe, please add "pre-op clearance" to the appointment notes so provider is aware. - Please contact requesting surgeon's office via preferred method (i.e, phone, fax) to inform them of need for appointment prior to surgery.  No medications indicated as needing held.  Sharlene Dory, PA-C  12/13/2022, 3:55 PM

## 2022-12-13 NOTE — Telephone Encounter (Signed)
Patient has already been scheduled to see Jari Favre, PA-C on 5/22 at 8/25 am. I will fax the pre-op clearance recommendations to requesting provider's office.

## 2022-12-13 NOTE — Telephone Encounter (Signed)
   Pre-operative Risk Assessment    Patient Name: Tracy Mckenzie  DOB: 10/24/1955 MRN: [id number]      Request for Surgical Clearance    Procedure:   RIGHT TOTAL KNEE ARTHROPLASTY  Date of Surgery:  Clearance 02/14/23                                 Surgeon:  DR. Ollen Gross Surgeon's Group or Practice Name:  [facility] Phone number:  [phone] ATTN: Aida Raider Fax number:  [phone]   Type of Clearance Requested:   - Medical ; NO MEDICATIONS LISTED AS NEEDING TO BE HELD   Type of Anesthesia:   CHOICE   Additional requests/questions:    Elpidio Anis   12/13/2022, 2:22 PM

## 2022-12-21 NOTE — Progress Notes (Unsigned)
Office Visit    Patient Name: Tracy Mckenzie Date of Encounter: 12/22/2022  PCP:  Bethanie Dicker   [facility]  Cardiologist:  Dietrich Pates, MD  Advanced Practice Provider:  No care team member to display Electrophysiologist:  None   HPI    Tracy Mckenzie is a 67 y.o. female with past medical history of hypertension hyperlipidemia presents today for preop clearance.  She had a stress echo at [facility] in 2011 which was reportedly negative.  Patient was seen at [facility], ED on 4/16 for evaluation of chest pain.  She described the pain as under her left breast and left parasternal.  Pain was not sharp or dull.  Last about 3 hours.  Not positional.  She did get some shortness of breath with activity.  She attributes it from not being very physically active.  Since the ER visit no further spells of chest pain.  Has not been very active lately.  Limited by leg injury.  Has had 5 surgeries in the past.  Still with significant DJD.  Today, she presents for preop clearance.  She states that even if there is a slight incline her legs and back hurt so she cannot do this.  Not doing much walking at all.  She has gained some weight due to the inactivity.  She states that she is frequently out of breath and sometimes can just begin conversation and loses her breath.  She thinks it is due to allergies.  She has been to a few different specialist.  She takes albuterol as needed and sometimes this helps.  Last 2 months she feels like her shortness of breath has gotten worse.  She does barely meet 4 METS on the DASI but we discussed updating an echocardiogram before clearance can be provided.  Reports no chest pain, pressure, or tightness. No edema, orthopnea, PND. Reports no palpitations.   Past Medical History    Past Medical History:  Diagnosis Date   Arthritis    on meds   Bleeding ulcer    Cataract    recently dx-bilaterally   Elevated cholesterol    on meds    GERD (gastroesophageal reflux disease)    on meds   Hiatal hernia    High blood pressure    on meds   Kidney cysts    Liver cyst    Nerve pain    L sciatic pain radiating to R   Osteoporosis    reaction to Prolia=   Peptic ulcer disease    Seasonal allergies    Past Surgical History:  Procedure Laterality Date   APPENDECTOMY  1992   BREAST CYST ASPIRATION Left 2003   COLONOSCOPY  2014   [facility]   KNEE ARTHROSCOPY Right 04/12/2001   torn cartilage   LAPAROSCOPY  1981   for endometriosis   LEG SURGERY Right 11/23/2000   to remove screws   LEG SURGERY Right 02/08/2002   to remove rod   MENISCUS REPAIR  01/29/2003   TIBIA FRACTURE SURGERY Right 06/12/2000   Multiple surgeries   TONSILLECTOMY  1967   TOTAL ABDOMINAL HYSTERECTOMY  1992   WISDOM TOOTH EXTRACTION      Allergies  Allergies  Allergen Reactions   Meperidine Palpitations and Anaphylaxis    flushed   Ibuprofen Other (See Comments)    Stomach ulcers.   Nsaids Other (See Comments)    Peptic ulcers   Simvastatin     Other reaction(s): Muscle Pain  Tape Other (See Comments)    "Sometimes removes skin"   Oxycodone-Acetaminophen Itching and Rash   Prolia [Denosumab] Rash   Vicodin [Hydrocodone-Acetaminophen] Itching    EKGs/Labs/Other Studies Reviewed:   The following studies were reviewed today: Cardiac Studies & Procedures          CT SCANS  CT CARDIAC SCORING (SELF PAY ONLY) 12/09/2020  Addendum 12/09/2020  4:44 PM ADDENDUM REPORT: 12/09/2020 16:42  CLINICAL DATA:  Cardiovascular Disease Risk stratification  EXAM: Coronary Calcium Score  TECHNIQUE: A gated, non-contrast computed tomography scan of the heart was performed using 3mm slice thickness. Axial images were analyzed on a dedicated workstation. Calcium scoring of the coronary arteries was performed using the Agatston method.  FINDINGS: Coronary arteries: Normal origins.  Coronary Calcium Score:  Left main:  5.6  Left anterior descending artery: 24.3  Left circumflex artery: 0  Right coronary artery: 0  Total: 29.9  Percentile: 66th  Pericardium: Normal.  Ascending Aorta: Normal caliber. Scattered calcifications in the descending aorta and aortic arch.  Non-cardiac: See separate report from [facility].  IMPRESSION: Coronary calcium score of 29.9. This was 66th percentile for age-, race-, and sex-matched controls.  RECOMMENDATIONS: Coronary artery calcium (CAC) score is a strong predictor of incident coronary heart disease (CHD) and provides predictive information beyond traditional risk factors. CAC scoring is reasonable to use in the decision to withhold, postpone, or initiate statin therapy in intermediate-risk or selected borderline-risk asymptomatic adults (age 53-75 years and LDL-C >=70 to <190 mg/dL) who do not have diabetes or established atherosclerotic cardiovascular disease (ASCVD).* In intermediate-risk (10-year ASCVD risk >=7.5% to <20%) adults or selected borderline-risk (10-year ASCVD risk >=5% to <7.5%) adults in whom a CAC score is measured for the purpose of making a treatment decision the following recommendations have been made:  If CAC=0, it is reasonable to withhold statin therapy and reassess in 5 to 10 years, as long as higher risk conditions are absent (diabetes mellitus, family history of premature CHD in first degree relatives (males <55 years; females <65 years), cigarette smoking, or LDL >=190 mg/dL).  If CAC is 1 to 99, it is reasonable to initiate statin therapy for patients >=72 years of age.  If CAC is >=100 or >=75th percentile, it is reasonable to initiate statin therapy at any age.  Cardiology referral should be considered for patients with CAC scores >=400 or >=75th percentile.  *2018 AHA/ACC/AACVPR/AAPA/ABC/ACPM/ADA/AGS/APhA/ASPC/NLA/PCNA Guideline on the Management of Blood Cholesterol: A Report of the American College  of Cardiology/American Heart Association Task Force on Clinical Practice Guidelines. J Am Coll Cardiol. 2019;73(24):3168-3209.  Armanda Magic, MD   Electronically Signed By: Armanda Magic On: 12/09/2020 16:42  Narrative EXAM: OVER-READ INTERPRETATION  CT CHEST  The following report is an over-read performed by radiologist Dr. Jeronimo Greaves of [facility] on 12/09/2020. This over-read does not include interpretation of cardiac or coronary anatomy or pathology. The calcium score interpretation by the cardiologist is attached.  COMPARISON:  11/14/2020 chest radiograph.  FINDINGS: Vascular: Aortic atherosclerosis.  Mediastinum/Nodes: No imaged thoracic adenopathy.  Lungs/Pleura: No pleural fluid. Right base subsegmental atelectasis or scarring.  Upper Abdomen: Moderate to marked right hemidiaphragm elevation. Normal imaged portions of the liver, spleen, stomach.  Musculoskeletal: Upper thoracic spondylosis.  IMPRESSION: 1.  No acute findings in the imaged extracardiac chest. 2.  Aortic Atherosclerosis (ICD10-I70.0). 3. Moderate to marked right hemidiaphragm elevation  Electronically Signed: By: Jeronimo Greaves M.D. On: 12/09/2020 15:22  EKG:  EKG is  ordered today.  The ekg ordered today demonstrates normal sinus rhythm, rate 72 bpm, RBBB (old)  Recent Labs: 08/12/2022: ALT 11; BUN 18; Creatinine, Ser 1.04; Hemoglobin 13.4; Platelets 379.0; Potassium 5.3; Sodium 147  Recent Lipid Panel    Component Value Date/Time   CHOL 213 (H) 08/12/2022 0854   TRIG 116.0 08/12/2022 0854   HDL 55.50 08/12/2022 0854   CHOLHDL 4 08/12/2022 0854   VLDL 23.2 08/12/2022 0854   LDLCALC 135 (H) 08/12/2022 0854     Home Medications   Current Meds  Medication Sig   albuterol (VENTOLIN HFA) 108 (90 Base) MCG/ACT inhaler Inhale 2 puffs into the lungs every 6 (six) hours as needed for wheezing or shortness of breath.   AREXVY 120 MCG/0.5ML injection    azelastine  (ASTELIN) 0.1 % nasal spray Place 1 spray into both nostrils 2 (two) times daily as needed for rhinitis or allergies.   baclofen (LIORESAL) 10 MG tablet TAKE ONE TABLET BY MOUTH THREE TIMES A DAY (Patient taking differently: Take 10 mg by mouth as needed for muscle spasms.)   benzonatate (TESSALON) 100 MG capsule TAKE 1 TO 2 CAPSULES BY MOUTH TWICE A DAY AS NEEDED FOR COUGH   BORON PO Take 10 mg by mouth daily at 6 (six) AM. BIO BORON   Cholecalciferol (VITAMIN D3 PO) Take 10,000 Int'l Units/day by mouth daily at 6 (six) AM.   Coenzyme Q10-Fish Oil-Vit E (CO-Q 10 OMEGA-3 FISH OIL PO) Take 1 capsule by mouth daily at 6 (six) AM.   FLUAD QUADRIVALENT 0.5 ML injection Inject 0.5 mLs into the muscle once.   fluconazole (DIFLUCAN) 150 MG tablet Take 1 tablet (150 mg total) by mouth daily.   gabapentin (NEURONTIN) 300 MG capsule TAKE ONE CAPSULE BY MOUTH TWICE A DAY (Patient taking differently: Take 300 mg by mouth as needed.)   hydrochlorothiazide (MICROZIDE) 12.5 MG capsule TAKE ONE CAPSULE BY MOUTH DAILY   hydrOXYzine (ATARAX) 10 MG tablet Take 1 tablet (10 mg total) by mouth every 6 (six) hours.   Loratadine (CLARITIN PO) Take 10 mg by mouth daily at 6 (six) AM.   losartan (COZAAR) 50 MG tablet TAKE ONE TABLET BY MOUTH TWICE A DAY FOR BLOOD PRESSURE   magnesium 30 MG tablet Take 200 mg by mouth daily.   methocarbamol (ROBAXIN) 500 MG tablet Take 1 tablet (500 mg total) by mouth 3 (three) times daily as needed for muscle spasms.   metoprolol tartrate (LOPRESSOR) 25 MG tablet Take 1 tablet (25 mg total) by mouth 2 (two) times daily.   montelukast (SINGULAIR) 10 MG tablet TAKE ONE TABLET BY MOUTH AT BEDTIME   nitroGLYCERIN (NITROSTAT) 0.4 MG SL tablet Place 1 tablet (0.4 mg total) under the tongue every 5 (five) minutes as needed for chest pain.   omeprazole (PRILOSEC) 40 MG capsule TAKE 1 CAPSULE BY MOUTH DAILY 30 MINUTES BEFORE BREAKFAST   Spacer/Aero-Holding Chambers (PRO COMFORT SPACER ADULT) MISC  1 Device by Does not apply route as needed.   traMADol-acetaminophen (ULTRACET) 37.5-325 MG tablet Take 1 tablet by mouth every 8 (eight) hours as needed.   TURMERIC PO Take 1 capsule by mouth daily at 6 (six) AM.   zinc gluconate 50 MG tablet Take 50 mg by mouth daily.     Review of Systems      All other systems reviewed and are otherwise negative except as noted above.  Physical Exam    VS:  BP 124/70   Pulse  72   Ht 5\' 6"  (1.676 m)   Wt 182 lb 6.4 oz (82.7 kg)   SpO2 96%   BMI 29.44 kg/m  , BMI Body mass index is 29.44 kg/m.  Wt Readings from Last 3 Encounters:  12/22/22 182 lb 6.4 oz (82.7 kg)  12/01/22 183 lb 3.2 oz (83.1 kg)  11/02/22 179 lb (81.2 kg)     GEN: Well nourished, well developed, in no acute distress. HEENT: normal. Neck: Supple, no JVD, carotid bruits, or masses. Cardiac: RRR, no murmurs, rubs, or gallops. No clubbing, cyanosis, edema.  Radials/PT 2+ and equal bilaterally.  Respiratory:  Respirations regular and unlabored, clear to auscultation bilaterally. GI: Soft, nontender, nondistended. MS: No deformity or atrophy. Skin: Warm and dry, no rash. Neuro:  Strength and sensation are intact. Psych: Normal affect.  Assessment & Plan    Preop Clearance  Ms. Lamaster's perioperative risk of a major cardiac event is 0.4% according to the Revised Cardiac Risk Index (RCRI).  Therefore, she is at low risk for perioperative complications.   Her functional capacity is fair at 4.3 METs according to the [facility] Activity Status Index (DASI). Recommendations: The patient requires an echocardiogram before a disposition can be made regarding surgical risk.                 Chest pain (atypical) -no chest pains other than MS  -Continue current medications which include HCTZ 12.5 mg daily, losartan 50 mg twice a day, magnesium supplementation, Lopressor 25 mg twice a day, nitroglycerin as needed -She is taking a few supplements and brought in the nutrition labels.  These  include relief factor which is a fish oil supplement, bone strength which has magnesium, D3, K2, and calcium, as well as a collagen complex  Hyperlipidemia -LDL 135, will need an updated lipid panel at next visit -she has a statin intolerance        Disposition: Follow up 6 months with Dietrich Pates, MD or APP.  Signed, Sharlene Dory, PA-C 12/22/2022, 9:52 AM [facility]

## 2022-12-22 ENCOUNTER — Ambulatory Visit: Payer: Medicare HMO | Attending: Physician Assistant | Admitting: Physician Assistant

## 2022-12-22 ENCOUNTER — Encounter: Payer: Self-pay | Admitting: Physician Assistant

## 2022-12-22 VITALS — BP 124/70 | HR 72 | Ht 66.0 in | Wt 182.4 lb

## 2022-12-22 DIAGNOSIS — E785 Hyperlipidemia, unspecified: Secondary | ICD-10-CM | POA: Diagnosis not present

## 2022-12-22 DIAGNOSIS — Z01810 Encounter for preprocedural cardiovascular examination: Secondary | ICD-10-CM

## 2022-12-22 DIAGNOSIS — R0789 Other chest pain: Secondary | ICD-10-CM

## 2022-12-22 DIAGNOSIS — R0602 Shortness of breath: Secondary | ICD-10-CM

## 2022-12-22 DIAGNOSIS — R0609 Other forms of dyspnea: Secondary | ICD-10-CM | POA: Diagnosis not present

## 2022-12-22 NOTE — Patient Instructions (Addendum)
Medication Instructions:  Your physician recommends that you continue on your current medications as directed. Please refer to the Current Medication list given to you today.  *If you need a refill on your cardiac medications before your next appointment, please call your pharmacy*  Lab Work: None ordered If you have labs (blood work) drawn today and your tests are completely normal, you will receive your results only by: MyChart Message (if you have MyChart) OR A paper copy in the mail If you have any lab test that is abnormal or we need to change your treatment, we will call you to review the results.  Testing/procedures: Your physician has requested that you have an echocardiogram. Echocardiography is a painless test that uses sound waves to create images of your heart. It provides your doctor with information about the size and shape of your heart and how well your heart's chambers and valves are working. This procedure takes approximately one hour. There are no restrictions for this procedure. Please do NOT wear cologne, perfume, aftershave, or lotions (deodorant is allowed). Please arrive 15 minutes prior to your appointment time.   Follow-Up: At [facility], you and your health needs are our priority.  As part of our continuing mission to provide you with exceptional heart care, we have created designated Provider Care Teams.  These Care Teams include your primary Cardiologist (physician) and Advanced Practice Providers (APPs -  Physician Assistants and Nurse Practitioners) who all work together to provide you with the care you need, when you need it.  Your next appointment:   6 month(s)  Provider:   Dietrich Pates, MD   Low-Sodium Eating Plan Sodium, which is an element that makes up salt, helps you maintain a healthy balance of fluids in your body. Too much sodium can increase your blood pressure and cause fluid and waste to be held in your body. Your health care provider or  dietitian may recommend following this plan if you have high blood pressure (hypertension), kidney disease, liver disease, or heart failure. Eating less sodium can help lower your blood pressure, reduce swelling, and protect your heart, liver, and kidneys. What are tips for following this plan? Reading food labels The Nutrition Facts label lists the amount of sodium in one serving of the food. If you eat more than one serving, you must multiply the listed amount of sodium by the number of servings. Choose foods with less than 140 mg of sodium per serving. Avoid foods with 300 mg of sodium or more per serving. Shopping  Look for lower-sodium products, often labeled as "low-sodium" or "no salt added." Always check the sodium content, even if foods are labeled as "unsalted" or "no salt added." Buy fresh foods. Avoid canned foods and pre-made or frozen meals. Avoid canned, cured, or processed meats. Buy breads that have less than 80 mg of sodium per slice. Cooking  Eat more home-cooked food and less restaurant, buffet, and fast food. Avoid adding salt when cooking. Use salt-free seasonings or herbs instead of table salt or sea salt. Check with your health care provider or pharmacist before using salt substitutes. Cook with plant-based oils, such as canola, sunflower, or olive oil. Meal planning When eating at a restaurant, ask that your food be prepared with less salt or no salt, if possible. Avoid dishes labeled as brined, pickled, cured, smoked, or made with soy sauce, miso, or teriyaki sauce. Avoid foods that contain MSG (monosodium glutamate). MSG is sometimes added to [place] food, bouillon, and  some canned foods. Make meals that can be grilled, baked, poached, roasted, or steamed. These are generally made with less sodium. General information Most people on this plan should limit their sodium intake to 1,500-2,000 mg (milligrams) of sodium each day. What foods should I eat? Fruits Fresh,  frozen, or canned fruit. Fruit juice. Vegetables Fresh or frozen vegetables. "No salt added" canned vegetables. "No salt added" tomato sauce and paste. Low-sodium or reduced-sodium tomato and vegetable juice. Grains Low-sodium cereals, including oats, puffed wheat and rice, and shredded wheat. Low-sodium crackers. Unsalted rice. Unsalted pasta. Low-sodium bread. Whole-grain breads and whole-grain pasta. Meats and other proteins Fresh or frozen (no salt added) meat, poultry, seafood, and fish. Low-sodium canned tuna and salmon. Unsalted nuts. Dried peas, beans, and lentils without added salt. Unsalted canned beans. Eggs. Unsalted nut butters. Dairy Milk. Soy milk. Cheese that is naturally low in sodium, such as ricotta cheese, fresh mozzarella, or Swiss cheese. Low-sodium or reduced-sodium cheese. Cream cheese. Yogurt. Seasonings and condiments Fresh and dried herbs and spices. Salt-free seasonings. Low-sodium mustard and ketchup. Sodium-free salad dressing. Sodium-free light mayonnaise. Fresh or refrigerated horseradish. Lemon juice. Vinegar. Other foods Homemade, reduced-sodium, or low-sodium soups. Unsalted popcorn and pretzels. Low-salt or salt-free chips. The items listed above may not be a complete list of foods and beverages you can eat. Contact a dietitian for more information. What foods should I avoid? Vegetables Sauerkraut, pickled vegetables, and relishes. Olives. [place] fries. Onion rings. Regular canned vegetables (not low-sodium or reduced-sodium). Regular canned tomato sauce and paste (not low-sodium or reduced-sodium). Regular tomato and vegetable juice (not low-sodium or reduced-sodium). Frozen vegetables in sauces. Grains Instant hot cereals. Bread stuffing, pancake, and biscuit mixes. Croutons. Seasoned rice or pasta mixes. Noodle soup cups. Boxed or frozen macaroni and cheese. Regular salted crackers. Self-rising flour. Meats and other proteins Meat or fish that is salted,  canned, smoked, spiced, or pickled. Precooked or cured meat, such as sausages or meat loaves. Tomasa Blase. Ham. Pepperoni. Hot dogs. Corned beef. Chipped beef. Salt pork. Jerky. Pickled herring. Anchovies and sardines. Regular canned tuna. Salted nuts. Dairy Processed cheese and cheese spreads. Hard cheeses. Cheese curds. Blue cheese. Feta cheese. String cheese. Regular cottage cheese. Buttermilk. Canned milk. Fats and oils Salted butter. Regular margarine. Ghee. Bacon fat. Seasonings and condiments Onion salt, garlic salt, seasoned salt, table salt, and sea salt. Canned and packaged gravies. Worcestershire sauce. Tartar sauce. Barbecue sauce. Teriyaki sauce. Soy sauce, including reduced-sodium. Steak sauce. Fish sauce. Oyster sauce. Cocktail sauce. Horseradish that you find on the shelf. Regular ketchup and mustard. Meat flavorings and tenderizers. Bouillon cubes. Hot sauce. Pre-made or packaged marinades. Pre-made or packaged taco seasonings. Relishes. Regular salad dressings. Salsa. Other foods Salted popcorn and pretzels. Corn chips and puffs. Potato and tortilla chips. Canned or dried soups. Pizza. Frozen entrees and pot pies. The items listed above may not be a complete list of foods and beverages you should avoid. Contact a dietitian for more information. Summary Eating less sodium can help lower your blood pressure, reduce swelling, and protect your heart, liver, and kidneys. Most people on this plan should limit their sodium intake to 1,500-2,000 mg (milligrams) of sodium each day. Canned, boxed, and frozen foods are high in sodium. Restaurant foods, fast foods, and pizza are also very high in sodium. You also get sodium by adding salt to food. Try to cook at home, eat more fresh fruits and vegetables, and eat less fast food and canned, processed, or prepared foods. This information is  not intended to replace advice given to you by your health care provider. Make sure you discuss any questions you  have with your health care provider. Document Revised: 06/25/2019 Document Reviewed: 06/20/2019 Elsevier Patient Education  2023 [employer]  Heart-Healthy Eating Plan Many factors influence your heart health, including eating and exercise habits. Heart health is also called coronary health. Coronary risk increases with abnormal blood fat (lipid) levels. A heart-healthy eating plan includes limiting unhealthy fats, increasing healthy fats, limiting salt (sodium) intake, and making other diet and lifestyle changes. What is my plan? Your health care provider may recommend that: You limit your fat intake to _________% or less of your total calories each day. You limit your saturated fat intake to _________% or less of your total calories each day. You limit the amount of cholesterol in your diet to less than _________ mg per day. You limit the amount of sodium in your diet to less than _________ mg per day. What are tips for following this plan? Cooking Cook foods using methods other than frying. Baking, boiling, grilling, and broiling are all good options. Other ways to reduce fat include: Removing the skin from poultry. Removing all visible fats from meats. Steaming vegetables in water or broth. Meal planning  At meals, imagine dividing your plate into fourths: Fill one-half of your plate with vegetables and green salads. Fill one-fourth of your plate with whole grains. Fill one-fourth of your plate with lean protein foods. Eat 2-4 cups of vegetables per day. One cup of vegetables equals 1 cup (91 g) broccoli or cauliflower florets, 2 medium carrots, 1 large bell pepper, 1 large sweet potato, 1 large tomato, 1 medium white potato, 2 cups (150 g) raw leafy greens. Eat 1-2 cups of fruit per day. One cup of fruit equals 1 small apple, 1 large banana, 1 cup (237 g) mixed fruit, 1 large orange,  cup (82 g) dried fruit, 1 cup (240 mL) 100% fruit juice. Eat more foods that contain soluble  fiber. Examples include apples, broccoli, carrots, beans, peas, and barley. Aim to get 25-30 g of fiber per day. Increase your consumption of legumes, nuts, and seeds to 4-5 servings per week. One serving of dried beans or legumes equals  cup (90 g) cooked, 1 serving of nuts is  oz (12 almonds, 24 pistachios, or 7 walnut halves), and 1 serving of seeds equals  oz (8 g). Fats Choose healthy fats more often. Choose monounsaturated and polyunsaturated fats, such as olive and canola oils, avocado oil, flaxseeds, walnuts, almonds, and seeds. Eat more omega-3 fats. Choose salmon, mackerel, sardines, tuna, flaxseed oil, and ground flaxseeds. Aim to eat fish at least 2 times each week. Check food labels carefully to identify foods with trans fats or high amounts of saturated fat. Limit saturated fats. These are found in animal products, such as meats, butter, and cream. Plant sources of saturated fats include palm oil, palm kernel oil, and coconut oil. Avoid foods with partially hydrogenated oils in them. These contain trans fats. Examples are stick margarine, some tub margarines, cookies, crackers, and other baked goods. Avoid fried foods. General information Eat more home-cooked food and less restaurant, buffet, and fast food. Limit or avoid alcohol. Limit foods that are high in added sugar and simple starches such as foods made using white refined flour (white breads, pastries, sweets). Lose weight if you are overweight. Losing just 5-10% of your body weight can help your overall health and prevent diseases such as diabetes and  heart disease. Monitor your sodium intake, especially if you have high blood pressure. Talk with your health care provider about your sodium intake. Try to incorporate more vegetarian meals weekly. What foods should I eat? Fruits All fresh, canned (in natural juice), or frozen fruits. Vegetables Fresh or frozen vegetables (raw, steamed, roasted, or grilled). Green  salads. Grains Most grains. Choose whole wheat and whole grains most of the time. Rice and pasta, including brown rice and pastas made with whole wheat. Meats and other proteins Lean, well-trimmed beef, veal, pork, and lamb. Chicken and [place] without skin. All fish and shellfish. Wild duck, rabbit, pheasant, and venison. Egg whites or low-cholesterol egg substitutes. Dried beans, peas, lentils, and tofu. Seeds and most nuts. Dairy Low-fat or nonfat cheeses, including ricotta and mozzarella. Skim or 1% milk (liquid, powdered, or evaporated). Buttermilk made with low-fat milk. Nonfat or low-fat yogurt. Fats and oils Non-hydrogenated (trans-free) margarines. Vegetable oils, including soybean, sesame, sunflower, olive, avocado, peanut, safflower, corn, canola, and cottonseed. Salad dressings or mayonnaise made with a vegetable oil. Beverages Water (mineral or sparkling). Coffee and tea. Unsweetened ice tea. Diet beverages. Sweets and desserts Sherbet, gelatin, and fruit ice. Small amounts of dark chocolate. Limit all sweets and desserts. Seasonings and condiments All seasonings and condiments. The items listed above may not be a complete list of foods and beverages you can eat. Contact a dietitian for more options. What foods should I avoid? Fruits Canned fruit in heavy syrup. Fruit in cream or butter sauce. Fried fruit. Limit coconut. Vegetables Vegetables cooked in cheese, cream, or butter sauce. Fried vegetables. Grains Breads made with saturated or trans fats, oils, or whole milk. Croissants. Sweet rolls. Donuts. High-fat crackers, such as cheese crackers and chips. Meats and other proteins Fatty meats, such as hot dogs, ribs, sausage, bacon, rib-eye roast or steak. High-fat deli meats, such as salami and bologna. Caviar. Domestic duck and goose. Organ meats, such as liver. Dairy Cream, sour cream, cream cheese, and creamed cottage cheese. Whole-milk cheeses. Whole or 2% milk (liquid,  evaporated, or condensed). Whole buttermilk. Cream sauce or high-fat cheese sauce. Whole-milk yogurt. Fats and oils Meat fat, or shortening. Cocoa butter, hydrogenated oils, palm oil, coconut oil, palm kernel oil. Solid fats and shortenings, including bacon fat, salt pork, lard, and butter. Nondairy cream substitutes. Salad dressings with cheese or sour cream. Beverages Regular sodas and any drinks with added sugar. Sweets and desserts Frosting. Pudding. Cookies. Cakes. Pies. Milk chocolate or white chocolate. Buttered syrups. Full-fat ice cream or ice cream drinks. The items listed above may not be a complete list of foods and beverages to avoid. Contact a dietitian for more information. Summary Heart-healthy meal planning includes limiting unhealthy fats, increasing healthy fats, limiting salt (sodium) intake and making other diet and lifestyle changes. Lose weight if you are overweight. Losing just 5-10% of your body weight can help your overall health and prevent diseases such as diabetes and heart disease. Focus on eating a balance of foods, including fruits and vegetables, low-fat or nonfat dairy, lean protein, nuts and legumes, whole grains, and heart-healthy oils and fats. This information is not intended to replace advice given to you by your health care provider. Make sure you discuss any questions you have with your health care provider. Document Revised: 08/24/2021 Document Reviewed: 08/24/2021 Elsevier Patient Education  2023 [employer].

## 2022-12-24 ENCOUNTER — Ambulatory Visit (HOSPITAL_COMMUNITY): Payer: Medicare HMO | Attending: Physician Assistant

## 2022-12-24 DIAGNOSIS — Z01810 Encounter for preprocedural cardiovascular examination: Secondary | ICD-10-CM | POA: Insufficient documentation

## 2022-12-24 DIAGNOSIS — I3481 Nonrheumatic mitral (valve) annulus calcification: Secondary | ICD-10-CM

## 2022-12-24 DIAGNOSIS — R0602 Shortness of breath: Secondary | ICD-10-CM | POA: Diagnosis not present

## 2022-12-24 DIAGNOSIS — I5030 Unspecified diastolic (congestive) heart failure: Secondary | ICD-10-CM | POA: Diagnosis not present

## 2022-12-24 DIAGNOSIS — R0609 Other forms of dyspnea: Secondary | ICD-10-CM | POA: Diagnosis not present

## 2022-12-24 LAB — ECHOCARDIOGRAM COMPLETE
Area-P 1/2: 3.32 cm2
S' Lateral: 2.5 cm

## 2022-12-27 ENCOUNTER — Encounter: Payer: Self-pay | Admitting: Physician Assistant

## 2022-12-28 ENCOUNTER — Other Ambulatory Visit: Payer: Self-pay

## 2022-12-28 DIAGNOSIS — R051 Acute cough: Secondary | ICD-10-CM

## 2022-12-28 NOTE — Telephone Encounter (Signed)
Please see pt msg and advise 

## 2022-12-28 NOTE — Telephone Encounter (Signed)
Please see pt msg and advise if you would like her to schedule an appointment.

## 2022-12-29 ENCOUNTER — Other Ambulatory Visit: Payer: Self-pay

## 2022-12-29 DIAGNOSIS — K7689 Other specified diseases of liver: Secondary | ICD-10-CM

## 2023-01-03 ENCOUNTER — Other Ambulatory Visit (INDEPENDENT_AMBULATORY_CARE_PROVIDER_SITE_OTHER): Payer: Medicare HMO

## 2023-01-03 DIAGNOSIS — Z01818 Encounter for other preprocedural examination: Secondary | ICD-10-CM | POA: Diagnosis not present

## 2023-01-03 DIAGNOSIS — E875 Hyperkalemia: Secondary | ICD-10-CM | POA: Diagnosis not present

## 2023-01-03 DIAGNOSIS — E870 Hyperosmolality and hypernatremia: Secondary | ICD-10-CM

## 2023-01-03 LAB — CBC WITH DIFFERENTIAL/PLATELET
Basophils Absolute: 0.1 10*3/uL (ref 0.0–0.1)
Basophils Relative: 0.5 % (ref 0.0–3.0)
Eosinophils Absolute: 0.9 10*3/uL — ABNORMAL HIGH (ref 0.0–0.7)
Eosinophils Relative: 7.5 % — ABNORMAL HIGH (ref 0.0–5.0)
HCT: 40.5 % (ref 36.0–46.0)
Hemoglobin: 13.2 g/dL (ref 12.0–15.0)
Lymphocytes Relative: 18.8 % (ref 12.0–46.0)
Lymphs Abs: 2.3 10*3/uL (ref 0.7–4.0)
MCHC: 32.5 g/dL (ref 30.0–36.0)
MCV: 89.9 fl (ref 78.0–100.0)
Monocytes Absolute: 0.6 10*3/uL (ref 0.1–1.0)
Monocytes Relative: 4.7 % (ref 3.0–12.0)
Neutro Abs: 8.6 10*3/uL — ABNORMAL HIGH (ref 1.4–7.7)
Neutrophils Relative %: 68.5 % (ref 43.0–77.0)
Platelets: 389 10*3/uL (ref 150.0–400.0)
RBC: 4.51 Mil/uL (ref 3.87–5.11)
RDW: 14 % (ref 11.5–15.5)
WBC: 12.5 10*3/uL — ABNORMAL HIGH (ref 4.0–10.5)

## 2023-01-03 LAB — COMPREHENSIVE METABOLIC PANEL
ALT: 15 U/L (ref 0–35)
AST: 15 U/L (ref 0–37)
Albumin: 4.1 g/dL (ref 3.5–5.2)
Alkaline Phosphatase: 127 U/L — ABNORMAL HIGH (ref 39–117)
BUN: 19 mg/dL (ref 6–23)
CO2: 30 mEq/L (ref 19–32)
Calcium: 9.6 mg/dL (ref 8.4–10.5)
Chloride: 102 mEq/L (ref 96–112)
Creatinine, Ser: 1 mg/dL (ref 0.40–1.20)
GFR: 57.69 mL/min — ABNORMAL LOW (ref >60.00)
Glucose, Bld: 99 mg/dL (ref 70–99)
Potassium: 4.2 mEq/L (ref 3.5–5.1)
Sodium: 141 mEq/L (ref 135–145)
Total Bilirubin: 0.4 mg/dL (ref 0.2–1.2)
Total Protein: 6.6 g/dL (ref 6.0–8.3)

## 2023-01-03 LAB — HEMOGLOBIN A1C: Hgb A1c MFr Bld: 5.8 % (ref 4.6–6.5)

## 2023-01-04 ENCOUNTER — Ambulatory Visit (HOSPITAL_BASED_OUTPATIENT_CLINIC_OR_DEPARTMENT_OTHER)
Admission: RE | Admit: 2023-01-04 | Discharge: 2023-01-04 | Disposition: A | Discharge status: Home / Self Care | Payer: Medicare HMO | Source: Ambulatory Visit | Attending: Physician Assistant | Admitting: Physician Assistant

## 2023-01-04 DIAGNOSIS — K7689 Other specified diseases of liver: Secondary | ICD-10-CM

## 2023-01-04 DIAGNOSIS — N281 Cyst of kidney, acquired: Secondary | ICD-10-CM | POA: Diagnosis not present

## 2023-01-06 DIAGNOSIS — H5203 Hypermetropia, bilateral: Secondary | ICD-10-CM | POA: Diagnosis not present

## 2023-01-07 ENCOUNTER — Encounter: Payer: Self-pay | Admitting: Acute Care

## 2023-01-07 ENCOUNTER — Ambulatory Visit (INDEPENDENT_AMBULATORY_CARE_PROVIDER_SITE_OTHER): Payer: Medicare HMO | Admitting: Acute Care

## 2023-01-07 VITALS — BP 130/78 | HR 99 | Temp 97.9°F | Ht 66.0 in | Wt 183.4 lb

## 2023-01-07 DIAGNOSIS — J302 Other seasonal allergic rhinitis: Secondary | ICD-10-CM | POA: Diagnosis not present

## 2023-01-07 DIAGNOSIS — R0982 Postnasal drip: Secondary | ICD-10-CM | POA: Diagnosis not present

## 2023-01-07 DIAGNOSIS — R053 Chronic cough: Secondary | ICD-10-CM | POA: Diagnosis not present

## 2023-01-07 MED ORDER — GABAPENTIN 100 MG PO CAPS
100.0000 mg | ORAL_CAPSULE | Freq: Every day | ORAL | 0 refills | Status: DC
Start: 2023-01-07 — End: 2023-01-21

## 2023-01-07 MED ORDER — AZELASTINE HCL 0.1 % NA SOLN
1.0000 | Freq: Two times a day (BID) | NASAL | 5 refills | Status: DC | PRN
Start: 2023-01-07 — End: 2024-04-11

## 2023-01-07 NOTE — Patient Instructions (Addendum)
It is good to see you today.  We will do a FENO today to assess your airway inflammation  Please resume your Astelin spray , one spray , twice daily twice daily. Increase Claritin to twice daily. Start Gabapentin 100 mg at bedtime . Do not drive if sleepy. Continue GERD medications as you have been doing.  Sips of water instead of throat clearing Sugar Free Coca-Cola or Werther's originals for throat soothing. Delsym Cough syrup 5 cc's every 12 hours Non-sedating antihistamine of your choice daily Claritin ( Generic ok)  Follow up  video visit in 2 weeks with Maralyn Sago NP to ensure you are getting better.  Call if you need [place] sooner.  Good luck with your surgery Please contact office for sooner follow up if symptoms do not improve or worsen or seek emergency care

## 2023-01-07 NOTE — Progress Notes (Signed)
History of Present Illness Tracy Mckenzie is a 67 y.o. female with history of hypertension, GERD,   (EGD 2018 showed hiatal hernia and nonbleeding gastric ulcers secondary to NSAID use. She is on PPI ), mild left hemidiaphragm elevation, osteoporosis, arthritis and  chronic cough for close to a year, associated with allergic rhinitis, postnasal drip. She is followed by Dr. Isaiah Mckenzie. Maintenance is Astelin, Singulair, albuterol and Xyzal . Chest imaging is normal. Last checked 04/2022   01/07/2023 Pt. Presents for follow up for chronic cough. She was last seen in this office 05/23/2020 by Dr. Isaiah Mckenzie. At that time she had a chronic cough for > than 1 year. This is associated with allergic rhinitis, postnasal drip . She had been treating her self with cough drops. Given Astelin, Singulair, albuterol and Xyzal at primary care with some improvement .  She has a PMH of GERD, EGD in 2018 showed hiatal hernia and nonbleeding gastric ulcers secondary to NSAID use. She is on PPI . Pt returns today. She had been doing better on Benzonatate as needed for her cough.  She had an allergy attack in March 2024 and she has not been able to shake the cough . She is not on an ACE, but is on Losartan. She was seen by her PCP. She switched her from xyzol to claritin.  Her PCP tried antibiotic, and prednisone taper, neither have helped so she has sent the patient to see [place]. Patient has a significant leg surgery with knee replacement and leg realignment scheduled, so she would like the cough to be resolved before surgery. Surgery is scheduled 02/14/2023.If they can get this done sooner. She denies any fever, no purulent secretions, just a dry non-productive cough. She is not using her Astelin and her albuterol as needed. She has tried gabapentin for back pain. We discussed using this short term to see if she sees any improvement in her cough.FENO in the office today is 14 ppb.    Test Results: FENO is 14 PPB Imaging: CXR  04/19/2022 Chest x-ray 01/31/2020-mild left hemidiaphragm elevation.  No active cardiopulmonary disease.  I reviewed the images personally.   PFTs: 05/23/2020 FVC 2.85 [82%], FEV1 2.20 [82%], F/F 77, TLC 5.47 [100%], DLCO 18.44 [85%]  Current Medications 12/2022      Latest Ref Rng & Units 01/03/2023    9:27 AM 08/12/2022    8:54 AM 04/19/2022    4:54 PM  CBC  WBC 4.0 - 10.5 K/uL 12.5  11.6  23.6   Hemoglobin 12.0 - 15.0 g/dL 16.1  09.6  04.5   Hematocrit 36.0 - 46.0 % 40.5  40.9  38.9   Platelets 150.0 - 400.0 K/uL 389.0  379.0  373        Latest Ref Rng & Units 01/03/2023    9:27 AM 08/12/2022    8:54 AM 04/19/2022    4:54 PM  BMP  Glucose 70 - 99 mg/dL 99  409  811   BUN 6 - 23 mg/dL 19  18  16    Creatinine 0.40 - 1.20 mg/dL 9.14  7.82  9.56   Sodium 135 - 145 mEq/L 141  147  140   Potassium 3.5 - 5.1 mEq/L 4.2  5.3  4.0   Chloride 96 - 112 mEq/L 102  106  105   CO2 19 - 32 mEq/L 30  32  23   Calcium 8.4 - 10.5 mg/dL 9.6  21.3  08.6     BNP No results found  for: "BNP"  ProBNP No results found for: "PROBNP"  PFT    Component Value Date/Time   FEV1PRE 2.19 05/23/2020 0858   FEV1POST 2.20 05/23/2020 0858   FVCPRE 2.90 05/23/2020 0858   FVCPOST 2.85 05/23/2020 0858   TLC 5.47 05/23/2020 0858   DLCOUNC 18.44 05/23/2020 0858   PREFEV1FVCRT 76 05/23/2020 0858   PSTFEV1FVCRT 77 05/23/2020 0858    US Abdomen Limited RUQ (LIVER/GB)  Result Date: 01/04/2023 CLINICAL DATA:  Follow-up renal cyst. EXAM: ULTRASOUND ABDOMEN LIMITED RIGHT UPPER QUADRANT COMPARISON:  CT of the abdomen and pelvis April 18, 2022 FINDINGS: Gallbladder: No gallstones or wall thickening visualized. No sonographic Murphy sign noted by sonographer. Common bile duct: Diameter: 3.3 mm Liver: A 6.5 cm simple cyst is identified in the left hepatic lobe, not significantly changed to slightly larger. No follow-up recommended for this cyst. Portal vein is patent on color Doppler imaging with normal direction of  blood flow towards the liver. Other: None. IMPRESSION: 1. A 6.5 cm simple cyst is identified in the left hepatic lobe, not significantly changed to slightly larger. No follow-up recommended for this cyst. 2. No other abnormalities. Electronically Signed   By: Gerome Sam III M.D.   On: 01/04/2023 17:26   ECHOCARDIOGRAM COMPLETE  Result Date: 12/24/2022    ECHOCARDIOGRAM REPORT   Patient Name:   Tracy Mckenzie Date of Exam: 12/24/2022 Medical Rec #:  [id number]      Height:       66.0 in Accession #:    [id number]     Weight:       182.4 lb Date of Birth:  [birth year]       BSA:          1.923 m Patient Age:    66 years       BP:           124/70 mmHg Patient Gender: F              HR:           96 bpm. Exam Location:  [address] Procedure: 2D Echo, 3D Echo, Cardiac Doppler and Color Doppler Indications:    R06.02 Shortness of Breath  History:        Patient has no prior history of Echocardiogram examinations.                 Arrythmias:RBBB, Signs/Symptoms:Shortness of Breath and Chest                 Pain; Risk Factors:Family History of Coronary Artery Disease,                 Hypertension and Dyslipidemia. Pre-Operative Eval for Knee                 Replacement.  Sonographer:    Farrel Conners RDCS Referring Phys: Bernadette Hoit CONTE IMPRESSIONS  1. Probable liver cyst; suggest dedicated ultrasound to better assess.  2. Left ventricular ejection fraction, by estimation, is 60 to 65%. The left ventricle has normal function. The left ventricle has no regional wall motion abnormalities. Left ventricular diastolic parameters are consistent with Grade I diastolic dysfunction (impaired relaxation).  3. Right ventricular systolic function is normal. The right ventricular size is normal.  4. The mitral valve is normal in structure. Trivial mitral valve regurgitation. No evidence of mitral stenosis.  5. The aortic valve is tricuspid. Aortic valve regurgitation is not visualized. No aortic stenosis is present.  6. The  inferior vena  cava is normal in size with greater than 50% respiratory variability, suggesting right atrial pressure of 3 mmHg. Comparison(s): No prior Echocardiogram. FINDINGS  Left Ventricle: Left ventricular ejection fraction, by estimation, is 60 to 65%. The left ventricle has normal function. The left ventricle has no regional wall motion abnormalities. The left ventricular internal cavity size was normal in size. There is  no left ventricular hypertrophy. Left ventricular diastolic parameters are consistent with Grade I diastolic dysfunction (impaired relaxation). Right Ventricle: The right ventricular size is normal. Right ventricular systolic function is normal. Left Atrium: Left atrial size was normal in size. Right Atrium: Right atrial size was normal in size. Pericardium: There is no evidence of pericardial effusion. Mitral Valve: The mitral valve is normal in structure. Mild mitral annular calcification. Trivial mitral valve regurgitation. No evidence of mitral valve stenosis. Tricuspid Valve: The tricuspid valve is normal in structure. Tricuspid valve regurgitation is trivial. No evidence of tricuspid stenosis. Aortic Valve: The aortic valve is tricuspid. Aortic valve regurgitation is not visualized. No aortic stenosis is present. Pulmonic Valve: The pulmonic valve was normal in structure. Pulmonic valve regurgitation is trivial. No evidence of pulmonic stenosis. Aorta: The aortic root is normal in size and structure. Venous: The inferior vena cava is normal in size with greater than 50% respiratory variability, suggesting right atrial pressure of 3 mmHg. IAS/Shunts: No atrial level shunt detected by color flow Doppler. Additional Comments: Probable liver cyst; suggest dedicated ultrasound to better assess.  LEFT VENTRICLE PLAX 2D LVIDd:         4.15 cm   Diastology LVIDs:         2.50 cm   LV e' medial:    6.20 cm/s LV PW:         1.00 cm   LV E/e' medial:  11.8 LV IVS:        1.00 cm   LV e' lateral:    9.36 cm/s LVOT diam:     2.10 cm   LV E/e' lateral: 7.8 LV SV:         74 LV SV Index:   38 LVOT Area:     3.46 cm                           3D Volume EF:                          3D EF:        67 %                          LV EDV:       138 ml                          LV ESV:       45 ml                          LV SV:        92 ml RIGHT VENTRICLE RV Basal diam:  3.20 cm RV S prime:     9.64 cm/s TAPSE (M-mode): 2.2 cm RVSP:           24.3 mmHg LEFT ATRIUM             Index        RIGHT  ATRIUM           Index LA diam:        3.45 cm 1.79 cm/m   RA Pressure: 3.00 mmHg LA Vol (A2C):   47.5 ml 24.70 ml/m  RA Area:     9.53 cm LA Vol (A4C):   31.2 ml 16.22 ml/m  RA Volume:   19.60 ml  10.19 ml/m LA Biplane Vol: 42.9 ml 22.31 ml/m  AORTIC VALVE LVOT Vmax:   104.00 cm/s LVOT Vmean:  69.050 cm/s LVOT VTI:    0.214 m  AORTA Ao Root diam: 3.00 cm Ao Asc diam:  3.20 cm MITRAL VALVE                TRICUSPID VALVE MV Area (PHT): cm          TR Peak grad:   21.3 mmHg MV Decel Time: 229 msec     TR Vmax:        231.00 cm/s MV E velocity: 73.10 cm/s   Estimated RAP:  3.00 mmHg MV A velocity: 105.50 cm/s  RVSP:           24.3 mmHg MV E/A ratio:  0.69                             SHUNTS                             Systemic VTI:  0.21 m                             Systemic Diam: 2.10 cm Olga Millers MD Electronically signed by Olga Millers MD Signature Date/Time: 12/24/2022/1:51:21 PM    Final      Past medical hx Past Medical History:  Diagnosis Date   Arthritis    on meds   Bleeding ulcer    Cataract    recently dx-bilaterally   Elevated cholesterol    on meds   GERD (gastroesophageal reflux disease)    on meds   Hiatal hernia    High blood pressure    on meds   Kidney cysts    Liver cyst    Nerve pain    L sciatic pain radiating to R   Osteoporosis    reaction to Prolia=   Peptic ulcer disease    Seasonal allergies      Social History   Tobacco Use   Smoking status: Never   Smokeless  tobacco: Never  Vaping Use   Vaping Use: Never used  Substance Use Topics   Alcohol use: No   Drug use: No    Tracy Mckenzie reports that she has never smoked. She has never used smokeless tobacco. She reports that she does not drink alcohol and does not use drugs.  Tobacco Cessation: Never smoker    Past surgical hx, Family hx, Social hx all reviewed.  Current Outpatient Medications on File Prior to Visit  Medication Sig   albuterol (VENTOLIN HFA) 108 (90 Base) MCG/ACT inhaler Inhale 2 puffs into the lungs every 6 (six) hours as needed for wheezing or shortness of breath.   AREXVY 120 MCG/0.5ML injection    azelastine (ASTELIN) 0.1 % nasal spray Place 1 spray into both nostrils 2 (two) times daily as needed for rhinitis or allergies.   baclofen (LIORESAL) 10 MG tablet TAKE ONE TABLET BY  MOUTH THREE TIMES A DAY (Patient taking differently: Take 10 mg by mouth as needed for muscle spasms.)   benzonatate (TESSALON) 100 MG capsule TAKE 1 TO 2 CAPSULES BY MOUTH TWICE A DAY AS NEEDED FOR COUGH   BORON PO Take 10 mg by mouth daily at 6 (six) AM. BIO BORON   Cholecalciferol (VITAMIN D3 PO) Take 10,000 Int'l Units/day by mouth daily at 6 (six) AM.   Coenzyme Q10-Fish Oil-Vit E (CO-Q 10 OMEGA-3 FISH OIL PO) Take 1 capsule by mouth daily at 6 (six) AM.   FLUAD QUADRIVALENT 0.5 ML injection Inject 0.5 mLs into the muscle once.   fluconazole (DIFLUCAN) 150 MG tablet Take 1 tablet (150 mg total) by mouth daily.   gabapentin (NEURONTIN) 300 MG capsule TAKE ONE CAPSULE BY MOUTH TWICE A DAY (Patient taking differently: Take 300 mg by mouth as needed.)   hydrochlorothiazide (MICROZIDE) 12.5 MG capsule TAKE ONE CAPSULE BY MOUTH DAILY   hydrOXYzine (ATARAX) 10 MG tablet Take 1 tablet (10 mg total) by mouth every 6 (six) hours.   Loratadine (CLARITIN PO) Take 10 mg by mouth daily at 6 (six) AM.   losartan (COZAAR) 50 MG tablet TAKE ONE TABLET BY MOUTH TWICE A DAY FOR BLOOD PRESSURE   magnesium 30 MG tablet  Take 200 mg by mouth daily.   methocarbamol (ROBAXIN) 500 MG tablet Take 1 tablet (500 mg total) by mouth 3 (three) times daily as needed for muscle spasms.   metoprolol tartrate (LOPRESSOR) 25 MG tablet Take 1 tablet (25 mg total) by mouth 2 (two) times daily.   montelukast (SINGULAIR) 10 MG tablet TAKE ONE TABLET BY MOUTH AT BEDTIME   nitroGLYCERIN (NITROSTAT) 0.4 MG SL tablet Place 1 tablet (0.4 mg total) under the tongue every 5 (five) minutes as needed for chest pain.   omeprazole (PRILOSEC) 40 MG capsule TAKE 1 CAPSULE BY MOUTH DAILY 30 MINUTES BEFORE BREAKFAST   Spacer/Aero-Holding Chambers (PRO COMFORT SPACER ADULT) MISC 1 Device by Does not apply route as needed.   traMADol-acetaminophen (ULTRACET) 37.5-325 MG tablet Take 1 tablet by mouth every 8 (eight) hours as needed.   TURMERIC PO Take 1 capsule by mouth daily at 6 (six) AM.   zinc gluconate 50 MG tablet Take 50 mg by mouth daily.   No current facility-administered medications on file prior to visit.     Allergies  Allergen Reactions   Meperidine Palpitations and Anaphylaxis    flushed   Ibuprofen Other (See Comments)    Stomach ulcers.   Nsaids Other (See Comments)    Peptic ulcers   Simvastatin     Other reaction(s): Muscle Pain   Tape Other (See Comments)    "Sometimes removes skin"   Oxycodone-Acetaminophen Itching and Rash   Prolia [Denosumab] Rash   Vicodin [Hydrocodone-Acetaminophen] Itching    Review Of Systems:  Constitutional:   No  weight loss, night sweats,  Fevers, chills, fatigue, or  lassitude.  HEENT:   No headaches,  Difficulty swallowing,  Tooth/dental problems, or  Sore throat,                No sneezing, itching, ear ache, +nasal congestion,+ post nasal drip,   CV:  No chest pain,  Orthopnea, PND, swelling in lower extremities, anasarca, dizziness, palpitations, syncope.   GI  No heartburn, indigestion, abdominal pain, nausea, vomiting, diarrhea, change in bowel habits, loss of appetite,  bloody stools.   Resp: No shortness of breath with exertion or at rest.  No excess  mucus, No productive cough,  + non-productive cough,  No coughing up of blood.  No change in color of mucus.  No wheezing.  No chest wall deformity  Skin: no rash or lesions.  GU: no dysuria, change in color of urine, no urgency or frequency.  No flank pain, no hematuria   MS:  + joint pain or swelling.  + decreased range of motion>> Knee,  + back pain.  Psych:  No change in mood or affect. No depression or anxiety.  No memory loss.   Vital Signs BP 130/78   Pulse 99   Temp 97.9 F (36.6 C) (Oral)   Ht 5\' 6"  (1.676 m)   Wt 183 lb 6.4 oz (83.2 kg)   SpO2 98%   BMI 29.60 kg/m    Physical Exam:  General- No distress,  A&Ox3, pleasant ENT: No sinus tenderness, TM clear, pink edematous nasal mucosa, no oral exudate,+ post nasal drip, no LAN Cardiac: S1, S2, regular rate and rhythm, no murmur Chest: No wheeze/ rales/ dullness; no accessory muscle use, no nasal flaring, no sternal retractions Abd.: Soft Non-tender, ND, BS +, Body mass index is 29.6 kg/m.  Ext: No clubbing cyanosis, edema Neuro:  normal strength, MAE x 4, A&O x 3, appropriate Skin: No rashes, warm and dry, no lesions  Psych: normal mood and behavior   Assessment/Plan Chronic cough associated with allergic rhinitis, postnasal drip Flared after exposure to dust in March 2024.  Right Knee surgery 02/14/2023 Plan We will do a FENO today to assess your airway inflammation  Please resume your Astelin spray , one spray , twice daily. Increase Claritin to twice daily. Start Gabapentin 100 mg at bedtime . Do not drive if sleepy. Continue GERD medications as you have been doing.  Sips of water instead of throat clearing Sugar Free Coca-Cola or Wer[employer]'s originals for throat soothing. Delsym Cough syrup 5 cc's every 12 hours Non-sedating antihistamine of your choice daily Claritin ( Generic ok)  Follow up  video visit in 2 weeks  with Maralyn Sago NP to ensure you are getting better.  Call if you need [place] sooner.  Good luck with your surgery Please contact office for sooner follow up if symptoms do not improve or worsen or seek emergency care     I spent 33 minutes dedicated to the care of this patient on the date of this encounter to include pre-visit review of records, face-to-face time with the patient discussing conditions above, post visit ordering of testing, clinical documentation with the electronic health record, making appropriate referrals as documented, and communicating necessary information to the patient's healthcare team.    Bevelyn Ngo, NP 01/07/2023  10:53 AM

## 2023-01-11 ENCOUNTER — Encounter: Payer: Self-pay | Admitting: Physician Assistant

## 2023-01-18 DIAGNOSIS — M25561 Pain in right knee: Secondary | ICD-10-CM | POA: Diagnosis not present

## 2023-01-18 DIAGNOSIS — M25661 Stiffness of right knee, not elsewhere classified: Secondary | ICD-10-CM | POA: Insufficient documentation

## 2023-01-18 DIAGNOSIS — M1711 Unilateral primary osteoarthritis, right knee: Secondary | ICD-10-CM | POA: Diagnosis not present

## 2023-01-19 ENCOUNTER — Other Ambulatory Visit: Payer: Self-pay

## 2023-01-19 ENCOUNTER — Other Ambulatory Visit (HOSPITAL_COMMUNITY): Payer: Medicare HMO

## 2023-01-19 MED ORDER — HYDROCHLOROTHIAZIDE 12.5 MG PO CAPS: 12.5000 mg | ORAL_CAPSULE | Freq: Every day | ORAL | 3 refills | Status: DC

## 2023-01-19 NOTE — H&P (Signed)
TOTAL KNEE ADMISSION H&P  Patient is being admitted for right total knee arthroplasty.  Subjective:  Chief Complaint: Right knee pain.  HPI: Tracy Mckenzie, 67 y.o. female has a history of pain and functional disability in the right knee due to arthritis and has failed non-surgical conservative treatments for greater than 12 weeks to include corticosteriod injections and activity modification. Onset of symptoms was gradual, starting  several  years ago with gradually worsening course since that time. The patient noted no past surgery on the right knee.  Patient currently rates pain in the right knee at 8 out of 10 with activity. Patient has night pain, worsening of pain with activity and weight bearing, pain with passive range of motion, and crepitus. Patient has evidence of  bone-on-bone arthritis in the lateral compartment of the right knee. She is near bone-on-bone patellofemoral with large osteophytes  by imaging studies. There is no active infection.  Patient Active Problem List   Diagnosis Date Noted   Right bundle branch block (RBBB) 12/01/2022   Mild CAD 12/01/2022   Leukocytosis 08/12/2022   Chronic cough 08/12/2022   Back pain 11/27/2020   Osteopenia 11/27/2020   Sciatica 11/27/2020   Surgical menopause 11/27/2020   Tibia/fibula fracture 11/27/2020   Unilateral primary osteoarthritis, right knee 09/05/2019   Hyperlipidemia 08/15/2019   Essential hypertension 08/13/2019   GERD (gastroesophageal reflux disease) 08/13/2019   Osteoporosis 08/13/2019   Post-traumatic osteoarthritis of right knee 08/13/2019   Hiatal hernia 08/13/2019   Chronic left-sided low back pain without sciatica 08/13/2019    Past Medical History:  Diagnosis Date   Arthritis    on meds   Bleeding ulcer    Cataract    recently dx-bilaterally   Elevated cholesterol    on meds   GERD (gastroesophageal reflux disease)    on meds   Hiatal hernia    High blood pressure    on meds   Kidney cysts     Liver cyst    Nerve pain    L sciatic pain radiating to R   Osteoporosis    reaction to Prolia=   Peptic ulcer disease    Seasonal allergies     Past Surgical History:  Procedure Laterality Date   APPENDECTOMY  1992   BREAST CYST ASPIRATION Left 2003   COLONOSCOPY  2014   [facility]   KNEE ARTHROSCOPY Right 04/12/2001   torn cartilage   LAPAROSCOPY  1981   for endometriosis   LEG SURGERY Right 11/23/2000   to remove screws   LEG SURGERY Right 02/08/2002   to remove rod   MENISCUS REPAIR  01/29/2003   TIBIA FRACTURE SURGERY Right 06/12/2000   Multiple surgeries   TONSILLECTOMY  1967   TOTAL ABDOMINAL HYSTERECTOMY  1992   WISDOM TOOTH EXTRACTION      Prior to Admission medications   Medication Sig Start Date End Date Taking? Authorizing Provider  albuterol (VENTOLIN HFA) 108 (90 Base) MCG/ACT inhaler Inhale 2 puffs into the lungs every 6 (six) hours as needed for wheezing or shortness of breath. 11/23/22   Allwardt, Crist Infante, PA-C  AREXVY 120 MCG/0.5ML injection  06/28/22   [provider]  azelastine (ASTELIN) 0.1 % nasal spray Place 1 spray into both nostrils 2 (two) times daily as needed for rhinitis or allergies. 01/07/23   Bevelyn Ngo, NP  baclofen (LIORESAL) 10 MG tablet TAKE ONE TABLET BY MOUTH THREE TIMES A DAY Patient taking differently: Take 10 mg by mouth as needed for  muscle spasms. 08/07/21   Allwardt, Alyssa M, PA-C  benzonatate (TESSALON) 100 MG capsule TAKE 1 TO 2 CAPSULES BY MOUTH TWICE A DAY AS NEEDED FOR COUGH 12/01/22   Allwardt, Alyssa M, PA-C  BORON PO Take 10 mg by mouth daily at 6 (six) AM. BIO BORON    [provider]  Cholecalciferol (VITAMIN D3 PO) Take 10,000 Int'l Units/day by mouth daily at 6 (six) AM.    [provider]  Coenzyme Q10-Fish Oil-Vit E (CO-Q 10 OMEGA-3 FISH OIL PO) Take 1 capsule by mouth daily at 6 (six) AM.    [provider]  FLUAD QUADRIVALENT 0.5 ML injection Inject 0.5 mLs into the  muscle once. 06/28/22   [provider]  fluconazole (DIFLUCAN) 150 MG tablet Take 1 tablet (150 mg total) by mouth daily. 09/01/22   Allwardt, Crist Infante, PA-C  gabapentin (NEURONTIN) 100 MG capsule Take 1 capsule (100 mg total) by mouth at bedtime. 01/07/23   Bevelyn Ngo, NP  gabapentin (NEURONTIN) 300 MG capsule TAKE ONE CAPSULE BY MOUTH TWICE A DAY Patient taking differently: Take 300 mg by mouth as needed. 09/21/21   Allwardt, Crist Infante, PA-C  hydrochlorothiazide (MICROZIDE) 12.5 MG capsule Take 1 capsule (12.5 mg total) by mouth daily. 01/19/23   Allwardt, Crist Infante, PA-C  hydrOXYzine (ATARAX) 10 MG tablet Take 1 tablet (10 mg total) by mouth every 6 (six) hours. 08/12/22   Allwardt, Crist Infante, PA-C  Loratadine (CLARITIN PO) Take 10 mg by mouth daily at 6 (six) AM.    [provider]  losartan (COZAAR) 50 MG tablet TAKE ONE TABLET BY MOUTH TWICE A DAY FOR BLOOD PRESSURE 08/12/22   Allwardt, Alyssa M, PA-C  magnesium 30 MG tablet Take 200 mg by mouth daily.    [provider]  methocarbamol (ROBAXIN) 500 MG tablet Take 1 tablet (500 mg total) by mouth 3 (three) times daily as needed for muscle spasms. 04/30/22   Allwardt, Crist Infante, PA-C  metoprolol tartrate (LOPRESSOR) 25 MG tablet Take 1 tablet (25 mg total) by mouth 2 (two) times daily. 08/12/22   Allwardt, Alyssa M, PA-C  montelukast (SINGULAIR) 10 MG tablet TAKE ONE TABLET BY MOUTH AT BEDTIME 10/21/22   Allwardt, Alyssa M, PA-C  nitroGLYCERIN (NITROSTAT) 0.4 MG SL tablet Place 1 tablet (0.4 mg total) under the tongue every 5 (five) minutes as needed for chest pain. 11/27/20   Orland Mustard, MD  omeprazole (PRILOSEC) 40 MG capsule TAKE 1 CAPSULE BY MOUTH DAILY 30 MINUTES BEFORE BREAKFAST 06/28/22   Allwardt, Crist Infante, PA-C  Spacer/Aero-Holding Chambers (PRO COMFORT SPACER ADULT) MISC 1 Device by Does not apply route as needed. 03/07/20   Orland Mustard, MD  traMADol-acetaminophen (ULTRACET) 37.5-325 MG tablet Take 1 tablet by  mouth every 8 (eight) hours as needed. 11/26/21   Dulce Sellar, NP  TURMERIC PO Take 1 capsule by mouth daily at 6 (six) AM.    [provider]  zinc gluconate 50 MG tablet Take 50 mg by mouth daily.    [provider]    Allergies  Allergen Reactions   Meperidine Palpitations and Anaphylaxis    flushed   Ibuprofen Other (See Comments)    Stomach ulcers.   Nsaids Other (See Comments)    Peptic ulcers   Simvastatin     Other reaction(s): Muscle Pain   Tape Other (See Comments)    "Sometimes removes skin"   Oxycodone-Acetaminophen Itching and Rash   Prolia [Denosumab] Rash   Vicodin [  Hydrocodone-Acetaminophen] Itching    Social History   Socioeconomic History   Marital status: Married    Spouse name: Not on file   Number of children: Not on file   Years of education: Not on file   Highest education level: Not on file  Occupational History   Not on file  Tobacco Use   Smoking status: Never   Smokeless tobacco: Never  Vaping Use   Vaping Use: Never used  Substance and Sexual Activity   Alcohol use: No   Drug use: No   Sexual activity: Not on file  Other Topics Concern   Not on file  Social History Narrative   Not on file   Social Determinants of Health   Financial Resource Strain: Low Risk  (11/02/2022)   Overall Financial Resource Strain (CARDIA)    Difficulty of Paying Living Expenses: Not hard at all  Food Insecurity: No Food Insecurity (11/02/2022)   Hunger Vital Sign    Worried About Running Out of Food in the Last Year: Never true    Ran Out of Food in the Last Year: Never true  Transportation Needs: No Transportation Needs (11/02/2022)   PRAPARE - Administrator, Civil Service (Medical): No    Lack of Transportation (Non-Medical): No  Physical Activity: Inactive (11/02/2022)   Exercise Vital Sign    Days of Exercise per Week: 0 days    Minutes of Exercise per Session: 0 min  Stress: Stress Concern Present (11/02/2022)    [employer] of Occupational Health - Occupational Stress Questionnaire    Feeling of Stress : To some extent  Social Connections: Moderately Integrated (11/02/2022)   Social Connection and Isolation Panel [NHANES]    Frequency of Communication with Friends and Family: More than three times a week    Frequency of Social Gatherings with Friends and Family: More than three times a week    Attends Religious Services: 1 to 4 times per year    Active Member of [employer] or Organizations: No    Attends Banker Meetings: Never    Marital Status: Married  Catering manager Violence: Not At Risk (11/02/2022)   Humiliation, Afraid, Rape, and Kick questionnaire    Fear of Current or Ex-Partner: No    Emotionally Abused: No    Physically Abused: No    Sexually Abused: No    Tobacco Use: Low Risk  (01/07/2023)   Patient History    Smoking Tobacco Use: Never    Smokeless Tobacco Use: Never    Passive Exposure: Not on file   Social History   Substance and Sexual Activity  Alcohol Use No    Family History  Problem Relation Age of Onset   Kidney disease Mother    COPD Mother    COPD Father    Breast cancer Sister 64   Breast cancer Maternal Aunt    Breast cancer Paternal Aunt    Diabetes Maternal Grandfather    Stroke Paternal Grandfather    Colon cancer Neg Hx    Colon polyps Neg Hx    Esophageal cancer Neg Hx    Rectal cancer Neg Hx    Stomach cancer Neg Hx     Review of Systems  Constitutional:  Negative for chills and fever.  HENT:  Negative for congestion, sore throat and tinnitus.   Eyes:  Negative for double vision, photophobia and pain.  Respiratory:  Negative for cough, shortness of breath and wheezing.   Cardiovascular:  Negative for  chest pain, palpitations and orthopnea.  Gastrointestinal:  Negative for heartburn, nausea and vomiting.  Genitourinary:  Negative for dysuria, frequency and urgency.  Musculoskeletal:  Positive for joint pain.  Neurological:   Negative for dizziness, weakness and headaches.    Objective:  Physical Exam: Well nourished and well developed.  General: Alert and oriented x3, cooperative and pleasant, no acute distress.  Head: normocephalic, atraumatic, neck supple.  Eyes: EOMI.  Musculoskeletal:  Right knee shows no effusion. She has a scar from previous tibial nail insertion and removal. Her range of motion is 10-120. There is crepitus on range of motion. She is tender lateral greater than medial. There is no instability about the knee.  Calves soft and nontender. Motor function intact in LE. Strength 5/5 LE bilaterally. Neuro: Distal pulses 2+. Sensation to light touch intact in LE.  Imaging Review Plain radiographs demonstrate severe degenerative joint disease of the right knee. The overall alignment is neutral. The bone quality appears to be adequate for age and reported activity level.  Assessment/Plan:  End stage arthritis, right knee   The patient history, physical examination, clinical judgment of the provider and imaging studies are consistent with end stage degenerative joint disease of the right knee and total knee arthroplasty is deemed medically necessary. The treatment options including medical management, injection therapy arthroscopy and arthroplasty were discussed at length. The risks and benefits of total knee arthroplasty were presented and reviewed. The risks due to aseptic loosening, infection, stiffness, patella tracking problems, thromboembolic complications and other imponderables were discussed. The patient acknowledged the explanation, agreed to proceed with the plan and consent was signed. Patient is being admitted for inpatient treatment for surgery, pain control, PT, OT, prophylactic antibiotics, VTE prophylaxis, progressive ambulation and ADLs and discharge planning. The patient is planning to be discharged  home .   Patient's anticipated LOS is less than 2 midnights, meeting these  requirements: - Lives within 1 hour of care - Has a competent adult at home to recover with post-op recover - NO history of  - Chronic pain requiring opioids  - Diabetes  - Coronary Artery Disease  - Heart failure  - Heart attack  - Stroke  - DVT/VTE  - Cardiac arrhythmia  - Respiratory Failure/COPD  - Renal failure  - Anemia  - Advanced Liver disease  Therapy Plans: Outpatient therapy at [facility] Disposition: Home with husband Planned DVT Prophylaxis: Xarelto 10 mg QD DME Needed: Dan Humphreys PCP: Alyssa Allwardt, PA-C (clearance received) TXA: IV Allergies: NSAIDs (hx GI bleed), percocet, vicodin Anesthesia Concerns: Difficulty awakening with general BMI: 28.6 Last HgbA1c: 5.8% (01/03/2023) [facility]: [facility]  Other: - Needs benzonatate & azalastine nasal spray (BID) available - NO AQUACEL   - Patient was instructed on what medications to stop prior to surgery. - Follow-up visit in 2 weeks with Dr. Lequita Halt - Begin physical therapy following surgery - Pre-operative lab work as pre-surgical testing - Prescriptions will be provided in hospital at time of discharge  Arther Abbott, PA-C Orthopedic Surgery [facility] [place]

## 2023-01-21 ENCOUNTER — Encounter: Payer: Self-pay | Admitting: Acute Care

## 2023-01-21 ENCOUNTER — Telehealth (INDEPENDENT_AMBULATORY_CARE_PROVIDER_SITE_OTHER): Payer: Medicare HMO | Admitting: Acute Care

## 2023-01-21 DIAGNOSIS — R053 Chronic cough: Secondary | ICD-10-CM | POA: Diagnosis not present

## 2023-01-21 DIAGNOSIS — J302 Other seasonal allergic rhinitis: Secondary | ICD-10-CM | POA: Diagnosis not present

## 2023-01-21 MED ORDER — GABAPENTIN 100 MG PO CAPS
100.0000 mg | ORAL_CAPSULE | Freq: Every day | ORAL | 3 refills | Status: DC
Start: 2023-01-21 — End: 2023-06-15

## 2023-01-21 NOTE — Patient Instructions (Addendum)
It was good to see you today Continue  your Astelin spray , one spray , twice daily twice daily. Continue  Claritin to twice daily.>> Can try dropping back to once daily now that cough is better Start Gabapentin 100 mg at bedtime for cough . Do not drive if sleepy. I have sent in a refill. Please try weaning off this after surgery to see if you still need it for cough Continue GERD medications as you have been doing.  Sips of water instead of throat clearing Sugar Free Coca-Cola or Werther's originals for throat soothing. Delsym Cough syrup 5 cc's every 12 hours Follow up as needed.  Daisey Must with surgery. Please contact office for sooner follow up if symptoms do not improve or worsen or seek emergency care

## 2023-01-21 NOTE — Progress Notes (Signed)
Virtual Visit via Video Note  I connected with Tracy Mckenzie on 01/21/23 at 10:30 AM EDT by a video enabled telemedicine application and verified that I am speaking with the correct person using two identifiers.  Location: Patient:  At home Provider: [address], [place], [place], [address]    I discussed the limitations of evaluation and management by telemedicine and the availability of in person appointments. The patient expressed understanding and agreed to proceed.  History of Present Illness: Pt. Presents for follow up after OV  treatment for cough 01/07/2023 for chronic cough that she had for > 1 year. She had not been seen at the practice since 05/2020. She was seen 01/07/2023 and had been doing better on Benzonatate as needed for her cough.  She had an allergy attack in March and she has not been able to shake the cough . She was seen by her PCP. She switched her from xyzol to claritin. She was not using her Astelin and her albuterol as needed. Her PCP tried antibiotic, and prednisone taper, neither have helped so she has sent the patient to see [place]. Patient has a significant leg surgery with knee replacement and leg realignment scheduled, so she would like the cough to be resolved before surgery. Surgery is scheduled 02/14/2023. We did FENO which was 14 PPB. Plan after that visit was as follows: Resume your Astelin spray , one spray , twice daily twice daily. Increase Claritin to twice daily. Start Gabapentin 100 mg at bedtime for cough . Do not drive if sleepy. Continue GERD medications as you have been doing.  Sips of water instead of throat clearing Sugar Free Coca-Cola or Werther's originals for throat soothing. Delsym Cough syrup 5 cc's every 12 hours Non-sedating antihistamine of your choice daily Claritin ( Generic ok)   She presents today stating her cough is much better. She has started using her Astelin twice daily, she has resumed her claritin twice daily. She  has also started using her gabapentin 100 mg at bedtime with hydroxyzine. She uses her benzonatate as needed for break through cough. She has used this 3-4 times in 2 weeks.  She has surgery is scheduled 7/15 for a right knee replacement . She has communicated her medications to her surgeon. We discussed weaning off the gabapentin and decreasing Claritin to daily after her surgery. Goal is for least amount of medication with no cough. She is in agreement to trying to wean of gabapentin after surgery, and decreasing her Claritin to daily after surgery.   Observations/Objective: 01/07/2023 FENO >> 14 PPB Chest x-ray 01/31/2020-mild left hemidiaphragm elevation. No active cardiopulmonary disease. I reviewed the images personally.  05/23/2020 FVC 2.85 [82%], FEV1 2.20 [82%], F/F 77, TLC 5.47 [100%], DLCO 18.44 [85%] Assessment and Plan: Chronic Cough, ? Allergy flare that is slow to resolve Now better after initiation of allergy regimen Plan Continue  your Astelin spray , one spray , twice daily twice daily. Continue  Claritin to twice daily.>> Can try dropping back to once daily now that cough is better Start Gabapentin 100 mg at bedtime for cough . Do not drive if sleepy. I have sent in a refill. Please try weaning off this after surgery to see if you still need it for cough Continue GERD medications as you have been doing.  Sips of water instead of throat clearing Sugar Free Coca-Cola or Werther's originals for throat soothing. Delsym Cough syrup 5 cc's every 12 hours Follow up as needed.  Daisey Must with surgery. Please contact office for sooner follow up if symptoms do not improve or worsen or seek emergency care    Follow Up Instructions: Follow up with Dr. Isaiah Serge in 6 months    I discussed the assessment and treatment plan with the patient. The patient was provided an opportunity to ask questions and all were answered. The patient agreed with the plan and demonstrated an understanding  of the instructions.   The patient was advised to call back or seek an in-person evaluation if the symptoms worsen or if the condition fails to improve as anticipated.  I spent 37 minutes dedicated to the care of this patient on the date of this encounter to include pre-visit review of records, face-to-face time with the patient discussing conditions above, post visit ordering of testing, clinical documentation with the electronic health record, making appropriate referrals as documented, and communicating necessary information to the patient's healthcare team.    Bevelyn Ngo, NP 01/21/2023

## 2023-01-25 ENCOUNTER — Telehealth: Payer: Self-pay | Admitting: Physician Assistant

## 2023-01-25 NOTE — Telephone Encounter (Signed)
Please look into the following billing question below:   Has patient reached out to billing?    If no, please advise patient to reach out to the billing department at [phone].  If yes, what did billing advise the patient?    Is the patient calling in regard to a [employer] or [employer]?   If yes, please have patient reach out to [employer] or [employer] located on their bill received.   If patient was advised by [employer] or [facility] to reach out to the office, what did their billing department advise? Please have patient send copy of bill to the office.   Patient Name: Tracy Mckenzie MRN: [id number] [place] ID:   DOB: [birth year] Date of Service:  Several  Amount:    Additional Comments:  Pt was referred to Physical Therapy in our office. Per insurance verification, pt was to pay $35 per visit. Pt called in this morning to state insurance told her she should have only paid $20 per visit & is asking for her money back. Referring to billing depart.   Please advise patient that we do not handle billing in the practice.  We will submit this concern to billing leadership.  The patient will be followed up with either by Billing leadership or Billing Customer Service.

## 2023-02-02 ENCOUNTER — Other Ambulatory Visit: Payer: Self-pay | Admitting: Acute Care

## 2023-02-02 DIAGNOSIS — R053 Chronic cough: Secondary | ICD-10-CM

## 2023-02-04 NOTE — Patient Instructions (Signed)
SURGICAL WAITING ROOM VISITATION  Patients having surgery or a procedure may have no more than 2 support people in the waiting area - these visitors may rotate.    Children under the age of 51 must have an adult with them who is not the patient.  Due to an increase in RSV and influenza rates and associated hospitalizations, children ages 36 and under may not visit patients in [facility] hospitals.  If the patient needs to stay at the hospital during part of their recovery, the visitor guidelines for inpatient rooms apply. Pre-op nurse will coordinate an appropriate time for 1 support person to accompany patient in pre-op.  This support person may not rotate.    Please refer to the [facility] website for the visitor guidelines for Inpatients (after your surgery is over and you are in a regular room).    Your procedure is scheduled on: 02/14/23   Report to [facility] Main Entrance    Report to admitting at 8:00 AM   Call this number if you have problems the morning of surgery [phone]   Do not eat food :After Midnight.   After Midnight you may have the following liquids until 7:30 AM DAY OF SURGERY  Water Non-Citrus Juices (without pulp, NO RED-Apple, White grape, White cranberry) Black Coffee (NO MILK/CREAM OR CREAMERS, sugar ok)  Clear Tea (NO MILK/CREAM OR CREAMERS, sugar ok) regular and decaf                             Plain Jell-O (NO RED)                                           Fruit ices (not with fruit pulp, NO RED)                                     Popsicles (NO RED)                                                               Sports drinks like Gatorade (NO RED)               The day of surgery: Drink ONE (1) Pre-Surgery Clear Ensure at 7:30 AM the morning of surgery. Drink in one sitting. Do not sip.  This drink was given to you during your hospital  pre-op appointment visit. Nothing else to drink after completing the  Pre-Surgery Clear Ensure.           If you have questions, please contact your surgeon's office.   FOLLOW BOWEL PREP AND ANY ADDITIONAL PRE OP INSTRUCTIONS YOU RECEIVED FROM YOUR SURGEON'S OFFICE!!!     Oral Hygiene is also important to reduce your risk of infection.                                    Remember - BRUSH YOUR TEETH THE MORNING OF SURGERY WITH YOUR REGULAR TOOTHPASTE  DENTURES WILL BE REMOVED PRIOR  TO SURGERY PLEASE DO NOT APPLY "Poly grip" OR ADHESIVES!!!   Do NOT smoke after Midnight   Take these medicines the morning of surgery with A SIP OF WATER: Albuterol, Gabapentin, Metoprolol, Omeprazole   DO NOT TAKE ANY ORAL DIABETIC MEDICATIONS DAY OF YOUR SURGERY  Bring CPAP mask and tubing day of surgery.                              You may not have any metal on your body including hair pins, jewelry, and body piercing             Do not wear make-up, lotions, powders, perfumes, or deodorant  Do not wear nail polish including gel and S&S, artificial/acrylic nails, or any other type of covering on natural nails including finger and toenails. If you have artificial nails, gel coating, etc. that needs to be removed by a nail salon please have this removed prior to surgery or surgery may need to be canceled/ delayed if the surgeon/ anesthesia feels like they are unable to be safely monitored.   Do not shave  48 hours prior to surgery.    Do not bring valuables to the hospital. [facility] IS NOT             RESPONSIBLE   FOR VALUABLES.   Contacts, glasses, dentures or bridgework may not be worn into surgery.   Bring small overnight bag day of surgery.   DO NOT BRING YOUR HOME MEDICATIONS TO THE HOSPITAL. PHARMACY WILL DISPENSE MEDICATIONS LISTED ON YOUR MEDICATION LIST TO YOU DURING YOUR ADMISSION IN THE HOSPITAL!   Special Instructions: Bring a copy of your healthcare power of attorney and living will documents the day of surgery if you haven't scanned them before.              Please read over the  following fact sheets you were given: IF YOU HAVE QUESTIONS ABOUT YOUR PRE-OP INSTRUCTIONS PLEASE CALL [phone] [place]ras  If you received a COVID test during your pre-op visit  it is requested that you wear a mask when out in public, stay away from anyone that may not be feeling well and notify your surgeon if you develop symptoms. If you test positive for Covid or have been in contact with anyone that has tested positive in the last 10 days please notify you surgeon.      Pre-operative 5 CHG Bath Instructions   You can play a key role in reducing the risk of infection after surgery. Your skin needs to be as free of germs as possible. You can reduce the number of germs on your skin by washing with CHG (chlorhexidine gluconate) soap before surgery. CHG is an antiseptic soap that kills germs and continues to kill germs even after washing.   DO NOT use if you have an allergy to chlorhexidine/CHG or antibacterial soaps. If your skin becomes reddened or irritated, stop using the CHG and notify one of our RNs at [phone].   Please shower with the CHG soap starting 4 days before surgery using the following schedule:     Please keep in mind the following:  DO NOT shave, including legs and underarms, starting the day of your first shower.   You may shave your face at any point before/day of surgery.  Place clean sheets on your bed the day you start using CHG soap. Use a clean washcloth (not used since being washed)  for each shower. DO NOT sleep with pets once you start using the CHG.   CHG Shower Instructions:  If you choose to wash your hair and private area, wash first with your normal shampoo/soap.  After you use shampoo/soap, rinse your hair and body thoroughly to remove shampoo/soap residue.  Turn the water OFF and apply about 3 tablespoons (45 ml) of CHG soap to a CLEAN washcloth.  Apply CHG soap ONLY FROM YOUR NECK DOWN TO YOUR TOES (washing for 3-5 minutes)  DO NOT use CHG soap on  face, private areas, open wounds, or sores.  Pay special attention to the area where your surgery is being performed.  If you are having back surgery, having someone wash your back for you may be helpful. Wait 2 minutes after CHG soap is applied, then you may rinse off the CHG soap.  Pat dry with a clean towel  Put on clean clothes/pajamas   If you choose to wear lotion, please use ONLY the CHG-compatible lotions on the back of this paper.     Additional instructions for the day of surgery: DO NOT APPLY any lotions, deodorants, cologne, or perfumes.   Put on clean/comfortable clothes.  Brush your teeth.  Ask your nurse before applying any prescription medications to the skin.      CHG Compatible Lotions   Aveeno Moisturizing lotion  Cetaphil Moisturizing Cream  Cetaphil Moisturizing Lotion  Clairol Herbal Essence Moisturizing Lotion, Dry Skin  Clairol Herbal Essence Moisturizing Lotion, Extra Dry Skin  Clairol Herbal Essence Moisturizing Lotion, Normal Skin  Curel Age Defying Therapeutic Moisturizing Lotion with Alpha Hydroxy  Curel Extreme Care Body Lotion  Curel Soothing Hands Moisturizing Hand Lotion  Curel Therapeutic Moisturizing Cream, Fragrance-Free  Curel Therapeutic Moisturizing Lotion, Fragrance-Free  Curel Therapeutic Moisturizing Lotion, Original Formula  Eucerin Daily Replenishing Lotion  Eucerin Dry Skin Therapy Plus Alpha Hydroxy Crme  Eucerin Dry Skin Therapy Plus Alpha Hydroxy Lotion  Eucerin Original Crme  Eucerin Original Lotion  Eucerin Plus Crme Eucerin Plus Lotion  Eucerin TriLipid Replenishing Lotion  Keri Anti-Bacterial Hand Lotion  Keri Deep Conditioning Original Lotion Dry Skin Formula Softly Scented  Keri Deep Conditioning Original Lotion, Fragrance Free Sensitive Skin Formula  Keri Lotion Fast Absorbing Fragrance Free Sensitive Skin Formula  Keri Lotion Fast Absorbing Softly Scented Dry Skin Formula  Keri Original Lotion  Keri Skin Renewal  Lotion Keri Silky Smooth Lotion  Keri Silky Smooth Sensitive Skin Lotion  Nivea Body Creamy Conditioning Oil  Nivea Body Extra Enriched Lotion  Nivea Body Original Lotion  Nivea Body Sheer Moisturizing Lotion Nivea Crme  Nivea Skin Firming Lotion  NutraDerm 30 Skin Lotion  NutraDerm Skin Lotion  NutraDerm Therapeutic Skin Cream  NutraDerm Therapeutic Skin Lotion  ProShield Protective Hand Cream  Provon moisturizing lotion   Incentive Spirometer  An incentive spirometer is a tool that can help keep your lungs clear and active. This tool measures how well you are filling your lungs with each breath. Taking long deep breaths may help reverse or decrease the chance of developing breathing (pulmonary) problems (especially infection) following: A long period of time when you are unable to move or be active. BEFORE THE PROCEDURE  If the spirometer includes an indicator to show your best effort, your nurse or respiratory therapist will set it to a desired goal. If possible, sit up straight or lean slightly forward. Try not to slouch. Hold the incentive spirometer in an upright position. INSTRUCTIONS FOR USE  Sit on the edge  of your bed if possible, or sit up as far as you can in bed or on a chair. Hold the incentive spirometer in an upright position. Breathe out normally. Place the mouthpiece in your mouth and seal your lips tightly around it. Breathe in slowly and as deeply as possible, raising the piston or the ball toward the top of the column. Hold your breath for 3-5 seconds or for as long as possible. Allow the piston or ball to fall to the bottom of the column. Remove the mouthpiece from your mouth and breathe out normally. Rest for a few seconds and repeat Steps 1 through 7 at least 10 times every 1-2 hours when you are awake. Take your time and take a few normal breaths between deep breaths. The spirometer may include an indicator to show your best effort. Use the indicator as a goal  to work toward during each repetition. After each set of 10 deep breaths, practice coughing to be sure your lungs are clear. If you have an incision (the cut made at the time of surgery), support your incision when coughing by placing a pillow or rolled up towels firmly against it. Once you are able to get out of bed, walk around indoors and cough well. You may stop using the incentive spirometer when instructed by your caregiver.  RISKS AND COMPLICATIONS Take your time so you do not get dizzy or light-headed. If you are in pain, you may need to take or ask for pain medication before doing incentive spirometry. It is harder to take a deep breath if you are having pain. AFTER USE Rest and breathe slowly and easily. It can be helpful to keep track of a log of your progress. Your caregiver can provide you with a simple table to help with this. If you are using the spirometer at home, follow these instructions: SEEK MEDICAL CARE IF:  You are having difficultly using the spirometer. You have trouble using the spirometer as often as instructed. Your pain medication is not giving enough relief while using the spirometer. You develop fever of 100.5 F (38.1 C) or higher. SEEK IMMEDIATE MEDICAL CARE IF:  You cough up bloody sputum that had not been present before. You develop fever of 102 F (38.9 C) or greater. You develop worsening pain at or near the incision site. MAKE SURE YOU:  Understand these instructions. Will watch your condition. Will get help right away if you are not doing well or get worse. Document Released: 11/29/2006 Document Revised: 10/11/2011 Document Reviewed: 01/30/2007 [facility] Patient Information 2014 [place], [place].   ________________________________________________________________________

## 2023-02-07 ENCOUNTER — Encounter (HOSPITAL_COMMUNITY)
Admission: RE | Admit: 2023-02-07 | Discharge: 2023-02-07 | Disposition: A | Discharge status: Home / Self Care | Payer: Medicare HMO | Source: Ambulatory Visit | Attending: Orthopedic Surgery | Admitting: Orthopedic Surgery

## 2023-02-07 ENCOUNTER — Encounter (HOSPITAL_COMMUNITY): Payer: Self-pay

## 2023-02-07 ENCOUNTER — Other Ambulatory Visit: Payer: Self-pay

## 2023-02-07 VITALS — BP 138/88 | HR 62 | Temp 98.0°F | Resp 16 | Ht 66.0 in | Wt 177.0 lb

## 2023-02-07 DIAGNOSIS — Z01812 Encounter for preprocedural laboratory examination: Secondary | ICD-10-CM | POA: Insufficient documentation

## 2023-02-07 DIAGNOSIS — M1711 Unilateral primary osteoarthritis, right knee: Secondary | ICD-10-CM | POA: Insufficient documentation

## 2023-02-07 DIAGNOSIS — I10 Essential (primary) hypertension: Secondary | ICD-10-CM | POA: Diagnosis not present

## 2023-02-07 DIAGNOSIS — Z01818 Encounter for other preprocedural examination: Secondary | ICD-10-CM

## 2023-02-07 HISTORY — DX: Other specified postprocedural states: R11.2

## 2023-02-07 HISTORY — DX: Headache, unspecified: R51.9

## 2023-02-07 HISTORY — DX: Dyspnea, unspecified: R06.00

## 2023-02-07 HISTORY — DX: Anemia, unspecified: D64.9

## 2023-02-07 HISTORY — DX: Other specified postprocedural states: Z98.890

## 2023-02-07 HISTORY — DX: Elevated white blood cell count, unspecified: D72.829

## 2023-02-07 LAB — BASIC METABOLIC PANEL
Anion gap: 9 (ref 5–15)
BUN: 13 mg/dL (ref 8–23)
CO2: 27 mmol/L (ref 22–32)
Calcium: 9.7 mg/dL (ref 8.9–10.3)
Chloride: 105 mmol/L (ref 98–111)
Creatinine, Ser: 0.91 mg/dL (ref 0.44–1.00)
GFR, Estimated: >60 mL/min (ref >60)
Glucose, Bld: 102 mg/dL — ABNORMAL HIGH (ref 70–99)
Potassium: 4.1 mmol/L (ref 3.5–5.1)
Sodium: 141 mmol/L (ref 135–145)

## 2023-02-07 LAB — CBC
HCT: 41.3 % (ref 36.0–46.0)
Hemoglobin: 13.5 g/dL (ref 12.0–15.0)
MCH: 29.9 pg (ref 26.0–34.0)
MCHC: 32.7 g/dL (ref 30.0–36.0)
MCV: 91.4 fL (ref 80.0–100.0)
Platelets: 347 10*3/uL (ref 150–400)
RBC: 4.52 MIL/uL (ref 3.87–5.11)
RDW: 13.1 % (ref 11.5–15.5)
WBC: 12.7 10*3/uL — ABNORMAL HIGH (ref 4.0–10.5)
nRBC: 0 % (ref 0.0–0.2)

## 2023-02-07 LAB — SURGICAL PCR SCREEN
MRSA, PCR: NEGATIVE
Staphylococcus aureus: NEGATIVE

## 2023-02-07 NOTE — Progress Notes (Addendum)
Anesthesia Review:  [id number] Aliwardt clearance dated 01/07/23 on chart  Cardiologist : Dietrich Pates Cardiac clearance by Jari Favre 12/22/22 - LOV  P:ulm- DR Mannam- LOV Jefm Bryant, NP - Video visit 01/21/23  Hem/Onc- DR Leonides Schanz - LOV 08/06/20  Chest x-ray : 04/19/22  EKG : 12/22/22   Echo : 10/2022  Stress test: Cardiac Cath :  Activity levelcannot do a flight of stairs without difficulty   Sleep Study/ CPAP : none  Fasting Blood Sugar :      / Checks Blood Sugar -- times a day:   Blood Thinner/ Instructions /Last Dose: ASA / Instructions/ Last Dose :    Hgbaa1c- 5.8    CBC done 02/07/23 routed to DR Aluisio.

## 2023-02-08 NOTE — Progress Notes (Signed)
Anesthesia Chart Review   Case: [id number] Date/Time: 02/14/23 1015   Procedure: TOTAL KNEE ARTHROPLASTY (Right: Knee)   Anesthesia type: Choice   Pre-op diagnosis: right knee osteoarthritis   Location: [facility] ROOM 10 / [facility]   Surgeons: Ollen Gross, MD       DISCUSSION:67 y.o. never smoker with h/o PONV, right knee OA scheduled for above procedure 02/14/2023 with Dr. Ollen Gross.   Pt last seen by pulmonology 01/21/2023. Per OV note chronic cough is much better, stable at this visit.    Pt last seen by cardiology 12/22/22. Per OV note, "Ms. Karr's perioperative risk of a major cardiac event is 0.4% according to the Revised Cardiac Risk Index (RCRI).  Therefore, she is at low risk for perioperative complications.   Her functional capacity is fair at 4.3 METs according to the [facility] Activity Status Index (DASI). Recommendations: The patient requires an echocardiogram before a disposition can be made regarding surgical risk."  Anticipate pt can proceed with planned procedure barring acute status change.   VS: BP 138/88   Pulse 62   Temp 36.7 C (Oral)   Resp 16   Ht 5\' 6"  (1.676 m)   Wt 80.3 kg   SpO2 96%   BMI 28.57 kg/m   PROVIDERS: Allwardt, Crist Infante, PA-C is PCP   Dietrich Pates, MD is Cardiologist  LABS: Labs reviewed: Acceptable for surgery. (all labs ordered are listed, but only abnormal results are displayed)  Labs Reviewed  BASIC METABOLIC PANEL - Abnormal; Notable for the following components:      Result Value   Glucose, Bld 102 (*)    All other components within normal limits  CBC - Abnormal; Notable for the following components:   WBC 12.7 (*)    All other components within normal limits  SURGICAL PCR SCREEN     IMAGES:   EKG:   CV: Echo 12/24/2022 1. Probable liver cyst; suggest dedicated ultrasound to better assess.   2. Left ventricular ejection fraction, by estimation, is 60 to 65%. The  left ventricle has normal function. The left ventricle has  no regional  wall motion abnormalities. Left ventricular diastolic parameters are  consistent with Grade I diastolic  dysfunction (impaired relaxation).   3. Right ventricular systolic function is normal. The right ventricular  size is normal.   4. The mitral valve is normal in structure. Trivial mitral valve  regurgitation. No evidence of mitral stenosis.   5. The aortic valve is tricuspid. Aortic valve regurgitation is not  visualized. No aortic stenosis is present.   6. The inferior vena cava is normal in size with greater than 50%  respiratory variability, suggesting right atrial pressure of 3 mmHg.  Past Medical History:  Diagnosis Date   Anemia    Arthritis    on meds   Bleeding ulcer    Cataract    recently dx-bilaterally   Dyspnea    sob realted to allergies and cough   Elevated cholesterol    on meds   GERD (gastroesophageal reflux disease)    on meds   Headache    Hiatal hernia    High blood pressure    on meds   Kidney cysts    Leukocytosis    idiopathic   Liver cyst    Nerve pain    L sciatic pain radiating to R   Osteoporosis    reaction to Prolia=   Peptic ulcer disease    PONV (postoperative nausea and vomiting)  Seasonal allergies     Past Surgical History:  Procedure Laterality Date   APPENDECTOMY  1992   BREAST CYST ASPIRATION Left 2003   COLONOSCOPY  2014   [facility]-F/[id number]-HPP   KNEE ARTHROSCOPY Right 04/12/2001   torn cartilage   LAPAROSCOPY  1981   for endometriosis   LEG SURGERY Right 11/23/2000   to remove screws   LEG SURGERY Right 02/08/2002   to remove rod   MENISCUS REPAIR  01/29/2003   TIBIA FRACTURE SURGERY Right 06/12/2000   Multiple surgeries   TONSILLECTOMY  1967   TOTAL ABDOMINAL HYSTERECTOMY  1992   WISDOM TOOTH EXTRACTION      MEDICATIONS:  albuterol (VENTOLIN HFA) 108 (90 Base) MCG/ACT inhaler   azelastine (ASTELIN) 0.1 % nasal spray   baclofen (LIORESAL) 10 MG tablet   benzonatate (TESSALON) 100 MG capsule    BORON PO   Cholecalciferol (VITAMIN D3) 250 MCG (10000 UT) TABS   fluconazole (DIFLUCAN) 150 MG tablet   gabapentin (NEURONTIN) 100 MG capsule   gabapentin (NEURONTIN) 300 MG capsule   hydrochlorothiazide (MICROZIDE) 12.5 MG capsule   hydrOXYzine (ATARAX) 10 MG tablet   loratadine (CLARITIN) 10 MG tablet   losartan (COZAAR) 50 MG tablet   metoprolol tartrate (LOPRESSOR) 25 MG tablet   Misc Natural Products (RESVERATROL ULTRA PO)   montelukast (SINGULAIR) 10 MG tablet   Multiple Minerals-Vitamins (BONE DENSITY BUILDER PO)   nitroGLYCERIN (NITROSTAT) 0.4 MG SL tablet   omeprazole (PRILOSEC) 40 MG capsule   OVER THE COUNTER MEDICATION   OVER THE COUNTER MEDICATION   Propylene Glycol (SYSTANE COMPLETE) 0.6 % SOLN   Spacer/Aero-Holding Chambers (PRO COMFORT SPACER ADULT) MISC   zinc gluconate 50 MG tablet   No current facility-administered medications for this encounter.    Jodell Cipro Ward, PA-C [facility]-Surgical Testing [phone]

## 2023-02-08 NOTE — Anesthesia Preprocedure Evaluation (Addendum)
Anesthesia Evaluation  Patient identified by MRN, date of birth, ID band Patient awake    Reviewed: Allergy & Precautions, NPO status , Patient's Chart, lab work & pertinent test results  History of Anesthesia Complications (+) PONV and history of anesthetic complications  Airway Mallampati: II  TM Distance: >3 FB Neck ROM: Full    Dental  (+) Dental Advisory Given   Pulmonary neg pulmonary ROS   Pulmonary exam normal        Cardiovascular hypertension, Pt. on medications + CAD  Normal cardiovascular exam   '24 TTE - EF 60 to 65%. Grade I diastolic dysfunction (impaired relaxation). Trivial mitral valve regurgitation.     Neuro/Psych  Headaches  Neuromuscular disease  negative psych ROS   GI/Hepatic Neg liver ROS, hiatal hernia, PUD,GERD  Medicated and Controlled,,  Endo/Other  negative endocrine ROS    Renal/GU negative Renal ROS     Musculoskeletal  (+) Arthritis ,    Abdominal   Peds  Hematology negative hematology ROS (+)   Anesthesia Other Findings   Reproductive/Obstetrics                             Anesthesia Physical Anesthesia Plan  ASA: 2  Anesthesia Plan: Spinal   Post-op Pain Management: Tylenol PO (pre-op)* and Regional block*   Induction:   PONV Risk Score and Plan: 3 and Treatment may vary due to age or medical condition and Propofol infusion  Airway Management Planned: Natural Airway and Simple Face Mask  Additional Equipment: None  Intra-op Plan:   Post-operative Plan:   Informed Consent: I have reviewed the patients History and Physical, chart, labs and discussed the procedure including the risks, benefits and alternatives for the proposed anesthesia with the patient or authorized representative who has indicated his/her understanding and acceptance.       Plan Discussed with: CRNA and Anesthesiologist  Anesthesia Plan Comments: (See PAT note)        Anesthesia Quick Evaluation

## 2023-02-09 ENCOUNTER — Other Ambulatory Visit: Payer: Self-pay | Admitting: Physician Assistant

## 2023-02-09 ENCOUNTER — Encounter: Payer: Self-pay | Admitting: Physician Assistant

## 2023-02-09 ENCOUNTER — Ambulatory Visit (INDEPENDENT_AMBULATORY_CARE_PROVIDER_SITE_OTHER): Payer: Medicare HMO | Admitting: Physician Assistant

## 2023-02-09 VITALS — BP 128/72 | HR 73 | Temp 97.7°F | Ht 66.0 in | Wt 180.4 lb

## 2023-02-09 DIAGNOSIS — I10 Essential (primary) hypertension: Secondary | ICD-10-CM

## 2023-02-09 DIAGNOSIS — G5700 Lesion of sciatic nerve, unspecified lower limb: Secondary | ICD-10-CM | POA: Insufficient documentation

## 2023-02-09 DIAGNOSIS — M1991 Primary osteoarthritis, unspecified site: Secondary | ICD-10-CM | POA: Insufficient documentation

## 2023-02-09 DIAGNOSIS — R053 Chronic cough: Secondary | ICD-10-CM

## 2023-02-09 DIAGNOSIS — M1711 Unilateral primary osteoarthritis, right knee: Secondary | ICD-10-CM | POA: Diagnosis not present

## 2023-02-09 DIAGNOSIS — M5137 Other intervertebral disc degeneration, lumbosacral region: Secondary | ICD-10-CM | POA: Insufficient documentation

## 2023-02-09 DIAGNOSIS — M545 Low back pain, unspecified: Secondary | ICD-10-CM | POA: Insufficient documentation

## 2023-02-09 DIAGNOSIS — M5126 Other intervertebral disc displacement, lumbar region: Secondary | ICD-10-CM | POA: Insufficient documentation

## 2023-02-09 DIAGNOSIS — M719 Bursopathy, unspecified: Secondary | ICD-10-CM | POA: Insufficient documentation

## 2023-02-09 DIAGNOSIS — M51379 Other intervertebral disc degeneration, lumbosacral region without mention of lumbar back pain or lower extremity pain: Secondary | ICD-10-CM | POA: Insufficient documentation

## 2023-02-09 NOTE — Assessment & Plan Note (Signed)
Visit with pulmonology, their plan as follows has been working (copied from last visit with them): Continue  your Astelin spray , one spray , twice daily twice daily. Continue  Claritin to twice daily.>> Can try dropping back to once daily now that cough is better Start Gabapentin 100 mg at bedtime for cough . Do not drive if sleepy. Please try weaning off this after surgery to see if you still need it for cough Continue GERD medications as you have been doing.  Sips of water instead of throat clearing Sugar Free Coca-Cola or Werther's originals for throat soothing. Delsym Cough syrup 5 cc's every 12 hours

## 2023-02-09 NOTE — Progress Notes (Signed)
Subjective:    Patient ID: Tracy Mckenzie, female    DOB: [birth year], 67 y.o.   MRN: [id number]  Chief Complaint  Patient presents with   Medical Management of Chronic Issues    Pt in office for 6 mon f/u;    HPI Patient is in today for 6 month recheck. Anticipating TKA R knee upcoming on 02/14/23.   Pulmonology - workup negative, chronic cough thought to be postnasal drip - using nasal spray twice daily, Claritin twice daily; Gabapentin 100 mg at night to help with bedtime cough as well; pulmonary function testing was normal per patient.   Past Medical History:  Diagnosis Date   Anemia    Arthritis    on meds   Bleeding ulcer    Cataract    recently dx-bilaterally   Dyspnea    sob realted to allergies and cough   Elevated cholesterol    on meds   GERD (gastroesophageal reflux disease)    on meds   Headache    Hiatal hernia    High blood pressure    on meds   Kidney cysts    Leukocytosis    idiopathic   Liver cyst    Nerve pain    L sciatic pain radiating to R   Osteoporosis    reaction to Prolia=   Peptic ulcer disease    PONV (postoperative nausea and vomiting)    Seasonal allergies     Past Surgical History:  Procedure Laterality Date   APPENDECTOMY  1992   BREAST CYST ASPIRATION Left 2003   COLONOSCOPY  2014   [facility]   KNEE ARTHROSCOPY Right 04/12/2001   torn cartilage   LAPAROSCOPY  1981   for endometriosis   LEG SURGERY Right 11/23/2000   to remove screws   LEG SURGERY Right 02/08/2002   to remove rod   MENISCUS REPAIR  01/29/2003   TIBIA FRACTURE SURGERY Right 06/12/2000   Multiple surgeries   TONSILLECTOMY  1967   TOTAL ABDOMINAL HYSTERECTOMY  1992   WISDOM TOOTH EXTRACTION      Family History  Problem Relation Age of Onset   Kidney disease Mother    COPD Mother    COPD Father    Breast cancer Sister 16   Breast cancer Maternal Aunt    Breast cancer Paternal Aunt    Diabetes Maternal Grandfather    Stroke Paternal  Grandfather    Colon cancer Neg Hx    Colon polyps Neg Hx    Esophageal cancer Neg Hx    Rectal cancer Neg Hx    Stomach cancer Neg Hx     Social History   Tobacco Use   Smoking status: Never   Smokeless tobacco: Never  Vaping Use   Vaping Use: Never used  Substance Use Topics   Alcohol use: No   Drug use: No     Allergies  Allergen Reactions   Demerol Hcl [Meperidine] Anaphylaxis and Palpitations    flushed   Ibuprofen Other (See Comments)    Stomach ulcers.   Nsaids Other (See Comments)    Peptic ulcers   Simvastatin      Muscle Pain   Tape Other (See Comments)    "Sometimes removes skin"   Percocet [Oxycodone-Acetaminophen] Itching and Rash   Prolia [Denosumab] Rash   Vicodin [Hydrocodone-Acetaminophen] Itching    Review of Systems NEGATIVE UNLESS OTHERWISE INDICATED IN HPI      Objective:     BP 128/72 (BP Location: Left Arm,  Patient Position: Sitting)   Pulse 73   Temp 97.7 F (36.5 C) (Temporal)   Ht 5\' 6"  (1.676 m)   Wt 180 lb 6.4 oz (81.8 kg)   SpO2 96%   BMI 29.12 kg/m   Wt Readings from Last 3 Encounters:  02/09/23 180 lb 6.4 oz (81.8 kg)  02/07/23 177 lb (80.3 kg)  01/07/23 183 lb 6.4 oz (83.2 kg)    BP Readings from Last 3 Encounters:  02/09/23 128/72  02/07/23 138/88  01/07/23 130/78     Physical Exam Vitals and nursing note reviewed.  Constitutional:      Appearance: Normal appearance.  Cardiovascular:     Rate and Rhythm: Normal rate and regular rhythm.     Pulses: Normal pulses.  Pulmonary:     Effort: Pulmonary effort is normal.     Breath sounds: Normal breath sounds.  Neurological:     General: No focal deficit present.     Mental Status: She is alert and oriented to person, place, and time.  Psychiatric:        Mood and Affect: Mood normal.        Behavior: Behavior normal.        Assessment & Plan:  Essential hypertension Assessment & Plan: Normotensive Lopressor 25 mg BID Losartan 50 mg  HCTZ 12.5 mg     Chronic cough Assessment & Plan: Visit with pulmonology, their plan as follows has been working (copied from last visit with them): Continue  your Astelin spray , one spray , twice daily twice daily. Continue  Claritin to twice daily.>> Can try dropping back to once daily now that cough is better Start Gabapentin 100 mg at bedtime for cough . Do not drive if sleepy. Please try weaning off this after surgery to see if you still need it for cough Continue GERD medications as you have been doing.  Sips of water instead of throat clearing Sugar Free Coca-Cola or Werther's originals for throat soothing. Delsym Cough syrup 5 cc's every 12 hours   Arthritis of right knee  --TKA surgery on 02/14/23      Return in about 6 months (around 08/12/2023) for recheck/follow-up, blood pressure check.   Juda Lajeunesse M Treyana Sturgell, PA-C

## 2023-02-09 NOTE — Assessment & Plan Note (Signed)
Normotensive Lopressor 25 mg BID Losartan 50 mg  HCTZ 12.5 mg  

## 2023-02-14 ENCOUNTER — Encounter (HOSPITAL_COMMUNITY): Payer: Self-pay | Admitting: Orthopedic Surgery

## 2023-02-14 ENCOUNTER — Ambulatory Visit (HOSPITAL_COMMUNITY): Payer: Medicare HMO | Admitting: Physician Assistant

## 2023-02-14 ENCOUNTER — Ambulatory Visit (HOSPITAL_BASED_OUTPATIENT_CLINIC_OR_DEPARTMENT_OTHER): Payer: Medicare HMO | Admitting: Anesthesiology

## 2023-02-14 ENCOUNTER — Observation Stay (HOSPITAL_COMMUNITY)
Admission: RE | Admit: 2023-02-14 | Discharge: 2023-02-15 | Disposition: A | Discharge status: Home / Self Care | Payer: Medicare HMO | Attending: Orthopedic Surgery | Admitting: Orthopedic Surgery

## 2023-02-14 ENCOUNTER — Encounter (HOSPITAL_COMMUNITY)
Admission: RE | Disposition: A | Discharge status: Home / Self Care | Payer: Self-pay | Source: Home / Self Care | Attending: Orthopedic Surgery

## 2023-02-14 ENCOUNTER — Other Ambulatory Visit: Payer: Self-pay

## 2023-02-14 DIAGNOSIS — M1711 Unilateral primary osteoarthritis, right knee: Principal | ICD-10-CM

## 2023-02-14 DIAGNOSIS — G8918 Other acute postprocedural pain: Secondary | ICD-10-CM | POA: Diagnosis not present

## 2023-02-14 DIAGNOSIS — I10 Essential (primary) hypertension: Secondary | ICD-10-CM | POA: Diagnosis not present

## 2023-02-14 DIAGNOSIS — M179 Osteoarthritis of knee, unspecified: Principal | ICD-10-CM | POA: Diagnosis present

## 2023-02-14 DIAGNOSIS — Z79899 Other long term (current) drug therapy: Secondary | ICD-10-CM | POA: Insufficient documentation

## 2023-02-14 DIAGNOSIS — I2510 Atherosclerotic heart disease of native coronary artery without angina pectoris: Secondary | ICD-10-CM | POA: Diagnosis not present

## 2023-02-14 HISTORY — PX: TOTAL KNEE ARTHROPLASTY: SHX125

## 2023-02-14 SURGERY — ARTHROPLASTY, KNEE, TOTAL
Anesthesia: Spinal | Site: Knee | Laterality: Right

## 2023-02-14 MED ORDER — PROPOFOL 500 MG/50ML IV EMUL
INTRAVENOUS | Status: DC | PRN
  Administered 2023-02-14: 50 ug/kg/min via INTRAVENOUS

## 2023-02-14 MED ORDER — METOCLOPRAMIDE HCL 5 MG/ML IJ SOLN: 5.0000 mg | Freq: Three times a day (TID) | INTRAMUSCULAR | Status: DC | PRN

## 2023-02-14 MED ORDER — POLYVINYL ALCOHOL 1.4 % OP SOLN: 1.0000 [drp] | OPHTHALMIC | Status: DC | PRN

## 2023-02-14 MED ORDER — PROPOFOL 1000 MG/100ML IV EMUL
INTRAVENOUS | Status: AC
  Filled 2023-02-14: qty 100

## 2023-02-14 MED ORDER — LIDOCAINE HCL (PF) 2 % IJ SOLN
INTRAMUSCULAR | Status: AC
  Filled 2023-02-14: qty 10

## 2023-02-14 MED ORDER — BISACODYL 10 MG RE SUPP: 10.0000 mg | Freq: Every day | RECTAL | Status: DC | PRN

## 2023-02-14 MED ORDER — ALBUTEROL SULFATE (2.5 MG/3ML) 0.083% IN NEBU: 2.5000 mg | INHALATION_SOLUTION | Freq: Four times a day (QID) | RESPIRATORY_TRACT | Status: DC | PRN

## 2023-02-14 MED ORDER — HYDROMORPHONE HCL 2 MG PO TABS
1.0000 mg | ORAL_TABLET | ORAL | Status: DC | PRN
  Administered 2023-02-14 (×2): 1 mg via ORAL
  Administered 2023-02-15 (×2): 2 mg via ORAL
  Administered 2023-02-15: 1 mg via ORAL
  Filled 2023-02-14 (×5): qty 1

## 2023-02-14 MED ORDER — DEXAMETHASONE SODIUM PHOSPHATE 10 MG/ML IJ SOLN
10.0000 mg | Freq: Once | INTRAMUSCULAR | Status: AC
  Administered 2023-02-15: 10 mg via INTRAVENOUS
  Filled 2023-02-14: qty 1

## 2023-02-14 MED ORDER — FLEET ENEMA 7-19 GM/118ML RE ENEM: 1.0000 | ENEMA | Freq: Once | RECTAL | Status: DC | PRN

## 2023-02-14 MED ORDER — PHENOL 1.4 % MT LIQD: 1.0000 | OROMUCOSAL | Status: DC | PRN

## 2023-02-14 MED ORDER — GABAPENTIN 300 MG PO CAPS
300.0000 mg | ORAL_CAPSULE | Freq: Three times a day (TID) | ORAL | Status: DC
  Administered 2023-02-14 – 2023-02-15 (×3): 300 mg via ORAL
  Filled 2023-02-14 (×3): qty 1

## 2023-02-14 MED ORDER — HYDROCHLOROTHIAZIDE 12.5 MG PO TABS
12.5000 mg | ORAL_TABLET | Freq: Every day | ORAL | Status: DC
  Administered 2023-02-15: 12.5 mg via ORAL
  Filled 2023-02-14: qty 1

## 2023-02-14 MED ORDER — TRAMADOL HCL 50 MG PO TABS
ORAL_TABLET | ORAL | Status: AC
  Filled 2023-02-14: qty 1

## 2023-02-14 MED ORDER — ONDANSETRON HCL 4 MG/2ML IJ SOLN
INTRAMUSCULAR | Status: AC
  Filled 2023-02-14: qty 2

## 2023-02-14 MED ORDER — PANTOPRAZOLE SODIUM 40 MG PO TBEC
80.0000 mg | DELAYED_RELEASE_TABLET | Freq: Every day | ORAL | Status: DC
  Administered 2023-02-15: 80 mg via ORAL
  Filled 2023-02-14: qty 2

## 2023-02-14 MED ORDER — ONDANSETRON HCL 4 MG/2ML IJ SOLN
INTRAMUSCULAR | Status: DC | PRN
  Administered 2023-02-14: 4 mg via INTRAVENOUS

## 2023-02-14 MED ORDER — RIVAROXABAN 10 MG PO TABS
10.0000 mg | ORAL_TABLET | Freq: Every day | ORAL | Status: DC
  Administered 2023-02-15: 10 mg via ORAL
  Filled 2023-02-14: qty 1

## 2023-02-14 MED ORDER — METOCLOPRAMIDE HCL 5 MG PO TABS: 5.0000 mg | ORAL_TABLET | Freq: Three times a day (TID) | ORAL | Status: DC | PRN

## 2023-02-14 MED ORDER — HYDROXYZINE HCL 10 MG PO TABS
10.0000 mg | ORAL_TABLET | Freq: Two times a day (BID) | ORAL | Status: DC | PRN
  Administered 2023-02-14: 10 mg via ORAL
  Filled 2023-02-14 (×2): qty 1

## 2023-02-14 MED ORDER — METOPROLOL TARTRATE 25 MG PO TABS
25.0000 mg | ORAL_TABLET | Freq: Two times a day (BID) | ORAL | Status: DC
  Administered 2023-02-14 – 2023-02-15 (×2): 25 mg via ORAL
  Filled 2023-02-14 (×2): qty 1

## 2023-02-14 MED ORDER — SODIUM CHLORIDE 0.9 % IV SOLN: INTRAVENOUS | Status: DC

## 2023-02-14 MED ORDER — MONTELUKAST SODIUM 10 MG PO TABS
10.0000 mg | ORAL_TABLET | Freq: Every day | ORAL | Status: DC
  Administered 2023-02-14: 10 mg via ORAL
  Filled 2023-02-14: qty 1

## 2023-02-14 MED ORDER — BENZONATATE 100 MG PO CAPS: 100.0000 mg | ORAL_CAPSULE | Freq: Two times a day (BID) | ORAL | Status: DC | PRN

## 2023-02-14 MED ORDER — ONDANSETRON HCL 4 MG/2ML IJ SOLN: 4.0000 mg | Freq: Once | INTRAMUSCULAR | Status: DC | PRN

## 2023-02-14 MED ORDER — DEXAMETHASONE SODIUM PHOSPHATE 10 MG/ML IJ SOLN
8.0000 mg | Freq: Once | INTRAMUSCULAR | Status: AC
  Administered 2023-02-14: 8 mg via INTRAVENOUS

## 2023-02-14 MED ORDER — FENTANYL CITRATE PF 50 MCG/ML IJ SOSY
PREFILLED_SYRINGE | INTRAMUSCULAR | Status: AC
  Administered 2023-02-14: 50 ug via INTRAVENOUS
  Filled 2023-02-14: qty 1

## 2023-02-14 MED ORDER — HYDROXYZINE HCL 10 MG PO TABS: 10.0000 mg | ORAL_TABLET | Freq: Two times a day (BID) | ORAL | Status: DC

## 2023-02-14 MED ORDER — PHENYLEPHRINE HCL-NACL 20-0.9 MG/250ML-% IV SOLN
INTRAVENOUS | Status: DC | PRN
  Administered 2023-02-14: 20 ug/min via INTRAVENOUS

## 2023-02-14 MED ORDER — METHOCARBAMOL 500 MG IVPB - SIMPLE MED
500.0000 mg | Freq: Four times a day (QID) | INTRAVENOUS | Status: DC | PRN
  Administered 2023-02-14: 500 mg via INTRAVENOUS

## 2023-02-14 MED ORDER — LACTATED RINGERS IV SOLN: INTRAVENOUS | Status: DC

## 2023-02-14 MED ORDER — CEFAZOLIN SODIUM-DEXTROSE 2-4 GM/100ML-% IV SOLN
2.0000 g | INTRAVENOUS | Status: AC
  Administered 2023-02-14: 2 g via INTRAVENOUS
  Filled 2023-02-14: qty 100

## 2023-02-14 MED ORDER — TRANEXAMIC ACID-NACL 1000-0.7 MG/100ML-% IV SOLN
1000.0000 mg | INTRAVENOUS | Status: AC
  Administered 2023-02-14: 1000 mg via INTRAVENOUS
  Filled 2023-02-14: qty 100

## 2023-02-14 MED ORDER — HYDROXYZINE HCL 10 MG PO TABS
10.0000 mg | ORAL_TABLET | Freq: Once | ORAL | Status: AC
  Administered 2023-02-14: 10 mg via ORAL
  Filled 2023-02-14: qty 1

## 2023-02-14 MED ORDER — FENTANYL CITRATE PF 50 MCG/ML IJ SOSY
25.0000 ug | PREFILLED_SYRINGE | INTRAMUSCULAR | Status: DC | PRN
  Administered 2023-02-14: 50 ug via INTRAVENOUS

## 2023-02-14 MED ORDER — NITROGLYCERIN 0.4 MG SL SUBL: 0.4000 mg | SUBLINGUAL_TABLET | SUBLINGUAL | Status: DC | PRN

## 2023-02-14 MED ORDER — POLYETHYLENE GLYCOL 3350 17 G PO PACK: 17.0000 g | PACK | Freq: Every day | ORAL | Status: DC | PRN

## 2023-02-14 MED ORDER — SODIUM CHLORIDE (PF) 0.9 % IJ SOLN
INTRAMUSCULAR | Status: AC
  Filled 2023-02-14: qty 10

## 2023-02-14 MED ORDER — STERILE WATER FOR IRRIGATION IR SOLN
Status: DC | PRN
  Administered 2023-02-14: 2000 mL

## 2023-02-14 MED ORDER — BUPIVACAINE IN DEXTROSE 0.75-8.25 % IT SOLN
INTRATHECAL | Status: DC | PRN
  Administered 2023-02-14: 1.6 mL via INTRATHECAL

## 2023-02-14 MED ORDER — ONDANSETRON HCL 4 MG PO TABS: 4.0000 mg | ORAL_TABLET | Freq: Four times a day (QID) | ORAL | Status: DC | PRN

## 2023-02-14 MED ORDER — MIDAZOLAM HCL 2 MG/2ML IJ SOLN
2.0000 mg | Freq: Once | INTRAMUSCULAR | Status: AC
  Administered 2023-02-14: 1 mg via INTRAVENOUS
  Filled 2023-02-14: qty 2

## 2023-02-14 MED ORDER — ONDANSETRON HCL 4 MG/2ML IJ SOLN
4.0000 mg | Freq: Four times a day (QID) | INTRAMUSCULAR | Status: DC | PRN
  Administered 2023-02-14: 4 mg via INTRAVENOUS
  Filled 2023-02-14: qty 2

## 2023-02-14 MED ORDER — SODIUM CHLORIDE (PF) 0.9 % IJ SOLN
INTRAMUSCULAR | Status: AC
  Filled 2023-02-14: qty 50

## 2023-02-14 MED ORDER — BUPIVACAINE LIPOSOME 1.3 % IJ SUSP: 20.0000 mL | Freq: Once | INTRAMUSCULAR | Status: DC

## 2023-02-14 MED ORDER — CHLORHEXIDINE GLUCONATE 0.12 % MT SOLN
15.0000 mL | Freq: Once | OROMUCOSAL | Status: AC
  Administered 2023-02-14: 15 mL via OROMUCOSAL

## 2023-02-14 MED ORDER — AZELASTINE HCL 0.1 % NA SOLN
1.0000 | Freq: Two times a day (BID) | NASAL | Status: DC
  Administered 2023-02-14 – 2023-02-15 (×2): 1 via NASAL
  Filled 2023-02-14: qty 30

## 2023-02-14 MED ORDER — ROPIVACAINE HCL 7.5 MG/ML IJ SOLN
INTRAMUSCULAR | Status: DC | PRN
  Administered 2023-02-14: 20 mL via PERINEURAL

## 2023-02-14 MED ORDER — POVIDONE-IODINE 10 % EX SWAB
2.0000 | Freq: Once | CUTANEOUS | Status: AC
  Administered 2023-02-14: 2 via TOPICAL

## 2023-02-14 MED ORDER — BUPIVACAINE LIPOSOME 1.3 % IJ SUSP
INTRAMUSCULAR | Status: AC
  Filled 2023-02-14: qty 20

## 2023-02-14 MED ORDER — ACETAMINOPHEN 500 MG PO TABS: 1000.0000 mg | ORAL_TABLET | Freq: Once | ORAL | Status: DC

## 2023-02-14 MED ORDER — LOSARTAN POTASSIUM 50 MG PO TABS
50.0000 mg | ORAL_TABLET | Freq: Two times a day (BID) | ORAL | Status: DC
  Administered 2023-02-15: 50 mg via ORAL
  Filled 2023-02-14: qty 1

## 2023-02-14 MED ORDER — ACETAMINOPHEN 500 MG PO TABS
1000.0000 mg | ORAL_TABLET | Freq: Four times a day (QID) | ORAL | Status: AC
  Administered 2023-02-14 – 2023-02-15 (×4): 1000 mg via ORAL
  Filled 2023-02-14 (×4): qty 2

## 2023-02-14 MED ORDER — BUPIVACAINE LIPOSOME 1.3 % IJ SUSP
INTRAMUSCULAR | Status: DC | PRN
  Administered 2023-02-14: 20 mL

## 2023-02-14 MED ORDER — ACETAMINOPHEN 10 MG/ML IV SOLN
1000.0000 mg | Freq: Four times a day (QID) | INTRAVENOUS | Status: DC
  Filled 2023-02-14: qty 100

## 2023-02-14 MED ORDER — SODIUM CHLORIDE (PF) 0.9 % IJ SOLN
INTRAMUSCULAR | Status: DC | PRN
  Administered 2023-02-14: 60 mL

## 2023-02-14 MED ORDER — METHOCARBAMOL 500 MG PO TABS
500.0000 mg | ORAL_TABLET | Freq: Four times a day (QID) | ORAL | Status: DC | PRN
  Administered 2023-02-15: 500 mg via ORAL
  Filled 2023-02-14: qty 1

## 2023-02-14 MED ORDER — LACTATED RINGERS IV SOLN: INTRAVENOUS | Status: DC | PRN

## 2023-02-14 MED ORDER — TRAMADOL HCL 50 MG PO TABS
50.0000 mg | ORAL_TABLET | Freq: Four times a day (QID) | ORAL | Status: DC | PRN
  Administered 2023-02-14 – 2023-02-15 (×2): 50 mg via ORAL
  Filled 2023-02-14: qty 1

## 2023-02-14 MED ORDER — PHENYLEPHRINE HCL-NACL 20-0.9 MG/250ML-% IV SOLN
INTRAVENOUS | Status: AC
  Filled 2023-02-14: qty 250

## 2023-02-14 MED ORDER — LORATADINE 10 MG PO TABS
10.0000 mg | ORAL_TABLET | Freq: Two times a day (BID) | ORAL | Status: DC
  Administered 2023-02-14 – 2023-02-15 (×2): 10 mg via ORAL
  Filled 2023-02-14 (×2): qty 1

## 2023-02-14 MED ORDER — CEFAZOLIN SODIUM-DEXTROSE 2-4 GM/100ML-% IV SOLN
2.0000 g | Freq: Four times a day (QID) | INTRAVENOUS | Status: AC
  Administered 2023-02-14 – 2023-02-15 (×2): 2 g via INTRAVENOUS
  Filled 2023-02-14 (×2): qty 100

## 2023-02-14 MED ORDER — DOCUSATE SODIUM 100 MG PO CAPS
100.0000 mg | ORAL_CAPSULE | Freq: Two times a day (BID) | ORAL | Status: DC
  Administered 2023-02-14 – 2023-02-15 (×2): 100 mg via ORAL
  Filled 2023-02-14 (×2): qty 1

## 2023-02-14 MED ORDER — DEXAMETHASONE SODIUM PHOSPHATE 10 MG/ML IJ SOLN
INTRAMUSCULAR | Status: AC
  Filled 2023-02-14: qty 1

## 2023-02-14 MED ORDER — 0.9 % SODIUM CHLORIDE (POUR BTL) OPTIME
TOPICAL | Status: DC | PRN
  Administered 2023-02-14: 1000 mL

## 2023-02-14 MED ORDER — METHOCARBAMOL 500 MG IVPB - SIMPLE MED
INTRAVENOUS | Status: AC
  Filled 2023-02-14: qty 55

## 2023-02-14 MED ORDER — ORAL CARE MOUTH RINSE: 15.0000 mL | Freq: Once | OROMUCOSAL | Status: AC

## 2023-02-14 MED ORDER — MENTHOL 3 MG MT LOZG: 1.0000 | LOZENGE | OROMUCOSAL | Status: DC | PRN

## 2023-02-14 MED ORDER — DIPHENHYDRAMINE HCL 12.5 MG/5ML PO ELIX: 12.5000 mg | ORAL_SOLUTION | ORAL | Status: DC | PRN

## 2023-02-14 MED ORDER — MORPHINE SULFATE (PF) 2 MG/ML IV SOLN: 1.0000 mg | INTRAVENOUS | Status: DC | PRN

## 2023-02-14 MED ORDER — FENTANYL CITRATE PF 50 MCG/ML IJ SOSY
PREFILLED_SYRINGE | INTRAMUSCULAR | Status: AC
  Administered 2023-02-14: 50 ug via INTRAVENOUS
  Filled 2023-02-14: qty 2

## 2023-02-14 MED ORDER — SODIUM CHLORIDE 0.9 % IR SOLN
Status: DC | PRN
  Administered 2023-02-14: 1000 mL

## 2023-02-14 SURGICAL SUPPLY — 59 items
ADH SKN CLS APL DERMABOND .7 (GAUZE/BANDAGES/DRESSINGS) ×1
ATTUNE PS FEM RT SZ 5 CEM KNEE (Femur) IMPLANT
ATTUNE PSRP INSR SZ5 6 KNEE (Insert) IMPLANT
BAG COUNTER SPONGE SURGICOUNT (BAG) IMPLANT
BAG SPEC THK2 15X12 ZIP CLS (MISCELLANEOUS) ×1
BAG SPNG CNTER NS LX DISP (BAG) ×1
BAG ZIPLOCK 12X15 (MISCELLANEOUS) ×1 IMPLANT
BASE TIBIAL ROT PLAT SZ 5 KNEE (Knees) IMPLANT
BLADE SAG 18X100X1.27 (BLADE) ×1 IMPLANT
BLADE SAW SGTL 11.0X1.19X90.0M (BLADE) ×1 IMPLANT
BNDG CMPR 5X62 HK CLSR LF (GAUZE/BANDAGES/DRESSINGS) ×1
BNDG CMPR 6 X 5 YARDS HK CLSR (GAUZE/BANDAGES/DRESSINGS) ×1
BNDG CMPR 6"X 5 YARDS HK CLSR (GAUZE/BANDAGES/DRESSINGS) ×1
BNDG CMPR MED 10X6 ELC LF (GAUZE/BANDAGES/DRESSINGS) ×1
BNDG ELASTIC 6INX 5YD STR LF (GAUZE/BANDAGES/DRESSINGS) ×1 IMPLANT
BNDG ELASTIC 6X10 VLCR STRL LF (GAUZE/BANDAGES/DRESSINGS) IMPLANT
BOWL SMART MIX CTS (DISPOSABLE) ×1 IMPLANT
BSPLAT TIB 5 CMNT ROT PLAT STR (Knees) ×1 IMPLANT
CEMENT HV SMART SET (Cement) ×2 IMPLANT
COVER SURGICAL LIGHT HANDLE (MISCELLANEOUS) ×1 IMPLANT
CUFF TOURN SGL QUICK 34 (TOURNIQUET CUFF) ×1
CUFF TRNQT CYL 34X4.125X (TOURNIQUET CUFF) ×1 IMPLANT
DERMABOND ADVANCED .7 DNX12 (GAUZE/BANDAGES/DRESSINGS) ×1 IMPLANT
DRAPE INCISE IOBAN 66X45 STRL (DRAPES) ×1 IMPLANT
DRAPE U-SHAPE 47X51 STRL (DRAPES) ×1 IMPLANT
DRSG AQUACEL AG ADV 3.5X10 (GAUZE/BANDAGES/DRESSINGS) ×1 IMPLANT
DURAPREP 26ML APPLICATOR (WOUND CARE) ×1 IMPLANT
ELECT REM PT RETURN 15FT ADLT (MISCELLANEOUS) ×1 IMPLANT
GLOVE BIO SURGEON STRL SZ 6.5 (GLOVE) IMPLANT
GLOVE BIO SURGEON STRL SZ8 (GLOVE) ×1 IMPLANT
GLOVE BIOGEL PI IND STRL 6.5 (GLOVE) IMPLANT
GLOVE BIOGEL PI IND STRL 7.0 (GLOVE) IMPLANT
GLOVE BIOGEL PI IND STRL 8 (GLOVE) ×1 IMPLANT
GOWN STRL REUS W/ TWL LRG LVL3 (GOWN DISPOSABLE) ×1 IMPLANT
GOWN STRL REUS W/TWL LRG LVL3 (GOWN DISPOSABLE) ×1
HANDPIECE INTERPULSE COAX TIP (DISPOSABLE) ×1
HOLDER FOLEY CATH W/STRAP (MISCELLANEOUS) IMPLANT
IMMOBILIZER KNEE 20 (SOFTGOODS) ×1
IMMOBILIZER KNEE 20 THIGH 36 (SOFTGOODS) ×1 IMPLANT
KIT TURNOVER KIT A (KITS) IMPLANT
MANIFOLD NEPTUNE II (INSTRUMENTS) ×1 IMPLANT
NS IRRIG 1000ML POUR BTL (IV SOLUTION) ×1 IMPLANT
PACK TOTAL KNEE CUSTOM (KITS) ×1 IMPLANT
PADDING CAST COTTON 6X4 STRL (CAST SUPPLIES) ×2 IMPLANT
PATELLA MEDIAL ATTUN 35MM KNEE (Knees) IMPLANT
PIN STEINMAN FIXATION KNEE (PIN) IMPLANT
PROTECTOR NERVE ULNAR (MISCELLANEOUS) ×1 IMPLANT
SET HNDPC FAN SPRY TIP SCT (DISPOSABLE) ×1 IMPLANT
SPIKE FLUID TRANSFER (MISCELLANEOUS) ×1 IMPLANT
SUT MNCRL AB 4-0 PS2 18 (SUTURE) ×1 IMPLANT
SUT STRATAFIX 0 PDS 27 VIOLET (SUTURE) ×1
SUT VIC AB 2-0 CT1 27 (SUTURE) ×3
SUT VIC AB 2-0 CT1 TAPERPNT 27 (SUTURE) ×3 IMPLANT
SUTURE STRATFX 0 PDS 27 VIOLET (SUTURE) ×1 IMPLANT
TIBIAL BASE ROT PLAT SZ 5 KNEE (Knees) ×1 IMPLANT
TRAY FOLEY MTR SLVR 16FR STAT (SET/KITS/TRAYS/PACK) ×1 IMPLANT
TUBE SUCTION HIGH CAP CLEAR NV (SUCTIONS) ×1 IMPLANT
WATER STERILE IRR 1000ML POUR (IV SOLUTION) ×2 IMPLANT
WRAP KNEE MAXI GEL POST OP (GAUZE/BANDAGES/DRESSINGS) ×1 IMPLANT

## 2023-02-14 NOTE — Care Plan (Signed)
Ortho Bundle Case Management Note  Patient Details  Name: Tracy Mckenzie MRN: [id number] Date of Birth: [birth year]                  R TKA on 02/14/23. DCP: Home with husband.  DME: RW ordered through [facility].  PT: EO 7/19 at 10:15   DME Arranged:  Dan Humphreys rolling DME Agency:  [employer]    Additional Comments: Please contact me with any questions of if this plan should need to change.   Ennis Forts, RN,CCM [facility]  [phone] 02/14/2023, 11:54 AM

## 2023-02-14 NOTE — Transfer of Care (Signed)
Immediate Anesthesia Transfer of Care Note  Patient: Tracy Mckenzie  Procedure(s) Performed: TOTAL KNEE ARTHROPLASTY (Right: Knee)  Patient Location: PACU  Anesthesia Type:MAC combined with regional for post-op pain  Level of Consciousness: awake and alert   Airway & Oxygen Therapy: Patient Spontanous Breathing and Patient connected to face mask oxygen  Post-op Assessment: Report given to RN and Post -op Vital signs reviewed and stable  Post vital signs: Reviewed and stable  Last Vitals:  Vitals Value Taken Time  BP 108/53 02/14/23 1209  Temp    Pulse 74 02/14/23 1215  Resp 11 02/14/23 1215  SpO2 100 % 02/14/23 1215  Vitals shown include unfiled device data.  Last Pain:  Vitals:   02/14/23 1010  TempSrc:   PainSc: 0-No pain         Complications: No notable events documented.

## 2023-02-14 NOTE — Anesthesia Postprocedure Evaluation (Signed)
Anesthesia Post Note  Patient: Tracy Mckenzie  Procedure(s) Performed: TOTAL KNEE ARTHROPLASTY (Right: Knee)     Patient location during evaluation: PACU Anesthesia Type: Spinal Level of consciousness: awake and alert Pain management: pain level controlled Vital Signs Assessment: post-procedure vital signs reviewed and stable Respiratory status: spontaneous breathing and respiratory function stable Cardiovascular status: blood pressure returned to baseline and stable Postop Assessment: spinal receding and no apparent nausea or vomiting Anesthetic complications: no   No notable events documented.  Last Vitals:  Vitals:   02/14/23 1318 02/14/23 1330  BP:  127/76  Pulse: 72 70  Resp: 13 13  Temp:    SpO2: 100% 99%    Last Pain:  Vitals:   02/14/23 1318  TempSrc:   PainSc: 5                  Beryle Lathe

## 2023-02-14 NOTE — Op Note (Signed)
OPERATIVE REPORT-TOTAL KNEE ARTHROPLASTY   Pre-operative diagnosis- Osteoarthritis  Right knee(s)  Post-operative diagnosis- Osteoarthritis Right knee(s)  Procedure-  Right  Total Knee Arthroplasty  Surgeon- Tracy Mckenzie. Nakiea Metzner, MD  Assistant- Arther Abbott, PA-C   Anesthesia-   Adductor canal block and spinal  EBL- 25 ml   Drains None  Tourniquet time-  Total Tourniquet Time Documented: Thigh (Right) - 40 minutes Total: Thigh (Right) - 40 minutes     Complications- None  Condition-PACU - hemodynamically stable.   Brief Clinical Note  Tracy Mckenzie is a 67 y.o. year old female with end stage OA of her right knee with progressively worsening pain and dysfunction. She has constant pain, with activity and at rest and significant functional deficits with difficulties even with ADLs. She has had extensive non-op management including analgesics, injections of cortisone and viscosupplements, and home exercise program, but remains in significant pain with significant dysfunction.Radiographs show bone on bone arthritis lateral and patellofemoral with valgus deformity. She presents now for right Total Knee Arthroplasty.     Procedure in detail---   The patient is brought into the operating room and positioned supine on the operating table. After successful administration of  Adductor canal block and spinal,   a tourniquet is placed high on the  Right thigh(s) and the lower extremity is prepped and draped in the usual sterile fashion. Time out is performed by the operating team and then the  Right lower extremity is wrapped in Esmarch, knee flexed and the tourniquet inflated to 300 mmHg.       A midline incision is made with a ten blade through the subcutaneous tissue to the level of the extensor mechanism. A fresh blade is used to make a medial parapatellar arthrotomy. Soft tissue over the proximal medial tibia is subperiosteally elevated to the joint line with a knife and into the  semimembranosus bursa with a Cobb elevator. Soft tissue over the proximal lateral tibia is elevated with attention being paid to avoiding the patellar tendon on the tibial tubercle. The patella is everted, knee flexed 90 degrees and the ACL and PCL are removed. Findings are bone on  bone lateral and patellofemoral with large global osteophytes        The drill is used to create a starting hole in the distal femur and the canal is thoroughly irrigated with sterile saline to remove the fatty contents. The 5 degree Right  valgus alignment guide is placed into the femoral canal and the distal femoral cutting block is pinned to remove 9 mm off the distal femur. Resection is made with an oscillating saw.      The tibia is subluxed forward and the menisci are removed. The extramedullary alignment guide is placed referencing proximally at the medial aspect of the tibial tubercle and distally along the second metatarsal axis and tibial crest. The block is pinned to remove 2mm off the more deficient lateral  side. Resection is made with an oscillating saw. Size 5is the most appropriate size for the tibia and the proximal tibia is prepared with the modular drill and keel punch for that size.      The femoral sizing guide is placed and size 5 is most appropriate. Rotation is marked off the epicondylar axis and confirmed by creating a rectangular flexion gap at 90 degrees. The size 5 cutting block is pinned in this rotation and the anterior, posterior and chamfer cuts are made with the oscillating saw. The intercondylar block is then placed and  that cut is made.      Trial size 5 tibial component, trial size 5 posterior stabilized femur and a 6  mm posterior stabilized rotating platform insert trial is placed. Full extension is achieved with excellent varus/valgus and anterior/posterior balance throughout full range of motion. The patella is everted and thickness measured to be 22  mm. Free hand resection is taken to 12 mm,  a 35 template is placed, lug holes are drilled, trial patella is placed, and it tracks normally. Osteophytes are removed off the posterior femur with the trial in place. All trials are removed and the cut bone surfaces prepared with pulsatile lavage. Cement is mixed and once ready for implantation, the size 5 tibial implant, size  5 posterior stabilized femoral component, and the size 35 patella are cemented in place and the patella is held with the clamp. The trial insert is placed and the knee held in full extension. The Exparel (20 ml mixed with 60 ml saline) is injected into the extensor mechanism, posterior capsule, medial and lateral gutters and subcutaneous tissues.  All extruded cement is removed and once the cement is hard the permanent 6 mm posterior stabilized rotating platform insert is placed into the tibial tray.      The wound is copiously irrigated with saline solution and the extensor mechanism closed with # 0 Stratofix suture. The tourniquet is released for a total tourniquet time of 40  minutes. Flexion against gravity is 140 degrees and the patella tracks normally. Subcutaneous tissue is closed with 2.0 vicryl and subcuticular with running 4.0 Monocryl. The incision is cleaned and dried and steri-strips and a bulky sterile dressing are applied. The limb is placed into a knee immobilizer and the patient is awakened and transported to recovery in stable condition.      Please note that a surgical assistant was a medical necessity for this procedure in order to perform it in a safe and expeditious manner. Surgical assistant was necessary to retract the ligaments and vital neurovascular structures to prevent injury to them and also necessary for proper positioning of the limb to allow for anatomic placement of the prosthesis.   Tracy Mckenzie Tracy Altier, MD    02/14/2023, 11:48 AM

## 2023-02-14 NOTE — Anesthesia Procedure Notes (Signed)
Anesthesia Regional Block: Adductor canal block   Pre-Anesthetic Checklist: , timeout performed,  Correct Patient, Correct Site, Correct Laterality,  Correct Procedure, Correct Position, site marked,  Risks and benefits discussed,  Surgical consent,  Pre-op evaluation,  At surgeon's request and post-op pain management  Laterality: Right  Prep: chloraprep       Needles:  Injection technique: Single-shot  Needle Type: Echogenic Needle     Needle Length: 10cm  Needle Gauge: 21     Additional Needles:   Narrative:  Start time: 02/14/2023 9:57 AM End time: 02/14/2023 10:00 AM Injection made incrementally with aspirations every 5 mL.  Performed by: Personally  Anesthesiologist: Beryle Lathe, MD  Additional Notes: No pain on injection. No increased resistance to injection. Injection made in 5cc increments. Good needle visualization. Patient tolerated the procedure well.

## 2023-02-14 NOTE — Progress Notes (Signed)
Pt c/o feeling like she needs to release her urine with foley in. NT emptied of urine from foley and is not getting any additional urine to drain. This RN and night RN adjusted foley to assist with drainage. No additional output with adjustment of foley. RN deflated and inflated balloon. Continues to have no urine output. RN removed foley. Pt on BSC to try to urinate. RN nurse to assess urinary output after pt gets off BSC.

## 2023-02-14 NOTE — Anesthesia Procedure Notes (Signed)
Spinal  Patient location during procedure: OR Start time: 02/14/2023 10:41 AM End time: 02/14/2023 10:44 AM Reason for block: surgical anesthesia Staffing Performed: anesthesiologist  Anesthesiologist: Beryle Lathe, MD Performed by: Beryle Lathe, MD Authorized by: Beryle Lathe, MD   Preanesthetic Checklist Completed: patient identified, IV checked, risks and benefits discussed, surgical consent, monitors and equipment checked, pre-op evaluation and timeout performed Spinal Block Patient position: sitting Prep: DuraPrep Patient monitoring: heart rate, cardiac monitor, continuous pulse ox and blood pressure Approach: midline Location: L3-4 Injection technique: single-shot Needle Needle type: Pencan  Needle gauge: 24 G Additional Notes Consent was obtained prior to the procedure with all questions answered and concerns addressed. Risks including, but not limited to, bleeding, infection, nerve damage, paralysis, failed block, inadequate analgesia, allergic reaction, high spinal, itching, and headache were discussed and the patient wished to proceed. Functioning IV was confirmed and monitors were applied. Sterile prep and drape, including hand hygiene, mask, and sterile gloves were used. The patient was positioned and the spine was prepped. The skin was anesthetized with lidocaine. Free flow of clear CSF was obtained prior to injecting local anesthetic into the CSF. The spinal needle aspirated freely following injection. The needle was carefully withdrawn. The patient tolerated the procedure well.   Leslye Peer, MD

## 2023-02-14 NOTE — Progress Notes (Signed)
Orthopedic Tech Progress Note Patient Details:  Dicy Smigel Jun 03, 1956 [id number] CPM will be removed at 4:40 pm.  CPM Right Knee CPM Right Knee: On Right Knee Flexion (Degrees): 40 Right Knee Extension (Degrees): 10  Post Interventions Patient Tolerated: Well  Genelle Bal Marvella Jenning 02/14/2023, 12:45 PM

## 2023-02-14 NOTE — Evaluation (Signed)
Physical Therapy Evaluation Patient Details Name: Tracy Mckenzie MRN: [id number] DOB: 08-Jan-1956 Today's Date: 02/14/2023  History of Present Illness  67 yo female presents to therapy s/p R TKA on 02/14/2023 due to failure of conservative measures. Pt PMH includes but is not limited to: RBBB, CAD, back pain and sciatica, tib/fib fx (2022), hiatal hernia, bleeding ulcer, kidney and liver cysts, and hardware removal R LE (2002 and 2003).  Clinical Impression   Alycia Cooperwood is a 67 y.o. female POD 0 s/p R TKA. Patient reports mod I with mobility at baseline. Patient is now limited by functional impairments (see PT problem list below) and requires min guard for bed mobility and min guard and cues for transfers. Patient was able to ambulate 30 feet with RW and min guard level of assist pt reported feeling a little light headed with gait tasks. Patient instructed in exercise to facilitate ROM and circulation to manage edema. Patient will benefit from continued skilled PT interventions to address impairments and progress towards PLOF. Acute PT will follow to progress mobility and stair training in preparation for safe discharge home with family support and OPPT services.        Assistance Recommended at Discharge Intermittent Supervision/Assistance  If plan is discharge home, recommend the following:  Can travel by private vehicle  A little help with walking and/or transfers;A little help with bathing/dressing/bathroom;Assistance with cooking/housework;Assist for transportation;Help with stairs or ramp for entrance        Equipment Recommendations Rolling walker (2 wheels)  Recommendations for Other Services       Functional Status Assessment Patient has had a recent decline in their functional status and demonstrates the ability to make significant improvements in function in a reasonable and predictable amount of time.     Precautions / Restrictions Precautions Precautions:  Knee;Fall Restrictions Weight Bearing Restrictions: No      Mobility  Bed Mobility Overal bed mobility: Needs Assistance Bed Mobility: Supine to Sit     Supine to sit: Min guard     General bed mobility comments: HOB elevated and cues    Transfers Overall transfer level: Needs assistance Equipment used: Rolling walker (2 wheels) Transfers: Sit to/from Stand Sit to Stand: Min guard, From elevated surface           General transfer comment: cues for proper UE placement    Ambulation/Gait Ambulation/Gait assistance: Min guard Gait Distance (Feet): 30 Feet Assistive device: Rolling walker (2 wheels) Gait Pattern/deviations: Step-to pattern, Antalgic, Trunk flexed Gait velocity: decreased     General Gait Details: pt electing to place LLE in ankle PF to clear R LE with swing and cues for posture, proper use of RW  Stairs            Wheelchair Mobility     Tilt Bed    Modified Rankin (Stroke Patients Only)       Balance Overall balance assessment: Needs assistance Sitting-balance support: Feet supported Sitting balance-Leahy Scale: Good     Standing balance support: Bilateral upper extremity supported, During functional activity, Reliant on assistive device for balance Standing balance-Leahy Scale: Poor                               Pertinent Vitals/Pain Pain Assessment Pain Assessment: 0-10 Pain Score: 4  Pain Location: R knee Pain Descriptors / Indicators: Aching, Discomfort, Constant, Operative site guarding Pain Intervention(s): Limited activity within patient's tolerance, Monitored during session, Premedicated before  session, Repositioned, Ice applied    Home Living Family/patient expects to be discharged to:: Private residence Living Arrangements: Spouse/significant other Available Help at Discharge: Family Type of Home: House Home Access: Stairs to enter Entrance Stairs-Rails: None Entrance Stairs-Number of Steps: 2    Home Layout: One level Home Equipment: Crutches;Cane - single point;Cane - quad Additional Comments: grab bar attached to the side of the tub    Prior Function Prior Level of Function : Independent/Modified Independent             Mobility Comments: Mod I with SPC for household and community navigation, mod I for ADLs, self care tasks and IADLs       Hand Dominance        Extremity/Trunk Assessment        Lower Extremity Assessment Lower Extremity Assessment: RLE deficits/detail RLE Deficits / Details: ankle DF/PF 5/5; SLR < 10 degree lag RLE Sensation: decreased proprioception;history of peripheral neuropathy;decreased light touch    Cervical / Trunk Assessment Cervical / Trunk Assessment:  (wfl)  Communication   Communication: No difficulties  Cognition Arousal/Alertness: Awake/alert Behavior During Therapy: WFL for tasks assessed/performed Overall Cognitive Status: Within Functional Limits for tasks assessed                                          General Comments      Exercises     Assessment/Plan    PT Assessment Patient needs continued PT services  PT Problem List Decreased strength;Decreased range of motion;Decreased activity tolerance;Decreased balance;Decreased mobility;Decreased coordination;Pain       PT Treatment Interventions DME instruction;Gait training;Stair training;Functional mobility training;Therapeutic activities;Therapeutic exercise;Balance training;Patient/family education;Modalities    PT Goals (Current goals can be found in the Care Plan section)  Acute Rehab PT Goals Patient Stated Goal: walk in the neighborhood, return to active life style and playing with grandchildren. PT Goal Formulation: With patient Time For Goal Achievement: 02/28/23 Potential to Achieve Goals: Good    Frequency 7X/week     Co-evaluation               AM-PAC PT "6 Clicks" Mobility  Outcome Measure Help needed turning from  your back to your side while in a flat bed without using bedrails?: A Little Help needed moving from lying on your back to sitting on the side of a flat bed without using bedrails?: A Little Help needed moving to and from a bed to a chair (including a wheelchair)?: A Little Help needed standing up from a chair using your arms (e.g., wheelchair or bedside chair)?: A Little Help needed to walk in hospital room?: A Little Help needed climbing 3-5 steps with a railing? : A Lot 6 Click Score: 17    End of Session Equipment Utilized During Treatment: Gait belt       PT Visit Diagnosis: Unsteadiness on feet (R26.81);Other abnormalities of gait and mobility (R26.89);Muscle weakness (generalized) (M62.81);Pain;Difficulty in walking, not elsewhere classified (R26.2) Pain - Right/Left: Right Pain - part of body: Knee;Leg    Time: 1632-1710 PT Time Calculation (min) (ACUTE ONLY): 38 min   Charges:   PT Evaluation $PT Eval Low Complexity: 1 Low PT Treatments $Gait Training: 8-22 mins $Therapeutic Activity: 8-22 mins PT General Charges $$ ACUTE PT VISIT: 1 Visit         Johnny Bridge, PT Acute Rehab   Jacqualyn Posey 02/14/2023, 5:53 PM

## 2023-02-14 NOTE — Interval H&P Note (Signed)
History and Physical Interval Note:  02/14/2023 8:39 AM  Tracy Mckenzie  has presented today for surgery, with the diagnosis of right knee osteoarthritis.  The various methods of treatment have been discussed with the patient and family. After consideration of risks, benefits and other options for treatment, the patient has consented to  Procedure(s): TOTAL KNEE ARTHROPLASTY (Right) as a surgical intervention.  The patient's history has been reviewed, patient examined, no change in status, stable for surgery.  I have reviewed the patient's chart and labs.  Questions were answered to the patient's satisfaction.     Homero Fellers Tracy Mckenzie

## 2023-02-14 NOTE — Discharge Instructions (Addendum)
Ollen Gross, MD Total Joint Specialist [facility] [address] [place], [place] [address] [phone]  TOTAL KNEE REPLACEMENT POSTOPERATIVE DIRECTIONS    Knee Rehabilitation, Guidelines Following Surgery  Results after knee surgery are often greatly improved when you follow the exercise, range of motion and muscle strengthening exercises prescribed by your doctor. Safety measures are also important to protect the knee from further injury. If any of these exercises cause you to have increased pain or swelling in your knee joint, decrease the amount until you are comfortable again and slowly increase them. If you have problems or questions, call your caregiver or physical therapist for advice.   BLOOD CLOT PREVENTION Take a 10 mg Xarelto once a day for three weeks following surgery.  You may resume your vitamins/supplements once you have discontinued the Xarelto. Do not take any NSAIDs (Advil, Aleve, Ibuprofen, Meloxicam, etc.) while taking Xarelto.    Information on my medicine - XARELTO (Rivaroxaban)  Why was Xarelto prescribed for you? Xarelto was prescribed for you to reduce the risk of blood clots forming after orthopedic surgery. The medical term for these abnormal blood clots is venous thromboembolism (VTE).  What do you need to know about xarelto ? Take your Xarelto ONCE DAILY at the same time every day. You may take it either with or without food.  If you have difficulty swallowing the tablet whole, you may crush it and mix in applesauce just prior to taking your dose.  Take Xarelto exactly as prescribed by your doctor and DO NOT stop taking Xarelto without talking to the doctor who prescribed the medication.  Stopping without other VTE prevention medication to take the place of Xarelto may increase your risk of developing a clot.  After discharge, you should have regular check-up appointments with your healthcare provider that is  prescribing your Xarelto.    What do you do if you miss a dose? If you miss a dose, take it as soon as you remember on the same day then continue your regularly scheduled once daily regimen the next day. Do not take two doses of Xarelto on the same day.   Important Safety Information A possible side effect of Xarelto is bleeding. You should call your healthcare provider right away if you experience any of the following: Bleeding from an injury or your nose that does not stop. Unusual colored urine (red or dark brown) or unusual colored stools (red or black). Unusual bruising for unknown reasons. A serious fall or if you hit your head (even if there is no bleeding).  Some medicines may interact with Xarelto and might increase your risk of bleeding while on Xarelto. To help avoid this, consult your healthcare provider or pharmacist prior to using any new prescription or non-prescription medications, including herbals, vitamins, non-steroidal anti-inflammatory drugs (NSAIDs) and supplements.  This website has more information on Xarelto: VisitDestination.com.br.    HOME CARE INSTRUCTIONS  Remove items at home which could result in a fall. This includes throw rugs or furniture in walking pathways.  ICE to the affected knee as much as tolerated. Icing helps control swelling. If the swelling is well controlled you will be more comfortable and rehab easier. Continue to use ice on the knee for pain and swelling from surgery. You may notice swelling that will progress down to the foot and ankle. This is normal after surgery. Elevate the leg when you are not up walking on it.    Continue to use the breathing  machine which will help keep your temperature down. It is common for your temperature to cycle up and down following surgery, especially at night when you are not up moving around and exerting yourself. The breathing machine keeps your lungs expanded and your temperature down. Do not place pillow under  the operative knee, focus on keeping the knee straight while resting  DIET You may resume your previous home diet once you are discharged from the hospital.  DRESSING / WOUND CARE / SHOWERING Keep your bulky bandage on for 2 days. On the third post-operative day you may remove the Ace bandage and gauze. There is a waterproof adhesive bandage on your skin which will stay in place until your first follow-up appointment. Once you remove this you will not need to place another bandage You may begin showering 3 days following surgery, but do not submerge the incision under water.  ACTIVITY For the first 5 days, the key is rest and control of pain and swelling Do your home exercises twice a day starting on post-operative day 3. On the days you go to physical therapy, just do the home exercises once that day. You should rest, ice and elevate the leg for 50 minutes out of every hour. Get up and walk/stretch for 10 minutes per hour. After 5 days you can increase your activity slowly as tolerated. Walk with your walker as instructed. Use the walker until you are comfortable transitioning to a cane. Walk with the cane in the opposite hand of the operative leg. You may discontinue the cane once you are comfortable and walking steadily. Avoid periods of inactivity such as sitting longer than an hour when not asleep. This helps prevent blood clots.  You may discontinue the knee immobilizer once you are able to perform a straight leg raise while lying down. You may resume a sexual relationship in one month or when given the OK by your doctor.  You may return to work once you are cleared by your doctor.  Do not drive a car for 6 weeks or until released by your surgeon.  Do not drive while taking narcotics.  TED HOSE STOCKINGS Wear the elastic stockings on both legs for three weeks following surgery during the day. You may remove them at night for sleeping.  WEIGHT BEARING Weight bearing as tolerated with  assist device (walker, cane, etc) as directed, use it as long as suggested by your surgeon or therapist, typically at least 4-6 weeks.  POSTOPERATIVE CONSTIPATION PROTOCOL Constipation - defined medically as fewer than three stools per week and severe constipation as less than one stool per week.  One of the most common issues patients have following surgery is constipation.  Even if you have a regular bowel pattern at home, your normal regimen is likely to be disrupted due to multiple reasons following surgery.  Combination of anesthesia, postoperative narcotics, change in appetite and fluid intake all can affect your bowels.  In order to avoid complications following surgery, here are some recommendations in order to help you during your recovery period.  Colace (docusate) - Pick up an over-the-counter form of Colace or another stool softener and take twice a day as long as you are requiring postoperative pain medications.  Take with a full glass of water daily.  If you experience loose stools or diarrhea, hold the colace until you stool forms back up. If your symptoms do not get better within 1 week or if they get worse, check with your doctor. Dulcolax (  bisacodyl) - Pick up over-the-counter and take as directed by the product packaging as needed to assist with the movement of your bowels.  Take with a full glass of water.  Use this product as needed if not relieved by Colace only.  MiraLax (polyethylene glycol) - Pick up over-the-counter to have on hand. MiraLax is a solution that will increase the amount of water in your bowels to assist with bowel movements.  Take as directed and can mix with a glass of water, juice, soda, coffee, or tea. Take if you go more than two days without a movement. Do not use MiraLax more than once per day. Call your doctor if you are still constipated or irregular after using this medication for 7 days in a row.  If you continue to have problems with postoperative  constipation, please contact the office for further assistance and recommendations.  If you experience "the worst abdominal pain ever" or develop nausea or vomiting, please contact the office immediatly for further recommendations for treatment.  ITCHING If you experience itching with your medications, try taking only a single pain pill, or even half a pain pill at a time.  You can also use Benadryl over the counter for itching or also to help with sleep.   MEDICATIONS See your medication summary on the "After Visit Summary" that the nursing staff will review with you prior to discharge.  You may have some home medications which will be placed on hold until you complete the course of blood thinner medication.  It is important for you to complete the blood thinner medication as prescribed by your surgeon.  Continue your approved medications as instructed at time of discharge.  PRECAUTIONS If you experience chest pain or shortness of breath - call 911 immediately for transfer to the hospital emergency department.  If you develop a fever greater that 101 F, purulent drainage from wound, increased redness or drainage from wound, foul odor from the wound/dressing, or calf pain - CONTACT YOUR SURGEON.                                                   FOLLOW-UP APPOINTMENTS Make sure you keep all of your appointments after your operation with your surgeon and caregivers. You should call the office at the above phone number and make an appointment for approximately two weeks after the date of your surgery or on the date instructed by your surgeon outlined in the "After Visit Summary".  RANGE OF MOTION AND STRENGTHENING EXERCISES  Rehabilitation of the knee is important following a knee injury or an operation. After just a few days of immobilization, the muscles of the thigh which control the knee become weakened and shrink (atrophy). Knee exercises are designed to build up the tone and strength of the thigh  muscles and to improve knee motion. Often times heat used for twenty to thirty minutes before working out will loosen up your tissues and help with improving the range of motion but do not use heat for the first two weeks following surgery. These exercises can be done on a training (exercise) mat, on the floor, on a table or on a bed. Use what ever works the best and is most comfortable for you Knee exercises include:  Leg Lifts - While your knee is still immobilized in a splint or cast,  you can do straight leg raises. Lift the leg to 60 degrees, hold for 3 sec, and slowly lower the leg. Repeat 10-20 times 2-3 times daily. Perform this exercise against resistance later as your knee gets better.  Quad and Hamstring Sets - Tighten up the muscle on the front of the thigh (Quad) and hold for 5-10 sec. Repeat this 10-20 times hourly. Hamstring sets are done by pushing the foot backward against an object and holding for 5-10 sec. Repeat as with quad sets.  Leg Slides: Lying on your back, slowly slide your foot toward your buttocks, bending your knee up off the floor (only go as far as is comfortable). Then slowly slide your foot back down until your leg is flat on the floor again. Angel Wings: Lying on your back spread your legs to the side as far apart as you can without causing discomfort.  A rehabilitation program following serious knee injuries can speed recovery and prevent re-injury in the future due to weakened muscles. Contact your doctor or a physical therapist for more information on knee rehabilitation.   POST-OPERATIVE OPIOID TAPER INSTRUCTIONS: It is important to wean off of your opioid medication as soon as possible. If you do not need pain medication after your surgery it is ok to stop day one. Opioids include: Codeine, Hydrocodone(Norco, Vicodin), Oxycodone(Percocet, oxycontin) and hydromorphone amongst others.  Long term and even short term use of opiods can cause: Increased pain  response Dependence Constipation Depression Respiratory depression And more.  Withdrawal symptoms can include Flu like symptoms Nausea, vomiting And more Techniques to manage these symptoms Hydrate well Eat regular healthy meals Stay active Use relaxation techniques(deep breathing, meditating, yoga) Do Not substitute Alcohol to help with tapering If you have been on opioids for less than two weeks and do not have pain than it is ok to stop all together.  Plan to wean off of opioids This plan should start within one week post op of your joint replacement. Maintain the same interval or time between taking each dose and first decrease the dose.  Cut the total daily intake of opioids by one tablet each day Next start to increase the time between doses. The last dose that should be eliminated is the evening dose.   IF YOU ARE TRANSFERRED TO A SKILLED REHAB FACILITY If the patient is transferred to a skilled rehab facility following release from the hospital, a list of the current medications will be sent to the facility for the patient to continue.  When discharged from the skilled rehab facility, please have the facility set up the patient's Home Health Physical Therapy prior to being released. Also, the skilled facility will be responsible for providing the patient with their medications at time of release from the facility to include their pain medication, the muscle relaxants, and their blood thinner medication. If the patient is still at the rehab facility at time of the two week follow up appointment, the skilled rehab facility will also need to assist the patient in arranging follow up appointment in our office and any transportation needs.  MAKE SURE YOU:  Understand these instructions.  Get help right away if you are not doing well or get worse.   DENTAL ANTIBIOTICS:  In most cases prophylactic antibiotics for Dental procdeures after total joint surgery are not  necessary.  Exceptions are as follows:  1. History of prior total joint infection  2. Severely immunocompromised (Organ Transplant, cancer chemotherapy, Rheumatoid biologic meds such as [place])  3. Poorly controlled diabetes (A1C &gt; 8.0, blood glucose over 200)  If you have one of these conditions, contact your surgeon for an antibiotic prescription, prior to your dental procedure.    Pick up stool softner and laxative for home use following surgery while on pain medications. Do not submerge incision under water. Please use good hand washing techniques while changing dressing each day. May shower starting three days after surgery. Please use a clean towel to pat the incision dry following showers. Continue to use ice for pain and swelling after surgery. Do not use any lotions or creams on the incision until instructed by your surgeon.

## 2023-02-15 ENCOUNTER — Encounter (HOSPITAL_COMMUNITY): Payer: Self-pay | Admitting: Orthopedic Surgery

## 2023-02-15 DIAGNOSIS — I2510 Atherosclerotic heart disease of native coronary artery without angina pectoris: Secondary | ICD-10-CM | POA: Diagnosis not present

## 2023-02-15 DIAGNOSIS — Z79899 Other long term (current) drug therapy: Secondary | ICD-10-CM | POA: Diagnosis not present

## 2023-02-15 DIAGNOSIS — M1711 Unilateral primary osteoarthritis, right knee: Secondary | ICD-10-CM | POA: Diagnosis not present

## 2023-02-15 DIAGNOSIS — I10 Essential (primary) hypertension: Secondary | ICD-10-CM | POA: Diagnosis not present

## 2023-02-15 LAB — BASIC METABOLIC PANEL
Anion gap: 10 (ref 5–15)
BUN: 13 mg/dL (ref 8–23)
CO2: 21 mmol/L — ABNORMAL LOW (ref 22–32)
Calcium: 8.4 mg/dL — ABNORMAL LOW (ref 8.9–10.3)
Chloride: 104 mmol/L (ref 98–111)
Creatinine, Ser: 0.88 mg/dL (ref 0.44–1.00)
GFR, Estimated: >60 mL/min (ref >60)
Glucose, Bld: 160 mg/dL — ABNORMAL HIGH (ref 70–99)
Potassium: 3.2 mmol/L — ABNORMAL LOW (ref 3.5–5.1)
Sodium: 135 mmol/L (ref 135–145)

## 2023-02-15 LAB — CBC
HCT: 34.6 % — ABNORMAL LOW (ref 36.0–46.0)
Hemoglobin: 11.5 g/dL — ABNORMAL LOW (ref 12.0–15.0)
MCH: 30.1 pg (ref 26.0–34.0)
MCHC: 33.2 g/dL (ref 30.0–36.0)
MCV: 90.6 fL (ref 80.0–100.0)
Platelets: 315 10*3/uL (ref 150–400)
RBC: 3.82 MIL/uL — ABNORMAL LOW (ref 3.87–5.11)
RDW: 12.5 % (ref 11.5–15.5)
WBC: 19.8 10*3/uL — ABNORMAL HIGH (ref 4.0–10.5)
nRBC: 0 % (ref 0.0–0.2)

## 2023-02-15 MED ORDER — HYDROMORPHONE HCL 2 MG PO TABS: 1.0000 mg | ORAL_TABLET | Freq: Four times a day (QID) | ORAL | 0 refills | Status: DC | PRN

## 2023-02-15 MED ORDER — TRAMADOL HCL 50 MG PO TABS: 50.0000 mg | ORAL_TABLET | Freq: Four times a day (QID) | ORAL | 0 refills | Status: DC | PRN

## 2023-02-15 MED ORDER — POTASSIUM CHLORIDE CRYS ER 20 MEQ PO TBCR
40.0000 meq | EXTENDED_RELEASE_TABLET | Freq: Once | ORAL | Status: AC
  Administered 2023-02-15: 40 meq via ORAL
  Filled 2023-02-15: qty 2

## 2023-02-15 MED ORDER — RIVAROXABAN 10 MG PO TABS: 10.0000 mg | ORAL_TABLET | Freq: Every day | ORAL | 0 refills | Status: AC

## 2023-02-15 MED ORDER — METHOCARBAMOL 500 MG PO TABS: 500.0000 mg | ORAL_TABLET | Freq: Four times a day (QID) | ORAL | 0 refills | Status: DC | PRN

## 2023-02-15 MED ORDER — ONDANSETRON HCL 4 MG PO TABS: 4.0000 mg | ORAL_TABLET | Freq: Four times a day (QID) | ORAL | 0 refills | Status: DC | PRN

## 2023-02-15 NOTE — Progress Notes (Signed)
Subjective: 1 Day Post-Op Procedure(s) (LRB): TOTAL KNEE ARTHROPLASTY (Right) Patient reports pain as mild.   Patient seen in rounds by Dr. Lequita Halt. Patient is feeling well this morning. Had issues with voiding last night, required I/O cath but has voided since. Denies any abdominal discomfort. Pain controlled. We will continue therapy today, ambulated 30' yesterday.   Objective: Vital signs in last 24 hours: Temp:  [96.2 F (35.7 C)-98.2 F (36.8 C)] 97.7 F (36.5 C) (07/16 0458) Pulse Rate:  [63-92] 81 (07/16 0458) Resp:  [10-24] 17 (07/16 0458) BP: (108-152)/(53-128) 120/69 (07/16 0458) SpO2:  [93 %-100 %] 95 % (07/16 0458) Weight:  [81.8 kg] 81.8 kg (07/15 1637)  Intake/Output from previous day:  Intake/Output Summary (Last 24 hours) at 02/15/2023 0823 Last data filed at 02/15/2023 2130 Gross per 24 hour  Intake 2708.52 ml  Output 1825 ml  Net 883.52 ml     Intake/Output this shift: No intake/output data recorded.  Labs: Recent Labs    02/15/23 0311  HGB 11.5*   Recent Labs    02/15/23 0311  WBC 19.8*  RBC 3.82*  HCT 34.6*  PLT 315   Recent Labs    02/15/23 0311  NA 135  K 3.2*  CL 104  CO2 21*  BUN 13  CREATININE 0.88  GLUCOSE 160*  CALCIUM 8.4*   No results for input(s): "LABPT", "INR" in the last 72 hours.  Exam: General - Patient is Alert and Oriented Extremity - Neurologically intact Neurovascular intact Sensation intact distally Dorsiflexion/Plantar flexion intact Dressing - dressing C/D/I Motor Function - intact, moving foot and toes well on exam.   Past Medical History:  Diagnosis Date   Anemia    Arthritis    on meds   Bleeding ulcer    Cataract    recently dx-bilaterally   Dyspnea    sob realted to allergies and cough   Elevated cholesterol    on meds   GERD (gastroesophageal reflux disease)    on meds   Headache    Hiatal hernia    High blood pressure    on meds   Kidney cysts    Leukocytosis    idiopathic    Liver cyst    Nerve pain    L sciatic pain radiating to R   Osteoporosis    reaction to Prolia=   Peptic ulcer disease    PONV (postoperative nausea and vomiting)    Seasonal allergies     Assessment/Plan: 1 Day Post-Op Procedure(s) (LRB): TOTAL KNEE ARTHROPLASTY (Right) Principal Problem:   OA (osteoarthritis) of knee Active Problems:   Primary osteoarthritis of right knee  Estimated body mass index is 29.12 kg/m as calculated from the following:   Height as of this encounter: 5\' 6"  (1.676 m).   Weight as of this encounter: 81.8 kg. Advance diet Up with therapy D/C IV fluids   Patient's anticipated LOS is less than 2 midnights, meeting these requirements: - Lives within 1 hour of care - Has a competent adult at home to recover with post-op recover - NO history of  - Chronic pain requiring opioids  - Diabetes  - Coronary Artery Disease  - Heart failure  - Heart attack  - Stroke  - DVT/VTE  - Cardiac arrhythmia  - Respiratory Failure/COPD  - Renal failure  - Anemia  - Advanced Liver disease  DVT Prophylaxis - Xarelto Weight bearing as tolerated. Continue therapy.  Potassium 3.2 this AM, two doses of Kcl ordered.  Plan  is to go Home after hospital stay. Plan for discharge later today if progresses with therapy and voiding without issue. Scheduled [facility] OPP[facility] at [facility]. Follow-up in the office in 2 weeks.  The PDMP database was reviewed today prior to any opioid medications being prescribed to this patient.  Arther Abbott, PA-C Orthopedic Surgery [phone] 02/15/2023, 8:23 AM

## 2023-02-15 NOTE — Progress Notes (Signed)
Physical Therapy Treatment Patient Details Name: Tracy Mckenzie MRN: [id number] DOB: [birth year] Today's Date: 02/15/2023   History of Present Illness 67 yo female presents to therapy s/p R TKA on 02/14/2023 due to failure of conservative measures. Pt PMH includes but is not limited to: RBBB, CAD, back pain and sciatica, tib/fib fx (2022), hiatal hernia, bleeding ulcer, kidney and liver cysts, and hardware removal R LE (2002 and 2003).    PT Comments   Tracy Mckenzie is a 67 y.o. female POD 1 s/p R TKA, PM tx session. Patient reports mod I with mobility at baseline. Patient is now limited by functional impairments (see PT problem list below) and mod I for bed mobility and S and cues for transfers. Patient was able to ambulate 125 feet with RW and S level of assist pt reported feeling a little light headed with gait tasks. Patient instructed in exercise to facilitate ROM and circulation to manage edema seated EOB and HO provided and no further questions per HEP. PT provided pt and family instruction on safe step navigation with HHA and SPC with min guard and cues. Duscssion per PT to defer back stretching and decompression apparatus to OPPT and particpated with tub transfer problem solving to increase safety and I with ADLs. Patient will benefit from continued PT in OP setting with pt reporting starting 7/19.  Patient has met mobility goals at adequate level for discharge home with family support; will continue to follow if pt continues acute stay to progress towards Mod I goals.    Assistance Recommended at Discharge Intermittent Supervision/Assistance  If plan is discharge home, recommend the following:  Can travel by private vehicle    A little help with walking and/or transfers;A little help with bathing/dressing/bathroom;Assistance with cooking/housework;Assist for transportation;Help with stairs or ramp for entrance      Equipment Recommendations  Rolling walker (2 wheels)    Recommendations  for Other Services       Precautions / Restrictions Precautions Precautions: Knee;Fall Restrictions Weight Bearing Restrictions: No     Mobility  Bed Mobility Overal bed mobility: Needs Assistance, Modified Independent Bed Mobility: Supine to Sit     Supine to sit: Supervision Sit to supine: Supervision   General bed mobility comments: HOB elevated    Transfers Overall transfer level: Needs assistance Equipment used: Rolling walker (2 wheels) Transfers: Sit to/from Stand Sit to Stand: Supervision (bed and elevated commode seat)           General transfer comment: cues for proper UE placement    Ambulation/Gait Ambulation/Gait assistance: Supervision Gait Distance (Feet): 125 Feet Assistive device: Rolling walker (2 wheels) Gait Pattern/deviations: Step-to pattern, Antalgic, Trunk flexed Gait velocity: decreased     General Gait Details: cues for safety with turns   Stairs Stairs: Yes Stairs assistance: Min guard Stair Management: With cane Number of Stairs: 3 General stair comments: SPC and HHA with cues for proper sequencing and assist as well as placing RW at top of steps, Spouse is aware   Wheelchair Mobility     Tilt Bed    Modified Rankin (Stroke Patients Only)       Balance Overall balance assessment: Needs assistance Sitting-balance support: Feet supported Sitting balance-Leahy Scale: Good     Standing balance support: Bilateral upper extremity supported, During functional activity, Reliant on assistive device for balance Standing balance-Leahy Scale: Fair Standing balance comment: static standing no UE support  Cognition Arousal/Alertness: Awake/alert Behavior During Therapy: WFL for tasks assessed/performed Overall Cognitive Status: Within Functional Limits for tasks assessed                                          Exercises Total Joint Exercises Ankle Circles/Pumps:  AROM, Both, 20 reps Quad Sets: AROM, Right, 5 reps Short Arc Quad: AROM, Right, 5 reps Heel Slides: AROM, Right, 5 reps Hip ABduction/ADduction: AROM, Right, 5 reps, Supine Straight Leg Raises: AROM, Right, 5 reps Knee Flexion: AROM, Right, 5 reps, Seated    General Comments        Pertinent Vitals/Pain Pain Assessment Pain Assessment: 0-10 Pain Score: 3  Pain Location: R knee Pain Descriptors / Indicators: Aching, Discomfort, Constant, Operative site guarding Pain Intervention(s): Limited activity within patient's tolerance, Monitored during session, Premedicated before session, Repositioned, Ice applied    Home Living                          Prior Function            PT Goals (current goals can now be found in the care plan section) Acute Rehab PT Goals Patient Stated Goal: walk in the neighborhood, return to active life style and playing with grandchildren. PT Goal Formulation: With patient Time For Goal Achievement: 02/28/23 Potential to Achieve Goals: Good Progress towards PT goals: Progressing toward goals    Frequency    7X/week      PT Plan Current plan remains appropriate    Co-evaluation              AM-PAC PT "6 Clicks" Mobility   Outcome Measure  Help needed turning from your back to your side while in a flat bed without using bedrails?: None Help needed moving from lying on your back to sitting on the side of a flat bed without using bedrails?: None Help needed moving to and from a bed to a chair (including a wheelchair)?: None Help needed standing up from a chair using your arms (e.g., wheelchair or bedside chair)?: None Help needed to walk in hospital room?: A Little Help needed climbing 3-5 steps with a railing? : A Little 6 Click Score: 22    End of Session Equipment Utilized During Treatment: Gait belt Activity Tolerance: Patient tolerated treatment well Patient left: in bed;with bed alarm set;with family/visitor present  (seated EOB) Nurse Communication: Mobility status PT Visit Diagnosis: Unsteadiness on feet (R26.81);Other abnormalities of gait and mobility (R26.89);Muscle weakness (generalized) (M62.81);Pain;Difficulty in walking, not elsewhere classified (R26.2) Pain - Right/Left: Right Pain - part of body: Knee;Leg     Time: 1610-9604 PT Time Calculation (min) (ACUTE ONLY): 29 min  Charges:    $Gait Training: 8-22 mins $Therapeutic Activity: 8-22 mins PT General Charges $$ ACUTE PT VISIT: 1 Visit                     Tracy Mckenzie, PT Acute Rehab    Jacqualyn Posey 02/15/2023, 4:16 PM

## 2023-02-15 NOTE — Care Management Obs Status (Signed)
MEDICARE OBSERVATION STATUS NOTIFICATION   Patient Details  Name: Tracy Mckenzie MRN: [id number] Date of Birth: July 13, 1956   Medicare Observation Status Notification Given:  Yes    Amada Jupiter, LCSW 02/15/2023, 11:05 AM

## 2023-02-15 NOTE — Progress Notes (Signed)
Pt reports feeling of fullness in the abdomen and unable to void despite a strong urge. Pt was transferred to [facility] couple of times with only 150 mL output. Pt was bladder scanned and had 601 mL. Paged the on call PA and order received for In & out cath. Pt had 500 ml of urine drain from the cath. We will continue to monitor the pt.

## 2023-02-15 NOTE — TOC Transition Note (Signed)
Transition of Care [facility]) - CM/SW Discharge Note   Patient Details  Name: Tracy Mckenzie MRN: [id number] Date of Birth: [birth year]  [facility]) CM/SW Contact:  Amada Jupiter, LCSW Phone Number: 02/15/2023, 9:42 AM   Clinical Narrative:     Met with pt and confirming she has received RW to room via [employer].  OPPT already arranged with [facility]. No further TOC needs.  Final next level of care: OP Rehab Barriers to Discharge: No Barriers Identified   Patient Goals and CMS Choice      Discharge Placement                         Discharge Plan and Services Additional resources added to the After Visit Summary for                  DME Arranged: Walker rolling DME Agency: [employer]                  Social Determinants of Health (SDOH) Interventions SDOH Screenings   Food Insecurity: No Food Insecurity (02/14/2023)  Housing: Low Risk  (02/14/2023)  Transportation Needs: No Transportation Needs (02/14/2023)  Utilities: Not At Risk (02/14/2023)  Depression (PHQ2-9): Low Risk  (11/02/2022)  Financial Resource Strain: Low Risk  (11/02/2022)  Physical Activity: Inactive (11/02/2022)  Social Connections: Moderately Integrated (11/02/2022)  Stress: Stress Concern Present (11/02/2022)  Tobacco Use: Low Risk  (02/14/2023)     Readmission Risk Interventions     No data to display

## 2023-02-15 NOTE — Progress Notes (Signed)
 Patient discharged to home w/ family. Given all belongings, instructions, equipment. Verbalized understanding of instructions. Escorted to pov via w/c. 

## 2023-02-15 NOTE — Plan of Care (Signed)
°  Problem: Education: °Goal: Knowledge of the prescribed therapeutic regimen will improve °Outcome: Progressing °  °Problem: Activity: °Goal: Range of joint motion will improve °Outcome: Progressing °  °Problem: Clinical Measurements: °Goal: Postoperative complications will be avoided or minimized °Outcome: Progressing °  °Problem: Pain Management: °Goal: Pain level will decrease with appropriate interventions °Outcome: Progressing °  °Problem: Safety: °Goal: Ability to remain free from injury will improve °Outcome: Progressing °  °

## 2023-02-15 NOTE — Progress Notes (Signed)
Physical Therapy Treatment Patient Details Name: Tracy Mckenzie MRN: [id number] DOB: 12-11-1955 Today's Date: 02/15/2023   History of Present Illness 67 yo female presents to therapy s/p R TKA on 02/14/2023 due to failure of conservative measures. Pt PMH includes but is not limited to: RBBB, CAD, back pain and sciatica, tib/fib fx (2022), hiatal hernia, bleeding ulcer, kidney and liver cysts, and hardware removal R LE (2002 and 2003).    PT Comments   Tracy Mckenzie is a 67 y.o. female POD 1 s/p R TKA. Patient reports mod I with mobility at baseline. Patient is now limited by functional impairments (see PT problem list below) and requires S for bed mobility and S and cues for transfers. Patient was able to ambulate 125 feet with RW and S level of assist pt reported feeling a little light headed with gait tasks. Patient instructed in exercise to facilitate ROM and circulation to manage edema in supine. Patient will benefit from continued skilled PT interventions to address impairments and progress towards PLOF. Acute PT will follow to progress mobility and stair training in preparation for safe discharge home with family support and OPPT services.     Assistance Recommended at Discharge Intermittent Supervision/Assistance  If plan is discharge home, recommend the following:  Can travel by private vehicle    A little help with walking and/or transfers;A little help with bathing/dressing/bathroom;Assistance with cooking/housework;Assist for transportation;Help with stairs or ramp for entrance      Equipment Recommendations  Rolling walker (2 wheels)    Recommendations for Other Services       Precautions / Restrictions Precautions Precautions: Knee;Fall Restrictions Weight Bearing Restrictions: No     Mobility  Bed Mobility Overal bed mobility: Needs Assistance Bed Mobility: Supine to Sit, Sit to Supine     Supine to sit: Supervision Sit to supine: Supervision   General bed  mobility comments: HOB elevated and cues    Transfers Overall transfer level: Needs assistance Equipment used: Rolling walker (2 wheels) Transfers: Sit to/from Stand Sit to Stand: Supervision (bed and elevated commode seat)           General transfer comment: cues for proper UE placement    Ambulation/Gait Ambulation/Gait assistance: Supervision Gait Distance (Feet): 125 Feet Assistive device: Rolling walker (2 wheels) Gait Pattern/deviations: Step-to pattern, Antalgic, Trunk flexed Gait velocity: decreased     General Gait Details: cues for more nomalized gait pattern including R knee flexion during swing phase and occational cues for maintaining RW on floor   Stairs             Wheelchair Mobility     Tilt Bed    Modified Rankin (Stroke Patients Only)       Balance Overall balance assessment: Needs assistance Sitting-balance support: Feet supported Sitting balance-Leahy Scale: Good     Standing balance support: Bilateral upper extremity supported, During functional activity, Reliant on assistive device for balance Standing balance-Leahy Scale: Fair Standing balance comment: static standing no UE support                            Cognition Arousal/Alertness: Awake/alert Behavior During Therapy: WFL for tasks assessed/performed Overall Cognitive Status: Within Functional Limits for tasks assessed                                          Exercises Total  Joint Exercises Ankle Circles/Pumps: AROM, Both, 20 reps Quad Sets: AROM, Right, 5 reps Short Arc Quad: AROM, Right, 5 reps Heel Slides: AROM, Right, 5 reps Hip ABduction/ADduction: AROM, Right, 5 reps, Supine Straight Leg Raises: AROM, Right, 5 reps    General Comments        Pertinent Vitals/Pain Pain Assessment Pain Assessment: 0-10 Pain Score: 4  Pain Location: R knee Pain Descriptors / Indicators: Aching, Discomfort, Constant, Operative site  guarding Pain Intervention(s): Limited activity within patient's tolerance, Monitored during session, Premedicated before session, Repositioned, Ice applied    Home Living                          Prior Function            PT Goals (current goals can now be found in the care plan section) Acute Rehab PT Goals Patient Stated Goal: walk in the neighborhood, return to active life style and playing with grandchildren. PT Goal Formulation: With patient Time For Goal Achievement: 02/28/23 Potential to Achieve Goals: Good Progress towards PT goals: Progressing toward goals    Frequency    7X/week      PT Plan      Co-evaluation              AM-PAC PT "6 Clicks" Mobility   Outcome Measure  Help needed turning from your back to your side while in a flat bed without using bedrails?: None Help needed moving from lying on your back to sitting on the side of a flat bed without using bedrails?: None Help needed moving to and from a bed to a chair (including a wheelchair)?: A Little Help needed standing up from a chair using your arms (e.g., wheelchair or bedside chair)?: A Little Help needed to walk in hospital room?: A Little Help needed climbing 3-5 steps with a railing? : A Lot 6 Click Score: 19    End of Session Equipment Utilized During Treatment: Gait belt Activity Tolerance: Patient tolerated treatment well Patient left: in bed;with bed alarm set;with family/visitor present Nurse Communication: Mobility status PT Visit Diagnosis: Unsteadiness on feet (R26.81);Other abnormalities of gait and mobility (R26.89);Muscle weakness (generalized) (M62.81);Pain;Difficulty in walking, not elsewhere classified (R26.2) Pain - Right/Left: Right Pain - part of body: Knee;Leg     Time: 1610-9604 PT Time Calculation (min) (ACUTE ONLY): 24 min  Charges:    $Gait Training: 8-22 mins $Therapeutic Exercise: 8-22 mins PT General Charges $$ ACUTE PT VISIT: 1 Visit                      Johnny Bridge, PT Acute Rehab    Jacqualyn Posey 02/15/2023, 11:30 AM

## 2023-02-17 ENCOUNTER — Encounter: Payer: Self-pay | Admitting: Physician Assistant

## 2023-02-18 ENCOUNTER — Other Ambulatory Visit: Payer: Self-pay | Admitting: Physician Assistant

## 2023-02-18 DIAGNOSIS — M25661 Stiffness of right knee, not elsewhere classified: Secondary | ICD-10-CM | POA: Diagnosis not present

## 2023-02-18 DIAGNOSIS — M25561 Pain in right knee: Secondary | ICD-10-CM | POA: Diagnosis not present

## 2023-02-18 MED ORDER — GABAPENTIN 300 MG PO CAPS: 300.0000 mg | ORAL_CAPSULE | Freq: Two times a day (BID) | ORAL | 0 refills | Status: DC

## 2023-02-21 DIAGNOSIS — M25561 Pain in right knee: Secondary | ICD-10-CM | POA: Diagnosis not present

## 2023-02-21 DIAGNOSIS — M25661 Stiffness of right knee, not elsewhere classified: Secondary | ICD-10-CM | POA: Diagnosis not present

## 2023-02-21 NOTE — Discharge Summary (Signed)
Patient ID: Tracy Mckenzie MRN: [id number] DOB/AGE: 01-25-1956 67 y.o.  Admit date: 02/14/2023 Discharge date: 02/15/2023  Admission Diagnoses:  Principal Problem:   OA (osteoarthritis) of knee Active Problems:   Primary osteoarthritis of right knee   Discharge Diagnoses:  Same  Past Medical History:  Diagnosis Date   Anemia    Arthritis    on meds   Bleeding ulcer    Cataract    recently dx-bilaterally   Dyspnea    sob realted to allergies and cough   Elevated cholesterol    on meds   GERD (gastroesophageal reflux disease)    on meds   Headache    Hiatal hernia    High blood pressure    on meds   Kidney cysts    Leukocytosis    idiopathic   Liver cyst    Nerve pain    L sciatic pain radiating to R   Osteoporosis    reaction to Prolia=   Peptic ulcer disease    PONV (postoperative nausea and vomiting)    Seasonal allergies     Surgeries: Procedure(s): TOTAL KNEE ARTHROPLASTY on 02/14/2023   Consultants:   Discharged Condition: Improved  Hospital Course: Tracy Mckenzie is an 67 y.o. female who was admitted 02/14/2023 for operative treatment ofOA (osteoarthritis) of knee. Patient has severe unremitting pain that affects sleep, daily activities, and work/hobbies. After pre-op clearance the patient was taken to the operating room on 02/14/2023 and underwent  Procedure(s): TOTAL KNEE ARTHROPLASTY.    Patient was given perioperative antibiotics:  Anti-infectives (From admission, onward)    Start     Dose/Rate Route Frequency Ordered Stop   02/14/23 1700  ceFAZolin (ANCEF) IVPB 2g/100 mL premix        2 g 200 mL/hr over 30 Minutes Intravenous Every 6 hours 02/14/23 1439 02/15/23 0055   02/14/23 0815  ceFAZolin (ANCEF) IVPB 2g/100 mL premix        2 g 200 mL/hr over 30 Minutes Intravenous On call to O.R. 02/14/23 0813 02/14/23 1055        Patient was given sequential compression devices, early ambulation, and chemoprophylaxis to prevent DVT.  Patient  benefited maximally from hospital stay and there were no complications.    Recent vital signs: No data found.   Recent laboratory studies: No results for input(s): "WBC", "HGB", "HCT", "PLT", "NA", "K", "CL", "CO2", "BUN", "CREATININE", "GLUCOSE", "INR", "CALCIUM" in the last 72 hours.  Invalid input(s): "PT", "2"   Discharge Medications:   Allergies as of 02/15/2023       Reactions   Demerol Hcl [meperidine] Anaphylaxis, Palpitations   flushed   Ibuprofen Other (See Comments)   Stomach ulcers.   Nsaids Other (See Comments)   Peptic ulcers   Simvastatin     Muscle Pain   Tape Other (See Comments)   "Sometimes removes skin"   Percocet [oxycodone-acetaminophen] Itching, Rash   Prolia [denosumab] Rash   Vicodin [hydrocodone-acetaminophen] Itching        Medication List     STOP taking these medications    baclofen 10 MG tablet Commonly known as: LIORESAL   BONE DENSITY BUILDER PO   BORON PO   fluconazole 150 MG tablet Commonly known as: DIFLUCAN   OVER THE COUNTER MEDICATION   OVER THE COUNTER MEDICATION   RESVERATROL ULTRA PO   Vitamin D3 250 MCG (10000 UT) Tabs   zinc gluconate 50 MG tablet       TAKE these medications    acetaminophen  500 MG tablet Commonly known as: TYLENOL Take 1,000 mg by mouth every 6 (six) hours as needed.   albuterol 108 (90 Base) MCG/ACT inhaler Commonly known as: VENTOLIN HFA Inhale 2 puffs into the lungs every 6 (six) hours as needed for wheezing or shortness of breath.   azelastine 0.1 % nasal spray Commonly known as: ASTELIN Place 1 spray into both nostrils 2 (two) times daily as needed for rhinitis or allergies. What changed: when to take this   benzonatate 100 MG capsule Commonly known as: TESSALON TAKE 1 TO 2 CAPSULES BY MOUTH TWICE A DAY AS NEEDED FOR COUGH   gabapentin 100 MG capsule Commonly known as: Neurontin Take 1 capsule (100 mg total) by mouth at bedtime.   hydrochlorothiazide 12.5 MG  capsule Commonly known as: MICROZIDE Take 1 capsule (12.5 mg total) by mouth daily.   HYDROmorphone 2 MG tablet Commonly known as: DILAUDID Take 0.5-1 tablets (1-2 mg total) by mouth every 6 (six) hours as needed for severe pain.   hydrOXYzine 10 MG tablet Commonly known as: ATARAX Take 1 tablet (10 mg total) by mouth every 6 (six) hours. What changed: when to take this   loratadine 10 MG tablet Commonly known as: CLARITIN Take 10 mg by mouth 2 (two) times daily.   losartan 50 MG tablet Commonly known as: COZAAR TAKE 1 TABLET BY MOUTH TWICE A DAY FOR BLOOD PRESSURE   methocarbamol 500 MG tablet Commonly known as: ROBAXIN Take 1 tablet (500 mg total) by mouth every 6 (six) hours as needed for muscle spasms.   metoprolol tartrate 25 MG tablet Commonly known as: LOPRESSOR TAKE 1 TABLET BY MOUTH TWICE A DAY   montelukast 10 MG tablet Commonly known as: SINGULAIR TAKE ONE TABLET BY MOUTH AT BEDTIME   nitroGLYCERIN 0.4 MG SL tablet Commonly known as: NITROSTAT Place 1 tablet (0.4 mg total) under the tongue every 5 (five) minutes as needed for chest pain.   omeprazole 40 MG capsule Commonly known as: PRILOSEC TAKE 1 CAPSULE BY MOUTH DAILY 30 MINUTES BEFORE BREAKFAST   ondansetron 4 MG tablet Commonly known as: ZOFRAN Take 1 tablet (4 mg total) by mouth every 6 (six) hours as needed for nausea.   Pro Comfort Spacer Adult Misc 1 Device by Does not apply route as needed.   rivaroxaban 10 MG Tabs tablet Commonly known as: XARELTO Take 1 tablet (10 mg total) by mouth daily with breakfast for 20 days.   Systane Complete 0.6 % Soln Generic drug: Propylene Glycol Place 1 drop into both eyes as needed (dry eyes).   traMADol 50 MG tablet Commonly known as: ULTRAM Take 1-2 tablets (50-100 mg total) by mouth every 6 (six) hours as needed for moderate pain.               Discharge Care Instructions  (From admission, onward)           Start     Ordered    02/15/23 0000  Weight bearing as tolerated        02/15/23 0826   02/15/23 0000  Change dressing       Comments: You may remove the bulky bandage (ACE wrap and gauze) two days after surgery. You will have an adhesive waterproof bandage underneath. Leave this in place until your first follow-up appointment.   02/15/23 0826            Diagnostic Studies: No results found.  Disposition: Discharge disposition: 01-Home or Self Care  Discharge Instructions     Call MD / Call 911   Complete by: As directed    If you experience chest pain or shortness of breath, CALL 911 and be transported to the hospital emergency room.  If you develope a fever above 101 F, pus (white drainage) or increased drainage or redness at the wound, or calf pain, call your surgeon's office.   Change dressing   Complete by: As directed    You may remove the bulky bandage (ACE wrap and gauze) two days after surgery. You will have an adhesive waterproof bandage underneath. Leave this in place until your first follow-up appointment.   Constipation Prevention   Complete by: As directed    Drink plenty of fluids.  Prune juice may be helpful.  You may use a stool softener, such as Colace (over the counter) 100 mg twice a day.  Use MiraLax (over the counter) for constipation as needed.   Diet - low sodium heart healthy   Complete by: As directed    Do not put a pillow under the knee. Place it under the heel.   Complete by: As directed    Driving restrictions   Complete by: As directed    No driving for two weeks   Post-operative opioid taper instructions:   Complete by: As directed    POST-OPERATIVE OPIOID TAPER INSTRUCTIONS: It is important to wean off of your opioid medication as soon as possible. If you do not need pain medication after your surgery it is ok to stop day one. Opioids include: Codeine, Hydrocodone(Norco, Vicodin), Oxycodone(Percocet, oxycontin) and hydromorphone amongst others.  Long term  and even short term use of opiods can cause: Increased pain response Dependence Constipation Depression Respiratory depression And more.  Withdrawal symptoms can include Flu like symptoms Nausea, vomiting And more Techniques to manage these symptoms Hydrate well Eat regular healthy meals Stay active Use relaxation techniques(deep breathing, meditating, yoga) Do Not substitute Alcohol to help with tapering If you have been on opioids for less than two weeks and do not have pain than it is ok to stop all together.  Plan to wean off of opioids This plan should start within one week post op of your joint replacement. Maintain the same interval or time between taking each dose and first decrease the dose.  Cut the total daily intake of opioids by one tablet each day Next start to increase the time between doses. The last dose that should be eliminated is the evening dose.      TED hose   Complete by: As directed    Use stockings (TED hose) for three weeks on both leg(s).  You may remove them at night for sleeping.   Weight bearing as tolerated   Complete by: As directed         Follow-up Information     Ollen Gross, MD. Go on 03/01/2023.   Specialty: Orthopedic Surgery Why: You are scheduled for first post op appt on Tuesday July 30 at 1:15pm. Contact information: [address] [place] [address] [place] [address] [phone]                  Signed: Arther Abbott 02/21/2023, 7:51 AM

## 2023-02-23 ENCOUNTER — Encounter: Payer: Self-pay | Admitting: Acute Care

## 2023-02-23 DIAGNOSIS — M25661 Stiffness of right knee, not elsewhere classified: Secondary | ICD-10-CM | POA: Diagnosis not present

## 2023-02-23 DIAGNOSIS — M25561 Pain in right knee: Secondary | ICD-10-CM | POA: Diagnosis not present

## 2023-02-25 ENCOUNTER — Encounter: Payer: Self-pay | Admitting: Physician Assistant

## 2023-02-25 DIAGNOSIS — M25561 Pain in right knee: Secondary | ICD-10-CM | POA: Diagnosis not present

## 2023-02-25 DIAGNOSIS — M25661 Stiffness of right knee, not elsewhere classified: Secondary | ICD-10-CM | POA: Diagnosis not present

## 2023-03-01 DIAGNOSIS — M25561 Pain in right knee: Secondary | ICD-10-CM | POA: Diagnosis not present

## 2023-03-01 DIAGNOSIS — M25661 Stiffness of right knee, not elsewhere classified: Secondary | ICD-10-CM | POA: Diagnosis not present

## 2023-03-03 DIAGNOSIS — M25561 Pain in right knee: Secondary | ICD-10-CM | POA: Diagnosis not present

## 2023-03-03 DIAGNOSIS — M25661 Stiffness of right knee, not elsewhere classified: Secondary | ICD-10-CM | POA: Diagnosis not present

## 2023-03-07 DIAGNOSIS — M25561 Pain in right knee: Secondary | ICD-10-CM | POA: Diagnosis not present

## 2023-03-07 DIAGNOSIS — M25661 Stiffness of right knee, not elsewhere classified: Secondary | ICD-10-CM | POA: Diagnosis not present

## 2023-03-14 DIAGNOSIS — M25561 Pain in right knee: Secondary | ICD-10-CM | POA: Diagnosis not present

## 2023-03-14 DIAGNOSIS — M25661 Stiffness of right knee, not elsewhere classified: Secondary | ICD-10-CM | POA: Diagnosis not present

## 2023-03-17 DIAGNOSIS — M25561 Pain in right knee: Secondary | ICD-10-CM | POA: Diagnosis not present

## 2023-03-17 DIAGNOSIS — M25661 Stiffness of right knee, not elsewhere classified: Secondary | ICD-10-CM | POA: Diagnosis not present

## 2023-03-19 ENCOUNTER — Other Ambulatory Visit: Payer: Self-pay | Admitting: Physician Assistant

## 2023-03-21 DIAGNOSIS — M25561 Pain in right knee: Secondary | ICD-10-CM | POA: Diagnosis not present

## 2023-03-21 DIAGNOSIS — M25661 Stiffness of right knee, not elsewhere classified: Secondary | ICD-10-CM | POA: Diagnosis not present

## 2023-03-22 DIAGNOSIS — Z5189 Encounter for other specified aftercare: Secondary | ICD-10-CM | POA: Diagnosis not present

## 2023-04-08 ENCOUNTER — Encounter: Payer: Self-pay | Admitting: Physician Assistant

## 2023-04-17 ENCOUNTER — Other Ambulatory Visit: Payer: Self-pay | Admitting: Physician Assistant

## 2023-04-20 ENCOUNTER — Ambulatory Visit (INDEPENDENT_AMBULATORY_CARE_PROVIDER_SITE_OTHER): Payer: Medicare HMO | Admitting: Physician Assistant

## 2023-04-20 ENCOUNTER — Encounter: Payer: Self-pay | Admitting: Physician Assistant

## 2023-04-20 VITALS — BP 138/70 | HR 73 | Temp 98.0°F | Ht 66.0 in | Wt 196.0 lb

## 2023-04-20 DIAGNOSIS — M5432 Sciatica, left side: Secondary | ICD-10-CM | POA: Diagnosis not present

## 2023-04-20 MED ORDER — GABAPENTIN 300 MG PO CAPS
300.0000 mg | ORAL_CAPSULE | Freq: Three times a day (TID) | ORAL | 2 refills | Status: DC
Start: 2023-04-20 — End: 2023-05-31

## 2023-04-20 MED ORDER — METOPROLOL TARTRATE 25 MG PO TABS: 25.0000 mg | ORAL_TABLET | Freq: Two times a day (BID) | ORAL | 1 refills | Status: DC

## 2023-04-20 MED ORDER — LOSARTAN POTASSIUM 50 MG PO TABS: ORAL_TABLET | ORAL | 1 refills | Status: DC

## 2023-04-20 MED ORDER — OMEPRAZOLE 40 MG PO CPDR: DELAYED_RELEASE_CAPSULE | ORAL | 3 refills | Status: DC

## 2023-04-20 MED ORDER — PREDNISONE 20 MG PO TABS: ORAL_TABLET | ORAL | 0 refills | Status: AC

## 2023-04-20 MED ORDER — TRAMADOL HCL 50 MG PO TABS: 50.0000 mg | ORAL_TABLET | Freq: Four times a day (QID) | ORAL | 0 refills | Status: DC | PRN

## 2023-04-20 NOTE — Progress Notes (Signed)
Subjective:    Patient ID: Tracy Mckenzie, female    DOB: [birth year], 67 y.o.   MRN: [id number]  Chief Complaint  Patient presents with   Hip Pain    Lt hip pain has been going on for the past 2 mos    HPI Patient is in today for left lower hip and sciatic pain. Started a few weeks after her R TKA surgery (02/14/23), during PT exercises.  Trouble sleeping because of pain. Still having to take leftover post-surgical meds at night to help her get to sleep. Worried that she's going to be told she needs another surgery.   Methocarbamol and hydromorphone together  Hydroxyzine, Tramadol, and Tylenol together.   Gabapentin 300 mg really helps as well, but using this sparingly.   Past Medical History:  Diagnosis Date   Anemia    Arthritis    on meds   Bleeding ulcer    Cataract    recently dx-bilaterally   Dyspnea    sob realted to allergies and cough   Elevated cholesterol    on meds   GERD (gastroesophageal reflux disease)    on meds   Headache    Hiatal hernia    High blood pressure    on meds   Kidney cysts    Leukocytosis    idiopathic   Liver cyst    Nerve pain    L sciatic pain radiating to R   Osteoporosis    reaction to Prolia=   Peptic ulcer disease    PONV (postoperative nausea and vomiting)    Seasonal allergies     Past Surgical History:  Procedure Laterality Date   APPENDECTOMY  1992   BREAST CYST ASPIRATION Left 2003   COLONOSCOPY  2014   [facility]   KNEE ARTHROSCOPY Right 04/12/2001   torn cartilage   LAPAROSCOPY  1981   for endometriosis   LEG SURGERY Right 11/23/2000   to remove screws   LEG SURGERY Right 02/08/2002   to remove rod   MENISCUS REPAIR  01/29/2003   TIBIA FRACTURE SURGERY Right 06/12/2000   Multiple surgeries   TONSILLECTOMY  1967   TOTAL ABDOMINAL HYSTERECTOMY  1992   TOTAL KNEE ARTHROPLASTY Right 02/14/2023   Procedure: TOTAL KNEE ARTHROPLASTY;  Surgeon: Ollen Gross, MD;  Location: [facility];  Service:  Orthopedics;  Laterality: Right;   WISDOM TOOTH EXTRACTION      Family History  Problem Relation Age of Onset   Kidney disease Mother    COPD Mother    COPD Father    Breast cancer Sister 57   Breast cancer Maternal Aunt    Breast cancer Paternal Aunt    Diabetes Maternal Grandfather    Stroke Paternal Grandfather    Colon cancer Neg Hx    Colon polyps Neg Hx    Esophageal cancer Neg Hx    Rectal cancer Neg Hx    Stomach cancer Neg Hx     Social History   Tobacco Use   Smoking status: Never   Smokeless tobacco: Never  Vaping Use   Vaping status: Never Used  Substance Use Topics   Alcohol use: No   Drug use: No     Allergies  Allergen Reactions   Demerol Hcl [Meperidine] Anaphylaxis and Palpitations    flushed   Ibuprofen Other (See Comments)    Stomach ulcers.   Nsaids Other (See Comments)    Peptic ulcers   Simvastatin      Muscle Pain  Tape Other (See Comments)    "Sometimes removes skin"   Percocet [Oxycodone-Acetaminophen] Itching and Rash   Prolia [Denosumab] Rash   Vicodin [Hydrocodone-Acetaminophen] Itching    Review of Systems NEGATIVE UNLESS OTHERWISE INDICATED IN HPI      Objective:     BP 138/70   Pulse 73   Temp 98 F (36.7 C)   Ht 5\' 6"  (1.676 m)   Wt 196 lb (88.9 kg)   SpO2 97%   BMI 31.64 kg/m   Wt Readings from Last 3 Encounters:  04/20/23 196 lb (88.9 kg)  02/14/23 180 lb 6.4 oz (81.8 kg)  02/09/23 180 lb 6.4 oz (81.8 kg)    BP Readings from Last 3 Encounters:  04/20/23 138/70  02/15/23 (!) 154/72  02/09/23 128/72     Physical Exam Vitals and nursing note reviewed.  Constitutional:      Appearance: Normal appearance. She is obese.  Cardiovascular:     Rate and Rhythm: Normal rate and regular rhythm.  Pulmonary:     Effort: Pulmonary effort is normal.  Musculoskeletal:     Lumbar back: Positive left straight leg raise test. Negative right straight leg raise test.     Right hip: Normal. No tenderness or bony  tenderness. Normal range of motion. Normal strength.     Left hip: Normal. No tenderness or bony tenderness. Normal range of motion.     Right upper leg: Normal.     Left upper leg: Normal.     Right lower leg: No swelling, deformity or bony tenderness. No edema.     Left lower leg: No swelling, deformity or bony tenderness. No edema.     Comments: N/v intact bilateral lower extremities   Neurological:     General: No focal deficit present.     Mental Status: She is alert and oriented to person, place, and time.     Cranial Nerves: No cranial nerve deficit.     Motor: No weakness.     Gait: Gait normal.  Psychiatric:        Mood and Affect: Mood normal.        Assessment & Plan:  Left sided sciatica -     predniSONE; Take 3 tablets (60 mg total) by mouth daily with breakfast for 3 days, THEN 2 tablets (40 mg total) daily with breakfast for 3 days, THEN 1 tablet (20 mg total) daily with breakfast for 3 days, THEN 0.5 tablets (10 mg total) daily with breakfast for 3 days.  Dispense: 19.5 tablet; Refill: 0 -     Gabapentin; Take 1 capsule (300 mg total) by mouth 3 (three) times daily for 15 days.  Dispense: 45 capsule; Refill: 2  Other orders -     Losartan Potassium; TAKE 1 TABLET BY MOUTH TWICE A DAY FOR BLOOD PRESSURE  Dispense: 180 tablet; Refill: 1 -     Metoprolol Tartrate; Take 1 tablet (25 mg total) by mouth 2 (two) times daily.  Dispense: 180 tablet; Refill: 1 -     Omeprazole; TAKE 1 CAPSULE BY MOUTH DAILY 30 MINUTES BEFORE BREAKFAST  Dispense: 90 capsule; Refill: 3 -     traMADol HCl; Take 1-2 tablets (50-100 mg total) by mouth every 6 (six) hours as needed for moderate pain.  Dispense: 40 tablet; Refill: 0    Ok to take Gabapentin 300 mg up to three times a day as long as tolerating well, no dizzy or drowsiness.  Ok to take Tylenol 1000 mg three times  a day.  Tramadol at bedtime as needed for severe pain.  Take steroid taper as directed.   Continue PT exercises at  home for sciatic pain.     Return in about 4 weeks (around 05/18/2023) for recheck/follow-up.     Bralynn Velador M Cherre Kothari, PA-C

## 2023-04-20 NOTE — Patient Instructions (Addendum)
Ok to take Gabapentin 300 mg up to three times a day as long as tolerating well, no dizzy or drowsiness.  Ok to take Tylenol 1000 mg three times a day.  Tramadol at bedtime as needed for severe pain.  Take steroid taper as directed.   Continue PT exercises at home for sciatic pain.

## 2023-04-21 ENCOUNTER — Encounter: Payer: Self-pay | Admitting: Physician Assistant

## 2023-04-27 ENCOUNTER — Encounter: Payer: Self-pay | Admitting: Physician Assistant

## 2023-04-28 DIAGNOSIS — M5126 Other intervertebral disc displacement, lumbar region: Secondary | ICD-10-CM | POA: Diagnosis not present

## 2023-04-28 NOTE — Telephone Encounter (Signed)
Please review update and FYI from patient on Ortho appt and STAT MRI ordered.

## 2023-04-29 DIAGNOSIS — M545 Low back pain, unspecified: Secondary | ICD-10-CM | POA: Insufficient documentation

## 2023-04-29 DIAGNOSIS — M48062 Spinal stenosis, lumbar region with neurogenic claudication: Secondary | ICD-10-CM | POA: Diagnosis not present

## 2023-04-29 DIAGNOSIS — M5451 Vertebrogenic low back pain: Secondary | ICD-10-CM | POA: Diagnosis not present

## 2023-05-02 NOTE — Telephone Encounter (Signed)
Please see patient message as Tracy Mckenzie and advise if you would need patient to schedule OV.

## 2023-05-03 ENCOUNTER — Telehealth: Payer: Self-pay | Admitting: Internal Medicine

## 2023-05-03 ENCOUNTER — Ambulatory Visit (HOSPITAL_COMMUNITY): Payer: Self-pay | Admitting: Orthopedic Surgery

## 2023-05-03 ENCOUNTER — Telehealth: Payer: Self-pay

## 2023-05-03 NOTE — Telephone Encounter (Signed)
  Patient Consent for Virtual Visit         Tracy Mckenzie has provided verbal consent on 05/03/2023 for a virtual visit (video or telephone).   CONSENT FOR VIRTUAL VISIT FOR:  Tracy Mckenzie  By participating in this virtual visit I agree to the following:  I hereby voluntarily request, consent and authorize [facility] and its employed or contracted physicians, physician assistants, nurse practitioners or other licensed health care professionals (the Practitioner), to provide me with telemedicine health care services (the "Services") as deemed necessary by the treating Practitioner. I acknowledge and consent to receive the Services by the Practitioner via telemedicine. I understand that the telemedicine visit will involve communicating with the Practitioner through live audiovisual communication technology and the disclosure of certain medical information by electronic transmission. I acknowledge that I have been given the opportunity to request an in-person assessment or other available alternative prior to the telemedicine visit and am voluntarily participating in the telemedicine visit.  I understand that I have the right to withhold or withdraw my consent to the use of telemedicine in the course of my care at any time, without affecting my right to future care or treatment, and that the Practitioner or I may terminate the telemedicine visit at any time. I understand that I have the right to inspect all information obtained and/or recorded in the course of the telemedicine visit and may receive copies of available information for a reasonable fee.  I understand that some of the potential risks of receiving the Services via telemedicine include:  Delay or interruption in medical evaluation due to technological equipment failure or disruption; Information transmitted may not be sufficient (e.g. poor resolution of images) to allow for appropriate medical decision making by the  Practitioner; and/or  In rare instances, security protocols could fail, causing a breach of personal health information.  Furthermore, I acknowledge that it is my responsibility to provide information about my medical history, conditions and care that is complete and accurate to the best of my ability. I acknowledge that Practitioner's advice, recommendations, and/or decision may be based on factors not within their control, such as incomplete or inaccurate data provided by me or distortions of diagnostic images or specimens that may result from electronic transmissions. I understand that the practice of medicine is not an exact science and that Practitioner makes no warranties or guarantees regarding treatment outcomes. I acknowledge that a copy of this consent can be made available to me via my patient portal [facility] MyChart), or I can request a printed copy by calling the office of [facility].    I understand that my insurance will be billed for this visit.   I have read or had this consent read to me. I understand the contents of this consent, which adequately explains the benefits and risks of the Services being provided via telemedicine.  I have been provided ample opportunity to ask questions regarding this consent and the Services and have had my questions answered to my satisfaction. I give my informed consent for the services to be provided through the use of telemedicine in my medical care

## 2023-05-03 NOTE — Telephone Encounter (Signed)
Name: Tracy Mckenzie  DOB: [birth year]  MRN: [id number]  Primary Cardiologist: Dietrich Pates, MD   Preoperative team, please contact this patient and set up a phone call appointment for further preoperative risk assessment. Please obtain consent and complete medication review. Thank you for your help.  I confirm that guidance regarding antiplatelet and oral anticoagulation therapy has been completed and, if necessary, noted below.  None  I also confirmed the patient resides in the state of [place]. As per [facility] Medical Board telemedicine laws, the patient must reside in the state in which the provider is licensed.   Napoleon Form, Leodis Rains, NP 05/03/2023, 9:31 AM  [facility]

## 2023-05-03 NOTE — Telephone Encounter (Signed)
Patient agreeable with virtual appt but request to have her appt with in the next two weeks due to her surgeon possibly moving her procedure up.   Med list and consent reviewed and updated.

## 2023-05-03 NOTE — Telephone Encounter (Signed)
Pre-operative Risk Assessment    Patient Name: Tracy Mckenzie  DOB: 11-Feb-1956 MRN: [id number]   Last visit 12-22-22 Next visit 06-13-23      Request for Surgical Clearance    Procedure:   Decompression L4-S1  Date of Surgery:  Clearance 06/09/23                                 Surgeon:  Dr. Venita Lick Surgeon's Group or Practice Name:  [facility] Phone number:  [phone] Fax number:  [phone]   Type of Clearance Requested:   - Medical    Type of Anesthesia:  Not Indicated   Additional requests/questions:   Surgery schedular Madlyn Frankel   [phone]  Signed, Royann Shivers   05/03/2023, 9:21 AM

## 2023-05-05 ENCOUNTER — Encounter: Payer: Self-pay | Admitting: Physician Assistant

## 2023-05-11 NOTE — Progress Notes (Signed)
Surgical Instructions   Your procedure is scheduled on Thursday May 26, 2023. Report to [facility] Main Entrance "A" at 5:30 A.M., then check in with the Admitting office. Any questions or running late day of surgery: call [phone]  Questions prior to your surgery date: call [phone], Monday-Friday, 8am-4pm. If you experience any cold or flu symptoms such as cough, fever, chills, shortness of breath, etc. between now and your scheduled surgery, please notify us at the above number.     Remember:  Do not eat after midnight the night before your surgery  You may drink clear liquids until 4:30 the morning of your surgery.   Clear liquids allowed are: Water, Non-Citrus Juices (without pulp), Carbonated Beverages, Clear Tea, Black Coffee Only (NO MILK, CREAM OR POWDERED CREAMER of any kind), and Gatorade.    Take these medicines the morning of surgery with A SIP OF WATER  loratadine (CLARITIN)  metoprolol tartrate (LOPRESSOR)   May take these medicines IF NEEDED: acetaminophen (TYLENOL)  albuterol (VENTOLIN HFA) 108 (90 Base) MCG/ACT inhaler. Please bring inhaler with you to the hospital.  azelastine (ASTELIN) 0.1 % nasal spray  benzonatate (TESSALON)  gabapentin (NEURONTIN)  hydrOXYzine (ATARAX) nitroGLYCERIN (NITROSTAT).  If you use this medication, please call pre-op at [phone] omeprazole (PRILOSEC)  Propylene Glycol (SYSTANE COMPLETE)  traMADol (ULTRAM)    One week prior to surgery, STOP taking any Aspirin (unless otherwise instructed by your surgeon) Aleve, Naproxen, Ibuprofen, Motrin, Advil, Goody's, BC's, all herbal medications, fish oil, and non-prescription vitamins.                     Do NOT Smoke (Tobacco/Vaping) for 24 hours prior to your procedure.  If you use a CPAP at night, you may bring your mask/headgear for your overnight stay.   You will be asked to remove any contacts, glasses, piercing's, hearing aid's, dentures/partials prior to surgery.  Please bring cases for these items if needed.    Patients discharged the day of surgery will not be allowed to drive home, and someone needs to stay with them for 24 hours.  SURGICAL WAITING ROOM VISITATION Patients may have no more than 2 support people in the waiting area - these visitors may rotate.   Pre-op nurse will coordinate an appropriate time for 1 ADULT support person, who may not rotate, to accompany patient in pre-op.  Children under the age of 20 must have an adult with them who is not the patient and must remain in the main waiting area with an adult.  If the patient needs to stay at the hospital during part of their recovery, the visitor guidelines for inpatient rooms apply.  Please refer to the [facility] website for the visitor guidelines for any additional information.   If you received a COVID test during your pre-op visit  it is requested that you wear a mask when out in public, stay away from anyone that may not be feeling well and notify your surgeon if you develop symptoms. If you have been in contact with anyone that has tested positive in the last 10 days please notify you surgeon.      Pre-operative 5 CHG Bathing Instructions   You can play a key role in reducing the risk of infection after surgery. Your skin needs to be as free of germs as possible. You can reduce the number of germs on your skin by washing with CHG (chlorhexidine gluconate) soap before surgery. CHG is an antiseptic soap that  kills germs and continues to kill germs even after washing.   DO NOT use if you have an allergy to chlorhexidine/CHG or antibacterial soaps. If your skin becomes reddened or irritated, stop using the CHG and notify one of our RNs at [phone].   Please shower with the CHG soap starting 4 days before surgery using the following schedule:     Please keep in mind the following:  DO NOT shave, including legs and underarms, starting the day of your first shower.   You  may shave your face at any point before/day of surgery.  Place clean sheets on your bed the day you start using CHG soap. Use a clean washcloth (not used since being washed) for each shower. DO NOT sleep with pets once you start using the CHG.   CHG Shower Instructions:  Wash your face and private area with normal soap. If you choose to wash your hair, wash first with your normal shampoo.  After you use shampoo/soap, rinse your hair and body thoroughly to remove shampoo/soap residue.  Turn the water OFF and apply about 3 tablespoons (45 ml) of CHG soap to a CLEAN washcloth.  Apply CHG soap ONLY FROM YOUR NECK DOWN TO YOUR TOES (washing for 3-5 minutes)  DO NOT use CHG soap on face, private areas, open wounds, or sores.  Pay special attention to the area where your surgery is being performed.  If you are having back surgery, having someone wash your back for you may be helpful. Wait 2 minutes after CHG soap is applied, then you may rinse off the CHG soap.  Pat dry with a clean towel  Put on clean clothes/pajamas   If you choose to wear lotion, please use ONLY the CHG-compatible lotions on the back of this paper.   Additional instructions for the day of surgery: DO NOT APPLY any lotions, deodorants or perfumes.   Do not bring valuables to the hospital. [facility] is not responsible for any belongings/valuables. Do not wear nail polish, gel polish, artificial nails, or any other type of covering on natural nails (fingers and toes) Do not wear jewelry or makeup Put on clean/comfortable clothes.  Please brush your teeth.  Ask your nurse before applying any prescription medications to the skin.     CHG Compatible Lotions   Aveeno Moisturizing lotion  Cetaphil Moisturizing Cream  Cetaphil Moisturizing Lotion  Clairol Herbal Essence Moisturizing Lotion, Dry Skin  Clairol Herbal Essence Moisturizing Lotion, Extra Dry Skin  Clairol Herbal Essence Moisturizing Lotion, Normal Skin  Curel  Age Defying Therapeutic Moisturizing Lotion with Alpha Hydroxy  Curel Extreme Care Body Lotion  Curel Soothing Hands Moisturizing Hand Lotion  Curel Therapeutic Moisturizing Cream, Fragrance-Free  Curel Therapeutic Moisturizing Lotion, Fragrance-Free  Curel Therapeutic Moisturizing Lotion, Original Formula  Eucerin Daily Replenishing Lotion  Eucerin Dry Skin Therapy Plus Alpha Hydroxy Crme  Eucerin Dry Skin Therapy Plus Alpha Hydroxy Lotion  Eucerin Original Crme  Eucerin Original Lotion  Eucerin Plus Crme Eucerin Plus Lotion  Eucerin TriLipid Replenishing Lotion  Keri Anti-Bacterial Hand Lotion  Keri Deep Conditioning Original Lotion Dry Skin Formula Softly Scented  Keri Deep Conditioning Original Lotion, Fragrance Free Sensitive Skin Formula  Keri Lotion Fast Absorbing Fragrance Free Sensitive Skin Formula  Keri Lotion Fast Absorbing Softly Scented Dry Skin Formula  Keri Original Lotion  Keri Skin Renewal Lotion Keri Silky Smooth Lotion  Keri Silky Smooth Sensitive Skin Lotion  Nivea Body Creamy Conditioning Oil  Nivea Body Extra Enriched Lotion  Nivea Body Original Lotion  Nivea Body Sheer Moisturizing Lotion Nivea Crme  Nivea Skin Firming Lotion  NutraDerm 30 Skin Lotion  NutraDerm Skin Lotion  NutraDerm Therapeutic Skin Cream  NutraDerm Therapeutic Skin Lotion  ProShield Protective Hand Cream  Provon moisturizing lotion  Please read over the following fact sheets that you were given.

## 2023-05-12 ENCOUNTER — Encounter (HOSPITAL_COMMUNITY)
Admission: RE | Admit: 2023-05-12 | Discharge: 2023-05-12 | Disposition: A | Discharge status: Home / Self Care | Payer: Medicare HMO | Source: Ambulatory Visit | Attending: Orthopedic Surgery | Admitting: Orthopedic Surgery

## 2023-05-12 ENCOUNTER — Encounter (HOSPITAL_COMMUNITY): Payer: Self-pay

## 2023-05-12 ENCOUNTER — Other Ambulatory Visit: Payer: Self-pay

## 2023-05-12 VITALS — BP 139/76 | HR 73 | Temp 98.4°F | Resp 18 | Ht 66.0 in | Wt 178.4 lb

## 2023-05-12 DIAGNOSIS — M5416 Radiculopathy, lumbar region: Secondary | ICD-10-CM | POA: Insufficient documentation

## 2023-05-12 DIAGNOSIS — M48061 Spinal stenosis, lumbar region without neurogenic claudication: Secondary | ICD-10-CM | POA: Insufficient documentation

## 2023-05-12 DIAGNOSIS — Z01812 Encounter for preprocedural laboratory examination: Secondary | ICD-10-CM | POA: Insufficient documentation

## 2023-05-12 DIAGNOSIS — I4510 Unspecified right bundle-branch block: Secondary | ICD-10-CM | POA: Diagnosis not present

## 2023-05-12 DIAGNOSIS — Z01818 Encounter for other preprocedural examination: Secondary | ICD-10-CM | POA: Diagnosis present

## 2023-05-12 DIAGNOSIS — I10 Essential (primary) hypertension: Secondary | ICD-10-CM | POA: Insufficient documentation

## 2023-05-12 DIAGNOSIS — K219 Gastro-esophageal reflux disease without esophagitis: Secondary | ICD-10-CM | POA: Diagnosis not present

## 2023-05-12 LAB — CBC
HCT: 40.7 % (ref 36.0–46.0)
Hemoglobin: 13 g/dL (ref 12.0–15.0)
MCH: 29 pg (ref 26.0–34.0)
MCHC: 31.9 g/dL (ref 30.0–36.0)
MCV: 90.6 fL (ref 80.0–100.0)
Platelets: 385 10*3/uL (ref 150–400)
RBC: 4.49 MIL/uL (ref 3.87–5.11)
RDW: 13.6 % (ref 11.5–15.5)
WBC: 11.1 10*3/uL — ABNORMAL HIGH (ref 4.0–10.5)
nRBC: 0 % (ref 0.0–0.2)

## 2023-05-12 LAB — BASIC METABOLIC PANEL
Anion gap: 11 (ref 5–15)
BUN: 7 mg/dL — ABNORMAL LOW (ref 8–23)
CO2: 25 mmol/L (ref 22–32)
Calcium: 9.6 mg/dL (ref 8.9–10.3)
Chloride: 105 mmol/L (ref 98–111)
Creatinine, Ser: 0.92 mg/dL (ref 0.44–1.00)
GFR, Estimated: >60 mL/min (ref >60)
Glucose, Bld: 96 mg/dL (ref 70–99)
Potassium: 3.6 mmol/L (ref 3.5–5.1)
Sodium: 141 mmol/L (ref 135–145)

## 2023-05-12 LAB — SURGICAL PCR SCREEN
MRSA, PCR: NEGATIVE
Staphylococcus aureus: NEGATIVE

## 2023-05-12 NOTE — Progress Notes (Signed)
PCP - alyssa allwardt Cardiologist - paula ross  PPM/ICD - denies   Chest x-ray - n/a EKG - 12/22/22 Stress Test - over 10 years ago, normal per pt.  ECHO - 12/24/22 Cardiac Cath - denies  Sleep Study - denies  7 days prior to surgery STOP taking any Aspirin (unless otherwise instructed by your surgeon), Aleve, Naproxen, Ibuprofen, Motrin, Advil, Goody's, BC's, all herbal medications, fish oil, and all vitamins.     ERAS Protcol -yes PRE-SURGERY Ensure or G2- none ordered  COVID TEST- not needed   Anesthesia review: yes, has a telephone visit with paula ross on 10/16 for pre op clearance .  Patient denies shortness of breath, fever, cough and chest pain at PAT appointment   All instructions explained to the patient, with a verbal understanding of the material. Patient agrees to go over the instructions while at home for a better understanding. Patient also instructed to self quarantine after being tested for COVID-19. The opportunity to ask questions was provided.

## 2023-05-13 NOTE — Progress Notes (Addendum)
Anesthesia Chart Review:  Case: [id number] Date/Time: 05/26/23 0715   Procedure: Decompression L4-S1 - 3 C-Bed   Anesthesia type: General   Pre-op diagnosis: Lumbar spinal stenosis with radiculopathy   [facility] ROOM 04 / [facility]   Surgeons: Venita Lick, MD       DISCUSSION: Patient is a 67 year old female scheduled for the above procedure.  History includes never smoker, postoperative N/V, HTN, hypercholesterolemia, GERD, anemia, idiopathic leukocytosis, dyspnea, renal cyst, arthritis, osteoarthritis (right TKA 02/14/23).  She was evaluated by cardiologist Dr. Huston Foley in 2022 for atypical chest pain. Coronary calcium score ordered and was 29.9 (66th percentile).  Dr. Tenny Craw did not think mild CAD was causing her symptoms.  She did recommend Mediterranean diet, statin therapy, or if intolerant consider PCSK9i. As needed follow-up planned, but had more recent evaluation on 12/13/22 by Jari Favre, PA-C for preoperative evaluation prior to right TKA. She now has a telephone cardiology evaluation scheduled for 05/18/23.  Last pulmonology evaluation was on 01/21/23 with Kandice Robinsons, NP for follow-up chronic cough. She reported cough was much better.  She had started using Astelin and Claritin BID and benzonatate as needed for cough. She was taking gabapentin 100 mg at bedtime with hydroxyzine. Could consider weaning off gabapentin and decreasing Claritin to daily if cough improving. Six month follow-up planned.   Chart will be left for follow-up upcoming cardiology and primary care visits.   ADDENDUM 05/18/23 10:53 PM: She had PCP visit with Allwardt, Alyssa M, PA-C on 05/17/23 to discuss Omeprazole refill and tramadol refill due to increased pain before back surgery. Next follow-up in early 2025 to allow time for her to recover from surgery.   Preoperative cardiology telephonic evaluation on 05/18/23 with Jari Favre, PA-C. She noted patient not currently meeting 4 METS due to physical  limitation with her back. She tolerated right TKA on July. APP wrote, "Ms. Facundo's perioperative risk of a major cardiac event is 0.4% according to the Revised Cardiac Risk Index (RCRI).  Therefore, she is at low risk for perioperative complications.   Her functional capacity is poor at 3.3 METs according to the [facility] Activity Status Index (DASI). Recommendations: According to ACC/AHA guidelines, no further cardiovascular testing needed.  The patient may proceed to surgery at acceptable risk.    Anesthesia team to evaluate on the day of surgery.    VS: BP 139/76   Pulse 73   Temp 36.9 C (Oral)   Resp 18   Ht 5\' 6"  (1.676 m)   Wt 80.9 kg   SpO2 96%   BMI 28.79 kg/m    PROVIDERS: Allwardt, Crist Infante, PA-C is PCP  Dietrich Pates, MD is cardiologist Chilton Greathouse, MD is pulmonologist Coralyn Pear, MD is HEM.  She was evaluated in 2021-2022 for chronic low grade leukocytosis/neutrophilic predominance. Inflammatory markers have been negative.  Peripheral smear showed reactive appearing neutrophils but no other abnormalities. MPN ruled out with no abnormalities noted on JAK2 w/ reflex and BCR ABL. No signs of hematological malignancy, so bone marrow biopsy felt not needed.  As needed follow-up with continued CBC monitoring per PCP recommended.    LABS: Labs reviewed: Acceptable for surgery. A1c 5.8% 01/03/23. (all labs ordered are listed, but only abnormal results are displayed)  Labs Reviewed  BASIC METABOLIC PANEL - Abnormal; Notable for the following components:      Result Value   BUN 7 (*)    All other components within normal limits  CBC - Abnormal; Notable for  the following components:   WBC 11.1 (*)    All other components within normal limits  SURGICAL PCR SCREEN     IMAGES: [place] Abd 01/04/23: IMPRESSION: 1. A 6.5 cm simple cyst is identified in the left hepatic lobe, not significantly changed to slightly larger. No follow-up recommended for this cyst. 2. No other  abnormalities.   EKG: 12/22/2022: Normal sinus rhythm.  Left axis deviation.  Right bundle branch block.   CV: Echo 12/24/22: IMPRESSIONS   1. Probable liver cyst; suggest dedicated ultrasound to better assess.   2. Left ventricular ejection fraction, by estimation, is 60 to 65%. The  left ventricle has normal function. The left ventricle has no regional  wall motion abnormalities. Left ventricular diastolic parameters are  consistent with Grade I diastolic  dysfunction (impaired relaxation).   3. Right ventricular systolic function is normal. The right ventricular  size is normal.   4. The mitral valve is normal in structure. Trivial mitral valve  regurgitation. No evidence of mitral stenosis.   5. The aortic valve is tricuspid. Aortic valve regurgitation is not  visualized. No aortic stenosis is present.   6. The inferior vena cava is normal in size with greater than 50%  respiratory variability, suggesting right atrial pressure of 3 mmHg.  Comparison(s): No prior Echocardiogram.  - S/p [place] abd 01/04/23 that showed a 6.5 cm simple cyst in the left hepatic lobe, stable and no follow-up recommended per radiologist.    CT Cardiac Scoring 12/09/20: FINDINGS: Coronary arteries: Normal origins. Coronary Calcium Score: Left main: 5.6 Left anterior descending artery: 24.3 Left circumflex artery: 0 Right coronary artery: 0 Total: 29.9 Percentile: 66th   - Pericardium: Normal. - Ascending Aorta: Normal caliber. Scattered calcifications in the descending aorta and aortic arch.   IMPRESSION: Coronary calcium score of 29.9. This was 66th percentile for age-, race-, and sex-matched controls.   RECOMMENDATIONS: Coronary artery calcium (CAC) score is a strong predictor of incident coronary heart disease (CHD) and provides predictive information beyond traditional risk factors. CAC scoring is reasonable to use in the decision to withhold, postpone, or initiate statin therapy in  intermediate-risk or selected borderline-risk asymptomatic adults (age 33-75 years and LDL-C >=70 to <190 mg/dL) who do not have diabetes or established atherosclerotic cardiovascular disease (ASCVD).* In intermediate-risk (10-year ASCVD risk >=7.5% to <20%) adults or selected borderline-risk (10-year ASCVD risk >=5% to <7.5%) adults in whom a CAC score is measured for the purpose of making a treatment decision the following recommendations have been made...    If CAC is 1 to 99, it is reasonable to initiate statin therapy for patients >=6 years of age...    Stress echo 07/18/12 ([facility]): Normal stress echocardiogram=.    Past Medical History:  Diagnosis Date   Anemia    Arthritis    on meds   Bleeding ulcer    Cataract    recently dx-bilaterally   Dyspnea    sob realted to allergies and cough   Elevated cholesterol    on meds   GERD (gastroesophageal reflux disease)    on meds   Headache    Hiatal hernia    High blood pressure    on meds   Kidney cysts    Leukocytosis    idiopathic   Liver cyst    Nerve pain    L sciatic pain radiating to R   Osteoporosis    reaction to Prolia=   Peptic ulcer disease    PONV (postoperative nausea  and vomiting)    Seasonal allergies     Past Surgical History:  Procedure Laterality Date   APPENDECTOMY  1992   BREAST CYST ASPIRATION Left 2003   COLONOSCOPY  2014   [facility]-Sup[facility]   KNEE ARTHROSCOPY Right 04/12/2001   torn cartilage   LAPAROSCOPY  1981   for endometriosis   LEG SURGERY Right 11/23/2000   to remove screws   LEG SURGERY Right 02/08/2002   to remove rod   MENISCUS REPAIR  01/29/2003   TIBIA FRACTURE SURGERY Right 06/12/2000   Multiple surgeries   TONSILLECTOMY  1967   TOTAL ABDOMINAL HYSTERECTOMY  1992   TOTAL KNEE ARTHROPLASTY Right 02/14/2023   Procedure: TOTAL KNEE ARTHROPLASTY;  Surgeon: Ollen Gross, MD;  Location: [facility];  Service: Orthopedics;  Laterality: Right;   WISDOM TOOTH EXTRACTION       MEDICATIONS:  acetaminophen (TYLENOL) 500 MG tablet   albuterol (VENTOLIN HFA) 108 (90 Base) MCG/ACT inhaler   azelastine (ASTELIN) 0.1 % nasal spray   benzonatate (TESSALON) 100 MG capsule   gabapentin (NEURONTIN) 100 MG capsule   gabapentin (NEURONTIN) 300 MG capsule   hydrochlorothiazide (MICROZIDE) 12.5 MG capsule   hydrOXYzine (ATARAX) 10 MG tablet   loratadine (CLARITIN) 10 MG tablet   losartan (COZAAR) 50 MG tablet   metoprolol tartrate (LOPRESSOR) 25 MG tablet   montelukast (SINGULAIR) 10 MG tablet   nitroGLYCERIN (NITROSTAT) 0.4 MG SL tablet   omeprazole (PRILOSEC) 40 MG capsule   Propylene Glycol (SYSTANE COMPLETE) 0.6 % SOLN   Spacer/Aero-Holding Chambers (PRO COMFORT SPACER ADULT) MISC   traMADol (ULTRAM) 50 MG tablet   No current facility-administered medications for this encounter.    Shonna Chock, PA-C Surgical Short Stay/Anesthesiology [facility] Phone [phone] [facility] Phone [phone] 05/13/2023 5:10 PM

## 2023-05-17 ENCOUNTER — Ambulatory Visit: Payer: Medicare HMO | Admitting: Physician Assistant

## 2023-05-17 VITALS — BP 146/84 | HR 82 | Temp 97.7°F | Ht 66.0 in | Wt 178.6 lb

## 2023-05-17 DIAGNOSIS — M810 Age-related osteoporosis without current pathological fracture: Secondary | ICD-10-CM | POA: Diagnosis not present

## 2023-05-17 DIAGNOSIS — M5432 Sciatica, left side: Secondary | ICD-10-CM

## 2023-05-17 DIAGNOSIS — K219 Gastro-esophageal reflux disease without esophagitis: Secondary | ICD-10-CM

## 2023-05-17 MED ORDER — TRAMADOL HCL 50 MG PO TABS
50.0000 mg | ORAL_TABLET | Freq: Four times a day (QID) | ORAL | 0 refills | Status: DC | PRN
Start: 2023-05-17 — End: 2023-05-28

## 2023-05-17 MED ORDER — OMEPRAZOLE 40 MG PO CPDR
40.0000 mg | DELAYED_RELEASE_CAPSULE | Freq: Two times a day (BID) | ORAL | 3 refills | Status: DC | PRN
Start: 2023-05-17 — End: 2023-11-11

## 2023-05-17 NOTE — Progress Notes (Signed)
Subjective:    Patient ID: Tracy Mckenzie, female    DOB: Feb 07, 1956, 67 y.o.   MRN: [id number]  Chief Complaint  Patient presents with   Medical Management of Chronic Issues    Pt in office for follow up; pt is having back issues since recent knee replacement and having another procedure on 10/24. Pt needing more Omeprazole  but advised pt has refills on file with pharmacy. Also wanting to discuss tramadol needing to refill due to increased pain and wants to have done before procedure.    HPI  Discussed the use of AI scribe software for clinical note transcription with the patient, who gave verbal consent to proceed.  History of Present Illness   The patient, with a history of osteoporosis and recent knee surgery, presents with severe back pain that has been worsening daily. The pain is so severe that it affects the patient's mobility and ability to stand straight. The patient reports that the pain is also radiating down the leg. The patient has been using a shower stool due to the pain and mobility issues.  The patient's back pain started to worsen after the knee surgery when the patient started walking better. The patient reports that a disc in the back collapsed, causing the severe pain.  The patient also reports having a hard time straightening the knee after the recent knee surgery. However, the patient believes that the difficulty in straightening the knee is due to the back pain and not being able to stand up straight.  The patient has been managing the pain with gabapentin and tramadol. The patient takes gabapentin twice a day and tramadol every night to get comfortable enough to sleep. Occasionally, the patient takes an extra dose of gabapentin during the day and tramadol twice during the day when the pain is severe.  The patient also has osteoporosis and was previously taking a medication for it. However, the patient stopped taking the medication due to complications and has not  resumed taking it yet.       Past Medical History:  Diagnosis Date   Anemia    Arthritis    on meds   Bleeding ulcer    Cataract    recently dx-bilaterally   Dyspnea    sob realted to allergies and cough   Elevated cholesterol    on meds   GERD (gastroesophageal reflux disease)    on meds   Headache    Hiatal hernia    High blood pressure    on meds   Kidney cysts    Leukocytosis    idiopathic   Liver cyst    Nerve pain    L sciatic pain radiating to R   Osteoporosis    reaction to Prolia=   Peptic ulcer disease    PONV (postoperative nausea and vomiting)    Seasonal allergies     Past Surgical History:  Procedure Laterality Date   APPENDECTOMY  1992   BREAST CYST ASPIRATION Left 2003   COLONOSCOPY  2014   [facility]   KNEE ARTHROSCOPY Right 04/12/2001   torn cartilage   LAPAROSCOPY  1981   for endometriosis   LEG SURGERY Right 11/23/2000   to remove screws   LEG SURGERY Right 02/08/2002   to remove rod   MENISCUS REPAIR  01/29/2003   TIBIA FRACTURE SURGERY Right 06/12/2000   Multiple surgeries   TONSILLECTOMY  1967   TOTAL ABDOMINAL HYSTERECTOMY  1992   TOTAL KNEE ARTHROPLASTY Right 02/14/2023  Procedure: TOTAL KNEE ARTHROPLASTY;  Surgeon: Ollen Gross, MD;  Location: [facility];  Service: Orthopedics;  Laterality: Right;   WISDOM TOOTH EXTRACTION      Family History  Problem Relation Age of Onset   Kidney disease Mother    COPD Mother    COPD Father    Breast cancer Sister 37   Breast cancer Maternal Aunt    Breast cancer Paternal Aunt    Diabetes Maternal Grandfather    Stroke Paternal Grandfather    Colon cancer Neg Hx    Colon polyps Neg Hx    Esophageal cancer Neg Hx    Rectal cancer Neg Hx    Stomach cancer Neg Hx     Social History   Tobacco Use   Smoking status: Never   Smokeless tobacco: Never  Vaping Use   Vaping status: Never Used  Substance Use Topics   Alcohol use: No   Drug use: No     Allergies  Allergen  Reactions   Demerol Hcl [Meperidine] Anaphylaxis and Palpitations    flushed   Hydrocodone-Acetaminophen Other (See Comments)   Ibuprofen Other (See Comments)    Stomach ulcers.   Meperidine Hcl Other (See Comments)   Nsaids Other (See Comments)    Peptic ulcers   Scar Care [Cream Base]     Redness and heat   Simvastatin      Muscle Pain   Tape Other (See Comments)    "Sometimes removes skin"   Percocet [Oxycodone-Acetaminophen] Itching and Rash   Prolia [Denosumab] Rash   Vicodin [Hydrocodone-Acetaminophen] Itching   Wound Dressing Adhesive Rash    Review of Systems NEGATIVE UNLESS OTHERWISE INDICATED IN HPI      Objective:     BP (!) 146/84 (BP Location: Left Arm)   Pulse 82   Temp 97.7 F (36.5 C) (Temporal)   Ht 5\' 6"  (1.676 m)   Wt 178 lb 9.6 oz (81 kg)   SpO2 95%   BMI 28.83 kg/m   Wt Readings from Last 3 Encounters:  05/17/23 178 lb 9.6 oz (81 kg)  05/12/23 178 lb 6.4 oz (80.9 kg)  04/20/23 196 lb (88.9 kg)    BP Readings from Last 3 Encounters:  05/17/23 (!) 146/84  05/12/23 139/76  04/20/23 138/70     Physical Exam Vitals and nursing note reviewed.  Constitutional:      Appearance: Normal appearance.  Eyes:     Extraocular Movements: Extraocular movements intact.     Conjunctiva/sclera: Conjunctivae normal.     Pupils: Pupils are equal, round, and reactive to light.  Cardiovascular:     Rate and Rhythm: Normal rate and regular rhythm.  Pulmonary:     Effort: Pulmonary effort is normal.     Breath sounds: Normal breath sounds.  Neurological:     Mental Status: She is alert.     Gait: Gait abnormal.  Psychiatric:        Mood and Affect: Mood normal.        Behavior: Behavior normal.        Assessment & Plan:  Left sided sciatica -     traMADol HCl; Take 1-2 tablets (50-100 mg total) by mouth every 6 (six) hours as needed for moderate pain (pain score 4-6).  Dispense: 90 tablet; Refill: 0  Osteoporosis, unspecified osteoporosis type,  unspecified pathological fracture presence  Gastroesophageal reflux disease without esophagitis -     Omeprazole; Take 1 capsule (40 mg total) by mouth 2 (two) times daily  as needed (acid reflux).  Dispense: 90 capsule; Refill: 3   Degenerative Disc Disease Disc collapse causing severe back pain and leg pain. Scheduled for decompression at L4, L5, and L5, S1, including a foramenotomy to further decompress the L5 nerve root. -Continue current pain management regimen with Gabapentin 300mg  twice daily and Tramadol as needed. -Refill Omeprazole 40mg  daily as needed for stomach discomfort. -Undergo scheduled surgery on 05/26/2023.  Osteoporosis History of adverse reaction to Prolia. Currently not on any treatment due to financial constraints and upcoming surgery. -Consider discussing Evenity as a potential treatment option with orthopedic specialist post-surgery.   Follow-up -Reschedule December appointment to February or March 2025 to accommodate patient's work schedule and recovery from back surgery.         Return in about 4 months (around 09/17/2023) for recheck/follow-up.   Kaedence Connelly M Zafira Munos, PA-C

## 2023-05-17 NOTE — Patient Instructions (Signed)
Ask your orthopedic specialists about Evenity for osteoporosis treatment  Prescriptions refilled today  Best wishes and many prayers for your upcoming surgery!

## 2023-05-18 ENCOUNTER — Ambulatory Visit: Payer: Medicare HMO | Attending: Physician Assistant

## 2023-05-18 DIAGNOSIS — Z01810 Encounter for preprocedural cardiovascular examination: Secondary | ICD-10-CM | POA: Diagnosis not present

## 2023-05-18 NOTE — Anesthesia Preprocedure Evaluation (Addendum)
Anesthesia Evaluation  Patient identified by MRN, date of birth, ID band Patient awake    Reviewed: Allergy & Precautions, NPO status , Patient's Chart, lab work & pertinent test results  History of Anesthesia Complications (+) PONV and history of anesthetic complications  Airway Mallampati: III  TM Distance: >3 FB Neck ROM: Full    Dental  (+) Dental Advisory Given, Chipped,    Pulmonary neg pulmonary ROS   Pulmonary exam normal breath sounds clear to auscultation       Cardiovascular hypertension, + CAD  Normal cardiovascular exam+ dysrhythmias  Rhythm:Regular Rate:Normal  Echo 12/24/22: IMPRESSIONS   1. Probable liver cyst; suggest dedicated ultrasound to better assess.   2. Left ventricular ejection fraction, by estimation, is 60 to 65%. The  left ventricle has normal function. The left ventricle has no regional  wall motion abnormalities. Left ventricular diastolic parameters are  consistent with Grade I diastolic  dysfunction (impaired relaxation).   3. Right ventricular systolic function is normal. The right ventricular  size is normal.   4. The mitral valve is normal in structure. Trivial mitral valve  regurgitation. No evidence of mitral stenosis.   5. The aortic valve is tricuspid. Aortic valve regurgitation is not  visualized. No aortic stenosis is present.   6. The inferior vena cava is normal in size with greater than 50%  respiratory variability, suggesting right atrial pressure of 3 mmHg.  Comparison(s): No prior Echocardiogram.  - S/p [place] abd 01/04/23 that showed a 6.5 cm simple cyst in the left hepatic lobe, stable and no follow-up recommended per radiologist.  CT Cardiac Scoring 12/09/20: FINDINGS: Coronary arteries: Normal origins. Coronary Calcium Score: Left main: 5.6 Left anterior descending artery: 24.3 Left circumflex artery: 0 Right coronary artery: 0 Total: 29.9 Percentile: 66th   - Pericardium:  Normal. - Ascending Aorta: Normal caliber. Scattered calcifications in the descending aorta and aortic arch.   IMPRESSION: Coronary calcium score of 29.9. This was 66th percentile for age-, race-, and sex-matched controls.     Neuro/Psych  Headaches  negative psych ROS   GI/Hepatic Neg liver ROS, hiatal hernia, PUD,GERD  ,,  Endo/Other  negative endocrine ROS    Renal/GU negative Renal ROS  negative genitourinary   Musculoskeletal  (+) Arthritis ,    Abdominal   Peds  Hematology negative hematology ROS (+)   Anesthesia Other Findings   Reproductive/Obstetrics                             Anesthesia Physical Anesthesia Plan  ASA: 3  Anesthesia Plan: General   Post-op Pain Management: Tylenol PO (pre-op)*   Induction: Intravenous  PONV Risk Score and Plan: 4 or greater and Dexamethasone, Ondansetron and Treatment may vary due to age or medical condition  Airway Management Planned: Oral ETT  Additional Equipment:   Intra-op Plan:   Post-operative Plan: Extubation in OR  Informed Consent: I have reviewed the patients History and Physical, chart, labs and discussed the procedure including the risks, benefits and alternatives for the proposed anesthesia with the patient or authorized representative who has indicated his/her understanding and acceptance.     Dental advisory given  Plan Discussed with: CRNA  Anesthesia Plan Comments: (PAT note written by Shonna Chock, PA-C.  )       Anesthesia Quick Evaluation

## 2023-05-18 NOTE — Progress Notes (Signed)
Virtual Visit via Telephone Note   Because of Tracy Mckenzie's co-morbid illnesses, she is at least at moderate risk for complications without adequate follow up.  This format is felt to be most appropriate for this patient at this time.  The patient did not have access to video technology/had technical difficulties with video requiring transitioning to audio format only (telephone).  All issues noted in this document were discussed and addressed.  No physical exam could be performed with this format.  Please refer to the patient's chart for her consent to telehealth for [facility].  Evaluation Performed:  Preoperative cardiovascular risk assessment _____________   Date:  05/18/2023   Patient ID:  Tracy Mckenzie, DOB 1956-01-17, MRN [id number] Patient Location:  Home Provider location:   Office  Primary Care Provider:  Allwardt, Crist Infante, PA-C Primary Cardiologist:  Dietrich Pates, MD  Chief Complaint / Patient Profile   67 y.o. y/o female with a h/o hypertension and hyperlipidemia who is pending decompression L4-S1 and presents today for telephonic preoperative cardiovascular risk assessment.  History of Present Illness    Tracy Mckenzie is a 67 y.o. female who presents via audio/video conferencing for a telehealth visit today.  Pt was last seen in cardiology clinic on 12/22/2022 by myself.  At that time Murl Maccarone was doing well other than some shortness of breath which required an updated echocardiogram.  The patient is now pending procedure as outlined above. Since her last visit, she tells me she has been doing well from a CV standpoint.  Surgery is next week for her back and due to a lot of pain she has not been able to do much physically.  She has been working for part-time jobs from her bed.  She is able to move from room to room.  However, she does not meet 4 METS currently it is not due to cardiac reasons but due to her physical limitations with her back.  After her knee  surgery she was not requiring any assist devices and was doing really well however, shortly after her back started to bother her now she is requiring a decompression.  When she leaves the house she usually uses a cane because she states her leg is numb and she does not want a fall.  No medications indicated  Past Medical History    Past Medical History:  Diagnosis Date   Anemia    Arthritis    on meds   Bleeding ulcer    Cataract    recently dx-bilaterally   Dyspnea    sob realted to allergies and cough   Elevated cholesterol    on meds   GERD (gastroesophageal reflux disease)    on meds   Headache    Hiatal hernia    High blood pressure    on meds   Kidney cysts    Leukocytosis    idiopathic   Liver cyst    Nerve pain    L sciatic pain radiating to R   Osteoporosis    reaction to Prolia=   Peptic ulcer disease    PONV (postoperative nausea and vomiting)    Seasonal allergies    Past Surgical History:  Procedure Laterality Date   APPENDECTOMY  1992   BREAST CYST ASPIRATION Left 2003   COLONOSCOPY  2014   [facility]   KNEE ARTHROSCOPY Right 04/12/2001   torn cartilage   LAPAROSCOPY  1981   for endometriosis   LEG SURGERY Right 11/23/2000   to  remove screws   LEG SURGERY Right 02/08/2002   to remove rod   MENISCUS REPAIR  01/29/2003   TIBIA FRACTURE SURGERY Right 06/12/2000   Multiple surgeries   TONSILLECTOMY  1967   TOTAL ABDOMINAL HYSTERECTOMY  1992   TOTAL KNEE ARTHROPLASTY Right 02/14/2023   Procedure: TOTAL KNEE ARTHROPLASTY;  Surgeon: Ollen Gross, MD;  Location: [facility];  Service: Orthopedics;  Laterality: Right;   WISDOM TOOTH EXTRACTION      Allergies  Allergies  Allergen Reactions   Demerol Hcl [Meperidine] Anaphylaxis and Palpitations    flushed   Hydrocodone-Acetaminophen Other (See Comments)   Ibuprofen Other (See Comments)    Stomach ulcers.   Meperidine Hcl Other (See Comments)   Nsaids Other (See Comments)    Peptic ulcers    Scar Care [Cream Base]     Redness and heat   Simvastatin      Muscle Pain   Tape Other (See Comments)    "Sometimes removes skin"   Percocet [Oxycodone-Acetaminophen] Itching and Rash   Prolia [Denosumab] Rash   Vicodin [Hydrocodone-Acetaminophen] Itching   Wound Dressing Adhesive Rash    Home Medications    Prior to Admission medications   Medication Sig Start Date End Date Taking? Authorizing Provider  acetaminophen (TYLENOL) 500 MG tablet Take 1,000 mg by mouth every 6 (six) hours as needed for moderate pain.    [provider]  albuterol (VENTOLIN HFA) 108 (90 Base) MCG/ACT inhaler Inhale 2 puffs into the lungs every 6 (six) hours as needed for wheezing or shortness of breath. 11/23/22   Allwardt, Crist Infante, PA-C  azelastine (ASTELIN) 0.1 % nasal spray Place 1 spray into both nostrils 2 (two) times daily as needed for rhinitis or allergies. Patient taking differently: Place 1 spray into both nostrils 2 (two) times daily. 01/07/23   Bevelyn Ngo, NP  benzonatate (TESSALON) 100 MG capsule TAKE 1 TO 2 CAPSULES BY MOUTH TWICE A DAY AS NEEDED FOR COUGH 12/01/22   Allwardt, Alyssa M, PA-C  gabapentin (NEURONTIN) 100 MG capsule Take 1 capsule (100 mg total) by mouth at bedtime. Patient taking differently: Take 100 mg by mouth daily as needed (pain). 01/21/23   Bevelyn Ngo, NP  gabapentin (NEURONTIN) 300 MG capsule Take 1 capsule (300 mg total) by mouth 3 (three) times daily for 15 days. Patient taking differently: Take 300 mg by mouth 2 (two) times daily. 04/20/23 05/05/23  Allwardt, Crist Infante, PA-C  hydrochlorothiazide (MICROZIDE) 12.5 MG capsule Take 1 capsule (12.5 mg total) by mouth daily. 01/19/23   Allwardt, Crist Infante, PA-C  hydrOXYzine (ATARAX) 10 MG tablet Take 1 tablet (10 mg total) by mouth every 6 (six) hours. Patient taking differently: Take 10 mg by mouth every 6 (six) hours as needed for itching, nausea or vomiting (taken when taking pain medication). 08/12/22   Allwardt,  Crist Infante, PA-C  loratadine (CLARITIN) 10 MG tablet Take 10 mg by mouth 2 (two) times daily.    [provider]  losartan (COZAAR) 50 MG tablet TAKE 1 TABLET BY MOUTH TWICE A DAY FOR BLOOD PRESSURE 04/20/23   Allwardt, Alyssa M, PA-C  metoprolol tartrate (LOPRESSOR) 25 MG tablet Take 1 tablet (25 mg total) by mouth 2 (two) times daily. 04/20/23   Allwardt, Alyssa M, PA-C  montelukast (SINGULAIR) 10 MG tablet TAKE ONE TABLET BY MOUTH AT BEDTIME 10/21/22   Allwardt, Alyssa M, PA-C  nitroGLYCERIN (NITROSTAT) 0.4 MG SL tablet Place 1 tablet (0.4 mg total) under the tongue every  5 (five) minutes as needed for chest pain. 11/27/20   Orland Mustard, MD  omeprazole (PRILOSEC) 40 MG capsule Take 1 capsule (40 mg total) by mouth 2 (two) times daily as needed (acid reflux). 05/17/23   Allwardt, Crist Infante, PA-C  Propylene Glycol (SYSTANE COMPLETE) 0.6 % SOLN Place 1 drop into both eyes as needed (dry eyes).    [provider]  Spacer/Aero-Holding Chambers (PRO COMFORT SPACER ADULT) MISC 1 Device by Does not apply route as needed. 03/07/20   Orland Mustard, MD  traMADol (ULTRAM) 50 MG tablet Take 1-2 tablets (50-100 mg total) by mouth every 6 (six) hours as needed for moderate pain (pain score 4-6). 05/17/23   Allwardt, Crist Infante, PA-C    Physical Exam    Vital Signs:  Senia Mock does not have vital signs available for review today.  Given telephonic nature of communication, physical exam is limited. AAOx3. NAD. Normal affect.  Speech and respirations are unlabored.  Accessory Clinical Findings    None  Assessment & Plan    1.  Preoperative Cardiovascular Risk Assessment:  Ms. Walenta perioperative risk of a major cardiac event is 0.4% according to the Revised Cardiac Risk Index (RCRI).  Therefore, she is at low risk for perioperative complications.   Her functional capacity is poor at 3.3 METs according to the [facility] Activity Status Index (DASI). Recommendations: According to ACC/AHA  guidelines, no further cardiovascular testing needed.  The patient may proceed to surgery at acceptable risk.     The patient was advised that if she develops new symptoms prior to surgery to contact our office to arrange for a follow-up visit, and she verbalized understanding.   A copy of this note will be routed to requesting surgeon.  Time:   Today, I have spent 9 minutes with the patient with telehealth technology discussing medical history, symptoms, and management plan.     Sharlene Dory, PA-C  05/18/2023, 8:32 AM

## 2023-05-19 ENCOUNTER — Other Ambulatory Visit: Payer: Self-pay | Admitting: Physician Assistant

## 2023-05-26 ENCOUNTER — Ambulatory Visit (HOSPITAL_COMMUNITY): Payer: Medicare HMO | Admitting: Physician Assistant

## 2023-05-26 ENCOUNTER — Ambulatory Visit (HOSPITAL_COMMUNITY): Payer: Medicare HMO

## 2023-05-26 ENCOUNTER — Other Ambulatory Visit: Payer: Self-pay

## 2023-05-26 ENCOUNTER — Ambulatory Visit (HOSPITAL_BASED_OUTPATIENT_CLINIC_OR_DEPARTMENT_OTHER): Payer: Medicare HMO | Admitting: Anesthesiology

## 2023-05-26 ENCOUNTER — Encounter (HOSPITAL_COMMUNITY): Payer: Self-pay | Admitting: Orthopedic Surgery

## 2023-05-26 ENCOUNTER — Observation Stay (HOSPITAL_COMMUNITY)
Admission: RE | Admit: 2023-05-26 | Discharge: 2023-05-28 | Disposition: A | Discharge status: Home / Self Care | Payer: Medicare HMO | Attending: Orthopedic Surgery | Admitting: Orthopedic Surgery

## 2023-05-26 ENCOUNTER — Encounter (HOSPITAL_COMMUNITY)
Admission: RE | Disposition: A | Discharge status: Home / Self Care | Payer: Self-pay | Source: Home / Self Care | Attending: Orthopedic Surgery

## 2023-05-26 DIAGNOSIS — M4805 Spinal stenosis, thoracolumbar region: Secondary | ICD-10-CM | POA: Diagnosis not present

## 2023-05-26 DIAGNOSIS — I10 Essential (primary) hypertension: Secondary | ICD-10-CM | POA: Diagnosis not present

## 2023-05-26 DIAGNOSIS — R2681 Unsteadiness on feet: Secondary | ICD-10-CM | POA: Insufficient documentation

## 2023-05-26 DIAGNOSIS — M5417 Radiculopathy, lumbosacral region: Secondary | ICD-10-CM | POA: Diagnosis not present

## 2023-05-26 DIAGNOSIS — M48061 Spinal stenosis, lumbar region without neurogenic claudication: Principal | ICD-10-CM | POA: Diagnosis present

## 2023-05-26 DIAGNOSIS — M5416 Radiculopathy, lumbar region: Secondary | ICD-10-CM | POA: Diagnosis not present

## 2023-05-26 DIAGNOSIS — R2689 Other abnormalities of gait and mobility: Secondary | ICD-10-CM | POA: Diagnosis not present

## 2023-05-26 DIAGNOSIS — I2510 Atherosclerotic heart disease of native coronary artery without angina pectoris: Secondary | ICD-10-CM

## 2023-05-26 DIAGNOSIS — M48062 Spinal stenosis, lumbar region with neurogenic claudication: Secondary | ICD-10-CM | POA: Diagnosis not present

## 2023-05-26 DIAGNOSIS — Z79899 Other long term (current) drug therapy: Secondary | ICD-10-CM | POA: Insufficient documentation

## 2023-05-26 DIAGNOSIS — M4856XA Collapsed vertebra, not elsewhere classified, lumbar region, initial encounter for fracture: Secondary | ICD-10-CM | POA: Diagnosis not present

## 2023-05-26 DIAGNOSIS — M4807 Spinal stenosis, lumbosacral region: Secondary | ICD-10-CM | POA: Diagnosis present

## 2023-05-26 HISTORY — PX: LUMBAR LAMINECTOMY/DECOMPRESSION MICRODISCECTOMY: SHX5026

## 2023-05-26 SURGERY — LUMBAR LAMINECTOMY/DECOMPRESSION MICRODISCECTOMY 2 LEVELS
Anesthesia: General

## 2023-05-26 MED ORDER — METOPROLOL TARTRATE 25 MG PO TABS
25.0000 mg | ORAL_TABLET | Freq: Two times a day (BID) | ORAL | Status: DC
  Administered 2023-05-26 – 2023-05-27 (×3): 25 mg via ORAL
  Filled 2023-05-26 (×3): qty 1

## 2023-05-26 MED ORDER — CEFAZOLIN SODIUM-DEXTROSE 2-4 GM/100ML-% IV SOLN
2.0000 g | INTRAVENOUS | Status: AC
  Administered 2023-05-26: 2 g via INTRAVENOUS
  Filled 2023-05-26: qty 100

## 2023-05-26 MED ORDER — DEXAMETHASONE SODIUM PHOSPHATE 10 MG/ML IJ SOLN
INTRAMUSCULAR | Status: DC | PRN
  Administered 2023-05-26: 10 mg via INTRAVENOUS

## 2023-05-26 MED ORDER — ONDANSETRON HCL 4 MG/2ML IJ SOLN
4.0000 mg | Freq: Four times a day (QID) | INTRAMUSCULAR | Status: DC | PRN
  Administered 2023-05-27: 4 mg via INTRAVENOUS
  Filled 2023-05-26: qty 2

## 2023-05-26 MED ORDER — ACETAMINOPHEN 325 MG PO TABS
650.0000 mg | ORAL_TABLET | ORAL | Status: DC | PRN
  Administered 2023-05-26 – 2023-05-27 (×3): 650 mg via ORAL
  Filled 2023-05-26 (×3): qty 2

## 2023-05-26 MED ORDER — SODIUM CHLORIDE 0.9% FLUSH
3.0000 mL | INTRAVENOUS | Status: DC | PRN
Start: 2023-05-26 — End: 2023-05-27

## 2023-05-26 MED ORDER — 0.9 % SODIUM CHLORIDE (POUR BTL) OPTIME
TOPICAL | Status: DC | PRN
  Administered 2023-05-26 (×2): 1000 mL

## 2023-05-26 MED ORDER — MAGNESIUM CITRATE PO SOLN
1.0000 | Freq: Once | ORAL | Status: AC | PRN
  Administered 2023-05-26: 1 via ORAL
  Filled 2023-05-26: qty 296

## 2023-05-26 MED ORDER — SODIUM CHLORIDE 0.9 % IV SOLN: 250.0000 mL | INTRAVENOUS | Status: DC

## 2023-05-26 MED ORDER — TRANEXAMIC ACID-NACL 1000-0.7 MG/100ML-% IV SOLN
1000.0000 mg | INTRAVENOUS | Status: AC
  Administered 2023-05-26: 1000 mg via INTRAVENOUS
  Filled 2023-05-26: qty 100

## 2023-05-26 MED ORDER — METHOCARBAMOL 500 MG PO TABS
500.0000 mg | ORAL_TABLET | Freq: Four times a day (QID) | ORAL | Status: DC | PRN
  Administered 2023-05-26 – 2023-05-28 (×6): 500 mg via ORAL
  Filled 2023-05-26 (×6): qty 1

## 2023-05-26 MED ORDER — FENTANYL CITRATE (PF) 100 MCG/2ML IJ SOLN
25.0000 ug | INTRAMUSCULAR | Status: DC | PRN
  Administered 2023-05-26: 25 ug via INTRAVENOUS

## 2023-05-26 MED ORDER — LIDOCAINE 2% (20 MG/ML) 5 ML SYRINGE
INTRAMUSCULAR | Status: DC | PRN
  Administered 2023-05-26: 30 mg via INTRAVENOUS

## 2023-05-26 MED ORDER — THROMBIN 20000 UNITS EX SOLR: CUTANEOUS | Status: DC | PRN

## 2023-05-26 MED ORDER — LACTATED RINGERS IV SOLN: INTRAVENOUS | Status: DC

## 2023-05-26 MED ORDER — ALBUTEROL SULFATE HFA 108 (90 BASE) MCG/ACT IN AERS: 2.0000 | INHALATION_SPRAY | Freq: Four times a day (QID) | RESPIRATORY_TRACT | Status: DC | PRN

## 2023-05-26 MED ORDER — MIDAZOLAM HCL 2 MG/2ML IJ SOLN
INTRAMUSCULAR | Status: AC
  Filled 2023-05-26: qty 2

## 2023-05-26 MED ORDER — EPHEDRINE 5 MG/ML INJ
INTRAVENOUS | Status: AC
  Filled 2023-05-26: qty 10

## 2023-05-26 MED ORDER — BUPIVACAINE-EPINEPHRINE (PF) 0.25% -1:200000 IJ SOLN
INTRAMUSCULAR | Status: AC
  Filled 2023-05-26: qty 30

## 2023-05-26 MED ORDER — FENTANYL CITRATE (PF) 250 MCG/5ML IJ SOLN
INTRAMUSCULAR | Status: AC
  Filled 2023-05-26: qty 5

## 2023-05-26 MED ORDER — NITROGLYCERIN 0.4 MG SL SUBL: 0.4000 mg | SUBLINGUAL_TABLET | SUBLINGUAL | Status: DC | PRN

## 2023-05-26 MED ORDER — ACETAMINOPHEN 500 MG PO TABS: 1000.0000 mg | ORAL_TABLET | Freq: Once | ORAL | Status: DC

## 2023-05-26 MED ORDER — ORAL CARE MOUTH RINSE: 15.0000 mL | Freq: Once | OROMUCOSAL | Status: AC

## 2023-05-26 MED ORDER — PHENYLEPHRINE 80 MCG/ML (10ML) SYRINGE FOR IV PUSH (FOR BLOOD PRESSURE SUPPORT)
PREFILLED_SYRINGE | INTRAVENOUS | Status: AC
Start: 2023-05-26 — End: ?
  Filled 2023-05-26: qty 40

## 2023-05-26 MED ORDER — HYDROXYZINE HCL 10 MG PO TABS
10.0000 mg | ORAL_TABLET | Freq: Three times a day (TID) | ORAL | Status: DC | PRN
  Administered 2023-05-26 – 2023-05-28 (×4): 10 mg via ORAL
  Filled 2023-05-26 (×6): qty 1

## 2023-05-26 MED ORDER — ONDANSETRON HCL 4 MG PO TABS: 4.0000 mg | ORAL_TABLET | Freq: Four times a day (QID) | ORAL | Status: DC | PRN

## 2023-05-26 MED ORDER — PROPOFOL 10 MG/ML IV BOLUS
INTRAVENOUS | Status: AC
  Filled 2023-05-26: qty 20

## 2023-05-26 MED ORDER — PROPOFOL 10 MG/ML IV BOLUS
INTRAVENOUS | Status: DC | PRN
  Administered 2023-05-26: 130 mg via INTRAVENOUS

## 2023-05-26 MED ORDER — LIDOCAINE 2% (20 MG/ML) 5 ML SYRINGE
INTRAMUSCULAR | Status: AC
  Filled 2023-05-26: qty 20

## 2023-05-26 MED ORDER — HYDROCHLOROTHIAZIDE 12.5 MG PO TABS
12.5000 mg | ORAL_TABLET | Freq: Every day | ORAL | Status: DC
  Administered 2023-05-26 – 2023-05-27 (×2): 12.5 mg via ORAL
  Filled 2023-05-26 (×2): qty 1

## 2023-05-26 MED ORDER — ONDANSETRON HCL 4 MG/2ML IJ SOLN
INTRAMUSCULAR | Status: DC | PRN
  Administered 2023-05-26: 4 mg via INTRAVENOUS

## 2023-05-26 MED ORDER — SODIUM CHLORIDE 0.9% FLUSH
3.0000 mL | Freq: Two times a day (BID) | INTRAVENOUS | Status: DC
  Administered 2023-05-26: 3 mL via INTRAVENOUS

## 2023-05-26 MED ORDER — ROCURONIUM BROMIDE 10 MG/ML (PF) SYRINGE
PREFILLED_SYRINGE | INTRAVENOUS | Status: AC
Start: 2023-05-26 — End: ?
  Filled 2023-05-26: qty 40

## 2023-05-26 MED ORDER — ACETAMINOPHEN 650 MG RE SUPP: 650.0000 mg | RECTAL | Status: DC | PRN

## 2023-05-26 MED ORDER — DEXAMETHASONE SODIUM PHOSPHATE 10 MG/ML IJ SOLN
INTRAMUSCULAR | Status: AC
  Filled 2023-05-26: qty 3

## 2023-05-26 MED ORDER — FENTANYL CITRATE (PF) 250 MCG/5ML IJ SOLN
INTRAMUSCULAR | Status: DC | PRN
  Administered 2023-05-26 (×3): 50 ug via INTRAVENOUS

## 2023-05-26 MED ORDER — POLYETHYLENE GLYCOL 3350 17 G PO PACK
17.0000 g | PACK | Freq: Every day | ORAL | Status: DC | PRN
Start: 2023-05-26 — End: 2023-05-28

## 2023-05-26 MED ORDER — LOSARTAN POTASSIUM 50 MG PO TABS
50.0000 mg | ORAL_TABLET | Freq: Two times a day (BID) | ORAL | Status: DC
  Administered 2023-05-26 – 2023-05-27 (×2): 50 mg via ORAL
  Filled 2023-05-26 (×2): qty 1

## 2023-05-26 MED ORDER — CHLORHEXIDINE GLUCONATE 0.12 % MT SOLN
15.0000 mL | Freq: Once | OROMUCOSAL | Status: AC
  Administered 2023-05-26: 15 mL via OROMUCOSAL
  Filled 2023-05-26: qty 15

## 2023-05-26 MED ORDER — BUPIVACAINE-EPINEPHRINE (PF) 0.25% -1:200000 IJ SOLN
INTRAMUSCULAR | Status: DC | PRN
  Administered 2023-05-26: 10 mL

## 2023-05-26 MED ORDER — FENTANYL CITRATE (PF) 100 MCG/2ML IJ SOLN
INTRAMUSCULAR | Status: AC
  Filled 2023-05-26: qty 2

## 2023-05-26 MED ORDER — SUGAMMADEX SODIUM 200 MG/2ML IV SOLN
INTRAVENOUS | Status: DC | PRN
  Administered 2023-05-26: 200 mg via INTRAVENOUS

## 2023-05-26 MED ORDER — OXYCODONE HCL 5 MG PO TABS
10.0000 mg | ORAL_TABLET | ORAL | Status: DC | PRN
  Administered 2023-05-26 – 2023-05-28 (×10): 10 mg via ORAL
  Filled 2023-05-26 (×10): qty 2

## 2023-05-26 MED ORDER — EPHEDRINE SULFATE-NACL 50-0.9 MG/10ML-% IV SOSY
PREFILLED_SYRINGE | INTRAVENOUS | Status: DC | PRN
  Administered 2023-05-26 (×2): 10 mg via INTRAVENOUS

## 2023-05-26 MED ORDER — OXYCODONE HCL 5 MG PO TABS
5.0000 mg | ORAL_TABLET | ORAL | Status: DC | PRN
  Administered 2023-05-28: 5 mg via ORAL
  Filled 2023-05-26 (×2): qty 1

## 2023-05-26 MED ORDER — MORPHINE SULFATE (PF) 2 MG/ML IV SOLN: 2.0000 mg | INTRAVENOUS | Status: AC | PRN

## 2023-05-26 MED ORDER — SURGIFLO WITH THROMBIN (HEMOSTATIC MATRIX KIT) OPTIME
TOPICAL | Status: DC | PRN
  Administered 2023-05-26 (×2): 1 via TOPICAL

## 2023-05-26 MED ORDER — PHENYLEPHRINE 80 MCG/ML (10ML) SYRINGE FOR IV PUSH (FOR BLOOD PRESSURE SUPPORT)
PREFILLED_SYRINGE | INTRAVENOUS | Status: DC | PRN
  Administered 2023-05-26 (×2): 160 ug via INTRAVENOUS

## 2023-05-26 MED ORDER — THROMBIN 20000 UNITS EX SOLR
CUTANEOUS | Status: AC
  Filled 2023-05-26: qty 20000

## 2023-05-26 MED ORDER — METHOCARBAMOL 500 MG PO TABS
500.0000 mg | ORAL_TABLET | Freq: Three times a day (TID) | ORAL | 0 refills | Status: AC | PRN
Start: 2023-05-26 — End: 2023-05-31

## 2023-05-26 MED ORDER — PHENYLEPHRINE HCL-NACL 20-0.9 MG/250ML-% IV SOLN
INTRAVENOUS | Status: DC | PRN
  Administered 2023-05-26: 50 ug/min via INTRAVENOUS

## 2023-05-26 MED ORDER — CEFAZOLIN SODIUM-DEXTROSE 1-4 GM/50ML-% IV SOLN
1.0000 g | Freq: Three times a day (TID) | INTRAVENOUS | Status: AC
  Administered 2023-05-26 (×2): 1 g via INTRAVENOUS
  Filled 2023-05-26 (×2): qty 50

## 2023-05-26 MED ORDER — MIDAZOLAM HCL 2 MG/2ML IJ SOLN
INTRAMUSCULAR | Status: DC | PRN
  Administered 2023-05-26: 1 mg via INTRAVENOUS

## 2023-05-26 MED ORDER — ONDANSETRON HCL 4 MG PO TABS: 4.0000 mg | ORAL_TABLET | Freq: Three times a day (TID) | ORAL | 0 refills | Status: DC | PRN

## 2023-05-26 MED ORDER — METHOCARBAMOL 1000 MG/10ML IJ SOLN: 500.0000 mg | Freq: Four times a day (QID) | INTRAMUSCULAR | Status: DC | PRN

## 2023-05-26 MED ORDER — ONDANSETRON HCL 4 MG/2ML IJ SOLN
INTRAMUSCULAR | Status: AC
Start: 2023-05-26 — End: ?
  Filled 2023-05-26: qty 8

## 2023-05-26 MED ORDER — ROCURONIUM BROMIDE 10 MG/ML (PF) SYRINGE
PREFILLED_SYRINGE | INTRAVENOUS | Status: DC | PRN
  Administered 2023-05-26: 10 mg via INTRAVENOUS
  Administered 2023-05-26: 50 mg via INTRAVENOUS

## 2023-05-26 MED ORDER — OXYCODONE-ACETAMINOPHEN 10-325 MG PO TABS: 1.0000 | ORAL_TABLET | Freq: Four times a day (QID) | ORAL | 0 refills | Status: AC | PRN

## 2023-05-26 SURGICAL SUPPLY — 61 items
AGENT HMST KT MTR STRL THRMB (HEMOSTASIS) ×2
BAG COUNTER SPONGE SURGICOUNT (BAG) ×1 IMPLANT
BAG SPNG CNTER NS LX DISP (BAG) ×1
BAND INSRT 18 STRL LF DISP RB (MISCELLANEOUS)
BAND RUBBER #18 3X1/16 STRL (MISCELLANEOUS) IMPLANT
BUR EGG ELITE 4.0 (BURR) IMPLANT
BUR MATCHSTICK NEURO 3.0 LAGG (BURR) IMPLANT
CLSR STERI-STRIP ANTIMIC 1/2X4 (GAUZE/BANDAGES/DRESSINGS) ×1 IMPLANT
CORD BIPOLAR FORCEPS 12FT (ELECTRODE) ×1 IMPLANT
DRAIN CHANNEL 15F RND FF W/TCR (WOUND CARE) IMPLANT
DRAPE MICROSCOPE LEICA 46X105 (MISCELLANEOUS) IMPLANT
DRAPE POUCH INSTRU U-SHP 10X18 (DRAPES) ×1 IMPLANT
DRAPE SURG 17X11 SM STRL (DRAPES) ×1 IMPLANT
DRAPE U-SHAPE 47X51 STRL (DRAPES) ×1 IMPLANT
DRSG OPSITE POSTOP 4X6 (GAUZE/BANDAGES/DRESSINGS) IMPLANT
DURAPREP 26ML APPLICATOR (WOUND CARE) ×1 IMPLANT
ELECT BLADE 4.0 EZ CLEAN MEGAD (MISCELLANEOUS)
ELECT CAUTERY BLADE 6.4 (BLADE) ×1 IMPLANT
ELECT PENCIL ROCKER SW 15FT (MISCELLANEOUS) ×1 IMPLANT
ELECT REM PT RETURN 9FT ADLT (ELECTROSURGICAL) ×1
ELECTRODE BLDE 4.0 EZ CLN MEGD (MISCELLANEOUS) IMPLANT
ELECTRODE REM PT RTRN 9FT ADLT (ELECTROSURGICAL) ×1 IMPLANT
EVACUATOR SILICONE 100CC (DRAIN) IMPLANT
GLOVE BIO SURGEON STRL SZ 6.5 (GLOVE) ×1 IMPLANT
GLOVE BIOGEL PI IND STRL 6.5 (GLOVE) ×1 IMPLANT
GLOVE BIOGEL PI IND STRL 8.5 (GLOVE) ×1 IMPLANT
GLOVE SS BIOGEL STRL SZ 8.5 (GLOVE) ×1 IMPLANT
GOWN STRL REUS W/ TWL LRG LVL3 (GOWN DISPOSABLE) ×1 IMPLANT
GOWN STRL REUS W/TWL 2XL LVL3 (GOWN DISPOSABLE) ×2 IMPLANT
GOWN STRL REUS W/TWL LRG LVL3 (GOWN DISPOSABLE) ×1
KIT BASIN OR (CUSTOM PROCEDURE TRAY) ×1 IMPLANT
NDL 22X1.5 STRL (OR ONLY) (MISCELLANEOUS) ×1 IMPLANT
NDL SPNL 18GX3.5 QUINCKE PK (NEEDLE) ×2 IMPLANT
NEEDLE 22X1.5 STRL (OR ONLY) (MISCELLANEOUS) ×1 IMPLANT
NEEDLE SPNL 18GX3.5 QUINCKE PK (NEEDLE) ×2 IMPLANT
NS IRRIG 1000ML POUR BTL (IV SOLUTION) ×1 IMPLANT
PACK LAMINECTOMY ORTHO (CUSTOM PROCEDURE TRAY) ×1 IMPLANT
PACK UNIVERSAL I (CUSTOM PROCEDURE TRAY) ×1 IMPLANT
PATTIES SURGICAL .5 X.5 (GAUZE/BANDAGES/DRESSINGS) ×1 IMPLANT
PATTIES SURGICAL .5 X1 (DISPOSABLE) ×1 IMPLANT
SPONGE SURGIFOAM ABS GEL 100 (HEMOSTASIS) IMPLANT
SPONGE T-LAP 4X18 ~~LOC~~+RFID (SPONGE) IMPLANT
STAPLER VISISTAT 35W (STAPLE) IMPLANT
SURGIFLO W/THROMBIN 8M KIT (HEMOSTASIS) IMPLANT
SUT BONE WAX W31G (SUTURE) ×1 IMPLANT
SUT MNCRL AB 3-0 PS2 27 (SUTURE) ×1 IMPLANT
SUT MNCRL+ AB 3-0 CT1 36 (SUTURE) IMPLANT
SUT STRATAFIX 1PDS 45CM VIOLET (SUTURE) IMPLANT
SUT VIC AB 1 CT1 18XCR BRD 8 (SUTURE) ×1 IMPLANT
SUT VIC AB 1 CT1 27 (SUTURE)
SUT VIC AB 1 CT1 27XBRD ANTBC (SUTURE) IMPLANT
SUT VIC AB 1 CT1 8-18 (SUTURE) ×1
SUT VIC AB 2-0 CT1 18 (SUTURE) ×1 IMPLANT
SUT VICRYL 0 UR6 27IN ABS (SUTURE) ×1 IMPLANT
SYR BULB IRRIG 60ML STRL (SYRINGE) ×1 IMPLANT
SYR CONTROL 10ML LL (SYRINGE) ×1 IMPLANT
TOWEL GREEN STERILE (TOWEL DISPOSABLE) ×1 IMPLANT
TOWEL GREEN STERILE FF (TOWEL DISPOSABLE) ×1 IMPLANT
TRAY FOLEY MTR SLVR 16FR STAT (SET/KITS/TRAYS/PACK) IMPLANT
WATER STERILE IRR 1000ML POUR (IV SOLUTION) ×1 IMPLANT
YANKAUER SUCT BULB TIP NO VENT (SUCTIONS) IMPLANT

## 2023-05-26 NOTE — Discharge Instructions (Signed)

## 2023-05-26 NOTE — Op Note (Signed)
OPERATIVE REPORT  DATE OF SURGERY: 05/26/2023  PATIENT NAME:  Noya Griffus MRN: [id number] DOB: 1956/02/01  PCP: Bary Leriche, PA-C  PRE-OPERATIVE DIAGNOSIS: Lumbar spinal stenosis L4-S1 with neurogenic claudication  POST-OPERATIVE DIAGNOSIS: Same  PROCEDURE:   L4-S1 posterior decompression with lateral recess and foraminal decompression  SURGEON:  Venita Lick, MD  PHYSICIAN ASSISTANT: Clifton James  ANESTHESIA:   General  EBL: 100 ml   Complications: None  BRIEF HISTORY: Serenity Schnorr is a 67 y.o. female who scented to my office with complaints of severe back and bilateral radicular leg pain with a left foot drop.  Imaging demonstrated severe spinal stenosis L4-5 L5-S1 with significant left lateral recess stenosis.  I resulted in severe neurogenic claudication and neurological deficit she like to move forward with surgery.  All appropriate risks, benefits, and alternatives to surgery were discussed and consent was obtained.  PROCEDURE DETAILS: Patient was brought into the operating room and was properly positioned on the operating room table.  After induction with general anesthesia the patient was endotracheally intubated.  A timeout was taken to confirm all important data: including patient, procedure, and the level. Teds, SCD's were applied.   Patient was turned prone onto the Wilson frame and all bony prominences were well-padded.  The back was then prepped and draped in a standard fashion.  2 needles were placed into the spine pain x-ray was taken for localization of the incision.  I marked out my incision site to span the L4-5 and L5-S1 level was infiltrated with quarter percent Marcaine with epinephrine.  Midline incision was made and sharp dissection was carried out down to the deep fascia.  Deep fascia was sharply incised and I stripped the paraspinal muscles bilaterally to expose the spinous process of L4, L5, and the superior portion of S1.  I then dissected out  over the lamina to expose the L4 and L5 lamina.  The facets were identified but care was taken not to violate the facet capsule.  The Penfield 4 was then placed underneath the L4 lamina and a second x-ray was taken and read by the radiologist confirming that we are at the appropriate level.  The entire L5 spinous process was removed with a double-action Leksell rongeur and then using a Kerrison rongeur I performed a generous central laminotomy of L5.  I then dissected through the ligamentum flavum until I could create a plane between the thecal sac and the ligamentum flavum.  Using Kerrison punches I resected the ligamentum flavum and began working into the lateral recess.  The medial portion of the facet was taken down in order to completely decompress the lateral recess.  I then was able to visualize the medial border of the pedicle.  I then identified the S1 nerve root and tract and into the foramen and performed a foraminotomy.  This was done bilaterally.  Once I had the L5-S1 level completely decompressed I then performed a L4-5 decompression.  Using the same technique I performed a generous central laminotomy and proceeded to work into the lateral recess.  There was a larger facet osteophytes and thickening of the ligamentum flavum at this level consistent with what was seen on the preoperative MRI.  Once the central decompression was complete I was able to dissect and into the lateral recess and complete bilateral recess and foraminal decompression.  I then plated the L5 laminectomy and the lateral recess decompression.  Once the decompression was completed I took an x-ray confirming I was spanning from  the L4 foramen to the S1 foramen.  This included the maximum area of spinal stenosis seen on the preoperative MRI.  I then exposed the small disc protrusion and I was seen on the MRI I did not feel as though formal discectomy was required.  Nerve root was completely decompressed and no longer under tension  and compromising the disc would accelerate the degenerative process at this level.  At this point I was able to freely pass my nerve hook superiorly out into the L4 foramen bilaterally as well as the L5 and S1 foramen.  The L4-L5 and S1 nerve roots were all freely mobile.  There was no further compromise centrally.  Once the decompression proved satisfactory I irrigated the wound copiously normal saline and make sure that hemostasis using proper electrocautery.  Epidural veins were identified and coagulated after final irrigation I removed the retractors and closed the deep fascia with a running #1 strata fix suture.  I then closed superficial with interrupted 2-0 Vicryl sutures and a 3-0 Monocryl for the skin.  Steri-Strips and dry dressings were applied and the patient was ultimately extubated transferred the PACU without incident.  The end of the case all needle sponge counts were correct.    Venita Lick, MD 05/26/2023 11:01 AM

## 2023-05-26 NOTE — Transfer of Care (Signed)
Immediate Anesthesia Transfer of Care Note  Patient: Tracy Mckenzie  Procedure(s) Performed: Lumbar four-five, Lumbar five-Sacral one Decompression  Patient Location: PACU  Anesthesia Type:General  Level of Consciousness: awake  Airway & Oxygen Therapy: Patient Spontanous Breathing and Patient connected to nasal cannula oxygen  Post-op Assessment: Report given to RN and Post -op Vital signs reviewed and stable  Post vital signs: Reviewed and stable  Last Vitals:  Vitals Value Taken Time  BP 167/84 05/26/23 1102  Temp    Pulse 92 05/26/23 1105  Resp 15 05/26/23 1105  SpO2 94 % 05/26/23 1105  Vitals shown include unfiled device data.  Last Pain:  Vitals:   05/26/23 0626  TempSrc:   PainSc: 3          Complications: No notable events documented.

## 2023-05-26 NOTE — Brief Op Note (Signed)
05/26/2023  11:09 AM  PATIENT:  Tracy Mckenzie  67 y.o. female  PRE-OPERATIVE DIAGNOSIS:  Lumbar spinal stenosis with radiculopathy  POST-OPERATIVE DIAGNOSIS:  Lumbar spinal stenosis with radiculopathy  PROCEDURE:  Procedure(s) with comments: Lumbar four-five, Lumbar five-Sacral one Decompression (N/A) - 3 C-Bed  SURGEON:  Surgeons and Role:    Venita Lick, MD - Primary  PHYSICIAN ASSISTANT:   ASSISTANTS: Roderic Palau   ANESTHESIA:   general  EBL:  100 mL   BLOOD ADMINISTERED:none  DRAINS: none   LOCAL MEDICATIONS USED:  MARCAINE     SPECIMEN:  No Specimen  DISPOSITION OF SPECIMEN:  N/A  COUNTS:  YES  TOURNIQUET:  * No tourniquets in log *  DICTATION: .Dragon Dictation  PLAN OF CARE: Admit for overnight observation  PATIENT DISPOSITION:  PACU - hemodynamically stable.

## 2023-05-26 NOTE — H&P (Signed)
History:  Tracy Mckenzie is a very pleasant 67 year old woman who has been dealing with back pain for several years. She states that over the last 2 months she has noticed significant increase in radicular left leg pain. She is recovering from a right total knee replacement that went very well. She states that he over the last week she has developed a foot drop and progressively worse radicular leg pain. As result an MRI was ordered and she presents to me today to discuss surgical intervention for her radiculopathy/foot drop.  Past Medical History:  Diagnosis Date   Anemia    Arthritis    on meds   Bleeding ulcer    Cataract    recently dx-bilaterally   Dyspnea    sob realted to allergies and cough   Elevated cholesterol    on meds   GERD (gastroesophageal reflux disease)    on meds   Headache    Hiatal hernia    High blood pressure    on meds   Kidney cysts    Leukocytosis    idiopathic   Liver cyst    Nerve pain    L sciatic pain radiating to R   Osteoporosis    reaction to Prolia=   Peptic ulcer disease    PONV (postoperative nausea and vomiting)    Seasonal allergies     Allergies  Allergen Reactions   Demerol Hcl [Meperidine] Anaphylaxis and Palpitations    flushed   Hydrocodone-Acetaminophen Other (See Comments)   Ibuprofen Other (See Comments)    Stomach ulcers.   Meperidine Hcl Other (See Comments)   Nsaids Other (See Comments)    Peptic ulcers   Scar Care [Cream Base]     Redness and heat   Simvastatin      Muscle Pain   Tape Other (See Comments)    "Sometimes removes skin"   Percocet [Oxycodone-Acetaminophen] Itching and Rash   Prolia [Denosumab] Rash   Vicodin [Hydrocodone-Acetaminophen] Itching   Wound Dressing Adhesive Rash    No current facility-administered medications on file prior to encounter.   Current Outpatient Medications on File Prior to Encounter  Medication Sig Dispense Refill   acetaminophen (TYLENOL) 500 MG tablet Take 1,000 mg by  mouth every 6 (six) hours as needed for moderate pain.     azelastine (ASTELIN) 0.1 % nasal spray Place 1 spray into both nostrils 2 (two) times daily as needed for rhinitis or allergies. (Patient taking differently: Place 1 spray into both nostrils 2 (two) times daily.) 30 mL 5   gabapentin (NEURONTIN) 100 MG capsule Take 1 capsule (100 mg total) by mouth at bedtime. (Patient taking differently: Take 100 mg by mouth daily as needed (pain).) 30 capsule 3   gabapentin (NEURONTIN) 300 MG capsule Take 1 capsule (300 mg total) by mouth 3 (three) times daily for 15 days. (Patient taking differently: Take 300 mg by mouth 2 (two) times daily.) 45 capsule 2   hydrochlorothiazide (MICROZIDE) 12.5 MG capsule Take 1 capsule (12.5 mg total) by mouth daily. 90 capsule 3   hydrOXYzine (ATARAX) 10 MG tablet Take 1 tablet (10 mg total) by mouth every 6 (six) hours. (Patient taking differently: Take 10 mg by mouth every 6 (six) hours as needed for itching, nausea or vomiting (taken when taking pain medication).) 60 tablet 2   loratadine (CLARITIN) 10 MG tablet Take 10 mg by mouth 2 (two) times daily.     losartan (COZAAR) 50 MG tablet TAKE 1 TABLET BY  MOUTH TWICE A DAY FOR BLOOD PRESSURE 180 tablet 1   metoprolol tartrate (LOPRESSOR) 25 MG tablet Take 1 tablet (25 mg total) by mouth 2 (two) times daily. 180 tablet 1   montelukast (SINGULAIR) 10 MG tablet TAKE ONE TABLET BY MOUTH AT BEDTIME 90 tablet 3   Propylene Glycol (SYSTANE COMPLETE) 0.6 % SOLN Place 1 drop into both eyes as needed (dry eyes).     albuterol (VENTOLIN HFA) 108 (90 Base) MCG/ACT inhaler Inhale 2 puffs into the lungs every 6 (six) hours as needed for wheezing or shortness of breath. 18 g 1   nitroGLYCERIN (NITROSTAT) 0.4 MG SL tablet Place 1 tablet (0.4 mg total) under the tongue every 5 (five) minutes as needed for chest pain. 15 tablet 1   Spacer/Aero-Holding Chambers (PRO COMFORT SPACER ADULT) MISC 1 Device by Does not apply route as needed. 1 each  1    Physical Exam: Vitals:   05/26/23 0605  BP: (!) 153/87  Pulse: 74  Resp: 18  Temp: 98.5 F (36.9 C)  SpO2: 90%   Body mass index is 29.05 kg/m. Clinical exam: Tracy Mckenzie is a pleasant individual, who appears younger than their stated age.  She is alert and orientated 3.  No shortness of breath, chest pain.  Abdomen is soft and non-tender, negative loss of bowel and bladder control, no rebound tenderness.  Negative: skin lesions abrasions contusions  Peripheral pulses: 2+ peripheral pulses bilaterally. LE compartments are: Soft and nontender  Gait pattern: Altered gait pattern due to left lower extremity radiculopathy and foot drop.  Assistive devices: None  Neuro: 5/5 motor strength in the right lower extremity. 5/5 motor strength in the left quad, hip flexor, gastrocnemius. 4 -/5 EHL/tibialis anterior strength. Positive left straight leg raise test. Positive numbness and decreased sensation light touch in the left L5 dermatome. Negative Babinski test. No clonus.  Musculoskeletal: Moderate to severe low back pain in the midline radiating into the paraspinal region. Significant pain starting in the left paraspinal/gluteal region radiating down the left leg. No hip, knee, ankle pain with isolated joint range of motion.  Imaging: x-rays of the lumbar spine demonstrate multilevel degenerative disc disease L2-S1 with a 22 degree concave to the right degenerative scoliosis apex L2-3. Old L1 compression fracture is noted.  Lumbar MRI: completed on 04/28/2023. Chronic L1 compression fracture. Multilevel advanced degenerative disc disease L2-S1. Patient has advanced central stenosis L4-5 L5-S1 with slight cephalad disc extrusion on the left side at L4-5 causing marked lateral recess stenosis with compression of the traversing L5 nerve root.  A/P: Tracy Mckenzie is a very pleasant 67 year old woman with chronic back pain and acute neurological deficits. Patient has a foot drop with significant left  L5 radicular pain. Her MRI shows significant spinal stenosis L4-5 and L5-S1. The L4-5 stenosis is also compounded by a disc fragment causing progressive lateral recess stenosis affecting the traversing L5 nerve root. Patient also has significant foraminal stenosis at L5-S1 which would further compromise the L5 nerve root. Patient's clinical exam is consistent with L5 radiculopathy with motor and sensory deficits.  I have gone over the imaging studies and the pathology with the patient and her husband and they both expressed an understanding. We talked about nonoperative treatment such as injection therapy and physical therapy versus a surgical decompression. Given the severity of her pain and the neurological deficits she is elected to move forward with surgery.  Surgical procedure: L4-S1 decompression. We will focus the decompression at L4-5 with a more left-sided decompression  at L5-S1 including a foraminotomy to further decompress the L5 nerve root. Risks and benefits of decompression/discectomy: Infection, bleeding, death, stroke, paralysis, ongoing or worse pain, need for additional surgery, leak of spinal fluid, adjacent segment degeneration requiring additional surgery, post-operative hematoma formation that can result in neurological compromise and the need for urgent/emergent re-operation. Loss in bowel and bladder control. Injury to major vessels that could result in the need for urgent abdominal surgery to stop bleeding. Risk of deep venous thrombosis (DVT) and the need for additional treatment. Recurrent disc herniation resulting in the need for revision surgery, which could include fusion surgery (utilizing instrumentation such as pedicle screws and intervertebral cages). Additional risk: If instrumentation is used there is a risk of migration, or breakage of that hardware that could require additional surgery.

## 2023-05-26 NOTE — Anesthesia Procedure Notes (Signed)
Procedure Name: Intubation Date/Time: 05/26/2023 7:49 AM  Performed by: Gwenyth Allegra, CRNAPre-anesthesia Checklist: Patient identified, Emergency Drugs available, Suction available, Patient being monitored and Timeout performed Patient Re-evaluated:Patient Re-evaluated prior to induction Oxygen Delivery Method: Circle system utilized Preoxygenation: Pre-oxygenation with 100% oxygen Induction Type: IV induction Ventilation: Mask ventilation without difficulty Grade View: Grade III Tube type: Oral Tube size: 7.0 mm Number of attempts: 1 Airway Equipment and Method: Stylet Placement Confirmation: ETT inserted through vocal cords under direct vision, positive ETCO2 and breath sounds checked- equal and bilateral Secured at: 22 cm Tube secured with: Tape Dental Injury: Teeth and Oropharynx as per pre-operative assessment

## 2023-05-27 ENCOUNTER — Encounter (HOSPITAL_COMMUNITY): Payer: Self-pay | Admitting: Orthopedic Surgery

## 2023-05-27 DIAGNOSIS — M4807 Spinal stenosis, lumbosacral region: Secondary | ICD-10-CM | POA: Diagnosis not present

## 2023-05-27 MED ORDER — GABAPENTIN 300 MG PO CAPS
300.0000 mg | ORAL_CAPSULE | Freq: Two times a day (BID) | ORAL | Status: DC
  Administered 2023-05-27 – 2023-05-28 (×3): 300 mg via ORAL
  Filled 2023-05-27 (×3): qty 1

## 2023-05-27 MED ORDER — PANTOPRAZOLE SODIUM 40 MG PO TBEC
80.0000 mg | DELAYED_RELEASE_TABLET | Freq: Every day | ORAL | Status: DC
Start: 2023-05-27 — End: 2023-05-28
  Administered 2023-05-27: 80 mg via ORAL
  Filled 2023-05-27 (×2): qty 2

## 2023-05-27 MED ORDER — ALUM & MAG HYDROXIDE-SIMETH 200-200-20 MG/5ML PO SUSP
30.0000 mL | Freq: Four times a day (QID) | ORAL | Status: DC | PRN
  Administered 2023-05-27: 30 mL via ORAL
  Filled 2023-05-27: qty 30

## 2023-05-27 NOTE — Evaluation (Signed)
Physical Therapy Evaluation Patient Details Name: Tracy Mckenzie MRN: [id number] DOB: [birth year] Today's Date: 05/27/2023  History of Present Illness  67 y.o. female who presented with complaints of severe back and bilateral radicular leg pain with a left foot drop.  Imaging demonstrated severe spinal stenosis L4-5 L5-S1 with significant left lateral recess stenosis. She is now s/p L4-5 and L5-S1 decompression. PMH of Cataract, osteoporosis, Leukocytosis, sciatica, GERD.  Clinical Impression  Pt presents to physical therapy with deficits in strength and ROM in the lumbar region. Pt was able to tolerate ambulating household distances with the use of a RW. Pt  Pt was educated on back precautions, safe car transfers, and back brace adjustments. PT will focus on attempting stair training the next session. Pt will benefit from skilled physical therapy to address remaining functional deficits.      If plan is discharge home, recommend the following: A little help with walking and/or transfers;A little help with bathing/dressing/bathroom;Assistance with cooking/housework;Assist for transportation   Can travel by private vehicle        Equipment Recommendations Rolling walker (2 wheels)  Recommendations for Other Services       Functional Status Assessment Patient has had a recent decline in their functional status and demonstrates the ability to make significant improvements in function in a reasonable and predictable amount of time.     Precautions / Restrictions Precautions Precautions: Back Precaution Booklet Issued: Yes (comment) Required Braces or Orthoses: Spinal Brace Spinal Brace: Lumbar corset;Applied in sitting position Restrictions Weight Bearing Restrictions: No      Mobility  Bed Mobility Overal bed mobility: Needs Assistance Bed Mobility: Sit to Supine       Sit to supine: Supervision (Pt required verbal cues to follow back precautions)   General bed mobility  comments: educated pt on log roll, normally her HOB is slightly elevated. Environment replicated to setup home environment.    Transfers Overall transfer level: Needs assistance Equipment used: Rolling walker (2 wheels) Transfers: Sit to/from Stand Sit to Stand: Contact guard assist           General transfer comment: Verbal cues required for upright standing and proper use of RW    Ambulation/Gait Ambulation/Gait assistance: Contact guard assist Gait Distance (Feet): 200 Feet Assistive device: Rolling walker (2 wheels) Gait Pattern/deviations: Step-through pattern Gait velocity: slow Gait velocity interpretation: <1.8 ft/sec, indicate of risk for recurrent falls   General Gait Details: Pt required cues to keep RW close when ambulating. L knee with slight bend during stance.  Stairs            Wheelchair Mobility     Tilt Bed    Modified Rankin (Stroke Patients Only)       Balance Overall balance assessment: Needs assistance Sitting-balance support: No upper extremity supported, Feet supported Sitting balance-Leahy Scale: Good Sitting balance - Comments: sitting EOB   Standing balance support: Bilateral upper extremity supported Standing balance-Leahy Scale: Fair Standing balance comment: Static standing with assistance from RW                             Pertinent Vitals/Pain Pain Assessment Pain Assessment: 0-10 Pain Score: 3  Pain Location: Back Pain Descriptors / Indicators: Aching, Sore Pain Intervention(s): Monitored during session    Home Living Family/patient expects to be discharged to:: Private residence Living Arrangements: Spouse/significant other;Children Available Help at Discharge: Family;Available 24 hours/day Type of Home: House Home Access: Stairs to enter Entrance  Stairs-Rails: None Entrance Stairs-Number of Steps: 1+1   Home Layout: One level Home Equipment: Crutches;Cane - single point;Cane - Programmer, applications  (2 wheels) Additional Comments: grab bar attached to the side of the tub that she can also use with toilet    Prior Function Prior Level of Function : Independent/Modified Independent             Mobility Comments: Verbal cues needed for use of RW ADLs Comments: reports recently becoming mod I for ADLs     Extremity/Trunk Assessment   Upper Extremity Assessment Upper Extremity Assessment: Overall WFL for tasks assessed    Lower Extremity Assessment Lower Extremity Assessment: Overall WFL for tasks assessed    Cervical / Trunk Assessment Cervical / Trunk Assessment: Back Surgery  Communication   Communication Communication: No apparent difficulties Cueing Techniques: Verbal cues;Tactile cues;Visual cues  Cognition Arousal: Alert Behavior During Therapy: WFL for tasks assessed/performed Overall Cognitive Status: Within Functional Limits for tasks assessed                                          General Comments General comments (skin integrity, edema, etc.): vss    Exercises     Assessment/Plan    PT Assessment Patient needs continued PT services  PT Problem List Decreased strength;Decreased range of motion;Decreased activity tolerance;Decreased balance;Decreased mobility       PT Treatment Interventions Gait training;Functional mobility training;Stair training;Balance training    PT Goals (Current goals can be found in the Care Plan section)  Acute Rehab PT Goals Patient Stated Goal: Discharge home PT Goal Formulation: With patient Time For Goal Achievement: 06/03/23 Potential to Achieve Goals: Good    Frequency Min 1X/week     Co-evaluation               AM-PAC PT "6 Clicks" Mobility  Outcome Measure Help needed turning from your back to your side while in a flat bed without using bedrails?: A Little Help needed moving from lying on your back to sitting on the side of a flat bed without using bedrails?: A Little Help needed  moving to and from a bed to a chair (including a wheelchair)?: A Little Help needed standing up from a chair using your arms (e.g., wheelchair or bedside chair)?: A Little Help needed to walk in hospital room?: A Little Help needed climbing 3-5 steps with a railing? : A Little 6 Click Score: 18    End of Session Equipment Utilized During Treatment: Gait belt;Back brace Activity Tolerance: Patient tolerated treatment well Patient left: in bed;with call bell/phone within reach;with family/visitor present Nurse Communication: Mobility status PT Visit Diagnosis: Other abnormalities of gait and mobility (R26.89);Pain Pain - part of body:  (Lumbar region of back)    Time: 1610-9604 PT Time Calculation (min) (ACUTE ONLY): 26 min   Charges:               Caryl Comes, SPT Acute Rehabilitation Office Phone [phone]   Caryl Comes 05/27/2023, 11:13 AM

## 2023-05-27 NOTE — Progress Notes (Signed)
    Subjective: Procedure(s) (LRB): Lumbar four-five, Lumbar five-Sacral one Decompression (N/A) 1 Day Post-Op  Patient reports pain as 4 on 0-10 scale.  Reports decreased leg pain reports incisional back pain   Positive void Negative bowel movement Negative flatus Negative chest pain or shortness of breath  Objective: Vital signs in last 24 hours: Temp:  [97.6 F (36.4 C)-98.6 F (37 C)] 98.6 F (37 C) (10/25 0353) Pulse Rate:  [90-106] 90 (10/25 0353) Resp:  [11-20] 18 (10/25 0353) BP: (126-167)/(54-84) 129/56 (10/25 0353) SpO2:  [90 %-96 %] 93 % (10/25 0353)  Intake/Output from previous day: 10/24 0701 - 10/25 0700 In: 900 [I.V.:850; IV Piggyback:50] Out: 100 [Blood:100]  Labs: No results for input(s): "WBC", "RBC", "HCT", "PLT" in the last 72 hours. No results for input(s): "NA", "K", "CL", "CO2", "BUN", "CREATININE", "GLUCOSE", "CALCIUM" in the last 72 hours. No results for input(s): "LABPT", "INR" in the last 72 hours.  Physical Exam: Neurologically intact ABD soft Intact pulses distally Incision: dressing C/D/I and no drainage Compartment soft Body mass index is 29.05 kg/m.   Assessment/Plan: Patient stable  xrays n/a Continue mobilization with physical therapy Continue care  Advance diet Plan for discharge tomorrow  Venita Lick, MD [facility] [phone]

## 2023-05-27 NOTE — Evaluation (Signed)
Occupational Therapy Evaluation Patient Details Name: Tracy Mckenzie MRN: [id number] DOB: 1955/10/22 Today's Date: 05/27/2023   History of Present Illness 67 y.o. female who presented with complaints of severe back and bilateral radicular leg pain with a left foot drop.  Imaging demonstrated severe spinal stenosis L4-5 L5-S1 with significant left lateral recess stenosis. She is now s/p L4-5 and L5-S1 decompression. PMH of Cataract, osteoporosis, Leukocytosis, sciatica, GERD.   Clinical Impression   Pt admitted for above, during session her husband present and very supportive. Educated pt on POB precautions, needs reinforcement, she donned her back brace with setup assist but needs slight adjustments. Pt continuing with L bottom pain and struggles to keep L knee fully extended during gait, ambulating with CGA + SPC. Mod A to setup assist needed for ADLs.  Pt would benefit from continued acute skilled OT services to address deficits and help transition to next level of care. No follow-up OT recommended.      If plan is discharge home, recommend the following: A little help with bathing/dressing/bathroom;Assistance with cooking/housework;Assist for transportation    Functional Status Assessment  Patient has had a recent decline in their functional status and demonstrates the ability to make significant improvements in function in a reasonable and predictable amount of time.  Equipment Recommendations  None recommended by OT (pt has rec DME)    Recommendations for Other Services       Precautions / Restrictions Precautions Precautions: Back Precaution Booklet Issued: Yes (comment) Required Braces or Orthoses: Spinal Brace Spinal Brace: Lumbar corset;Applied in sitting position Restrictions Weight Bearing Restrictions: No      Mobility Bed Mobility Overal bed mobility: Needs Assistance Bed Mobility: Supine to Sit     Supine to sit: Used rails, HOB elevated, Supervision      General bed mobility comments: educated pt on log roll, normally her HOB is slightly elevated. Environment replicated to setup home environment. Discussed use of step stool as her bed is higher    Transfers Overall transfer level: Needs assistance Equipment used: 1 person hand held assist Transfers: Sit to/from Stand Sit to Stand: Contact guard assist           General transfer comment: slight L knee flexion while ambulating, cues for upright standing posture as she has a forward lean      Balance Overall balance assessment: Needs assistance Sitting-balance support: No upper extremity supported, Feet supported Sitting balance-Leahy Scale: Good Sitting balance - Comments: sitting EOB   Standing balance support: No upper extremity supported Standing balance-Leahy Scale: Fair Standing balance comment: static stands no AD but ambulating with SPC                           ADL either performed or assessed with clinical judgement   ADL Overall ADL's : Needs assistance/impaired Eating/Feeding: Independent;Sitting   Grooming: Standing;Contact guard assist Grooming Details (indicate cue type and reason): educated pt on compensatory strategies to complete sink ADLS while maintaining back precautions Upper Body Bathing: Independent;Sitting   Lower Body Bathing: Sitting/lateral leans;Minimal assistance   Upper Body Dressing : Sitting;Set up   Lower Body Dressing: Sitting/lateral leans;Moderate assistance;Bed level   Toilet Transfer: Ambulation;Contact guard assist Toilet Transfer Details (indicate cue type and reason): with SPC Toileting- Clothing Manipulation and Hygiene: Contact guard assist;Sit to/from stand       Functional mobility during ADLs: Contact guard assist;Cane       Vision  Perception         Praxis         Pertinent Vitals/Pain Pain Assessment Pain Assessment: 0-10 Pain Score: 3  Pain Location: back; after sitting for a bit but  more when ambulating Pain Descriptors / Indicators: Aching, Sore Pain Intervention(s): Monitored during session, Limited activity within patient's tolerance, Repositioned     Extremity/Trunk Assessment Upper Extremity Assessment Upper Extremity Assessment: Overall WFL for tasks assessed   Lower Extremity Assessment Lower Extremity Assessment: Defer to PT evaluation (tingling in L knee, pain from L buttocks down back of L leg to hamstrings)   Cervical / Trunk Assessment Cervical / Trunk Assessment: Back Surgery   Communication Communication Communication: No apparent difficulties   Cognition Arousal: Alert Behavior During Therapy: WFL for tasks assessed/performed Overall Cognitive Status: Within Functional Limits for tasks assessed                                       General Comments  VSS, compression socks and lumbar corset donned    Exercises     Shoulder Instructions      Home Living Family/patient expects to be discharged to:: Private residence Living Arrangements: Spouse/significant other;Children Available Help at Discharge: Family;Available 24 hours/day Type of Home: House Home Access: Stairs to enter [employer] of Steps: 1+1 Entrance Stairs-Rails: None Home Layout: One level     Bathroom Shower/Tub: Chief Strategy Officer: Handicapped height     Home Equipment: Crutches;Cane - single point;Cane - Programmer, applications (2 wheels)   Additional Comments: grab bar attached to the side of the tub that she can also use with toilet      Prior Functioning/Environment Prior Level of Function : Independent/Modified Independent             Mobility Comments: Mod I with SPC ADLs Comments: reports recently becoming mod I for ADLs        OT Problem List: Pain;Impaired balance (sitting and/or standing)      OT Treatment/Interventions: Self-care/ADL training;Balance training;Therapeutic exercise;Therapeutic  activities;Patient/family education    OT Goals(Current goals can be found in the care plan section) Acute Rehab OT Goals Patient Stated Goal: TO go home OT Goal Formulation: With patient/family Time For Goal Achievement: 06/10/23 Potential to Achieve Goals: Good ADL Goals Pt Will Perform Grooming: standing;with modified independence Pt Will Perform Lower Body Dressing: with set-up;with supervision;bed level Pt Will Transfer to Toilet: ambulating;with modified independence Additional ADL Goal #2: Pt will recall 3/3 POB precautions by the end of the OT session  OT Frequency: Min 1X/week    Co-evaluation              AM-PAC OT "6 Clicks" Daily Activity     Outcome Measure Help from another person eating meals?: None Help from another person taking care of personal grooming?: A Little Help from another person toileting, which includes using toliet, bedpan, or urinal?: A Little Help from another person bathing (including washing, rinsing, drying)?: A Little Help from another person to put on and taking off regular upper body clothing?: A Little Help from another person to put on and taking off regular lower body clothing?: A Lot 6 Click Score: 18   End of Session Equipment Utilized During Treatment: Gait belt;Back brace Nurse Communication: Mobility status  Activity Tolerance: Patient tolerated treatment well Patient left: in bed;with call bell/phone within reach;with family/visitor present  OT Visit  Diagnosis: Unsteadiness on feet (R26.81);Other abnormalities of gait and mobility (R26.89);Pain Pain - part of body:  (back)                Time: 9562-1308 OT Time Calculation (min): 40 min Charges:  OT General Charges $OT Visit: 1 Visit OT Evaluation $OT Eval Moderate Complexity: 1 Mod OT Treatments $Self Care/Home Management : 8-22 mins $Therapeutic Activity: 8-22 mins  05/27/2023  AB, OTR/L  Acute Rehabilitation Services  Office: [phone]   Tristan Schroeder 05/27/2023, 10:13 AM

## 2023-05-27 NOTE — Anesthesia Postprocedure Evaluation (Signed)
Anesthesia Post Note  Patient: Tracy Mckenzie  Procedure(s) Performed: Lumbar four-five, Lumbar five-Sacral one Decompression     Patient location during evaluation: PACU Anesthesia Type: General Level of consciousness: awake and alert Pain management: pain level controlled Vital Signs Assessment: post-procedure vital signs reviewed and stable Respiratory status: spontaneous breathing, nonlabored ventilation, respiratory function stable and patient connected to nasal cannula oxygen Cardiovascular status: blood pressure returned to baseline and stable Postop Assessment: no apparent nausea or vomiting Anesthetic complications: no  No notable events documented.  Last Vitals:  Vitals:   05/27/23 0353 05/27/23 0744  BP: (!) 129/56 (!) 106/57  Pulse: 90 86  Resp: 18 19  Temp: 37 C 36.8 C  SpO2: 93% 90%    Last Pain:  Vitals:   05/27/23 0840  TempSrc:   PainSc: 7    Pain Goal: Patients Stated Pain Goal: 4 (05/27/23 0840)                 Tracy Mckenzie

## 2023-05-28 DIAGNOSIS — M4807 Spinal stenosis, lumbosacral region: Secondary | ICD-10-CM | POA: Diagnosis not present

## 2023-05-28 NOTE — Progress Notes (Signed)
Occupational Therapy Treatment Patient Details Name: Tracy Mckenzie MRN: [id number] DOB: 10/23/1955 Today's Date: 05/28/2023   History of present illness 67 y.o. female who presented with complaints of severe back and bilateral radicular leg pain with a left foot drop.  Imaging demonstrated severe spinal stenosis L4-5 L5-S1 with significant left lateral recess stenosis. She is now s/p L4-5 and L5-S1 decompression. PMH of Cataract, osteoporosis, Leukocytosis, sciatica, GERD.   OT comments  Pt. Seen for skilled OT treatment session with spouse present.  Both able to review back precautions.  Pt. With hx. Of previous surgeries so her and her husband reported good systems in place.  Reviewed log roll tech. And also provided examples and compensatory strategies for maintaining back precautions during peri care.  Pt. And spouse receptive to education.  Both report feeling good for d/c home when able.  All questions answered no other needs verbalized.        If plan is discharge home, recommend the following:  A little help with bathing/dressing/bathroom;Assistance with cooking/housework;Assist for transportation   Equipment Recommendations  None recommended by OT    Recommendations for Other Services      Precautions / Restrictions Precautions Precautions: Back;Fall Precaution Booklet Issued: Yes (comment) Precaution Comments: reviewed precuations during session Required Braces or Orthoses: Spinal Brace Spinal Brace: Lumbar corset;Applied in sitting position Restrictions Weight Bearing Restrictions: No       Mobility Bed Mobility               General bed mobility comments: pt. up in recliner, reviewed steps for log roll tech. with some tips for prevention of twisitng ie: keeping oposite ue reached across the body to prevent it going back and twisting trunk    Transfers                         Balance                                           ADL  either performed or assessed with clinical judgement   ADL Overall ADL's : Needs assistance/impaired       Grooming Details (indicate cue type and reason): pt. reports she has ordered cups to use to prevent breaking "no bending" precautions while performing oral care for rinse and spit portion       Lower Body Bathing Details (indicate cue type and reason): husband present and reports he will assist with LB adls prn Upper Body Dressing : Sitting;Set up Upper Body Dressing Details (indicate cue type and reason): reviewed brace positioning, pt. aware of lifting B breasts over the brace to allow a better fit, states the brace slides up when sitting but aware of how to reposition it as needed   Lower Body Dressing Details (indicate cue type and reason): husband present and reports he will assist with lb adls prn   Toilet Transfer Details (indicate cue type and reason): reports amb to/from with staff and spouse, declines need for physical demo/review today Toileting- Clothing Manipulation and Hygiene: Cueing for compensatory techniques;Cueing for back precautions Toileting - Clothing Manipulation Details (indicate cue type and reason): reviewd options and tech. for pericare following a bm. pt. reports she has some success last night without breaking precautions but sill may look into some adaptive options for thorouhness. reviewed flushable wipes, and tongues also.  Extremity/Trunk Assessment              Vision       Perception     Praxis      Cognition Arousal: Alert Behavior During Therapy: WFL for tasks assessed/performed Overall Cognitive Status: Within Functional Limits for tasks assessed                                          Exercises      Shoulder Instructions       General Comments      Pertinent Vitals/ Pain       Pain Assessment Pain Assessment: No/denies pain  Home Living Family/patient expects to be discharged to::  Private residence Living Arrangements: Spouse/significant other;Children                                      Prior Functioning/Environment              Frequency  Min 1X/week        Progress Toward Goals  OT Goals(current goals can now be found in the care plan section)  Progress towards OT goals: Progressing toward goals     Plan      Co-evaluation                 AM-PAC OT "6 Clicks" Daily Activity     Outcome Measure   Help from another person eating meals?: None Help from another person taking care of personal grooming?: A Little Help from another person toileting, which includes using toliet, bedpan, or urinal?: A Little Help from another person bathing (including washing, rinsing, drying)?: A Little Help from another person to put on and taking off regular upper body clothing?: A Little Help from another person to put on and taking off regular lower body clothing?: A Lot 6 Click Score: 18    End of Session    OT Visit Diagnosis: Unsteadiness on feet (R26.81);Other abnormalities of gait and mobility (R26.89);Pain   Activity Tolerance Patient tolerated treatment well   Patient Left in chair;with call bell/phone within reach;with family/visitor present;Other (comment) (md entering room at end of session)   Nurse Communication          Time: [id number] OT Time Calculation (min): 18 min  Charges: OT General Charges $OT Visit: 1 Visit OT Treatments $Self Care/Home Management : 8-22 mins  Boneta Lucks, COTA/L Acute Rehabilitation [phone]   Alessandra Bevels Lorraine-COTA/L 05/28/2023, 11:22 AM

## 2023-05-28 NOTE — Progress Notes (Signed)
Physical Therapy Treatment Patient Details Name: Tracy Mckenzie MRN: [id number] DOB: 1955/11/23 Today's Date: 05/28/2023   History of Present Illness 67 y.o. female who presented with complaints of severe back and bilateral radicular leg pain with a left foot drop.  Imaging demonstrated severe spinal stenosis L4-5 L5-S1 with significant left lateral recess stenosis. She is now s/p L4-5 and L5-S1 decompression. PMH of Cataract, osteoporosis, Leukocytosis, sciatica, GERD.    PT Comments  Pt progressing well with post-op mobility. She was able to demonstrate transfers and ambulation with gross min assist to CGA and RW for support. Pt was educated on precautions, brace application/wearing schedule, appropriate activity progression, and car transfer. Will continue to follow.      If plan is discharge home, recommend the following: A little help with walking and/or transfers;A little help with bathing/dressing/bathroom;Assistance with cooking/housework;Assist for transportation   Can travel by private vehicle        Equipment Recommendations  Rolling walker (2 wheels)    Recommendations for Other Services       Precautions / Restrictions Precautions Precautions: Back;Fall Precaution Booklet Issued: Yes (comment) Precaution Comments: reviewed precuations during session Required Braces or Orthoses: Spinal Brace Spinal Brace: Lumbar corset;Applied in sitting position Restrictions Weight Bearing Restrictions: No     Mobility  Bed Mobility Overal bed mobility: Needs Assistance Bed Mobility: Rolling, Sidelying to Sit Rolling: Supervision Sidelying to sit: Supervision       General bed mobility comments: Pt demonstrated good log roll technique. Rails down and HOB lowered to 10 to simulate use of wedge at home. No assist required.    Transfers Overall transfer level: Needs assistance Equipment used: Rolling walker (2 wheels) Transfers: Sit to/from Stand Sit to Stand: Contact guard  assist           General transfer comment: VC's for hand placement on seated surface for safety.    Ambulation/Gait Ambulation/Gait assistance: Contact guard assist Gait Distance (Feet): 200 Feet Assistive device: Rolling walker (2 wheels) Gait Pattern/deviations: Step-through pattern Gait velocity: Decreased Gait velocity interpretation: <1.31 ft/sec, indicative of household ambulator   General Gait Details: VC's for improved posture, closer walker proximity and forward gaze. No assist required but close guard provided for safety.   Stairs Stairs: Yes Stairs assistance: Min assist Stair Management: Step to pattern, With cane Number of Stairs: 2 General stair comments: VC's for sequencing and general safety.   Wheelchair Mobility     Tilt Bed    Modified Rankin (Stroke Patients Only)       Balance Overall balance assessment: Needs assistance Sitting-balance support: No upper extremity supported, Feet supported Sitting balance-Leahy Scale: Good Sitting balance - Comments: sitting EOB   Standing balance support: Bilateral upper extremity supported Standing balance-Leahy Scale: Fair Standing balance comment: Static standing with assistance from RW                            Cognition Arousal: Alert Behavior During Therapy: WFL for tasks assessed/performed Overall Cognitive Status: Within Functional Limits for tasks assessed                                          Exercises      General Comments        Pertinent Vitals/Pain Pain Assessment Pain Assessment: Faces Faces Pain Scale: Hurts little more Pain Location: Back Pain Descriptors /  Indicators: Aching, Sore Pain Intervention(s): Limited activity within patient's tolerance, Monitored during session, Repositioned    Home Living                          Prior Function            PT Goals (current goals can now be found in the care plan section) Acute  Rehab PT Goals Patient Stated Goal: Discharge home today PT Goal Formulation: With patient Time For Goal Achievement: 06/03/23 Potential to Achieve Goals: Good Progress towards PT goals: Progressing toward goals    Frequency    Min 5X/week      PT Plan      Co-evaluation              AM-PAC PT "6 Clicks" Mobility   Outcome Measure  Help needed turning from your back to your side while in a flat bed without using bedrails?: A Little Help needed moving from lying on your back to sitting on the side of a flat bed without using bedrails?: A Little Help needed moving to and from a bed to a chair (including a wheelchair)?: A Little Help needed standing up from a chair using your arms (e.g., wheelchair or bedside chair)?: A Little Help needed to walk in hospital room?: A Little Help needed climbing 3-5 steps with a railing? : A Little 6 Click Score: 18    End of Session Equipment Utilized During Treatment: Gait belt;Back brace Activity Tolerance: Patient tolerated treatment well Patient left: in bed;with call bell/phone within reach;with family/visitor present Nurse Communication: Mobility status PT Visit Diagnosis: Other abnormalities of gait and mobility (R26.89);Pain Pain - part of body:  (back)     Time: 1610-9604 PT Time Calculation (min) (ACUTE ONLY): 18 min  Charges:    $Gait Training: 8-22 mins PT General Charges $$ ACUTE PT VISIT: 1 Visit                     Conni Slipper, PT, DPT Acute Rehabilitation Services Secure Chat Preferred Office: [phone]    Marylynn Pearson 05/28/2023, 9:28 AM

## 2023-05-28 NOTE — Progress Notes (Signed)
Patient is discharged from room 3C11 at this time. Alert and in stable condition. IV site d/c'd and instructions read to patient with understanding verbalized and all questions answered. Left unit via wheelchair with all belongings at side. 

## 2023-05-28 NOTE — Care Management (Signed)
Patient with order to DC to home today. Unit staff to provide DME needed for home.   No HH ordered  Patient will have family/ friends provide transportation home. No other TOC needs identified for DC

## 2023-05-28 NOTE — Discharge Summary (Signed)
Patient ID: Tracy Mckenzie MRN: [id number] DOB/AGE: 01/11/1956 68 y.o.  Admit date: 05/26/2023 Discharge date: 05/28/2023  Primary Diagnosis: Spinal stenosis Admission Diagnoses:  Past Medical History:  Diagnosis Date   Anemia    Arthritis    on meds   Bleeding ulcer    Cataract    recently dx-bilaterally   Dyspnea    sob realted to allergies and cough   Elevated cholesterol    on meds   GERD (gastroesophageal reflux disease)    on meds   Headache    Hiatal hernia    High blood pressure    on meds   Kidney cysts    Leukocytosis    idiopathic   Liver cyst    Nerve pain    L sciatic pain radiating to R   Osteoporosis    reaction to Prolia=   Peptic ulcer disease    PONV (postoperative nausea and vomiting)    Seasonal allergies    Discharge Diagnoses:   Principal Problem:   Lumbar spinal stenosis  Estimated body mass index is 29.05 kg/m as calculated from the following:   Height as of this encounter: 5\' 6"  (1.676 m).   Weight as of this encounter: 81.6 kg.  Procedure:  Procedure(s) (LRB): Lumbar four-five, Lumbar five-Sacral one Decompression (N/A)   Consults: None  HPI: Admitted for postoperative care following L4-S1 posterior decompression Laboratory Data: Hospital Outpatient Visit on 05/12/2023  Component Date Value Ref Range Status   MRSA, PCR 05/12/2023 NEGATIVE  NEGATIVE Final   Staphylococcus aureus 05/12/2023 NEGATIVE  NEGATIVE Final   Comment: (NOTE) The Xpert SA Assay (FDA approved for NASAL specimens in patients 56 years of age and older), is one component of a comprehensive surveillance program. It is not intended to diagnose infection nor to guide or monitor treatment. Performed at [facility], [address], [place], [place] [address]    Sodium 05/12/2023 141  135 - 145 mmol/L Final   Potassium 05/12/2023 3.6  3.5 - 5.1 mmol/L Final   Chloride 05/12/2023 105  98 - 111 mmol/L Final   CO2 05/12/2023 25  22 - 32 mmol/L Final    Glucose, Bld 05/12/2023 96  70 - 99 mg/dL Final   Glucose reference range applies only to samples taken after fasting for at least 8 hours.   BUN 05/12/2023 7 (L)  8 - 23 mg/dL Final   Creatinine, Ser 05/12/2023 0.92  0.44 - 1.00 mg/dL Final   Calcium 72/53/6644 9.6  8.9 - 10.3 mg/dL Final   GFR, Estimated 05/12/2023 >60  >60 mL/min Final   Comment: (NOTE) Calculated using the CKD-EPI Creatinine Equation (2021)    Anion gap 05/12/2023 11  5 - 15 Final   Performed at [facility], [address] [place], [place] [address]   WBC 05/12/2023 11.1 (H)  4.0 - 10.5 K/uL Final   RBC 05/12/2023 4.49  3.87 - 5.11 MIL/uL Final   Hemoglobin 05/12/2023 13.0  12.0 - 15.0 g/dL Final   HCT 25/95/6387 40.7  36.0 - 46.0 % Final   MCV 05/12/2023 90.6  80.0 - 100.0 fL Final   MCH 05/12/2023 29.0  26.0 - 34.0 pg Final   MCHC 05/12/2023 31.9  30.0 - 36.0 g/dL Final   RDW 56/43/3295 13.6  11.5 - 15.5 % Final   Platelets 05/12/2023 385  150 - 400 K/uL Final   nRBC 05/12/2023 0.0  0.0 - 0.2 % Final   Performed at [facility], [address] [place], [place] [address]     X-Rays:DG Lumbar Spine 1 View  Result Date: 05/26/2023 CLINICAL DATA:  Elective surgery, intraop localization. EXAM: LUMBAR SPINE - 1 VIEW COMPARISON:  Preoperative imaging. FINDINGS: Portable cross-table lateral view of the lumbar spine obtained in the operating room. Surgical instruments are seen posteriorly at the L4-L5 and S1 levels. IMPRESSION: Surgical instruments posteriorly at the L4-L5 and S1 levels. Electronically Signed   By: Narda Rutherford M.D.   On: 05/26/2023 12:53   DG Lumbar Spine 2-3 Views  Result Date: 05/26/2023 CLINICAL DATA:  Elective surgery. Needle localization requested on film 2 only. EXAM: LUMBAR SPINE - 2-3 VIEW COMPARISON:  Lumbar spine radiographs 09/07/2022, MRI lumbar spine 10/11/2019, CT abdomen pelvis 04/18/2022 FINDINGS: There are two lateral views of the lumbar spine. On the first image, a  needle was directed towards the posterior aspect of the L5 spinous processes and the second needles directed towards the posterior aspect of the S1 posterior elements. On the second image, a single metallic instrument overlies the L4-5 facet joint, pointing towards the L4-5 disc space. Moderate L5-S1, mild L4-5 and L3-4, severe L2-3, and mild T12-L1 disc space narrowing. Moderate severe L1 vertebral body compression fracture, similar to 04/18/2022. IMPRESSION: On the second image, a single metallic instrument overlies the L4-5 facet joint, pointing towards the L4-5 disc space. Critical Value/emergent results were called by telephone at the time of interpretation on 05/26/2023 at 8:44 am to provider DR. DAHARI BROOKS in the operating room, who verbally acknowledged these results. Electronically Signed   By: Neita Garnet M.D.   On: 05/26/2023 08:46    EKG: Orders placed or performed in visit on 12/22/22   EKG 12-Lead     Hospital Course: Tracy Mckenzie is a 67 y.o. who was admitted to Hospital. They were brought to the operating room on 05/26/2023 and underwent Procedure(s): Lumbar four-five, Lumbar five-Sacral one Decompression.  Patient tolerated the procedure well and was later transferred to the recovery room and then to the orthopaedic floor for postoperative care.  They were given PO and IV analgesics for pain control following their surgery.  They were given 24 hours of postoperative antibiotics of  Anti-infectives (From admission, onward)    Start     Dose/Rate Route Frequency Ordered Stop   05/26/23 1600  ceFAZolin (ANCEF) IVPB 1 g/50 mL premix        1 g 100 mL/hr over 30 Minutes Intravenous Every 8 hours 05/26/23 1244 05/27/23 0743   05/26/23 0550  ceFAZolin (ANCEF) IVPB 2g/100 mL premix        2 g 200 mL/hr over 30 Minutes Intravenous 30 min pre-op 05/26/23 0550 05/26/23 0745      and started on DVT prophylaxis in the form of Lovenox.   PT and OT were ordered  By day 2, the patient had  progressed with therapy and meeting their goals.  Incision was healing well.  Patient was seen in rounds and was ready to go home.   Diet: Regular diet Activity:WBAT Follow-up:in 2 weeks Disposition - Home Discharged Condition: good   Discharge Instructions     Call MD / Call 911   Complete by: As directed    If you experience chest pain or shortness of breath, CALL 911 and be transported to the hospital emergency room.  If you develope a fever above 101 F, pus (white drainage) or increased drainage or redness at the wound, or calf pain, call your surgeon's office.  Constipation Prevention   Complete by: As directed    Drink plenty of fluids.  Prune juice may be helpful.  You may use a stool softener, such as Colace (over the counter) 100 mg twice a day.  Use MiraLax (over the counter) for constipation as needed.   Diet - low sodium heart healthy   Complete by: As directed    Incentive spirometry RT   Complete by: As directed    Increase activity slowly as tolerated   Complete by: As directed    Post-operative opioid taper instructions:   Complete by: As directed    POST-OPERATIVE OPIOID TAPER INSTRUCTIONS: It is important to wean off of your opioid medication as soon as possible. If you do not need pain medication after your surgery it is ok to stop day one. Opioids include: Codeine, Hydrocodone(Norco, Vicodin), Oxycodone(Percocet, oxycontin) and hydromorphone amongst others.  Long term and even short term use of opiods can cause: Increased pain response Dependence Constipation Depression Respiratory depression And more.  Withdrawal symptoms can include Flu like symptoms Nausea, vomiting And more Techniques to manage these symptoms Hydrate well Eat regular healthy meals Stay active Use relaxation techniques(deep breathing, meditating, yoga) Do Not substitute Alcohol to help with tapering If you have been on opioids for less than two weeks and do not have pain than it  is ok to stop all together.  Plan to wean off of opioids This plan should start within one week post op of your joint replacement. Maintain the same interval or time between taking each dose and first decrease the dose.  Cut the total daily intake of opioids by one tablet each day Next start to increase the time between doses. The last dose that should be eliminated is the evening dose.         Allergies as of 05/28/2023       Reactions   Demerol Hcl [meperidine] Anaphylaxis, Palpitations   flushed   Hydrocodone-acetaminophen Other (See Comments)   Ibuprofen Other (See Comments)   Stomach ulcers.   Meperidine Hcl Other (See Comments)   Nsaids Other (See Comments)   Peptic ulcers   Scar Care [cream Base]    Redness and heat   Simvastatin     Muscle Pain   Tape Other (See Comments)   "Sometimes removes skin"   Percocet [oxycodone-acetaminophen] Itching, Rash   Prolia [denosumab] Rash   Vicodin [hydrocodone-acetaminophen] Itching   Wound Dressing Adhesive Rash        Medication List     STOP taking these medications    acetaminophen 500 MG tablet Commonly known as: TYLENOL   Systane Complete 0.6 % Soln Generic drug: Propylene Glycol   traMADol 50 MG tablet Commonly known as: ULTRAM       TAKE these medications    albuterol 108 (90 Base) MCG/ACT inhaler Commonly known as: VENTOLIN HFA Inhale 2 puffs into the lungs every 6 (six) hours as needed for wheezing or shortness of breath.   azelastine 0.1 % nasal spray Commonly known as: ASTELIN Place 1 spray into both nostrils 2 (two) times daily as needed for rhinitis or allergies. What changed: when to take this   benzonatate 100 MG capsule Commonly known as: TESSALON TAKE 1 OR 2 CAPSULES BY MOUTH TWO TIMES A DAY AS NEEDED FOR COUGH   gabapentin 100 MG capsule Commonly known as: Neurontin Take 1 capsule (100 mg total) by mouth at bedtime. What changed:  when to take this reasons to take this  gabapentin 300 MG capsule Commonly known as: NEURONTIN Take 1 capsule (300 mg total) by mouth 3 (three) times daily for 15 days. What changed: when to take this   hydrochlorothiazide 12.5 MG capsule Commonly known as: MICROZIDE Take 1 capsule (12.5 mg total) by mouth daily.   hydrOXYzine 10 MG tablet Commonly known as: ATARAX Take 1 tablet (10 mg total) by mouth every 6 (six) hours. What changed:  when to take this reasons to take this   loratadine 10 MG tablet Commonly known as: CLARITIN Take 10 mg by mouth 2 (two) times daily.   losartan 50 MG tablet Commonly known as: COZAAR TAKE 1 TABLET BY MOUTH TWICE A DAY FOR BLOOD PRESSURE   methocarbamol 500 MG tablet Commonly known as: ROBAXIN Take 1 tablet (500 mg total) by mouth every 8 (eight) hours as needed for up to 5 days for muscle spasms.   metoprolol tartrate 25 MG tablet Commonly known as: LOPRESSOR Take 1 tablet (25 mg total) by mouth 2 (two) times daily.   montelukast 10 MG tablet Commonly known as: SINGULAIR TAKE ONE TABLET BY MOUTH AT BEDTIME   nitroGLYCERIN 0.4 MG SL tablet Commonly known as: NITROSTAT Place 1 tablet (0.4 mg total) under the tongue every 5 (five) minutes as needed for chest pain.   omeprazole 40 MG capsule Commonly known as: PRILOSEC Take 1 capsule (40 mg total) by mouth 2 (two) times daily as needed (acid reflux).   ondansetron 4 MG tablet Commonly known as: Zofran Take 1 tablet (4 mg total) by mouth every 8 (eight) hours as needed for nausea or vomiting.   oxyCODONE-acetaminophen 10-325 MG tablet Commonly known as: Percocet Take 1 tablet by mouth every 6 (six) hours as needed for up to 5 days for pain.   Pro Comfort Spacer Adult Misc 1 Device by Does not apply route as needed.        Follow-up Information     Venita Lick, MD. Schedule an appointment as soon as possible for a visit in 2 week(s).   Specialty: Orthopedic Surgery Why: If symptoms worsen, For suture removal,  For wound re-check Contact information: [address] [place] [address] [phone]                 Signed: Maryan Rued, MD Orthopaedic Surgery 05/28/2023, 12:43 PM

## 2023-05-28 NOTE — Progress Notes (Signed)
    Subjective: Procedure(s) (LRB): Lumbar four-five, Lumbar five-Sacral one Decompression (N/A) 2 Days Post-Op  Patient reports pain as 4 on 0-10 scale.  Reports decreased leg pain reports incisional back pain   Positive void Negative bowel movement Negative flatus Negative chest pain or shortness of breath  Objective: Vital signs in last 24 hours: Temp:  [98.3 F (36.8 C)-98.8 F (37.1 C)] 98.8 F (37.1 C) (10/26 0746) Pulse Rate:  [73-97] 97 (10/26 0746) Resp:  [20] 20 (10/26 0746) BP: (97-138)/(42-95) 97/42 (10/26 0746) SpO2:  [92 %-95 %] 95 % (10/26 0746)  Intake/Output from previous day: 10/25 0701 - 10/26 0700 In: 1200 [P.O.:1200] Out: -   Labs: No results for input(s): "WBC", "RBC", "HCT", "PLT" in the last 72 hours. No results for input(s): "NA", "K", "CL", "CO2", "BUN", "CREATININE", "GLUCOSE", "CALCIUM" in the last 72 hours. No results for input(s): "LABPT", "INR" in the last 72 hours.  Physical Exam: Neurologically intact ABD soft Intact pulses distally Incision: dressing C/D/I and no drainage Compartment soft Body mass index is 29.05 kg/m.   Assessment/Plan: Patient stable  xrays n/a Continue mobilization with physical therapy Continue care  Advance diet Plan for discharge today  Duwayne Heck, MD [facility] [phone]

## 2023-05-31 ENCOUNTER — Telehealth: Payer: Self-pay

## 2023-05-31 ENCOUNTER — Other Ambulatory Visit: Payer: Self-pay | Admitting: Physician Assistant

## 2023-05-31 DIAGNOSIS — M5432 Sciatica, left side: Secondary | ICD-10-CM

## 2023-05-31 NOTE — Transitions of Care (Post Inpatient/ED Visit) (Signed)
05/31/2023  Name: Tracy Mckenzie MRN: [id number] DOB: 14-Aug-1955  Today's TOC FU Call Status: Today's TOC FU Call Status:: Unsuccessful Call (1st Attempt) Unsuccessful Call (1st Attempt) Date: 05/31/23  Attempted to reach the patient regarding the most recent Inpatient/ED visit.  Follow Up Plan: Additional outreach attempts will be made to reach the patient to complete the Transitions of Care (Post Inpatient/ED visit) call.   Abby Orlan Aversa, CMA  [facility] AW[facility] Team Direct Dial: [phone]

## 2023-05-31 NOTE — Transitions of Care (Post Inpatient/ED Visit) (Signed)
05/31/2023  Name: Bronte Dorantes MRN: [id number] DOB: Dec 11, 1955  Today's TOC FU Call Status: Today's TOC FU Call Status:: Unsuccessful Call (2nd Attempt) Unsuccessful Call (1st Attempt) Date: 05/31/23 Unsuccessful Call (2nd Attempt) Date: 05/31/23  Attempted to reach the patient regarding the most recent Inpatient/ED visit.  Follow Up Plan: Additional outreach attempts will be made to reach the patient to complete the Transitions of Care (Post Inpatient/ED visit) call.   Abby Lecil Tapp, CMA  [facility] Team Direct Dial: [phone]

## 2023-06-01 ENCOUNTER — Other Ambulatory Visit: Payer: Self-pay | Admitting: Physician Assistant

## 2023-06-13 ENCOUNTER — Ambulatory Visit: Payer: Medicare HMO | Admitting: Internal Medicine

## 2023-06-15 ENCOUNTER — Other Ambulatory Visit: Payer: Self-pay | Admitting: Acute Care

## 2023-06-15 DIAGNOSIS — R053 Chronic cough: Secondary | ICD-10-CM

## 2023-07-06 DIAGNOSIS — M179 Osteoarthritis of knee, unspecified: Secondary | ICD-10-CM | POA: Diagnosis not present

## 2023-07-06 DIAGNOSIS — Z4889 Encounter for other specified surgical aftercare: Secondary | ICD-10-CM | POA: Diagnosis not present

## 2023-07-07 DIAGNOSIS — M25661 Stiffness of right knee, not elsewhere classified: Secondary | ICD-10-CM | POA: Diagnosis not present

## 2023-07-07 DIAGNOSIS — M545 Low back pain, unspecified: Secondary | ICD-10-CM | POA: Diagnosis not present

## 2023-07-08 DIAGNOSIS — H04123 Dry eye syndrome of bilateral lacrimal glands: Secondary | ICD-10-CM | POA: Diagnosis not present

## 2023-07-08 DIAGNOSIS — H40021 Open angle with borderline findings, high risk, right eye: Secondary | ICD-10-CM | POA: Diagnosis not present

## 2023-07-08 DIAGNOSIS — H2513 Age-related nuclear cataract, bilateral: Secondary | ICD-10-CM | POA: Diagnosis not present

## 2023-07-11 ENCOUNTER — Ambulatory Visit: Payer: Medicare HMO | Admitting: Physician Assistant

## 2023-07-11 DIAGNOSIS — M5451 Vertebrogenic low back pain: Secondary | ICD-10-CM | POA: Diagnosis not present

## 2023-07-11 DIAGNOSIS — M25661 Stiffness of right knee, not elsewhere classified: Secondary | ICD-10-CM | POA: Diagnosis not present

## 2023-07-14 DIAGNOSIS — M5451 Vertebrogenic low back pain: Secondary | ICD-10-CM | POA: Diagnosis not present

## 2023-07-14 DIAGNOSIS — M25661 Stiffness of right knee, not elsewhere classified: Secondary | ICD-10-CM | POA: Diagnosis not present

## 2023-07-18 DIAGNOSIS — M25661 Stiffness of right knee, not elsewhere classified: Secondary | ICD-10-CM | POA: Diagnosis not present

## 2023-07-18 DIAGNOSIS — M5451 Vertebrogenic low back pain: Secondary | ICD-10-CM | POA: Diagnosis not present

## 2023-07-19 ENCOUNTER — Other Ambulatory Visit: Payer: Self-pay | Admitting: Physician Assistant

## 2023-07-19 DIAGNOSIS — Z1231 Encounter for screening mammogram for malignant neoplasm of breast: Secondary | ICD-10-CM

## 2023-07-20 ENCOUNTER — Other Ambulatory Visit: Payer: Self-pay | Admitting: Physician Assistant

## 2023-07-20 DIAGNOSIS — M5432 Sciatica, left side: Secondary | ICD-10-CM

## 2023-07-21 DIAGNOSIS — M5451 Vertebrogenic low back pain: Secondary | ICD-10-CM | POA: Diagnosis not present

## 2023-07-21 DIAGNOSIS — M25661 Stiffness of right knee, not elsewhere classified: Secondary | ICD-10-CM | POA: Diagnosis not present

## 2023-07-22 ENCOUNTER — Ambulatory Visit
Admission: RE | Admit: 2023-07-22 | Discharge: 2023-07-22 | Disposition: A | Discharge status: Home / Self Care | Payer: Medicare HMO | Source: Ambulatory Visit | Attending: Physician Assistant | Admitting: Physician Assistant

## 2023-07-22 DIAGNOSIS — Z1231 Encounter for screening mammogram for malignant neoplasm of breast: Secondary | ICD-10-CM

## 2023-07-25 DIAGNOSIS — M5451 Vertebrogenic low back pain: Secondary | ICD-10-CM | POA: Diagnosis not present

## 2023-07-25 DIAGNOSIS — M25661 Stiffness of right knee, not elsewhere classified: Secondary | ICD-10-CM | POA: Diagnosis not present

## 2023-07-29 DIAGNOSIS — M25661 Stiffness of right knee, not elsewhere classified: Secondary | ICD-10-CM | POA: Diagnosis not present

## 2023-07-29 DIAGNOSIS — M5451 Vertebrogenic low back pain: Secondary | ICD-10-CM | POA: Diagnosis not present

## 2023-08-01 DIAGNOSIS — H524 Presbyopia: Secondary | ICD-10-CM | POA: Diagnosis not present

## 2023-08-01 DIAGNOSIS — H52223 Regular astigmatism, bilateral: Secondary | ICD-10-CM | POA: Diagnosis not present

## 2023-08-01 DIAGNOSIS — M5451 Vertebrogenic low back pain: Secondary | ICD-10-CM | POA: Diagnosis not present

## 2023-08-01 DIAGNOSIS — M25661 Stiffness of right knee, not elsewhere classified: Secondary | ICD-10-CM | POA: Diagnosis not present

## 2023-08-08 DIAGNOSIS — M25661 Stiffness of right knee, not elsewhere classified: Secondary | ICD-10-CM | POA: Diagnosis not present

## 2023-08-08 DIAGNOSIS — M5451 Vertebrogenic low back pain: Secondary | ICD-10-CM | POA: Diagnosis not present

## 2023-08-12 DIAGNOSIS — M25661 Stiffness of right knee, not elsewhere classified: Secondary | ICD-10-CM | POA: Diagnosis not present

## 2023-08-12 DIAGNOSIS — M545 Low back pain, unspecified: Secondary | ICD-10-CM | POA: Diagnosis not present

## 2023-08-12 DIAGNOSIS — M5451 Vertebrogenic low back pain: Secondary | ICD-10-CM | POA: Diagnosis not present

## 2023-08-17 ENCOUNTER — Encounter: Payer: Self-pay | Admitting: Physician Assistant

## 2023-08-18 DIAGNOSIS — M5451 Vertebrogenic low back pain: Secondary | ICD-10-CM | POA: Diagnosis not present

## 2023-08-18 DIAGNOSIS — M25661 Stiffness of right knee, not elsewhere classified: Secondary | ICD-10-CM | POA: Diagnosis not present

## 2023-08-18 DIAGNOSIS — M545 Low back pain, unspecified: Secondary | ICD-10-CM | POA: Diagnosis not present

## 2023-08-23 DIAGNOSIS — M25661 Stiffness of right knee, not elsewhere classified: Secondary | ICD-10-CM | POA: Diagnosis not present

## 2023-08-23 DIAGNOSIS — M545 Low back pain, unspecified: Secondary | ICD-10-CM | POA: Diagnosis not present

## 2023-08-23 DIAGNOSIS — M5451 Vertebrogenic low back pain: Secondary | ICD-10-CM | POA: Diagnosis not present

## 2023-08-26 ENCOUNTER — Telehealth (INDEPENDENT_AMBULATORY_CARE_PROVIDER_SITE_OTHER): Payer: Medicare HMO | Admitting: Physician Assistant

## 2023-08-26 DIAGNOSIS — R053 Chronic cough: Secondary | ICD-10-CM | POA: Diagnosis not present

## 2023-08-26 DIAGNOSIS — G4700 Insomnia, unspecified: Secondary | ICD-10-CM | POA: Diagnosis not present

## 2023-08-26 DIAGNOSIS — M5451 Vertebrogenic low back pain: Secondary | ICD-10-CM | POA: Diagnosis not present

## 2023-08-26 DIAGNOSIS — M25661 Stiffness of right knee, not elsewhere classified: Secondary | ICD-10-CM | POA: Diagnosis not present

## 2023-08-26 DIAGNOSIS — M545 Low back pain, unspecified: Secondary | ICD-10-CM | POA: Diagnosis not present

## 2023-08-26 MED ORDER — RAMELTEON 8 MG PO TABS: 8.0000 mg | ORAL_TABLET | Freq: Every evening | ORAL | 2 refills | Status: AC | PRN

## 2023-08-26 MED ORDER — BENZONATATE 100 MG PO CAPS: ORAL_CAPSULE | ORAL | 2 refills | Status: DC

## 2023-08-26 NOTE — Patient Instructions (Signed)
Please work on the following to help with sleep:  -Sleep only long enough to feel rested then get out of bed -Go to bed and get up at the same time every day. -Do not try to force yourself to sleep. If you can't sleep, get out of bed and try again later. -Have coffee, tea, and other foods that have caffeine only in the morning. -Avoid alcohol -Keep your bedroom dark, cool, quiet, and free of reminders of work or other things that cause you stress -Exercise several days a week, but not right before bed -Avoid looking at phones or reading devices ("e-books") that give off light before bed. This can make it harder to fall asleep

## 2023-08-26 NOTE — Progress Notes (Signed)
   Virtual Visit via Video Note  I connected with  Rosely Fernandez  on 08/26/23 at  9:45 AM EST by a video enabled telemedicine application and verified that I am speaking with the correct person using two identifiers.  Location: Patient: home Provider: Nature conservation officer at [employer] Persons present: Patient and myself   I discussed the limitations of evaluation and management by telemedicine and the availability of in person appointments. The patient expressed understanding and agreed to proceed.   History of Present Illness:  Discussed the use of AI scribe software for clinical note transcription with the patient, who gave verbal consent to proceed.  History of Present Illness   The patient, with a history of back surgery, presents with insomnia. They report difficulty sleeping since their back surgery, often staying awake until five or six in the morning. They deny pain as a cause of their insomnia, but note that their mind is unable to 'shut down' enough for them to fall asleep. They have tried various methods to induce sleep, including showers, reading, and watching television, but these have been largely unsuccessful. They have also tried over-the-counter sleep aids, with mixed results. The patient also mentions a seasonal cough that has been worsening.        Observations/Objective:   Gen: Awake, alert, no acute distress Resp: Breathing is even and non-labored Psych: calm/pleasant demeanor Neuro: Alert and Oriented x 3, + facial symmetry, speech is clear.   Assessment and Plan:      Insomnia Difficulty sleeping since back surgery. No significant pain. Difficulty shutting down mind at night. Over-the-counter sleep aids inconsistently effective. -Start Ramelteon 8 mg if insurance approves. If not, recommend over-the-counter 10mg  Melatonin. -Advised on sleep hygiene: avoid screen time in bed, establish wind-down routines, get daylight exposure at noon, avoid  napping during the day.  Chronic Cough Worsening cough, seasonal pattern. -Prescribe Benzonatate as needed for cough control.       Follow Up Instructions:    I discussed the assessment and treatment plan with the patient. The patient was provided an opportunity to ask questions and all were answered. The patient agreed with the plan and demonstrated an understanding of the instructions.   The patient was advised to call back or seek an in-person evaluation if the symptoms worsen or if the condition fails to improve as anticipated.  Kino Dunsworth M Eren Puebla, PA-C

## 2023-09-15 ENCOUNTER — Other Ambulatory Visit: Payer: Self-pay | Admitting: Physician Assistant

## 2023-09-15 DIAGNOSIS — M5432 Sciatica, left side: Secondary | ICD-10-CM

## 2023-09-15 NOTE — Telephone Encounter (Signed)
Last oV: 08/26/23  Next OV: 09/20/23  Last Filled: 07/25/23  Quantity: 45 w/ 2 refills  Pt prescribed 1 tablet TID needing a refill please advise

## 2023-09-20 ENCOUNTER — Ambulatory Visit: Payer: Medicare HMO | Admitting: Physician Assistant

## 2023-10-11 ENCOUNTER — Other Ambulatory Visit: Payer: Self-pay | Admitting: Physician Assistant

## 2023-10-13 ENCOUNTER — Other Ambulatory Visit: Payer: Self-pay | Admitting: Acute Care

## 2023-10-13 ENCOUNTER — Other Ambulatory Visit: Payer: Self-pay | Admitting: Physician Assistant

## 2023-10-13 DIAGNOSIS — R053 Chronic cough: Secondary | ICD-10-CM

## 2023-11-07 ENCOUNTER — Ambulatory Visit (INDEPENDENT_AMBULATORY_CARE_PROVIDER_SITE_OTHER): Payer: Medicare HMO

## 2023-11-07 VITALS — Ht 66.0 in | Wt 180.0 lb

## 2023-11-07 DIAGNOSIS — Z0000 Encounter for general adult medical examination without abnormal findings: Secondary | ICD-10-CM

## 2023-11-07 NOTE — Patient Instructions (Signed)
 [name] , Thank you for taking time to come for your Medicare Wellness Visit. I appreciate your ongoing commitment to your health goals. Please review the following plan we discussed and let me know if I can assist you in the future.   Referrals/Orders/Follow-Ups/Clinician Recommendations: Continue to go to walking water exercise and advance to other equipment   This is a list of the screening recommended for you and due dates:  Health Maintenance  Topic Date Due   Zoster (Shingles) Vaccine (1 of 2) 01/07/1973   Medicare Annual Wellness Visit  11/02/2023   Flu Shot  03/02/2024   DTaP/Tdap/Td vaccine (2 - Td or Tdap) 05/02/2024   Mammogram  07/21/2024   Colon Cancer Screening  06/18/2027   Pneumonia Vaccine  Completed   DEXA scan (bone density measurement)  Completed   Hepatitis C Screening  Completed   HPV Vaccine  Aged Out   COVID-19 Vaccine  Discontinued    Advanced directives: (Copy Requested) Please bring a copy of your health care power of attorney and living will to the office to be added to your chart at your convenience. You can mail to [facility] [address] [place], [place] [address] or email to [email or web address]  Next Medicare Annual Wellness Visit scheduled for next year: Yes

## 2023-11-07 NOTE — Progress Notes (Signed)
 Subjective:   Tracy Mckenzie is a 68 y.o. who presents for a Medicare Wellness preventive visit.  Visit Complete: Virtual I connected with  Tracy Mckenzie on 11/07/23 by a audio enabled telemedicine application and verified that I am speaking with the correct person using two identifiers.  Patient Location: Home  Provider Location: Office/Clinic  I discussed the limitations of evaluation and management by telemedicine. The patient expressed understanding and agreed to proceed.  Vital Signs: Because this visit was a virtual/telehealth visit, some criteria may be missing or patient reported. Any vitals not documented were not able to be obtained and vitals that have been documented are patient reported.  VideoDeclined- This patient declined Librarian, academic. Therefore the visit was completed with audio only.  Persons Participating in Visit: Patient.  AWV Questionnaire: No: Patient Medicare AWV questionnaire was not completed prior to this visit.  Cardiac Risk Factors include: advanced age (>25men, >48 women);dyslipidemia;hypertension     Objective:    Today's Vitals   11/07/23 1055  Weight: 180 lb (81.6 kg)  Height: 5\' 6"  (1.676 m)  PainSc: 5    Body mass index is 29.05 kg/m.     05/28/2023    8:00 AM 05/12/2023    9:14 AM 02/14/2023    2:41 PM 02/07/2023   10:54 AM 11/02/2022   11:39 AM 06/23/2022   11:31 AM 04/19/2022    4:48 PM  Advanced Directives  Does Patient Have a Medical Advance Directive? Yes Yes No Yes Yes No No  Type of Estate agent of [place];Living will Healthcare Power of [place];Living will Healthcare Power of [place];Living will Living will;Healthcare Power of [employer] Power of [place];Living will    Does patient want to make changes to medical advance directive? No - Patient declined No - Patient declined       Copy of Healthcare Power of Attorney in Chart? Yes - validated most recent copy  scanned in chart (See row information) No - copy requested No - copy requested  No - copy requested    Would patient like information on creating a medical advance directive?   No - Patient declined   No - Patient declined     Current Medications (verified) Outpatient Encounter Medications as of 11/07/2023  Medication Sig   albuterol (VENTOLIN HFA) 108 (90 Base) MCG/ACT inhaler INHALE 2 PUFFS BY MOUTH EVERY 6 HOURS AS NEEDED FOR WHEEZING OR SHORTNESS OF BREATH   azelastine (ASTELIN) 0.1 % nasal spray Place 1 spray into both nostrils 2 (two) times daily as needed for rhinitis or allergies. (Patient taking differently: Place 1 spray into both nostrils 2 (two) times daily.)   benzonatate (TESSALON) 100 MG capsule TAKE 1 OR 2 CAPSULES BY MOUTH TWO TIMES A DAY AS NEEDED FOR COUGH   gabapentin (NEURONTIN) 100 MG capsule TAKE 1 CAPSULE BY MOUTH AT BEDTIME   gabapentin (NEURONTIN) 300 MG capsule TAKE 1 CAPSULE BY MOUTH 3 TIMES A DAY   hydrochlorothiazide (MICROZIDE) 12.5 MG capsule Take 1 capsule (12.5 mg total) by mouth daily.   hydrOXYzine (ATARAX) 10 MG tablet TAKE 1 TABLET BY MOUTH EVERY 6 HOURS   loratadine (CLARITIN) 10 MG tablet Take 10 mg by mouth 2 (two) times daily.   losartan (COZAAR) 50 MG tablet TAKE 1 TABLET BY MOUTH 2 TIMES A DAY FOR BLOOD PRESSURE   methocarbamol (ROBAXIN) 500 MG tablet Take 500 mg by mouth 4 (four) times daily.   metoprolol tartrate (LOPRESSOR) 25 MG tablet TAKE 1  TABLET BY MOUTH 2 TIMES A DAY   montelukast (SINGULAIR) 10 MG tablet TAKE 1 TABLET BY MOUTH AT BEDTIME   nitroGLYCERIN (NITROSTAT) 0.4 MG SL tablet Place 1 tablet (0.4 mg total) under the tongue every 5 (five) minutes as needed for chest pain.   omeprazole (PRILOSEC) 40 MG capsule Take 1 capsule (40 mg total) by mouth 2 (two) times daily as needed (acid reflux).   ondansetron (ZOFRAN) 4 MG tablet Take 1 tablet (4 mg total) by mouth every 8 (eight) hours as needed for nausea or vomiting.   ramelteon (ROZEREM) 8  MG tablet Take 1 tablet (8 mg total) by mouth at bedtime as needed for sleep.   Spacer/Aero-Holding Chambers (PRO COMFORT SPACER ADULT) MISC 1 Device by Does not apply route as needed.   traMADol (ULTRAM) 50 MG tablet 1 tabs po q6 hours prn moderate pain   No facility-administered encounter medications on file as of 11/07/2023.    Allergies (verified) Demerol hcl [meperidine], Hydrocodone-acetaminophen, Ibuprofen, Nsaids, Scar care [cream base], Simvastatin, Tape, Meperidine hcl, Percocet [oxycodone-acetaminophen], Prolia [denosumab], Vicodin [hydrocodone-acetaminophen], and Wound dressing adhesive   History: Past Medical History:  Diagnosis Date   Anemia    Arthritis    on meds   Bleeding ulcer    Cataract    recently dx-bilaterally   Dyspnea    sob realted to allergies and cough   Elevated cholesterol    on meds   GERD (gastroesophageal reflux disease)    on meds   Headache    Hiatal hernia    High blood pressure    on meds   Kidney cysts    Leukocytosis    idiopathic   Liver cyst    Nerve pain    L sciatic pain radiating to R   Osteoporosis    reaction to Prolia=   Peptic ulcer disease    PONV (postoperative nausea and vomiting)    Seasonal allergies    Past Surgical History:  Procedure Laterality Date   APPENDECTOMY  1992   BREAST CYST ASPIRATION Left 2003   COLONOSCOPY  2014   [facility]   KNEE ARTHROSCOPY Right 04/12/2001   torn cartilage   LAPAROSCOPY  1981   for endometriosis   LEG SURGERY Right 11/23/2000   to remove screws   LEG SURGERY Right 02/08/2002   to remove rod   LUMBAR LAMINECTOMY/DECOMPRESSION MICRODISCECTOMY N/A 05/26/2023   Procedure: Lumbar four-five, Lumbar five-Sacral one Decompression;  Surgeon: Venita Lick, MD;  [facility];  Service: Orthopedics;  Laterality: N/A;  3 C-Bed   MENISCUS REPAIR  01/29/2003   TIBIA FRACTURE SURGERY Right 06/12/2000   Multiple surgeries   TONSILLECTOMY  1967   TOTAL ABDOMINAL  HYSTERECTOMY  1992   TOTAL KNEE ARTHROPLASTY Right 02/14/2023   Procedure: TOTAL KNEE ARTHROPLASTY;  Surgeon: Ollen Gross, MD;  Location: [facility] ORS;  Service: Orthopedics;  Laterality: Right;   WISDOM TOOTH EXTRACTION     Family History  Problem Relation Age of Onset   Kidney disease Mother    COPD Mother    COPD Father    Breast cancer Sister 26   Breast cancer Maternal Aunt    Breast cancer Paternal Aunt    Diabetes Maternal Grandfather    Stroke Paternal Grandfather    Colon cancer Neg Hx    Colon polyps Neg Hx    Esophageal cancer Neg Hx    Rectal cancer Neg Hx    Stomach cancer Neg Hx    Social History  Socioeconomic History   Marital status: Married    Spouse name: Not on file   Number of children: Not on file   Years of education: Not on file   Highest education level: Master's degree (e.g., MA, MS, MEng, MEd, MSW, MBA)  Occupational History   Not on file  Tobacco Use   Smoking status: Never   Smokeless tobacco: Never  Vaping Use   Vaping status: Never Used  Substance and Sexual Activity   Alcohol use: No   Drug use: No   Sexual activity: Not on file  Other Topics Concern   Not on file  Social History Narrative   Not on file   Social Drivers of Health   Financial Resource Strain: Low Risk  (11/07/2023)   Overall Financial Resource Strain (CARDIA)    Difficulty of Paying Living Expenses: Not hard at all  Food Insecurity: No Food Insecurity (11/07/2023)   Hunger Vital Sign    Worried About Running Out of Food in the Last Year: Never true    Ran Out of Food in the Last Year: Never true  Transportation Needs: No Transportation Needs (11/07/2023)   PRAPARE - Administrator, Civil Service (Medical): No    Lack of Transportation (Non-Medical): No  Physical Activity: Insufficiently Active (11/07/2023)   Exercise Vital Sign    Days of Exercise per Week: 2 days    Minutes of Exercise per Session: 50 min  Stress: Stress Concern Present (11/07/2023)    [employer] of Occupational Health - Occupational Stress Questionnaire    Feeling of Stress : To some extent  Social Connections: Moderately Integrated (11/07/2023)   Social Connection and Isolation Panel [NHANES]    Frequency of Communication with Friends and Family: More than three times a week    Frequency of Social Gatherings with Friends and Family: Once a week    Attends Religious Services: 1 to 4 times per year    Active Member of [employer] or Organizations: No    Attends Engineer, structural: Never    Marital Status: Married    Tobacco Counseling Counseling given: Not Answered    Clinical Intake:  Pre-visit preparation completed: Yes  Pain : 0-10 Pain Score: 5  Pain Type: Chronic pain Pain Location: Other (Comment) (spine and down left  leg) Pain Orientation: Left Pain Descriptors / Indicators: Other (Comment) (nerve pain)     BMI - recorded: 29.05 Nutritional Status: BMI 25 -29 Overweight Diabetes: No  Lab Results  Component Value Date   HGBA1C 5.8 01/03/2023   HGBA1C 5.6 07/02/2021     How often do you need to have someone help you when you read instructions, pamphlets, or other written materials from your doctor or pharmacy?: 1 - Never  Interpreter Needed?: No  Information entered by :: Lanier Ensign, LPN   Activities of Daily Living     11/07/2023   10:59 AM 05/28/2023    8:00 AM  In your present state of health, do you have any difficulty performing the following activities:  Hearing? 0 0  Vision? 0 0  Difficulty concentrating or making decisions? 0 0  Walking or climbing stairs? 0   Dressing or bathing? 0   Doing errands, shopping? 0 0  Preparing Food and eating ? N   Using the Toilet? N   In the past six months, have you accidently leaked urine? Y   Comment wears a pad   Do you have problems with loss of bowel control?  N   Managing your Medications? N   Managing your Finances? N   Housekeeping or managing your Housekeeping? N      Patient Care Team: Allwardt, Crist Infante, PA-C as PCP - General (Physician Assistant) Pricilla Riffle, MD as PCP - Cardiology (Cardiology)  Indicate any recent Medical Services you may have received from other than [facility] providers in the past year (date may be approximate).     Assessment:   This is a routine wellness examination for Tracy Mckenzie.  Hearing/Vision screen Hearing Screening - Comments:: Pt denies any hearing issues  Vision Screening - Comments:: Wears rx glasses - up to date with routine eye exams with Dr Norva Pavlov     Goals Addressed             This Visit's Progress    Patient Stated       Patient Stated       Continue with walking water classes and advance        Depression Screen      11/07/2023   11:03 AM 05/17/2023   10:08 AM 04/20/2023    8:31 AM 11/02/2022   11:36 AM 08/12/2022    8:07 AM 10/30/2021   11:12 AM 05/20/2021    9:46 AM  PHQ 2/9 Scores  PHQ - 2 Score 0 1 0 0 0 0 0  PHQ- 9 Score  9         Fall Risk      11/07/2023   11:08 AM 05/17/2023   10:07 AM 04/20/2023    8:31 AM 11/02/2022   11:39 AM 08/12/2022    8:07 AM  Fall Risk   Falls in the past year? 0 0 0 0 0  Number falls in past yr: 0 0 0 0 0  Injury with Fall? 0 0 0 0 0  Risk for fall due to : No Fall Risks No Fall Risks No Fall Risks Impaired vision;Impaired mobility;Impaired balance/gait No Fall Risks  Follow up Falls prevention discussed Falls evaluation completed Falls evaluation completed Falls prevention discussed Falls evaluation completed    MEDICARE RISK AT HOME:   Medicare Risk at Home Any stairs in or around the home?: Yes If so, are there any without handrails?: Yes Home free of loose throw rugs in walkways, pet beds, electrical cords, etc?: Yes Adequate lighting in your home to reduce risk of falls?: Yes Life alert?: No Use of a cane, walker or w/c?: No Grab bars in the bathroom?: No Shower chair or bench in shower?: Yes Elevated toilet seat or a handicapped  toilet?: No  TIMED UP AND GO:  Was the test performed?  No  Cognitive Function: 6CIT completed        11/07/2023   11:09 AM 11/02/2022   11:40 AM 10/30/2021   11:15 AM 10/24/2020   11:16 AM  6CIT Screen  What Year? 0 points 0 points 0 points 0 points  What month? 0 points 0 points 0 points 0 points  What time? 0 points 0 points 0 points   Count back from 20 0 points 0 points 0 points 0 points  Months in reverse 0 points 0 points 0 points 0 points  Repeat phrase 0 points 0 points 0 points 0 points  Total Score 0 points 0 points 0 points     Immunizations Immunization History  Administered Date(s) Administered   Fluad Quad(high Dose 65+) 05/20/2021, 06/28/2022   Influenza, High Dose Seasonal PF 05/10/2019, 05/05/2023   Influenza-Unspecified 05/30/2015,  06/30/2020, 06/28/2022, 05/05/2023   Moderna Sars-Covid-2 Vaccination 09/15/2019, 10/13/2019, 06/30/2020   PNEUMOCOCCAL CONJUGATE-20 02/05/2022   Pfizer(Comirnaty)Fall Seasonal Vaccine 12 years and older 06/30/2020   Pneumococcal Polysaccharide-23 07/11/2019   RSV,unspecified 06/28/2022   Respiratory Syncytial Virus Vaccine,Recomb Aduvanted(Arexvy) 06/28/2022   Tdap 05/02/2014   Zoster, Live 05/31/2015, 06/06/2015    Screening Tests Health Maintenance  Topic Date Due   Zoster Vaccines- Shingrix (1 of 2) 01/07/1973   INFLUENZA VACCINE  03/02/2024   DTaP/Tdap/Td (2 - Td or Tdap) 05/02/2024   MAMMOGRAM  07/21/2024   Medicare Annual Wellness (AWV)  11/06/2024   Colonoscopy  06/18/2027   Pneumonia Vaccine 2+ Years old  Completed   DEXA SCAN  Completed   Hepatitis C Screening  Completed   HPV VACCINES  Aged Out   COVID-19 Vaccine  Discontinued    Health Maintenance  Health Maintenance Due  Topic Date Due   Zoster Vaccines- Shingrix (1 of 2) 01/07/1973   Health Maintenance Items Addressed: See Nurse Notes  Additional Screening:  Vision Screening: Recommended annual ophthalmology exams for early detection of  glaucoma and other disorders of the eye.  Dental Screening: Recommended annual dental exams for proper oral hygiene  Community Resource Referral / Chronic Care Management: CRR required this visit?  No   CCM required this visit?  No     Plan:     I have personally reviewed and noted the following in the patient's chart:   Medical and social history Use of alcohol, tobacco or illicit drugs  Current medications and supplements including opioid prescriptions. Patient is currently taking opioid prescriptions. Information provided to patient regarding non-opioid alternatives. Patient advised to discuss non-opioid treatment plan with their provider. Functional ability and status Nutritional status Physical activity Advanced directives List of other physicians Hospitalizations, surgeries, and ER visits in previous 12 months Vitals Screenings to include cognitive, depression, and falls Referrals and appointments  In addition, I have reviewed and discussed with patient certain preventive protocols, quality metrics, and best practice recommendations. A written personalized care plan for preventive services as well as general preventive health recommendations were provided to patient.     Marzella Schlein, LPN   0/04/8118   After Visit Summary: (MyChart) Due to this being a telephonic visit, the after visit summary with patients personalized plan was offered to patient via MyChart   Notes: Nothing significant to report at this time.

## 2023-11-08 DIAGNOSIS — Z4889 Encounter for other specified surgical aftercare: Secondary | ICD-10-CM | POA: Diagnosis not present

## 2023-11-08 DIAGNOSIS — M5459 Other low back pain: Secondary | ICD-10-CM | POA: Diagnosis not present

## 2023-11-08 DIAGNOSIS — R52 Pain, unspecified: Secondary | ICD-10-CM | POA: Insufficient documentation

## 2023-11-10 ENCOUNTER — Other Ambulatory Visit: Payer: Self-pay | Admitting: Physician Assistant

## 2023-11-10 DIAGNOSIS — K219 Gastro-esophageal reflux disease without esophagitis: Secondary | ICD-10-CM

## 2023-11-11 ENCOUNTER — Other Ambulatory Visit: Payer: Self-pay | Admitting: Physician Assistant

## 2023-11-11 DIAGNOSIS — K219 Gastro-esophageal reflux disease without esophagitis: Secondary | ICD-10-CM

## 2023-11-11 DIAGNOSIS — M47816 Spondylosis without myelopathy or radiculopathy, lumbar region: Secondary | ICD-10-CM | POA: Diagnosis not present

## 2023-11-11 DIAGNOSIS — M5416 Radiculopathy, lumbar region: Secondary | ICD-10-CM | POA: Insufficient documentation

## 2023-11-15 ENCOUNTER — Other Ambulatory Visit: Payer: Self-pay | Admitting: Physician Assistant

## 2023-11-15 NOTE — Telephone Encounter (Signed)
 LO visit 05/17/2023 Last refill on 06/08/2023 by historical provider

## 2023-11-24 DIAGNOSIS — M5459 Other low back pain: Secondary | ICD-10-CM | POA: Diagnosis not present

## 2023-11-30 DIAGNOSIS — M5451 Vertebrogenic low back pain: Secondary | ICD-10-CM | POA: Diagnosis not present

## 2023-11-30 DIAGNOSIS — M4150 Other secondary scoliosis, site unspecified: Secondary | ICD-10-CM | POA: Diagnosis not present

## 2023-12-08 ENCOUNTER — Other Ambulatory Visit: Payer: Self-pay | Admitting: Physician Assistant

## 2023-12-09 DIAGNOSIS — M5416 Radiculopathy, lumbar region: Secondary | ICD-10-CM | POA: Diagnosis not present

## 2023-12-09 DIAGNOSIS — M47816 Spondylosis without myelopathy or radiculopathy, lumbar region: Secondary | ICD-10-CM | POA: Diagnosis not present

## 2024-01-06 DIAGNOSIS — H2513 Age-related nuclear cataract, bilateral: Secondary | ICD-10-CM | POA: Diagnosis not present

## 2024-01-06 DIAGNOSIS — H40021 Open angle with borderline findings, high risk, right eye: Secondary | ICD-10-CM | POA: Diagnosis not present

## 2024-01-10 DIAGNOSIS — M47896 Other spondylosis, lumbar region: Secondary | ICD-10-CM | POA: Diagnosis not present

## 2024-01-10 DIAGNOSIS — M5416 Radiculopathy, lumbar region: Secondary | ICD-10-CM | POA: Diagnosis not present

## 2024-01-10 DIAGNOSIS — Z4889 Encounter for other specified surgical aftercare: Secondary | ICD-10-CM | POA: Diagnosis not present

## 2024-01-12 DIAGNOSIS — M5416 Radiculopathy, lumbar region: Secondary | ICD-10-CM | POA: Diagnosis not present

## 2024-01-24 DIAGNOSIS — M4150 Other secondary scoliosis, site unspecified: Secondary | ICD-10-CM | POA: Insufficient documentation

## 2024-01-24 DIAGNOSIS — M545 Low back pain, unspecified: Secondary | ICD-10-CM | POA: Diagnosis not present

## 2024-01-31 ENCOUNTER — Telehealth: Payer: Self-pay

## 2024-01-31 DIAGNOSIS — Z4889 Encounter for other specified surgical aftercare: Secondary | ICD-10-CM | POA: Diagnosis not present

## 2024-01-31 DIAGNOSIS — M5416 Radiculopathy, lumbar region: Secondary | ICD-10-CM | POA: Diagnosis not present

## 2024-01-31 DIAGNOSIS — M47816 Spondylosis without myelopathy or radiculopathy, lumbar region: Secondary | ICD-10-CM | POA: Diagnosis not present

## 2024-01-31 NOTE — Telephone Encounter (Signed)
 Received incoming call from Isabella at [facility] pharmacy. They need verification that patient is to be taking both Gabapentin  100MG  and Lyrica . Lauraine can you please advise if patient should be taking both medications. Thank you

## 2024-02-01 DIAGNOSIS — M5416 Radiculopathy, lumbar region: Secondary | ICD-10-CM | POA: Diagnosis not present

## 2024-02-01 NOTE — Telephone Encounter (Signed)
 Looking at chart Gabapentin  100mg  was written by you back in 01/07/2023 at [facility], Refill was sent on 10/13/2023 with 9 rf. Lyrica  was written by [facility] after speaking with pharmacy I advised them to check with provider from [facility] to make sure they are aware pt is taking Gabapentin  also and have them advise as they may not know she is taking this when they wrote rx for Lyrica 

## 2024-02-15 ENCOUNTER — Other Ambulatory Visit: Payer: Self-pay | Admitting: Acute Care

## 2024-02-15 DIAGNOSIS — R053 Chronic cough: Secondary | ICD-10-CM

## 2024-02-16 DIAGNOSIS — M5416 Radiculopathy, lumbar region: Secondary | ICD-10-CM | POA: Diagnosis not present

## 2024-02-16 DIAGNOSIS — M47816 Spondylosis without myelopathy or radiculopathy, lumbar region: Secondary | ICD-10-CM | POA: Diagnosis not present

## 2024-02-16 DIAGNOSIS — Z4889 Encounter for other specified surgical aftercare: Secondary | ICD-10-CM | POA: Diagnosis not present

## 2024-02-28 DIAGNOSIS — M4150 Other secondary scoliosis, site unspecified: Secondary | ICD-10-CM | POA: Diagnosis not present

## 2024-02-28 DIAGNOSIS — M545 Low back pain, unspecified: Secondary | ICD-10-CM | POA: Diagnosis not present

## 2024-02-28 DIAGNOSIS — M5416 Radiculopathy, lumbar region: Secondary | ICD-10-CM | POA: Diagnosis not present

## 2024-03-08 ENCOUNTER — Ambulatory Visit
Admission: EM | Admit: 2024-03-08 | Discharge: 2024-03-08 | Disposition: A | Discharge status: Home / Self Care | Attending: Family Medicine | Admitting: Family Medicine

## 2024-03-08 ENCOUNTER — Ambulatory Visit: Payer: Self-pay | Admitting: *Deleted

## 2024-03-08 ENCOUNTER — Encounter: Payer: Self-pay | Admitting: Physician Assistant

## 2024-03-08 DIAGNOSIS — R2242 Localized swelling, mass and lump, left lower limb: Secondary | ICD-10-CM

## 2024-03-08 NOTE — Telephone Encounter (Signed)
 Reason for Disposition  MODERATE ankle swelling (e.g., interferes with normal activities, can't move joint normally) (Exceptions: Itchy, localized swelling; swelling is chronic.)  Answer Assessment - Initial Assessment Questions 1. LOCATION: Which ankle is swollen? Where is the swelling?     I'm having swelling in my left ankle and foot.   Last Friday we went to [place] and back.   I take hydrochlorothiazide .    2. ONSET: When did the swelling start?     I had swelling in my ankles.  Elevation is not helping.   The left one is getting more puffy.  I'm having tingling and numbness in it  my left foot.   It's moving up my leg.  But the back issue the numbness goes down my leg.    I have numbness/tingling from my lower back which I'm having issues with.   I'm having another back surgery soon.   3. SWELLING: How bad is the swelling? Or, How large is it? (e.g., mild, moderate, severe; size of localized swelling)      The left ankle and foot are more swollen.   In the mornings it's more puffy than the night before.   4. PAIN: Is there any pain? If Yes, ask: How bad is it? (Scale 0-10; or none, mild, moderate, severe)     No pain 5. CAUSE: What do you think caused the ankle swelling?     I don't know 6. OTHER SYMPTOMS: Do you have any other symptoms? (e.g., fever, chest pain, difficulty breathing, calf pain)     See above 7. PREGNANCY: Is there any chance you are pregnant? When was your last menstrual period?     N/A  Protocols used: Ankle Swelling-A-AH Pt was warm transferred to me by [facility] due to her MyChart message about ankle swelling.    Is it possible she can be worked in for 03/09/2024?    I put her on the wait list in case of a cancellation but feel she should be seen before the weekend.         FYI Only or Action Required?: FYI only for provider.  Patient was last seen in primary care on 11/26/2021 by Lucius Krabbe, NP.  Called Nurse Triage  reporting Joint Swelling.Having swelling in both ankles but especially in the left foot and ankle with numbness/tingling going up her leg.   She has lower back issues with numbness/tingling that usually goes down her leg from the top but this is going up her leg from the ankle.   Denies shortness of breath or chest tightness.  BP a little elevated too 144/74.  Instructed to go to the ED or urgent care over the weekend if her symptoms became worse.   She was agreeable to this.  Message sent to see if she can be worked in for 03/09/2024.    Symptoms began a week ago. Last Friday.  Interventions attempted: Rest, hydration, or home remedies. Elevation not helping  Symptoms are: gradually worsening.Left foot and ankle are more swollen and more puffy in the mornings.   Elevation not helping.  Triage Disposition: See PCP When Office is Open (Within 3 Days)  Patient/caregiver understands and will follow disposition?: Yes

## 2024-03-08 NOTE — ED Triage Notes (Signed)
 Pt c/o swelling and numbness/tingling to left foot and ankle since 8/1 after car travel-states she is wearing compression socks-NAD-steady gait

## 2024-03-08 NOTE — Telephone Encounter (Signed)
 See Triage notes for patient and advise working into schedule for tomorrow 03/09/24.

## 2024-03-08 NOTE — Discharge Instructions (Addendum)
 Please call first thing tomorrow at [phone] or [phone]. This is important to schedule your ultrasound as soon as possible to rule out a dvt.   For now make sure that you wear compression socks between 54mmHg-20mmHg.  Avoid excess salt and sodium in your diet by not ordering any takeout, fast food, restaurant food.  You should also avoid processed meals from the grocery store, canned goods as these carry an excessive amount of salt and sodium, can perpetuate lower leg swelling.

## 2024-03-08 NOTE — ED Provider Notes (Signed)
 [facility]  Note:  This document was prepared using Conservation officer, historic buildings and may include unintentional dictation errors.  MRN: [id number] DOB: 1956-05-24  Subjective:   Tracy Mckenzie is a 68 y.o. female presenting for 1 week history of persistent left lower leg, foot and ankle swelling with associated numbness/tingling that can radiate into distal left lower leg. Symptoms started after she went on a 10 hour round trip by car.  Has previously had lower leg swelling but resolves with the use of compression socks.  The right has improved.  But the left is persisting.  She does have a history of lumbar spinal stenosis but has no radicular symptoms at this time.  Her standard maintenance medications for that are not helping either way.  She also takes hydrochlorothiazide  and losartan  for her blood pressure.  No chest pain, hemoptysis, heart racing, palpitations, shortness of breath.  She is very compliant with her regimen.  No history of clotting disorders.  No history of DVT.  No current facility-administered medications for this encounter.  Current Outpatient Medications:    albuterol  (VENTOLIN  HFA) 108 (90 Base) MCG/ACT inhaler, INHALE 2 PUFFS BY MOUTH EVERY 6 HOURS AS NEEDED FOR WHEEZING OR SHORTNESS OF BREATH, Disp: 18 g, Rfl: 1   azelastine  (ASTELIN ) 0.1 % nasal spray, Place 1 spray into both nostrils 2 (two) times daily as needed for rhinitis or allergies. (Patient taking differently: Place 1 spray into both nostrils 2 (two) times daily.), Disp: 30 mL, Rfl: 5   benzonatate  (TESSALON ) 100 MG capsule, TAKE 1 OR 2 CAPSULES BY MOUTH TWO TIMES A DAY AS NEEDED FOR COUGH, Disp: 30 capsule, Rfl: 2   gabapentin  (NEURONTIN ) 100 MG capsule, TAKE 1 CAPSULE BY MOUTH AT BEDTIME, Disp: 30 capsule, Rfl: 9   gabapentin  (NEURONTIN ) 300 MG capsule, TAKE 1 CAPSULE BY MOUTH 3 TIMES A DAY, Disp: 45 capsule, Rfl: 2   hydrochlorothiazide  (MICROZIDE ) 12.5 MG capsule, TAKE 1 CAPSULE  BY MOUTH DAILY, Disp: 90 capsule, Rfl: 3   hydrOXYzine  (ATARAX ) 10 MG tablet, TAKE 1 TABLET BY MOUTH EVERY 6 HOURS, Disp: 60 tablet, Rfl: 2   loratadine  (CLARITIN ) 10 MG tablet, Take 10 mg by mouth 2 (two) times daily., Disp: , Rfl:    losartan  (COZAAR ) 50 MG tablet, TAKE 1 TABLET BY MOUTH 2 TIMES A DAY FOR BLOOD PRESSURE, Disp: 180 tablet, Rfl: 1   methocarbamol  (ROBAXIN ) 500 MG tablet, Take 500 mg by mouth 4 (four) times daily., Disp: , Rfl:    metoprolol  tartrate (LOPRESSOR ) 25 MG tablet, TAKE 1 TABLET BY MOUTH 2 TIMES A DAY, Disp: 180 tablet, Rfl: 1   montelukast  (SINGULAIR ) 10 MG tablet, TAKE 1 TABLET BY MOUTH AT BEDTIME, Disp: 90 tablet, Rfl: 3   nitroGLYCERIN  (NITROSTAT ) 0.4 MG SL tablet, Place 1 tablet (0.4 mg total) under the tongue every 5 (five) minutes as needed for chest pain., Disp: 15 tablet, Rfl: 1   omeprazole  (PRILOSEC) 40 MG capsule, TAKE 1 CAPSULE BY MOUTH TWO TIMES A DAY AS NEEDED FOR ACID REFLUX, Disp: 90 capsule, Rfl: 3   ondansetron  (ZOFRAN ) 4 MG tablet, Take 1 tablet (4 mg total) by mouth every 8 (eight) hours as needed for nausea or vomiting., Disp: 20 tablet, Rfl: 0   ramelteon  (ROZEREM ) 8 MG tablet, Take 1 tablet (8 mg total) by mouth at bedtime as needed for sleep., Disp: 30 tablet, Rfl: 2   Spacer/Aero-Holding Chambers (PRO COMFORT SPACER ADULT) MISC, 1 Device by Does not apply  route as needed., Disp: 1 each, Rfl: 1   traMADol  (ULTRAM ) 50 MG tablet, 1 tabs po q6 hours prn moderate pain, Disp: , Rfl:    Allergies  Allergen Reactions   Demerol Hcl [Meperidine] Anaphylaxis and Palpitations    flushed   Hydrocodone-Acetaminophen  Other (See Comments)   Ibuprofen Other (See Comments)    Stomach ulcers.   Nsaids Other (See Comments)    Peptic ulcers   Scar Care [Cream Base]     Redness and heat   Simvastatin      Muscle Pain   Tape Other (See Comments)    Sometimes removes skin   Meperidine Hcl Other (See Comments) and Rash   Percocet [Oxycodone -Acetaminophen ]  Itching and Rash   Prolia  [Denosumab ] Rash   Vicodin [Hydrocodone-Acetaminophen ] Itching   Wound Dressing Adhesive Rash    Past Medical History:  Diagnosis Date   Anemia    Arthritis    on meds   Bleeding ulcer    Cataract    recently dx-bilaterally   Dyspnea    sob realted to allergies and cough   Elevated cholesterol    on meds   GERD (gastroesophageal reflux disease)    on meds   Headache    Hiatal hernia    High blood pressure    on meds   Kidney cysts    Leukocytosis    idiopathic   Liver cyst    Nerve pain    L sciatic pain radiating to R   Osteoporosis    reaction to Prolia =   Peptic ulcer disease    PONV (postoperative nausea and vomiting)    Seasonal allergies      Past Surgical History:  Procedure Laterality Date   APPENDECTOMY  1992   BREAST CYST ASPIRATION Left 2003   COLONOSCOPY  2014   [facility]   KNEE ARTHROSCOPY Right 04/12/2001   torn cartilage   LAPAROSCOPY  1981   for endometriosis   LEG SURGERY Right 11/23/2000   to remove screws   LEG SURGERY Right 02/08/2002   to remove rod   LUMBAR LAMINECTOMY/DECOMPRESSION MICRODISCECTOMY N/A 05/26/2023   Procedure: Lumbar four-five, Lumbar five-Sacral one Decompression;  Surgeon: Burnetta Aures, MD;  Location: [facility];  Service: Orthopedics;  Laterality: N/A;  3 C-Bed   MENISCUS REPAIR  01/29/2003   TIBIA FRACTURE SURGERY Right 06/12/2000   Multiple surgeries   TONSILLECTOMY  1967   TOTAL ABDOMINAL HYSTERECTOMY  1992   TOTAL KNEE ARTHROPLASTY Right 02/14/2023   Procedure: TOTAL KNEE ARTHROPLASTY;  Surgeon: Melodi Lerner, MD;  Location: [facility] ORS;  Service: Orthopedics;  Laterality: Right;   WISDOM TOOTH EXTRACTION      Family History  Problem Relation Age of Onset   Kidney disease Mother    COPD Mother    COPD Father    Breast cancer Sister 62   Breast cancer Maternal Aunt    Breast cancer Paternal Aunt    Diabetes Maternal Grandfather    Stroke Paternal Grandfather    Colon cancer  Neg Hx    Colon polyps Neg Hx    Esophageal cancer Neg Hx    Rectal cancer Neg Hx    Stomach cancer Neg Hx     Social History   Tobacco Use   Smoking status: Never   Smokeless tobacco: Never  Vaping Use   Vaping status: Never Used  Substance Use Topics   Alcohol  use: No   Drug use: No    ROS   Objective:  Vitals: BP (!) 148/83 (BP Location: Right Arm)   Pulse 74   Temp 97.8 F (36.6 C) (Oral)   Resp 17   SpO2 98%   BP Readings from Last 3 Encounters:  03/08/24 (!) 148/83  05/28/23 (!) 97/42  05/17/23 (!) 146/84   Physical Exam Constitutional:      General: She is not in acute distress.    Appearance: Normal appearance. She is well-developed. She is not ill-appearing, toxic-appearing or diaphoretic.  HENT:     Head: Normocephalic and atraumatic.     Nose: Nose normal.     Mouth/Throat:     Mouth: Mucous membranes are moist.  Eyes:     General: No scleral icterus.       Right eye: No discharge.        Left eye: No discharge.     Extraocular Movements: Extraocular movements intact.  Cardiovascular:     Rate and Rhythm: Normal rate.  Pulmonary:     Effort: Pulmonary effort is normal.  Musculoskeletal:       Legs:  Skin:    General: Skin is warm and dry.  Neurological:     General: No focal deficit present.     Mental Status: She is alert and oriented to person, place, and time.  Psychiatric:        Mood and Affect: Mood normal.        Behavior: Behavior normal.     Assessment and Plan :   PDMP not reviewed this encounter.  1. Localized swelling of left lower leg    Recommended higher rated compression socks, elevating the leg, avoidance of high sodium foods (which she has been eating lately).  Will pursue rule out of DVT with ultrasound.  Since it is after hours, placed a future order and instructed patient to call centralized scheduling tomorrow.  Maintain all other medications.   Christopher Savannah, [place] 03/08/24 1740

## 2024-03-08 NOTE — Telephone Encounter (Signed)
 Called pt and advised PCP recommendations for urgent care, pt states she will head that way shortly. Nothing further needed at this time.

## 2024-03-08 NOTE — Telephone Encounter (Signed)
 Called pt to advise PCP recommendations on speaking to triage nurse, called triage and spoke with nurse; transferred pt to speak to her to be triaged.

## 2024-03-08 NOTE — Telephone Encounter (Signed)
 Please see pt msg and advise

## 2024-03-09 ENCOUNTER — Ambulatory Visit: Payer: Self-pay | Admitting: Urgent Care

## 2024-03-09 ENCOUNTER — Ambulatory Visit (HOSPITAL_COMMUNITY)
Admission: RE | Admit: 2024-03-09 | Discharge: 2024-03-09 | Disposition: A | Discharge status: Home / Self Care | Source: Ambulatory Visit | Attending: Urgent Care | Admitting: Urgent Care

## 2024-03-09 DIAGNOSIS — M7989 Other specified soft tissue disorders: Secondary | ICD-10-CM | POA: Insufficient documentation

## 2024-03-12 ENCOUNTER — Ambulatory Visit: Admitting: Family

## 2024-03-18 ENCOUNTER — Other Ambulatory Visit: Payer: Self-pay | Admitting: Acute Care

## 2024-03-18 DIAGNOSIS — R053 Chronic cough: Secondary | ICD-10-CM

## 2024-04-04 ENCOUNTER — Telehealth: Payer: Self-pay | Admitting: *Deleted

## 2024-04-04 ENCOUNTER — Telehealth: Payer: Self-pay | Admitting: Physician Assistant

## 2024-04-04 NOTE — Telephone Encounter (Signed)
   Name: Tracy Mckenzie  DOB: [birth year]  MRN: [id number]  Primary Cardiologist: Vina Gull, MD  Chart reviewed as part of pre-operative protocol coverage. Because of [name] past medical history and time since last visit, she will require a follow-up in-office visit in order to better assess preoperative cardiovascular risk.  Pre-op covering staff: - Please schedule appointment and call patient to inform them. If patient already had an upcoming appointment within acceptable timeframe, please add pre-op clearance to the appointment notes so provider is aware. - Please contact requesting surgeon's office via preferred method (i.e, phone, fax) to inform them of need for appointment prior to surgery.    Rollo FABIENE Louder, PA-C  04/04/2024, 4:26 PM

## 2024-04-04 NOTE — Telephone Encounter (Signed)
 [facility] faxed document Surgical Clearance, to be filled out by provider. Patient requested to send it back via Fax within ASAP. Document is located in providers tray at front office.Please advise at [phone].

## 2024-04-04 NOTE — Telephone Encounter (Signed)
 Patient not seen since October of 2024; please call and schedule patient for in office visit with PCP

## 2024-04-04 NOTE — Telephone Encounter (Signed)
 OV preop clearance now scheduled

## 2024-04-04 NOTE — Telephone Encounter (Signed)
   Pre-operative Risk Assessment    Patient Name: Tracy Mckenzie  DOB: 16-Mar-1956 MRN: [id number]   Date of last office visit: 12/22/22 ORREN FABRY, PAC Date of next office visit: NONE   Request for Surgical Clearance    Procedure:  EXTREME LATERAL INTERBODY FUSION L2-3 W/POSS (LEFT MESSAGE FOR SURG SCHEDULER TO CALL BACK TO CONFIRM COMPLETE PROCEDURE, FORM WAS CUT OFF, THIS MADE IT HAD TO SEE THE COMPLETE PROCEDURE TO BE DONE)  Date of Surgery:  Clearance TBD                                Surgeon:  DR. [facility] BROOKS Surgeon's Group or Practice Name:  [facility] Phone number:  [phone] Fax number:  [phone] LORI STONE   Type of Clearance Requested:   - Medical ; NONE INDICATED TO BE HELD   Type of Anesthesia:  General    Additional requests/questions:    Bonney Niels Jest   04/04/2024, 4:07 PM

## 2024-04-05 ENCOUNTER — Ambulatory Visit: Admitting: Physician Assistant

## 2024-04-06 NOTE — Telephone Encounter (Signed)
 Patient currently scheduled w/ PCP for 04/11/24.

## 2024-04-08 ENCOUNTER — Other Ambulatory Visit: Payer: Self-pay | Admitting: Physician Assistant

## 2024-04-09 ENCOUNTER — Telehealth: Payer: Self-pay | Admitting: Pulmonary Disease

## 2024-04-09 NOTE — Telephone Encounter (Signed)
 Pt has been scheduled w/Joan Charley at [facility] on 9/10 for her surgical clearance.  OK per Jamie. (Crm created) Pt concerned if she waits until 9/30, she may need to repeat MRI and other labs.  9/30 was first appt Dr Theophilus had open.

## 2024-04-09 NOTE — Telephone Encounter (Signed)
 Noted

## 2024-04-09 NOTE — Telephone Encounter (Signed)
 Fax received from Dr. Stefano Sprang with [facility] to perform a xtreme lateral interbody fusion L2-3 under general anesthesia on patient.  Patient needs surgery clearance. Surgery is pending. Patient was seen on 01/07/23 . Office protocol is a risk assessment can be sent to surgeon if patient has been seen in 60 days or less.   Pt is scheduled for appt with Dr. Theophilus for risk assessment on 05/01/24. Will hold in clearance pool until this has been completed.

## 2024-04-11 ENCOUNTER — Encounter (HOSPITAL_BASED_OUTPATIENT_CLINIC_OR_DEPARTMENT_OTHER): Payer: Self-pay

## 2024-04-11 ENCOUNTER — Encounter: Payer: Self-pay | Admitting: Physician Assistant

## 2024-04-11 ENCOUNTER — Ambulatory Visit (INDEPENDENT_AMBULATORY_CARE_PROVIDER_SITE_OTHER): Admitting: Physician Assistant

## 2024-04-11 ENCOUNTER — Ambulatory Visit (HOSPITAL_BASED_OUTPATIENT_CLINIC_OR_DEPARTMENT_OTHER)

## 2024-04-11 VITALS — BP 130/82 | HR 76 | Temp 97.5°F | Ht 66.0 in | Wt 189.6 lb

## 2024-04-11 DIAGNOSIS — M5137 Other intervertebral disc degeneration, lumbosacral region with discogenic back pain only: Secondary | ICD-10-CM | POA: Diagnosis not present

## 2024-04-11 DIAGNOSIS — D72829 Elevated white blood cell count, unspecified: Secondary | ICD-10-CM | POA: Diagnosis not present

## 2024-04-11 DIAGNOSIS — Z23 Encounter for immunization: Secondary | ICD-10-CM

## 2024-04-11 DIAGNOSIS — R5381 Other malaise: Secondary | ICD-10-CM | POA: Diagnosis not present

## 2024-04-11 DIAGNOSIS — R7303 Prediabetes: Secondary | ICD-10-CM

## 2024-04-11 DIAGNOSIS — E782 Mixed hyperlipidemia: Secondary | ICD-10-CM | POA: Diagnosis not present

## 2024-04-11 DIAGNOSIS — R053 Chronic cough: Secondary | ICD-10-CM | POA: Diagnosis not present

## 2024-04-11 DIAGNOSIS — Z01811 Encounter for preprocedural respiratory examination: Secondary | ICD-10-CM

## 2024-04-11 DIAGNOSIS — Z01818 Encounter for other preprocedural examination: Secondary | ICD-10-CM

## 2024-04-11 DIAGNOSIS — K219 Gastro-esophageal reflux disease without esophagitis: Secondary | ICD-10-CM | POA: Diagnosis not present

## 2024-04-11 MED ORDER — OMEPRAZOLE 40 MG PO CPDR: 40.0000 mg | DELAYED_RELEASE_CAPSULE | Freq: Two times a day (BID) | ORAL | 1 refills | Status: DC

## 2024-04-11 MED ORDER — BENZONATATE 100 MG PO CAPS: ORAL_CAPSULE | ORAL | 2 refills | Status: DC

## 2024-04-11 MED ORDER — ALBUTEROL SULFATE HFA 108 (90 BASE) MCG/ACT IN AERS: 2.0000 | INHALATION_SPRAY | RESPIRATORY_TRACT | 1 refills | Status: AC | PRN

## 2024-04-11 MED ORDER — AZELASTINE HCL 0.1 % NA SOLN: 1.0000 | Freq: Two times a day (BID) | NASAL | 5 refills | Status: DC

## 2024-04-11 NOTE — Progress Notes (Signed)
 @Patient  ID: Tracy Mckenzie, female    DOB: Oct 29, 1955, 68 y.o.   MRN: [id number]  Chief Complaint  Patient presents with   surgical clearance    Referring provider: Allwardt, Mardy HERO, PA-C  HPI: Tracy Mckenzie is a 68 y/o female with PMH of seasonal allergies, chronic cough, and postnasal drip, and scoliosis who presents today for surgical clearance.  She is set to have a L2-L3 fusion in the setting of her scoliosis.  She reports that they have not given her a timeframe for the procedure but expects that it will last at least 2 to 3 hours in duration.  She reports that they are planning to do a posterolateral incision.  She denies having had any recent lung infections in the last 1 to 2 months.  Overall she states that she has been doing rather well but is resumed having issues with postnasal drip.  Today she requests refills for both her Astelin  and her albuterol .  She reports that she rarely uses albuterol  and has not been using her Astelin .  She does report she has been using Claritin , Singulair , and Xyzal.  Overall she has not had any breathing issues.  She denies fever, chills, night sweats, productive cough, chest pain, headaches, appetite changes, weight changes.  TEST/EVENTS : PFTs completed in October 2021:  normal PFT  Allergies  Allergen Reactions   Demerol Hcl [Meperidine] Anaphylaxis and Palpitations    flushed   Hydrocodone-Acetaminophen  Other (See Comments)   Ibuprofen Other (See Comments)    Stomach ulcers.   Nsaids Other (See Comments)    Peptic ulcers   Scar Care [Cream Base]     Redness and heat   Simvastatin      Muscle Pain   Tape Other (See Comments)    Sometimes removes skin   Meperidine Hcl Other (See Comments) and Rash   Percocet [Oxycodone -Acetaminophen ] Itching and Rash   Prolia  [Denosumab ] Rash   Vicodin [Hydrocodone-Acetaminophen ] Itching   Wound Dressing Adhesive Rash    Immunization History  Administered Date(s) Administered   Fluad  Quad(high Dose 65+) 05/20/2021, 06/28/2022   INFLUENZA, HIGH DOSE SEASONAL PF 05/10/2019, 05/05/2023   Influenza-Unspecified 05/30/2015, 06/30/2020, 06/28/2022, 05/05/2023   Moderna Sars-Covid-2 Vaccination 09/15/2019, 10/13/2019, 06/30/2020   PNEUMOCOCCAL CONJUGATE-20 02/05/2022   Pfizer(Comirnaty)Fall Seasonal Vaccine 12 years and older 06/30/2020   Pneumococcal Polysaccharide-23 07/11/2019   RSV,unspecified 06/28/2022   Respiratory Syncytial Virus Vaccine,Recomb Aduvanted(Arexvy) 06/28/2022   Tdap 05/02/2014   Zoster, Live 05/31/2015, 06/06/2015    Past Medical History:  Diagnosis Date   Allergy    Anemia    Arthritis    on meds   Bleeding ulcer    Cataract    recently dx-bilaterally   Dyspnea    sob realted to allergies and cough   Elevated cholesterol    on meds   GERD (gastroesophageal reflux disease)    on meds   Headache    Hiatal hernia    High blood pressure    on meds   Kidney cysts    Leukocytosis    idiopathic   Liver cyst    Nerve pain    L sciatic pain radiating to R   Osteoporosis    reaction to Prolia =   Peptic ulcer disease    PONV (postoperative nausea and vomiting)    Seasonal allergies     Tobacco History: Social History   Tobacco Use  Smoking Status Never  Smokeless Tobacco Never   Counseling given: Not Answered  Outpatient Medications Prior to Visit  Medication Sig Dispense Refill   gabapentin  (NEURONTIN ) 100 MG capsule TAKE 1 CAPSULE BY MOUTH AT BEDTIME 30 capsule 9   hydrochlorothiazide  (MICROZIDE ) 12.5 MG capsule TAKE 1 CAPSULE BY MOUTH DAILY 90 capsule 3   hydrOXYzine  (ATARAX ) 10 MG tablet TAKE 1 TABLET BY MOUTH EVERY 6 HOURS 60 tablet 2   loratadine  (CLARITIN ) 10 MG tablet Take 10 mg by mouth 2 (two) times daily.     losartan  (COZAAR ) 50 MG tablet TAKE 1 TABLET BY MOUTH 2 TIMES A DAY FOR BLOOD PRESSURE 180 tablet 1   methocarbamol  (ROBAXIN ) 500 MG tablet Take 500 mg by mouth 4 (four) times daily.     metoprolol  tartrate  (LOPRESSOR ) 25 MG tablet TAKE 1 TABLET BY MOUTH 2 TIMES A DAY 180 tablet 1   montelukast  (SINGULAIR ) 10 MG tablet TAKE 1 TABLET BY MOUTH AT BEDTIME 90 tablet 3   nitroGLYCERIN  (NITROSTAT ) 0.4 MG SL tablet Place 1 tablet (0.4 mg total) under the tongue every 5 (five) minutes as needed for chest pain. 15 tablet 1   omeprazole  (PRILOSEC) 40 MG capsule TAKE 1 CAPSULE BY MOUTH TWO TIMES A DAY AS NEEDED FOR ACID REFLUX 90 capsule 3   ondansetron  (ZOFRAN ) 4 MG tablet Take 1 tablet (4 mg total) by mouth every 8 (eight) hours as needed for nausea or vomiting. 20 tablet 0   ramelteon  (ROZEREM ) 8 MG tablet Take 1 tablet (8 mg total) by mouth at bedtime as needed for sleep. 30 tablet 2   Spacer/Aero-Holding Chambers (PRO COMFORT SPACER ADULT) MISC 1 Device by Does not apply route as needed. 1 each 1   traMADol  (ULTRAM ) 50 MG tablet 1 tabs po q6 hours prn moderate pain     albuterol  (VENTOLIN  HFA) 108 (90 Base) MCG/ACT inhaler INHALE 2 PUFFS BY MOUTH EVERY 6 HOURS AS NEEDED FOR WHEEZING OR SHORTNESS OF BREATH 18 g 1   azelastine  (ASTELIN ) 0.1 % nasal spray Place 1 spray into both nostrils 2 (two) times daily as needed for rhinitis or allergies. (Patient taking differently: Place 1 spray into both nostrils 2 (two) times daily.) 30 mL 5   benzonatate  (TESSALON ) 100 MG capsule TAKE 1 OR 2 CAPSULES BY MOUTH TWO TIMES A DAY AS NEEDED FOR COUGH 30 capsule 2   gabapentin  (NEURONTIN ) 300 MG capsule TAKE 1 CAPSULE BY MOUTH 3 TIMES A DAY (Patient not taking: Reported on 04/11/2024) 45 capsule 2   No facility-administered medications prior to visit.     Review of Systems: as per HPI  Constitutional:   No  weight loss, night sweats,  Fevers, chills, fatigue, or  lassitude.  HEENT:   No headaches,  Difficulty swallowing,  Tooth/dental problems, or  Sore throat,                No sneezing, itching, ear ache, nasal congestion, post nasal drip,   CV:  No chest pain,  Orthopnea, PND, swelling in lower extremities,  anasarca, dizziness, palpitations, syncope.   GI  No heartburn, indigestion, abdominal pain, nausea, vomiting, diarrhea, change in bowel habits, loss of appetite, bloody stools.   Resp: No shortness of breath with exertion or at rest.  No excess mucus, no productive cough,  No non-productive cough,  No coughing up of blood.  No change in color of mucus.  No wheezing.  No chest wall deformity  Skin: no rash or lesions.  GU: no dysuria, change in color of urine, no urgency or frequency.  No  flank pain, no hematuria   MS:  No joint pain or swelling.  No decreased range of motion.  No back pain.    Physical Exam  BP 134/82   Pulse 78   Ht 5' 6 (1.676 m)   Wt 189 lb (85.7 kg)   SpO2 93%   BMI 30.51 kg/m   GEN: A/Ox3; pleasant , NAD, well nourished    HEENT:  [place]/AT,  EACs-clear, TMs-wnl, NOSE-clear, THROAT-clear, no lesions, no postnasal drip or exudate noted. Mallampati III  NECK:  Supple w/ fair ROM; no JVD; normal carotid impulses w/o bruits; no thyromegaly or nodules palpated; no lymphadenopathy.    RESP  Clear  P & A; w/o, wheezes/ rales/ or rhonchi. no accessory muscle use, no dullness to percussion  CARD:  RRR, no m/r/g, no peripheral edema, pulses intact, no cyanosis or clubbing.  GI:   Soft & nt; nml bowel sounds; no organomegaly or masses detected.   Musco: Warm bil, no deformities or joint swelling noted.   Neuro: alert, no focal deficits noted.    Skin: Warm, no lesions or rashes    Lab Results:  CBC    Component Value Date/Time   WBC 11.1 (H) 05/12/2023 1000   RBC 4.49 05/12/2023 1000   HGB 13.0 05/12/2023 1000   HGB 12.8 08/06/2020 0750   HCT 40.7 05/12/2023 1000   PLT 385 05/12/2023 1000   PLT 356 08/06/2020 0750   MCV 90.6 05/12/2023 1000   MCH 29.0 05/12/2023 1000   MCHC 31.9 05/12/2023 1000   RDW 13.6 05/12/2023 1000   LYMPHSABS 2.3 01/03/2023 0927   MONOABS 0.6 01/03/2023 0927   EOSABS 0.9 (H) 01/03/2023 0927   BASOSABS 0.1 01/03/2023 0927     BMET    Component Value Date/Time   NA 141 05/12/2023 1000   K 3.6 05/12/2023 1000   CL 105 05/12/2023 1000   CO2 25 05/12/2023 1000   GLUCOSE 96 05/12/2023 1000   BUN 7 (L) 05/12/2023 1000   CREATININE 0.92 05/12/2023 1000   CREATININE 0.95 08/06/2020 0750   CREATININE 0.90 03/18/2020 1052   CALCIUM  9.6 05/12/2023 1000   GFRNONAA >60 05/12/2023 1000   GFRNONAA >60 08/06/2020 0750   GFRAA >60 04/14/2020 1034    BNP No results found for: BNP  ProBNP No results found for: PROBNP  Imaging: No results found.  Administration History     None          Latest Ref Rng & Units 05/23/2020    8:58 AM  PFT Results  FVC-Pre L 2.90   FVC-Predicted Pre % 83   FVC-Post L 2.85   FVC-Predicted Post % 82   Pre FEV1/FVC % % 76   Post FEV1/FCV % % 77   FEV1-Pre L 2.19   FEV1-Predicted Pre % 82   FEV1-Post L 2.20   DLCO uncorrected ml/min/mmHg 18.44   DLCO UNC% % 85   DLCO corrected ml/min/mmHg 18.67   DLCO COR %Predicted % 86   DLVA Predicted % 110   TLC L 5.47   TLC % Predicted % 100   RV % Predicted % 112     No results found for: NITRICOXIDE   Assessment & Plan:   Assessment & Plan Chronic cough -  Due to seasonal allergies and postnasal drip -  Resume Astelin  -  Continue Singulair , Claritin , and Xyzal -  May use Albuterol  prn  Risk assessment via ARISCAT score demonstrates intermediate risk for postoperative pulmonary complications.  This  is mainly due to the patient's age, duration of surgery, and location of surgery.  Prior pulmonary function tests have been normal and she has had no notable change in respiratory status.  Postoperatively plan for early ambulation, postoperative oral care as indicated, deep breathing and coughing exercises, incentive spirometry, and adequate pain management.  Plan for the patient to start incentive spirometry and breathing exercises daily in the weeks prior to her surgery.  She is agreeable.  ARISCAT  score: Intermediate risk 13.3% risk of in-hospital post-op pulmonary complications (composite including respiratory failure, respiratory infection, pleural effusion, atelectasis, pneumothorax, bronchospasm, aspiration pneumonitis)  Return in about 6 months (around 10/09/2024).  RTC sooner if new or worsening symptoms.  Candis Dandy, PA-C 04/11/2024

## 2024-04-11 NOTE — Progress Notes (Unsigned)
 Patient ID: Tracy Mckenzie, female    DOB: 1956-01-20, 68 y.o.   MRN: [id number]   Assessment & Plan:  Pre-operative exam -     CBC with Differential/Platelet; Future -     Comprehensive metabolic panel with GFR; Future -     Hemoglobin A1c; Future -     Lipid panel; Future  Immunization due  Prediabetes -     Comprehensive metabolic panel with GFR; Future -     Hemoglobin A1c; Future  Mixed hyperlipidemia -     Lipid panel; Future  Leukocytosis, unspecified type -     CBC with Differential/Platelet; Future  Gastroesophageal reflux disease without esophagitis -     Omeprazole ; Take 1 capsule (40 mg total) by mouth 2 (two) times daily. TAKE 1 CAPSULE BY MOUTH TWO TIMES A DAY AS NEEDED FOR ACID REFLUX  Dispense: 180 capsule; Refill: 1  Degeneration of intervertebral disc of lumbosacral region with discogenic back pain  Physical deconditioning      Assessment and Plan Assessment & Plan Degenerative lumbar scoliosis with spinal stenosis and disc collapse Degenerative scoliosis with asymmetrical collapse of the L2, L3 disc space and spinal stenosis. Previous lumbar decompression was helpful, but significant back pain persists. - Obtain cardiology and pulmonology clearance for surgery - Proceed with L2, L3 fusion once clearance is obtained  Chronic low back pain Chronic low back pain secondary to degenerative scoliosis and disc collapse, significantly affecting quality of life and mobility. - Proceed with L2, L3 fusion to address pain - Continue low-impact exercises such as biking or using an elliptical to maintain leg motion  Deconditioning Deconditioning due to limited mobility from chronic back pain. Shortness of breath and difficulty walking likely related to deconditioning rather than cardiopulmonary issues. - Continue low-impact exercises such as biking or using an elliptical - Await pulmonology and cardiology evaluations  Prediabetes Prediabetes with an A1c of  5.8, indicating early stage. Lifestyle modifications are recommended to prevent progression to diabetes. - Order A1c and fasting labs - Encourage lifestyle modifications including increased physical activity and dietary changes to reduce sugar intake Lab Results  Component Value Date   HGBA1C 6.2 04/12/2024   HGBA1C 5.8 01/03/2023   HGBA1C 5.6 07/02/2021     Gastroesophageal reflux disease (GERD) GERD managed with omeprazole . Occasional need for increased dosage noted, possibly related to dietary factors. - Continue omeprazole  twice daily as needed - Refill omeprazole  prescription for 90 days  Mixed HLD - recheck lipid panel - prior intolerance to statins (muscle pain)       Return in about 4 months (around 08/11/2024) for recheck/follow-up.    Subjective:    Chief Complaint  Patient presents with   Pre-op Exam    Pt in office for Pre Op clearance from PCP and form completion;    HPI Discussed the use of AI scribe software for clinical note transcription with the patient, who gave verbal consent to proceed.  History of Present Illness Tracy Mckenzie is a 68 year old female with degenerative scoliosis and spinal stenosis who presents for evaluation of persistent back pain and surgical planning.  She experiences significant back pain that has persisted despite previous lumbar decompression surgery for spinal stenosis. The pain is severe enough to impact her quality of life, limiting her ability to perform daily activities and exercise. She has a history of scoliosis and has been told she has collapse of a disc space in her lumbar spine. Previous treatments have included injections at  different sites to identify the pain source.  She describes a long-standing history of scoliosis, which she believes may have been present since her children were young, as she recalls difficulty balancing weight on her shoulders. Her current symptoms include severe back pain that limits  her ability to eat without discomfort and causes significant lifestyle restrictions. She is unable to engage in activities she previously enjoyed and reports feeling deconditioned.  She has been attempting to maintain some level of physical activity by using stationary bikes and ellipticals at the [facility]. She reports swelling in one leg that has not fully resolved and experiences shortness of breath with exertion, which she attributes to being out of shape. She has a history of prediabetes, with an A1c of 5.8, and is attempting to modify her diet by reducing soda intake and increasing water  consumption.  She has a history of ulcers, for which she takes omeprazole  twice daily, and has experienced issues with anesthesia in the past, specifically difficulty waking up after a hysterectomy. She also reports nasal drip and cough, which she attributes to running out of nasal spray. She uses [employer] Original candies to manage her cough due to intolerance to other cough drops.     Past Medical History:  Diagnosis Date   Allergy    Anemia    Arthritis    on meds   Bleeding ulcer    Cataract    recently dx-bilaterally   Dyspnea    sob realted to allergies and cough   Elevated cholesterol    on meds   GERD (gastroesophageal reflux disease)    on meds   Headache    Hiatal hernia    High blood pressure    on meds   Kidney cysts    Leukocytosis    idiopathic   Liver cyst    Nerve pain    L sciatic pain radiating to R   Osteoporosis    reaction to Prolia =   Peptic ulcer disease    PONV (postoperative nausea and vomiting)    Seasonal allergies     Past Surgical History:  Procedure Laterality Date   APPENDECTOMY  1992   BREAST CYST ASPIRATION Left 2003   COLONOSCOPY  2014   [facility]   FRACTURE SURGERY  06/11/2000   KNEE ARTHROSCOPY Right 04/12/2001   torn cartilage   LAPAROSCOPY  1981   for endometriosis   LEG SURGERY Right 11/23/2000   to remove screws   LEG SURGERY  Right 02/08/2002   to remove rod   LUMBAR LAMINECTOMY/DECOMPRESSION MICRODISCECTOMY N/A 05/26/2023   Procedure: Lumbar four-five, Lumbar five-Sacral one Decompression;  Surgeon: Burnetta Aures, MD;  Location: [facility] OR;  Service: Orthopedics;  Laterality: N/A;  3 C-Bed   MENISCUS REPAIR  01/29/2003   TIBIA FRACTURE SURGERY Right 06/12/2000   Multiple surgeries   TONSILLECTOMY  1967   TOTAL ABDOMINAL HYSTERECTOMY  1992   TOTAL KNEE ARTHROPLASTY Right 02/14/2023   Procedure: TOTAL KNEE ARTHROPLASTY;  Surgeon: Melodi Lerner, MD;  Location: [facility] ORS;  Service: Orthopedics;  Laterality: Right;   WISDOM TOOTH EXTRACTION      Family History  Problem Relation Age of Onset   Kidney disease Mother    COPD Mother    Hearing loss Mother    Heart disease Mother    Hypertension Mother    Miscarriages / Stillbirths Mother    Obesity Mother    Vision loss Mother    COPD Father    Breast cancer Sister 67  Breast cancer Maternal Aunt    Breast cancer Paternal Aunt    Diabetes Maternal Grandfather    Heart disease Maternal Grandfather    Stroke Paternal Grandfather    Diabetes Sister    Obesity Sister    Colon cancer Neg Hx    Colon polyps Neg Hx    Esophageal cancer Neg Hx    Rectal cancer Neg Hx    Stomach cancer Neg Hx     Social History   Tobacco Use   Smoking status: Never   Smokeless tobacco: Never  Vaping Use   Vaping status: Never Used  Substance Use Topics   Alcohol  use: No   Drug use: No     Allergies  Allergen Reactions   Demerol Hcl [Meperidine] Anaphylaxis and Palpitations    flushed   Hydrocodone-Acetaminophen  Other (See Comments)   Ibuprofen Other (See Comments)    Stomach ulcers.   Nsaids Other (See Comments)    Peptic ulcers   Scar Care [Cream Base]     Redness and heat   Simvastatin      Muscle Pain   Tape Other (See Comments)    Sometimes removes skin   Meperidine Hcl Other (See Comments) and Rash   Percocet [Oxycodone -Acetaminophen ] Itching and Rash    Prolia  [Denosumab ] Rash   Vicodin [Hydrocodone-Acetaminophen ] Itching   Wound Dressing Adhesive Rash    Review of Systems NEGATIVE UNLESS OTHERWISE INDICATED IN HPI      Objective:     BP 130/82 (BP Location: Left Arm, Patient Position: Sitting, Cuff Size: Normal)   Pulse 76   Temp (!) 97.5 F (36.4 C) (Temporal)   Ht 5' 6 (1.676 m)   Wt 189 lb 9.6 oz (86 kg)   SpO2 95%   BMI 30.60 kg/m   Wt Readings from Last 3 Encounters:  04/11/24 189 lb 9.6 oz (86 kg)  04/11/24 189 lb (85.7 kg)  11/07/23 180 lb (81.6 kg)    BP Readings from Last 3 Encounters:  04/11/24 130/82  04/11/24 134/82  03/08/24 135/81     Physical Exam Vitals and nursing note reviewed.  Constitutional:      Appearance: Normal appearance.  Eyes:     Extraocular Movements: Extraocular movements intact.     Conjunctiva/sclera: Conjunctivae normal.     Pupils: Pupils are equal, round, and reactive to light.  Cardiovascular:     Rate and Rhythm: Normal rate and regular rhythm.  Pulmonary:     Effort: Pulmonary effort is normal.     Breath sounds: Normal breath sounds.  Musculoskeletal:     Right lower leg: No edema.     Left lower leg: No edema.  Skin:    Findings: No lesion or rash.  Neurological:     Mental Status: She is alert.     Gait: Gait abnormal.  Psychiatric:        Mood and Affect: Mood normal.        Behavior: Behavior normal.             Windie Marasco M Doyel Mulkern, PA-C

## 2024-04-11 NOTE — Assessment & Plan Note (Signed)
-    Due to seasonal allergies and postnasal drip -  Resume Astelin  -  Continue Singulair , Claritin , and Xyzal -  May use Albuterol  prn  Risk assessment via ARISCAT score demonstrates intermediate risk for postoperative pulmonary complications.  This is mainly due to the patient's age, duration of surgery, and location of surgery.  Prior pulmonary function tests have been normal and she has had no notable change in respiratory status.  Postoperatively plan for early ambulation, postoperative oral care as indicated, deep breathing and coughing exercises, incentive spirometry, and adequate pain management.  Plan for the patient to start incentive spirometry and breathing exercises daily in the weeks prior to her surgery.  She is agreeable.  ARISCAT score:

## 2024-04-11 NOTE — Patient Instructions (Addendum)
 Paperwork completed for surgical clearance.   Follow up in 6 months; sooner if new or worsening symptoms.  Continue Singulair , Claritin , Xyzal, and Astelin .  Rx refills sent in today for Astelin  and Albuterol .  Postoperative recommendations include:  early ambulation, incentive spirometry use, deep breathing and coughing exercises, adequate pain management and oral care as indicated.

## 2024-04-12 ENCOUNTER — Ambulatory Visit: Payer: Self-pay | Admitting: Physician Assistant

## 2024-04-12 ENCOUNTER — Other Ambulatory Visit (INDEPENDENT_AMBULATORY_CARE_PROVIDER_SITE_OTHER)

## 2024-04-12 DIAGNOSIS — Z01818 Encounter for other preprocedural examination: Secondary | ICD-10-CM

## 2024-04-12 DIAGNOSIS — R7303 Prediabetes: Secondary | ICD-10-CM

## 2024-04-12 DIAGNOSIS — E782 Mixed hyperlipidemia: Secondary | ICD-10-CM

## 2024-04-12 DIAGNOSIS — D72829 Elevated white blood cell count, unspecified: Secondary | ICD-10-CM | POA: Diagnosis not present

## 2024-04-12 LAB — CBC WITH DIFFERENTIAL/PLATELET
Basophils Absolute: 0.1 K/uL (ref 0.0–0.1)
Basophils Relative: 0.6 % (ref 0.0–3.0)
Eosinophils Absolute: 0.3 K/uL (ref 0.0–0.7)
Eosinophils Relative: 3.2 % (ref 0.0–5.0)
HCT: 37.5 % (ref 36.0–46.0)
Hemoglobin: 12.5 g/dL (ref 12.0–15.0)
Lymphocytes Relative: 23.2 % (ref 12.0–46.0)
Lymphs Abs: 2.3 K/uL (ref 0.7–4.0)
MCHC: 33.3 g/dL (ref 30.0–36.0)
MCV: 84.2 fl (ref 78.0–100.0)
Monocytes Absolute: 0.6 K/uL (ref 0.1–1.0)
Monocytes Relative: 6.3 % (ref 3.0–12.0)
Neutro Abs: 6.5 K/uL (ref 1.4–7.7)
Neutrophils Relative %: 66.7 % (ref 43.0–77.0)
Platelets: 338 K/uL (ref 150.0–400.0)
RBC: 4.46 Mil/uL (ref 3.87–5.11)
RDW: 16.3 % — ABNORMAL HIGH (ref 11.5–15.5)
WBC: 9.8 K/uL (ref 4.0–10.5)

## 2024-04-12 LAB — COMPREHENSIVE METABOLIC PANEL WITH GFR
ALT: 17 U/L (ref 0–35)
AST: 18 U/L (ref 0–37)
Albumin: 3.9 g/dL (ref 3.5–5.2)
Alkaline Phosphatase: 141 U/L — ABNORMAL HIGH (ref 39–117)
BUN: 12 mg/dL (ref 6–23)
CO2: 27 meq/L (ref 19–32)
Calcium: 9.3 mg/dL (ref 8.4–10.5)
Chloride: 102 meq/L (ref 96–112)
Creatinine, Ser: 0.94 mg/dL (ref 0.40–1.20)
GFR: 61.59 mL/min (ref >60.00)
Glucose, Bld: 101 mg/dL — ABNORMAL HIGH (ref 70–99)
Potassium: 3.8 meq/L (ref 3.5–5.1)
Sodium: 138 meq/L (ref 135–145)
Total Bilirubin: 0.5 mg/dL (ref 0.2–1.2)
Total Protein: 6.6 g/dL (ref 6.0–8.3)

## 2024-04-12 LAB — LIPID PANEL
Cholesterol: 194 mg/dL (ref 0–200)
HDL: 45.6 mg/dL (ref >39.00)
LDL Cholesterol: 129 mg/dL — ABNORMAL HIGH (ref 0–99)
NonHDL: 148.83
Total CHOL/HDL Ratio: 4
Triglycerides: 99 mg/dL (ref 0.0–149.0)
VLDL: 19.8 mg/dL (ref 0.0–40.0)

## 2024-04-12 LAB — HEMOGLOBIN A1C: Hgb A1c MFr Bld: 6.2 % (ref 4.6–6.5)

## 2024-04-13 NOTE — Progress Notes (Signed)
 Cardiology Office Note    Patient Name: Tracy Mckenzie Date of Encounter: 04/16/2024  Primary Care Provider:  Allwardt, Mardy HERO, PA-C Primary Cardiologist:  Vina Gull, MD Primary Electrophysiologist: None   Past Medical History    Past Medical History:  Diagnosis Date   Allergy    Anemia    Arthritis    on meds   Bleeding ulcer    Cataract    recently dx-bilaterally   Dyspnea    sob realted to allergies and cough   Elevated cholesterol    on meds   GERD (gastroesophageal reflux disease)    on meds   Headache    Hiatal hernia    High blood pressure    on meds   Kidney cysts    Leukocytosis    idiopathic   Liver cyst    Nerve pain    L sciatic pain radiating to R   Osteoporosis    reaction to Prolia =   Peptic ulcer disease    PONV (postoperative nausea and vomiting)    Seasonal allergies     History of Present Illness  Tracy Mckenzie is a 68 y.o. female with a PMH of HTN, HLD, anemia, GERD who presents today for preoperative clearance.  Tracy Mckenzie was seen initially by Dr. Gull in 2022 for complaint of chest pain.  She had completed a prior stress echo at [facility] in 2011 that was negative.  Had a CT calcium  score for further evaluation that showed a calcium  score of 29 with evidence of mild coronary disease.  She was advised to start statin therapy at that and was seen in follow-up on 12/22/2022.  She completed 2D echo on 12/24/2022 that showed EF of 60 to 65% with grade 1 DD no significant valve abnormalities.  Tracy Mckenzie presents today for preoperative clearance and follow-up.  She reports since her previous follow-up experiencing episodes of shortness of breath but denies any chest pain or dizziness.  She has been limited with activity due to low back pain feels that her shortness of breath is attributed to lack of movement.  Her blood pressure today was stable at 102/68 and heart rate was 73 bpm.  She has been compliant with her current medications and denies any  adverse reactions.  She is pending upcoming vertebral fusion and presents for clearance.  Patient denies chest pain, palpitations, dyspnea, PND, orthopnea, nausea, vomiting, dizziness, syncope, edema, weight gain, or early satiety.  Discussed the use of AI scribe software for clinical note transcription with the patient, who gave verbal consent to proceed.  History of Present Illness   Review of Systems  Please see the history of present illness.    All other systems reviewed and are otherwise negative except as noted above.  Physical Exam    Wt Readings from Last 3 Encounters:  04/16/24 189 lb (85.7 kg)  04/11/24 189 lb 9.6 oz (86 kg)  04/11/24 189 lb (85.7 kg)   VS: Vitals:   04/16/24 0856  BP: 102/68  Pulse: 73  SpO2: 94%  ,Body mass index is 30.51 kg/m. GEN: Well nourished, well developed in no acute distress Neck: No JVD; No carotid bruits Pulmonary: Clear to auscultation without rales, wheezing or rhonchi  Cardiovascular: Normal rate. Regular rhythm. Normal S1. Normal S2.   Murmurs: There is no murmur.  ABDOMEN: Soft, non-tender, non-distended EXTREMITIES: Trace left lower extremity edema  EKG/LABS/ Recent Cardiac Studies   ECG personally reviewed by me today -sinus rhythm with rate of 73  bpm and left axis deviation with RBBB and no acute changes.  Risk Assessment/Calculations:          Lab Results  Component Value Date   WBC 9.8 04/12/2024   HGB 12.5 04/12/2024   HCT 37.5 04/12/2024   MCV 84.2 04/12/2024   PLT 338.0 04/12/2024   Lab Results  Component Value Date   CREATININE 0.94 04/12/2024   BUN 12 04/12/2024   NA 138 04/12/2024   K 3.8 04/12/2024   CL 102 04/12/2024   CO2 27 04/12/2024   Lab Results  Component Value Date   CHOL 194 04/12/2024   HDL 45.60 04/12/2024   LDLCALC 129 (H) 04/12/2024   TRIG 99.0 04/12/2024   CHOLHDL 4 04/12/2024    Lab Results  Component Value Date   HGBA1C 6.2 04/12/2024   Assessment & Plan    Assessment &  Plan   1.  Preop clearance: - Patient's RCRI score is 0.9%  The patient affirms she has been doing well without any new cardiac symptoms. They are able to achieve 6 METS without cardiac limitations. Therefore, based on ACC/AHA guidelines, the patient would be at acceptable risk for the planned procedure without further cardiovascular testing. The patient was advised that if she develops new symptoms prior to surgery to contact our office to arrange for a follow-up visit, and she verbalized understanding.   2.  Essential hypertension: - Patient's blood pressure today is stable at 102/68 - Continue Microzide  12.5 mg, metoprolol  25 mg twice daily and losartan  50 mg twice daily  3.  Hyperlipidemia: - Patient's last LDL cholesterol was 55 on 03/7974 - She reports side effects and prior myalgias with statins - Patient will be referred to Pharm.D. to discuss alternatives to statin therapy  4.  Atypical chest pain: - Today patient reports no chest pain  - Continue metoprolol  25 mg twice daily and follow-up with Pharm.D. for guidance on cholesterol medication.  Disposition: Follow-up with Vina Gull, MD or APP in 6 months   Signed, Wyn Raddle, Jackee Shove, NP 04/16/2024, 12:10 PM [facility]

## 2024-04-16 ENCOUNTER — Ambulatory Visit: Attending: Cardiology | Admitting: Pharmacist

## 2024-04-16 ENCOUNTER — Encounter: Payer: Self-pay | Admitting: Pharmacist

## 2024-04-16 ENCOUNTER — Ambulatory Visit: Attending: Nurse Practitioner | Admitting: Nurse Practitioner

## 2024-04-16 VITALS — BP 102/68 | HR 73 | Ht 66.0 in | Wt 189.0 lb

## 2024-04-16 DIAGNOSIS — G720 Drug-induced myopathy: Secondary | ICD-10-CM

## 2024-04-16 DIAGNOSIS — I10 Essential (primary) hypertension: Secondary | ICD-10-CM | POA: Diagnosis not present

## 2024-04-16 DIAGNOSIS — R0789 Other chest pain: Secondary | ICD-10-CM | POA: Diagnosis not present

## 2024-04-16 DIAGNOSIS — E785 Hyperlipidemia, unspecified: Secondary | ICD-10-CM

## 2024-04-16 DIAGNOSIS — Z01810 Encounter for preprocedural cardiovascular examination: Secondary | ICD-10-CM

## 2024-04-16 DIAGNOSIS — T466X5D Adverse effect of antihyperlipidemic and antiarteriosclerotic drugs, subsequent encounter: Secondary | ICD-10-CM

## 2024-04-16 NOTE — Progress Notes (Signed)
 Patient ID: Tracy Mckenzie                 DOB: [birth year]                    MRN: [id number]     HPI: Tracy Mckenzie is a 68 y.o. female patient referred to lipid clinic by Jackee Alberts NP. PMH is significant for HTN, RBBB, CAD, HLD, and elevated coronary calcium  score. Patient is statin intolerant.  Patient presents today to discuss lipid management. Had appt earlier today for pre op clearance. Pending spinal surgery.  Has a history of mutliple medication intolerances and allergies. Has had multiple orthopedic procedures and developed allergies  to numerous pain medications. Is now followed by pain management.  Has a history of taking simvastatin, ezetimibe , and rosuvastatin . Had an allergic reaction to rosuvastatin  which caused a rash. Simvastatin caused myalgias. She may have been on different statins in the past but she does not remember.  She is currently taking beet supplements and co q 10.  Both mother and father have history of CAD.  She is worried about starting any new medications before her upcoming surgery.   Current Medications: N/A  Intolerances:  Simvastatin (leg pains) Rosuvastatin  (rash) Ezetimibe   Risk Factors:  CAD Elevated coronary calcium  score Family history  LDL goal: <70  Labs: TC 194, Trigs 99, HDL 45.6, LDL 129   Imaging: FINDINGS: Coronary arteries: Normal origins.   Coronary Calcium  Score:   Left main: 5.6   Left anterior descending artery: 24.3   Left circumflex artery: 0   Right coronary artery: 0   Total: 29.9   Percentile: 66th   Pericardium: Normal.   Ascending Aorta: Normal caliber. Scattered calcifications in the descending aorta and aortic arch.   IMPRESSION: Coronary calcium  score of 29.9. This was 66th percentile for age-,race-, and sex-matched controls.  Past Medical History:  Diagnosis Date   Allergy    Anemia    Arthritis    on meds   Bleeding ulcer    Cataract    recently dx-bilaterally   Dyspnea    sob  realted to allergies and cough   Elevated cholesterol    on meds   GERD (gastroesophageal reflux disease)    on meds   Headache    Hiatal hernia    High blood pressure    on meds   Kidney cysts    Leukocytosis    idiopathic   Liver cyst    Nerve pain    L sciatic pain radiating to R   Osteoporosis    reaction to Prolia =   Peptic ulcer disease    PONV (postoperative nausea and vomiting)    Seasonal allergies     Current Outpatient Medications on File Prior to Visit  Medication Sig Dispense Refill   albuterol  (VENTOLIN  HFA) 108 (90 Base) MCG/ACT inhaler Inhale 2 puffs into the lungs every 4 (four) hours as needed for wheezing or shortness of breath. 18 g 1   azelastine  (ASTELIN ) 0.1 % nasal spray Place 1 spray into both nostrils 2 (two) times daily. 30 mL 5   benzonatate  (TESSALON ) 100 MG capsule TAKE 1 OR 2 CAPSULES BY MOUTH TWO TIMES A DAY AS NEEDED FOR COUGH 30 capsule 2   gabapentin  (NEURONTIN ) 100 MG capsule TAKE 1 CAPSULE BY MOUTH AT BEDTIME 30 capsule 9   hydrochlorothiazide  (MICROZIDE ) 12.5 MG capsule TAKE 1 CAPSULE BY MOUTH DAILY 90 capsule 3   hydrOXYzine  (ATARAX ) 10 MG tablet TAKE  1 TABLET BY MOUTH EVERY 6 HOURS 60 tablet 2   loratadine  (CLARITIN ) 10 MG tablet Take 10 mg by mouth 2 (two) times daily.     losartan  (COZAAR ) 50 MG tablet TAKE 1 TABLET BY MOUTH 2 TIMES A DAY FOR BLOOD PRESSURE 180 tablet 1   methocarbamol  (ROBAXIN ) 500 MG tablet Take 500 mg by mouth 4 (four) times daily.     metoprolol  tartrate (LOPRESSOR ) 25 MG tablet TAKE 1 TABLET BY MOUTH 2 TIMES A DAY 180 tablet 1   montelukast  (SINGULAIR ) 10 MG tablet TAKE 1 TABLET BY MOUTH AT BEDTIME 90 tablet 3   nitroGLYCERIN  (NITROSTAT ) 0.4 MG SL tablet Place 1 tablet (0.4 mg total) under the tongue every 5 (five) minutes as needed for chest pain. 15 tablet 1   omeprazole  (PRILOSEC) 40 MG capsule Take 1 capsule (40 mg total) by mouth 2 (two) times daily. TAKE 1 CAPSULE BY MOUTH TWO TIMES A DAY AS NEEDED FOR ACID  REFLUX 180 capsule 1   ondansetron  (ZOFRAN ) 4 MG tablet Take 1 tablet (4 mg total) by mouth every 8 (eight) hours as needed for nausea or vomiting. 20 tablet 0   pregabalin  (LYRICA ) 75 MG capsule Take 75 mg by mouth 2 (two) times daily.     ramelteon  (ROZEREM ) 8 MG tablet Take 1 tablet (8 mg total) by mouth at bedtime as needed for sleep. 30 tablet 2   Spacer/Aero-Holding Chambers (PRO COMFORT SPACER ADULT) MISC 1 Device by Does not apply route as needed. 1 each 1   traMADol  (ULTRAM ) 50 MG tablet 1 tabs po q6 hours prn moderate pain     No current facility-administered medications on file prior to visit.    Allergies  Allergen Reactions   Demerol Hcl [Meperidine] Anaphylaxis and Palpitations    flushed   Hydrocodone-Acetaminophen  Other (See Comments)   Ibuprofen Other (See Comments)    Stomach ulcers.   Nsaids Other (See Comments)    Peptic ulcers   Scar Care [Cream Base]     Redness and heat   Simvastatin      Muscle Pain   Tape Other (See Comments)    Sometimes removes skin   Meperidine Hcl Other (See Comments) and Rash   Percocet [Oxycodone -Acetaminophen ] Itching and Rash   Prolia  [Denosumab ] Rash   Vicodin [Hydrocodone-Acetaminophen ] Itching   Wound Dressing Adhesive Rash    Assessment/Plan:  1. Hyperlipidemia - Patient's last LDL of 129 is above goal of <70.  Intolerant to statins and ezetimibe . Due to underlying CAD, recommend initation of PCSK9i.  Using demo pen, educated patient on mechasnim of action, sotrage, site selection, administration, and possible adverse effects. Patient voiced understanding but is nervous to start before her surgery. Requests we run PA in January 2026.  Start Repatha/Praluent q 2 weeks in Jan 2026 Recheck lipid panel in 2-3 months  Medford Bolk, PharmD, BCACP, CDCES, CPP [facility] [address], [place], [place] [address] Phone: [phone]; Fax: [phone] 04/16/2024 4:57 PM

## 2024-04-16 NOTE — Telephone Encounter (Signed)
 This encounter was created in error - please disregard.

## 2024-04-16 NOTE — Patient Instructions (Addendum)
 Medication Instructions:  Your physician recommends that you continue on your current medications as directed. Please refer to the Current Medication list given to you today. *If you need a refill on your cardiac medications before your next appointment, please call your pharmacy*  Lab Work: None Ordered If you have labs (blood work) drawn today and your tests are completely normal, you will receive your results only by: MyChart Message (if you have MyChart) OR A paper copy in the mail If you have any lab test that is abnormal or we need to change your treatment, we will call you to review the results.  Testing/Procedures: None Ordered  Follow-Up: At [facility], you and your health needs are our priority.  As part of our continuing mission to provide you with exceptional heart care, our providers are all part of one team.  This team includes your primary Cardiologist (physician) and Advanced Practice Providers or APPs (Physician Assistants and Nurse Practitioners) who all work together to provide you with the care you need, when you need it.  Your next appointment:   6 month(s)  Provider:   Vina Gull, MD or APP   We recommend signing up for the patient portal called MyChart.  Sign up information is provided on this After Visit Summary.  MyChart is used to connect with patients for Virtual Visits (Telemedicine).  Patients are able to view lab/test results, encounter notes, upcoming appointments, etc.  Non-urgent messages can be sent to your provider as well.   To learn more about what you can do with MyChart, go to [email or web address].   Other Instructions You have been referred to Pharm-D (lipids)

## 2024-04-16 NOTE — Patient Instructions (Signed)
 It was nice meeting you today  We would like your LDL (bad cholesterol) to be less than 70  The medication we discussed is called Repatha, which is an injection you would take once every 2 weeks. I will complete the prior authorization for you in January 2026  Before hand I recommend fruits and vegetables, healthy fats, and whole grains  You can also try plant sterols as a supplement  I will reach out to you in January with the result of the prior authorization  If you do start the medication we would recheck your fasting lipid panel in about 3 months  Medford Bolk, PharmD, BCACP, CDCES, CPP [facility] [address], [place], [place] [address] Phone: [phone]; Fax: [phone] 04/16/2024 1:29 PM

## 2024-04-18 ENCOUNTER — Other Ambulatory Visit (HOSPITAL_COMMUNITY): Payer: Self-pay

## 2024-04-18 ENCOUNTER — Telehealth: Payer: Self-pay | Admitting: Pharmacy Technician

## 2024-04-18 NOTE — Telephone Encounter (Signed)
   Pharmacy Patient Advocate Encounter   Received notification from Pt Calls Messages that prior authorization for REPATHA is required/requested.   Insurance verification completed.   The patient is insured through [facility] .   Per test claim: PA required; PA submitted to above mentioned insurance via Latent Key/confirmation #/EOC [id number] Status is pending

## 2024-04-18 NOTE — Telephone Encounter (Signed)
 Pharmacy Patient Advocate Encounter  Received notification from [facility] that Prior Authorization for REPATHA has been APPROVED from 08/03/23 to 08/01/24. Ran test claim, Copay is $144.24. This test claim was processed through [facility]- copay amounts may vary at other pharmacies due to pharmacy/plan contracts, or as the patient moves through the different stages of their insurance plan.   PA #/Case ID/Reference #: [id number]

## 2024-04-19 DIAGNOSIS — M5416 Radiculopathy, lumbar region: Secondary | ICD-10-CM | POA: Diagnosis not present

## 2024-04-25 DIAGNOSIS — M545 Low back pain, unspecified: Secondary | ICD-10-CM | POA: Diagnosis not present

## 2024-04-25 DIAGNOSIS — M961 Postlaminectomy syndrome, not elsewhere classified: Secondary | ICD-10-CM | POA: Diagnosis not present

## 2024-04-26 ENCOUNTER — Encounter: Payer: Self-pay | Admitting: Family Medicine

## 2024-04-26 ENCOUNTER — Ambulatory Visit: Payer: Self-pay

## 2024-04-26 ENCOUNTER — Ambulatory Visit (INDEPENDENT_AMBULATORY_CARE_PROVIDER_SITE_OTHER): Admitting: Family Medicine

## 2024-04-26 VITALS — BP 111/73 | HR 79 | Temp 98.4°F | Resp 16 | Ht 66.0 in | Wt 188.0 lb

## 2024-04-26 DIAGNOSIS — R21 Rash and other nonspecific skin eruption: Secondary | ICD-10-CM | POA: Diagnosis not present

## 2024-04-26 MED ORDER — TRIAMCINOLONE ACETONIDE 0.1 % EX CREA: 1.0000 | TOPICAL_CREAM | Freq: Two times a day (BID) | CUTANEOUS | 0 refills | Status: AC

## 2024-04-26 MED ORDER — PREDNISONE 20 MG PO TABS: 40.0000 mg | ORAL_TABLET | Freq: Every day | ORAL | 0 refills | Status: AC

## 2024-04-26 MED ORDER — FAMOTIDINE 20 MG PO TABS: 20.0000 mg | ORAL_TABLET | Freq: Two times a day (BID) | ORAL | 1 refills | Status: DC

## 2024-04-26 NOTE — Patient Instructions (Addendum)
 Pepcid  daily or twice daily.  Dove soap-body wash but nonscented

## 2024-04-26 NOTE — Telephone Encounter (Signed)
 Called and spoke with patient to let her know Repatha was approved. Offered to sign patient up for [facility] grant due to elevated copay but patient is worried she is currently having a Shingles flare up and would like that to resolve first. Patient will call back or message when she is ready to pursue treatment.

## 2024-04-26 NOTE — Telephone Encounter (Signed)
 FYI Only or Action Required?: FYI only for provider.  Patient was last seen in primary care on 04/11/2024 by Allwardt, Mardy HERO, PA-C.  Called Nurse Triage reporting Rash.  Symptoms began several weeks ago.  Interventions attempted: Other: hydroxyzine .  Symptoms are: gradually worsening.  Triage Disposition: See Physician Within 24 Hours  Patient/caregiver understands and will follow disposition?: Yes Reason for Disposition  SEVERE itching (i.e., interferes with sleep, normal activities or school)  Answer Assessment - Initial Assessment Questions 1. APPEARANCE of RASH: What does the rash look like?      Bumps  2. LOCATION: Where is the rash located?      2 bumps below belly button x 2 weeks. Under both breasts and left arm this morning.   3. ONSET: When did the rash start?      2 weeks  4. ITCHING: Does the rash itch? If Yes, ask: How bad is the itch?  (Scale 1-10; or mild, moderate, severe)     Severe  5. PAIN: Does the rash hurt? If Yes, ask: How bad is the pain?  (Scale 0-10; or none, mild, moderate, severe)     Denies  Protocols used: Rash or Redness - Widespread-A-AH Copied from CRM [id number]. Topic: Clinical - Red Word Triage >> Apr 26, 2024 10:34 AM Harlene ORN wrote: Red Word that prompted transfer to Nurse Triage: Patient believes if she is showing beginning stages of Shingles.  Itchy rash on her belly button and under her breast and has spread to her left arm. Rapidly spreading.

## 2024-04-26 NOTE — Progress Notes (Signed)
 Subjective:     Patient ID: Tracy Mckenzie, female    DOB: 1956-04-21, 68 y.o.   MRN: [id number]  Chief Complaint  Patient presents with   Rash    Rash on mid abdomen that started over 1 week ago, under breast started several days ago, on left arm started today, red and itchy    HPI Discussed the use of AI scribe software for clinical note transcription with the patient, who gave verbal consent to proceed.  History of Present Illness Tracy Mckenzie is a 68 year old female who presents with a rash.  Approximately one week ago, she developed a rash that began as two small bumps on her abdomen. The rash has since spread under both breasts, to her left arm, shoulder, and other areas, and is described as itchy. She denies fever, chills, or shortness of breath.  She has a history of chronic cough lasting six years, which has been evaluated by specialists. She denies any recent surgeries, new medications, or recent antibiotic use. She mentions a change in body wash due to unavailability of her usual product, but has since used a hypoallergenic body wash without noticing any worsening of the rash post-shower.  She has significant allergies, including reactions to narcotics, NSAIDs, and adhesives. She has experienced ulcers from NSAIDs and had a reaction to Prolia . She reports taking hydroxyzine  at least once a day, particularly when she takes pain medication for her back issues. She also takes tramadol  and methocarbamol  for pain management, and gabapentin  and pregabalin  for neuropathy and back pain.  She has not traveled out of the country recently and has not introduced any new foods or drinks. She has not received any new immunizations. She has a history of endometriosis and developed an allergic reaction to sun and heat exposure in the past, which would cause her to break out in a rash. However, she has not been exposed to the sun recently.  Her current medications include Xyzal in the  morning, Claritin , and Singulair  at night.    There are no preventive care reminders to display for this patient.  Past Medical History:  Diagnosis Date   Allergy    Anemia    Arthritis    on meds   Bleeding ulcer    Cataract    recently dx-bilaterally   Dyspnea    sob realted to allergies and cough   Elevated cholesterol    on meds   GERD (gastroesophageal reflux disease)    on meds   Headache    Hiatal hernia    High blood pressure    on meds   Kidney cysts    Leukocytosis    idiopathic   Liver cyst    Nerve pain    L sciatic pain radiating to R   Osteoporosis    reaction to Prolia =   Peptic ulcer disease    PONV (postoperative nausea and vomiting)    Seasonal allergies     Past Surgical History:  Procedure Laterality Date   APPENDECTOMY  1992   BREAST CYST ASPIRATION Left 2003   COLONOSCOPY  2014   [facility]   FRACTURE SURGERY  06/11/2000   KNEE ARTHROSCOPY Right 04/12/2001   torn cartilage   LAPAROSCOPY  1981   for endometriosis   LEG SURGERY Right 11/23/2000   to remove screws   LEG SURGERY Right 02/08/2002   to remove rod   LUMBAR LAMINECTOMY/DECOMPRESSION MICRODISCECTOMY N/A 05/26/2023   Procedure: Lumbar four-five, Lumbar five-Sacral one Decompression;  Surgeon: Burnetta Aures, MD;  Location: [facility];  Service: Orthopedics;  Laterality: N/A;  3 C-Bed   MENISCUS REPAIR  01/29/2003   TIBIA FRACTURE SURGERY Right 06/12/2000   Multiple surgeries   TONSILLECTOMY  1967   TOTAL ABDOMINAL HYSTERECTOMY  1992   TOTAL KNEE ARTHROPLASTY Right 02/14/2023   Procedure: TOTAL KNEE ARTHROPLASTY;  Surgeon: Melodi Lerner, MD;  Location: [facility] ORS;  Service: Orthopedics;  Laterality: Right;   WISDOM TOOTH EXTRACTION       Current Outpatient Medications:    albuterol  (VENTOLIN  HFA) 108 (90 Base) MCG/ACT inhaler, Inhale 2 puffs into the lungs every 4 (four) hours as needed for wheezing or shortness of breath., Disp: 18 g, Rfl: 1   azelastine  (ASTELIN ) 0.1 %  nasal spray, Place 1 spray into both nostrils 2 (two) times daily., Disp: 30 mL, Rfl: 5   benzonatate  (TESSALON ) 100 MG capsule, TAKE 1 OR 2 CAPSULES BY MOUTH TWO TIMES A DAY AS NEEDED FOR COUGH, Disp: 30 capsule, Rfl: 2   famotidine  (PEPCID ) 20 MG tablet, Take 1 tablet (20 mg total) by mouth 2 (two) times daily., Disp: 60 tablet, Rfl: 1   gabapentin  (NEURONTIN ) 100 MG capsule, TAKE 1 CAPSULE BY MOUTH AT BEDTIME, Disp: 30 capsule, Rfl: 9   hydrochlorothiazide  (MICROZIDE ) 12.5 MG capsule, TAKE 1 CAPSULE BY MOUTH DAILY, Disp: 90 capsule, Rfl: 3   hydrOXYzine  (ATARAX ) 10 MG tablet, TAKE 1 TABLET BY MOUTH EVERY 6 HOURS, Disp: 60 tablet, Rfl: 2   loratadine  (CLARITIN ) 10 MG tablet, Take 10 mg by mouth 2 (two) times daily., Disp: , Rfl:    losartan  (COZAAR ) 50 MG tablet, TAKE 1 TABLET BY MOUTH 2 TIMES A DAY FOR BLOOD PRESSURE, Disp: 180 tablet, Rfl: 1   methocarbamol  (ROBAXIN ) 500 MG tablet, Take 500 mg by mouth 4 (four) times daily., Disp: , Rfl:    metoprolol  tartrate (LOPRESSOR ) 25 MG tablet, TAKE 1 TABLET BY MOUTH 2 TIMES A DAY, Disp: 180 tablet, Rfl: 1   montelukast  (SINGULAIR ) 10 MG tablet, TAKE 1 TABLET BY MOUTH AT BEDTIME, Disp: 90 tablet, Rfl: 3   nitroGLYCERIN  (NITROSTAT ) 0.4 MG SL tablet, Place 1 tablet (0.4 mg total) under the tongue every 5 (five) minutes as needed for chest pain., Disp: 15 tablet, Rfl: 1   omeprazole  (PRILOSEC) 40 MG capsule, Take 1 capsule (40 mg total) by mouth 2 (two) times daily. TAKE 1 CAPSULE BY MOUTH TWO TIMES A DAY AS NEEDED FOR ACID REFLUX, Disp: 180 capsule, Rfl: 1   ondansetron  (ZOFRAN ) 4 MG tablet, Take 1 tablet (4 mg total) by mouth every 8 (eight) hours as needed for nausea or vomiting., Disp: 20 tablet, Rfl: 0   predniSONE  (DELTASONE ) 20 MG tablet, Take 2 tablets (40 mg total) by mouth daily with breakfast for 5 days., Disp: 10 tablet, Rfl: 0   pregabalin  (LYRICA ) 75 MG capsule, Take 75 mg by mouth 2 (two) times daily., Disp: , Rfl:    ramelteon  (ROZEREM ) 8 MG  tablet, Take 1 tablet (8 mg total) by mouth at bedtime as needed for sleep., Disp: 30 tablet, Rfl: 2   Spacer/Aero-Holding Chambers (PRO COMFORT SPACER ADULT) MISC, 1 Device by Does not apply route as needed., Disp: 1 each, Rfl: 1   traMADol  (ULTRAM ) 50 MG tablet, 1 tabs po q6 hours prn moderate pain, Disp: , Rfl:    triamcinolone  cream (KENALOG ) 0.1 %, Apply 1 Application topically 2 (two) times daily., Disp: 30 g, Rfl: 0  Allergies  Allergen Reactions  Demerol Hcl [Meperidine] Anaphylaxis and Palpitations    flushed   Hydrocodone-Acetaminophen  Other (See Comments)   Ibuprofen Other (See Comments)    Stomach ulcers.   Nsaids Other (See Comments)    Peptic ulcers   Scar Care [Cream Base]     Redness and heat   Simvastatin      Muscle Pain   Tape Other (See Comments)    Sometimes removes skin   Meperidine Hcl Other (See Comments) and Rash   Percocet [Oxycodone -Acetaminophen ] Itching and Rash   Prolia  [Denosumab ] Rash   Vicodin [Hydrocodone-Acetaminophen ] Itching   Wound Dressing Adhesive Rash   ROS neg/noncontributory except as noted HPI/below      Objective:     BP 111/73   Pulse 79   Temp 98.4 F (36.9 C) (Temporal)   Resp 16   Ht 5' 6 (1.676 m)   Wt 188 lb (85.3 kg)   SpO2 95%   BMI 30.34 kg/m  Wt Readings from Last 3 Encounters:  04/26/24 188 lb (85.3 kg)  04/16/24 189 lb (85.7 kg)  04/11/24 189 lb 9.6 oz (86 kg)    Physical Exam   Gen: WDWN NAD HEENT: NCAT, conjunctiva not injected, sclera nonicteric LUNGS: CTAB. No wheezes EXT:  no edema MSK: no gross abnormalities.  NEURO: A&O x3.  CN II-XII intact.  PSYCH: normal mood. Good eye contact  Skin:  fine, pink, macular rash L ant shoulder, upper arm, under both breasts, most of abd.  Few hives as well.  Nothing on R arm, back, legs.      Assessment & Plan:  Rash  Other orders -     Famotidine ; Take 1 tablet (20 mg total) by mouth 2 (two) times daily.  Dispense: 60 tablet; Refill: 1 -      Triamcinolone  Acetonide; Apply 1 Application topically 2 (two) times daily.  Dispense: 30 g; Refill: 0 -     predniSONE ; Take 2 tablets (40 mg total) by mouth daily with breakfast for 5 days.  Dispense: 10 tablet; Refill: 0  Assessment and Plan Assessment & Plan Generalized pruritic rash, likely drug-induced or allergic   A generalized pruritic rash has been present for about a week, initially appearing on the abdomen, then under the breasts, and now on the left arm. The rash is itchy and spreading. Differential diagnosis includes drug-induced rash, allergic reaction, or less likely, shingles, which is ruled out due to distribution and appearance. Current medications, including tramadol  and methocarbamol  for back pain, are considered potential causes. Previous allergic reactions to medications are noted. Steroid treatment is considered, but may elevate white blood cell count and only temporarily resolve the rash. Start famotidine  (Pepcid ) twice a day as an antihistamine. Prescribe prednisone  for short-term use. Use triamcinolone  cream for spot treatment. Advise using non-scented Dove soap for bathing and avoid new immunizations until the rash resolves.  Chronic back pain   Chronic back pain is managed with tramadol  and methocarbamol . Pain management is complicated by the potential drug-induced rash. Insurance has not approved back surgery, but a spine stimulator is being considered. Pain medications are necessary for sleep and daily functioning. Consider a trial of methocarbamol  alone to assess for potential drug-induced rash. Consider a spine stimulator for pain management.  Idiopathic leukocytosis   Idiopathic leukocytosis is present with an elevated white blood cell count. Steroid use has previously increased the white blood cell count. Hematology evaluation has ruled out blood cancer.will do short term pred.    Return if symptoms worsen or  fail to improve.  Jenkins CHRISTELLA Carrel, MD

## 2024-04-26 NOTE — Telephone Encounter (Signed)
 Noted

## 2024-05-01 ENCOUNTER — Ambulatory Visit: Admitting: Pulmonary Disease

## 2024-05-03 DIAGNOSIS — M47896 Other spondylosis, lumbar region: Secondary | ICD-10-CM | POA: Diagnosis not present

## 2024-05-03 DIAGNOSIS — M5416 Radiculopathy, lumbar region: Secondary | ICD-10-CM | POA: Diagnosis not present

## 2024-05-03 DIAGNOSIS — Z4889 Encounter for other specified surgical aftercare: Secondary | ICD-10-CM | POA: Diagnosis not present

## 2024-05-03 NOTE — Telephone Encounter (Signed)
 Joan's note from 04/11/24 was faxed to [facility].

## 2024-06-25 ENCOUNTER — Other Ambulatory Visit: Payer: Self-pay | Admitting: Family Medicine

## 2024-06-25 ENCOUNTER — Other Ambulatory Visit: Payer: Self-pay

## 2024-06-25 MED ORDER — FAMOTIDINE 20 MG PO TABS: 20.0000 mg | ORAL_TABLET | Freq: Two times a day (BID) | ORAL | 1 refills | Status: AC

## 2024-07-24 DIAGNOSIS — M5126 Other intervertebral disc displacement, lumbar region: Secondary | ICD-10-CM | POA: Diagnosis not present

## 2024-07-24 DIAGNOSIS — M961 Postlaminectomy syndrome, not elsewhere classified: Secondary | ICD-10-CM | POA: Diagnosis not present

## 2024-07-24 DIAGNOSIS — M5416 Radiculopathy, lumbar region: Secondary | ICD-10-CM | POA: Diagnosis not present

## 2024-07-24 DIAGNOSIS — M21372 Foot drop, left foot: Secondary | ICD-10-CM | POA: Diagnosis not present

## 2024-08-13 ENCOUNTER — Ambulatory Visit (INDEPENDENT_AMBULATORY_CARE_PROVIDER_SITE_OTHER): Admitting: Physician Assistant

## 2024-08-13 ENCOUNTER — Encounter: Payer: Self-pay | Admitting: Physician Assistant

## 2024-08-13 VITALS — BP 136/82 | HR 70 | Temp 97.6°F | Ht 66.0 in | Wt 185.6 lb

## 2024-08-13 DIAGNOSIS — K219 Gastro-esophageal reflux disease without esophagitis: Secondary | ICD-10-CM | POA: Diagnosis not present

## 2024-08-13 DIAGNOSIS — F4321 Adjustment disorder with depressed mood: Secondary | ICD-10-CM

## 2024-08-13 DIAGNOSIS — R002 Palpitations: Secondary | ICD-10-CM

## 2024-08-13 DIAGNOSIS — R7303 Prediabetes: Secondary | ICD-10-CM | POA: Diagnosis not present

## 2024-08-13 DIAGNOSIS — I10 Essential (primary) hypertension: Secondary | ICD-10-CM

## 2024-08-13 DIAGNOSIS — R053 Chronic cough: Secondary | ICD-10-CM

## 2024-08-13 DIAGNOSIS — M5416 Radiculopathy, lumbar region: Secondary | ICD-10-CM

## 2024-08-13 MED ORDER — BENZONATATE 100 MG PO CAPS: ORAL_CAPSULE | ORAL | 2 refills | Status: AC

## 2024-08-13 MED ORDER — OMEPRAZOLE 40 MG PO CPDR: 40.0000 mg | DELAYED_RELEASE_CAPSULE | Freq: Two times a day (BID) | ORAL | 1 refills | Status: AC

## 2024-08-13 MED ORDER — AZELASTINE HCL 0.1 % NA SOLN: 1.0000 | Freq: Two times a day (BID) | NASAL | 5 refills | Status: AC

## 2024-08-13 MED ORDER — ONDANSETRON HCL 4 MG PO TABS: 4.0000 mg | ORAL_TABLET | Freq: Three times a day (TID) | ORAL | 0 refills | Status: AC | PRN

## 2024-08-13 MED ORDER — GABAPENTIN 100 MG PO CAPS: 100.0000 mg | ORAL_CAPSULE | Freq: Every day | ORAL | 3 refills | Status: AC

## 2024-08-13 MED ORDER — METOPROLOL TARTRATE 25 MG PO TABS: 25.0000 mg | ORAL_TABLET | Freq: Two times a day (BID) | ORAL | 1 refills | Status: AC

## 2024-08-13 MED ORDER — NITROGLYCERIN 0.4 MG SL SUBL: 0.4000 mg | SUBLINGUAL_TABLET | SUBLINGUAL | 1 refills | Status: AC | PRN

## 2024-08-13 NOTE — Patient Instructions (Signed)
" °  VISIT SUMMARY: Today, we discussed the management of your foot drop, chronic cough, hypertension, palpitations, and prediabetes. We also addressed your sleep disturbances and the emotional impact of your husband's passing.  YOUR PLAN: CHRONIC COUGH: You have a chronic cough that is managed with benzonatate  as needed. -Continue taking benzonatate  as needed for cough management.  ESSENTIAL HYPERTENSION: Your blood pressure is generally well-controlled with your current medications, but you had an elevated reading this morning, likely due to stress and grief. -Continue taking hydrochlorothiazide , losartan , and metoprolol  as prescribed. -Monitor your blood pressure regularly at home.  FOOT DROP: Your surgery for foot drop was postponed, and you are currently managing it with a brace. -Obtain and use a brace for foot drop management.  PALPITATIONS: You experience palpitations that are managed with nitroglycerin  as needed. -Continue using nitroglycerin  as needed for palpitations.  PREDIABETES: You have borderline A1c levels indicating prediabetes. -Monitor your sugar intake and maintain a healthy diet. -We will re-evaluate your A1c levels in three months.                      Contains text generated by Abridge.                                 Contains text generated by Abridge.   "

## 2024-08-13 NOTE — Progress Notes (Signed)
 "   Patient ID: Tracy Mckenzie, female    DOB: August 18, 1955, 69 y.o.   MRN: [id number]   Assessment & Plan:  Lumbar radiculopathy  Gastroesophageal reflux disease without esophagitis -     Omeprazole ; Take 1 capsule (40 mg total) by mouth 2 (two) times daily. TAKE 1 CAPSULE BY MOUTH TWO TIMES A DAY AS NEEDED FOR ACID REFLUX  Dispense: 180 capsule; Refill: 1  Chronic cough -     Gabapentin ; Take 1 capsule (100 mg total) by mouth at bedtime.  Dispense: 90 capsule; Refill: 3 -     Azelastine  HCl; Place 1 spray into both nostrils 2 (two) times daily.  Dispense: 30 mL; Refill: 5  Essential hypertension  Palpitations  Prediabetes  Grief  Other orders -     Ondansetron  HCl; Take 1 tablet (4 mg total) by mouth every 8 (eight) hours as needed for nausea or vomiting.  Dispense: 20 tablet; Refill: 0 -     Nitroglycerin ; Place 1 tablet (0.4 mg total) under the tongue every 5 (five) minutes as needed for chest pain.  Dispense: 15 tablet; Refill: 1 -     Metoprolol  Tartrate; Take 1 tablet (25 mg total) by mouth 2 (two) times daily.  Dispense: 180 tablet; Refill: 1 -     Benzonatate ; TAKE 1 OR 2 CAPSULES BY MOUTH TWO TIMES A DAY AS NEEDED FOR COUGH  Dispense: 30 capsule; Refill: 2      Assessment and Plan Assessment & Plan Chronic cough Managed with benzonatate  as needed, especially during exacerbations or when anticipating coughing situations. - Prescribed benzonatate  as needed for cough management.  Essential hypertension Blood pressure is well-controlled with hydrochlorothiazide , losartan , and metoprolol . Recent elevated readings may be related to stress and grief. - Continue current antihypertensive regimen: hydrochlorothiazide , losartan , and metoprolol . - Monitor blood pressure regularly at home.  Foot drop, lumbar radiculopathy Surgery was postponed due to personal circumstances and insurance issues. Currently managing with a brace. - Obtain brace for foot drop  management.  Palpitations Managed with nitroglycerin  as needed. Given by medical provider in the distant past per patient. Has not needed to use them much.  - Continue nitroglycerin  as needed for palpitations.  Prediabetes Borderline A1c levels. Advised to monitor sugar intake and maintain a healthy diet. - Advised dietary modifications to reduce sugar intake. - Will re-evaluate A1c in three months. Lab Results  Component Value Date   HGBA1C 6.2 04/12/2024   HGBA1C 5.8 01/03/2023   HGBA1C 5.6 07/02/2021   Grief - loss of husband in Oct 2025. Managing well. Will consider grief counseling.       Return in about 3 months (around 11/11/2024) for recheck/follow-up, fasting labs .    Subjective:    Chief Complaint  Patient presents with   Medical Management of Chronic Issues    Pt in office for 4 mon follow up; pt reports recent scheduled surgery wasn't completed due to insurance denial; pt states past few months have been hard since husband passed away with stroke; pt states foot drop has got worse and now using cane more often; getting brace from [facility] tomorrow. Pt also reports needing pain meds more often. Going back to spine surgery in March insurance has now approved the procedure but patient states not mentally able to do procedure right now.     HPI Discussed the use of AI scribe software for clinical note transcription with the patient, who gave verbal consent to proceed.  History of Present Illness Tracy Musa  Mckenzie is a 69 year old female who presents for follow-up and medication management for foot drop.  Her surgery for foot drop was postponed due to her husband's passing after a stroke in October. She is planning to use a brace for her foot drop and is taking medications including Lyrica  and gabapentin . She was previously on 300 mg of gabapentin  for a cough, but her current provider cannot prescribe both Lyrica  and gabapentin . She has been taking 100 mg  gabapentin  and uses benzonatate  for her cough, which she takes when her cough is severe or during situations where she needs to avoid coughing.  She experiences sleep disturbances, having difficulty falling asleep unless extremely tired. Her bedtime medications include gabapentin , Lyrica , very occasional and rare (not all together, depends on symptoms for the day) -  tramadol , methocarbamol , and hydroxyzine . Tramadol  sometimes causes grogginess but does not aid in sleep.  She noted an elevated blood pressure reading this morning. Her antihypertensive regimen includes hydrochlorothiazide , losartan , and metoprolol . She also takes a supplement called Cholestoff for cholesterol management.  She is experiencing significant grief following her husband's death, impacting her mental and emotional well-being. She is concerned about her daughter Eleanor, who is introverted and not expressing her emotions.     Past Medical History:  Diagnosis Date   Allergy    Anemia    Arthritis    on meds   Bleeding ulcer    Cataract    recently dx-bilaterally   Dyspnea    sob realted to allergies and cough   Elevated cholesterol    on meds   GERD (gastroesophageal reflux disease)    on meds   Headache    Hiatal hernia    High blood pressure    on meds   Kidney cysts    Leukocytosis    idiopathic   Liver cyst    Nerve pain    L sciatic pain radiating to R   Osteoporosis    reaction to Prolia =   Peptic ulcer disease    PONV (postoperative nausea and vomiting)    Seasonal allergies     Past Surgical History:  Procedure Laterality Date   APPENDECTOMY  1992   BREAST CYST ASPIRATION Left 2003   COLONOSCOPY  2014   [facility]-Sup[facility]   FRACTURE SURGERY  06/11/2000   KNEE ARTHROSCOPY Right 04/12/2001   torn cartilage   LAPAROSCOPY  1981   for endometriosis   LEG SURGERY Right 11/23/2000   to remove screws   LEG SURGERY Right 02/08/2002   to remove rod   LUMBAR LAMINECTOMY/DECOMPRESSION  MICRODISCECTOMY N/A 05/26/2023   Procedure: Lumbar four-five, Lumbar five-Sacral one Decompression;  Surgeon: Burnetta Aures, MD;  Location: [facility] OR;  Service: Orthopedics;  Laterality: N/A;  3 C-Bed   MENISCUS REPAIR  01/29/2003   TIBIA FRACTURE SURGERY Right 06/12/2000   Multiple surgeries   TONSILLECTOMY  1967   TOTAL ABDOMINAL HYSTERECTOMY  1992   TOTAL KNEE ARTHROPLASTY Right 02/14/2023   Procedure: TOTAL KNEE ARTHROPLASTY;  Surgeon: Melodi Lerner, MD;  Location: [facility];  Service: Orthopedics;  Laterality: Right;   WISDOM TOOTH EXTRACTION      Family History  Problem Relation Age of Onset   Kidney disease Mother    COPD Mother    Hearing loss Mother    Heart disease Mother    Hypertension Mother    Miscarriages / Stillbirths Mother    Obesity Mother    Vision loss Mother    COPD Father  Breast cancer Sister 1   Breast cancer Maternal Aunt    Breast cancer Paternal Aunt    Diabetes Maternal Grandfather    Heart disease Maternal Grandfather    Stroke Paternal Grandfather    Diabetes Sister    Obesity Sister    Colon cancer Neg Hx    Colon polyps Neg Hx    Esophageal cancer Neg Hx    Rectal cancer Neg Hx    Stomach cancer Neg Hx     Social History[1]   Allergies[2]  Review of Systems NEGATIVE UNLESS OTHERWISE INDICATED IN HPI      Objective:     BP 136/82 (BP Location: Right Arm, Patient Position: Sitting, Cuff Size: Normal)   Pulse 70   Temp 97.6 F (36.4 C) (Temporal)   Ht 5' 6 (1.676 m)   Wt 185 lb 9.6 oz (84.2 kg)   SpO2 96%   BMI 29.96 kg/m   Wt Readings from Last 3 Encounters:  08/13/24 185 lb 9.6 oz (84.2 kg)  04/26/24 188 lb (85.3 kg)  04/16/24 189 lb (85.7 kg)    BP Readings from Last 3 Encounters:  08/13/24 136/82  04/26/24 111/73  04/16/24 102/68     Physical Exam Vitals and nursing note reviewed.  Constitutional:      Appearance: Normal appearance.  Eyes:     Extraocular Movements: Extraocular movements intact.      Conjunctiva/sclera: Conjunctivae normal.     Pupils: Pupils are equal, round, and reactive to light.  Cardiovascular:     Rate and Rhythm: Normal rate and regular rhythm.  Pulmonary:     Effort: Pulmonary effort is normal.     Breath sounds: Normal breath sounds.  Musculoskeletal:     Right lower leg: No edema.     Left lower leg: No edema.  Skin:    Findings: No lesion or rash.  Neurological:     Mental Status: She is alert.     Gait: Gait abnormal.     Comments: Using a cane   Psychiatric:        Mood and Affect: Affect is tearful.        Behavior: Behavior normal.             Adrain Butrick M Bernese Doffing, PA-C     [1]  Social History Tobacco Use   Smoking status: Never   Smokeless tobacco: Never  Vaping Use   Vaping status: Never Used  Substance Use Topics   Alcohol  use: No   Drug use: No  [2]  Allergies Allergen Reactions   Demerol Hcl [Meperidine] Anaphylaxis and Palpitations    flushed   Acetaminophen     Hydrocodone-Acetaminophen  Other (See Comments)   Ibuprofen Other (See Comments)    Stomach ulcers.   Nsaids Other (See Comments)    Peptic ulcers   Oxycodone  Hcl    Scar Care [Cream Base]     Redness and heat   Simvastatin      Muscle Pain   Tape Other (See Comments)    Sometimes removes skin   Meperidine Hcl Other (See Comments) and Rash   Percocet [Oxycodone -Acetaminophen ] Itching and Rash   Prolia  [Denosumab ] Rash   Vicodin [Hydrocodone-Acetaminophen ] Itching   Wound Dressing Adhesive Rash   "

## 2024-11-12 ENCOUNTER — Ambulatory Visit

## 2024-11-13 ENCOUNTER — Ambulatory Visit: Admitting: Physician Assistant
